# Patient Record
Sex: Male | Born: 1960 | Race: White | Hispanic: No | Marital: Single | Smoking: Never smoker
Health system: Southern US, Community
[De-identification: ages and names within clinical notes are randomized; demographics above are authoritative.]

## PROBLEM LIST (undated history)

## (undated) DIAGNOSIS — Z87442 Personal history of urinary calculi: Secondary | ICD-10-CM

## (undated) DIAGNOSIS — E785 Hyperlipidemia, unspecified: Secondary | ICD-10-CM

## (undated) DIAGNOSIS — R519 Headache, unspecified: Secondary | ICD-10-CM

## (undated) DIAGNOSIS — I219 Acute myocardial infarction, unspecified: Secondary | ICD-10-CM

## (undated) DIAGNOSIS — Z8249 Family history of ischemic heart disease and other diseases of the circulatory system: Secondary | ICD-10-CM

## (undated) DIAGNOSIS — E786 Lipoprotein deficiency: Secondary | ICD-10-CM

## (undated) DIAGNOSIS — Z9889 Other specified postprocedural states: Secondary | ICD-10-CM

## (undated) DIAGNOSIS — E119 Type 2 diabetes mellitus without complications: Secondary | ICD-10-CM

## (undated) DIAGNOSIS — I2510 Atherosclerotic heart disease of native coronary artery without angina pectoris: Secondary | ICD-10-CM

## (undated) DIAGNOSIS — R51 Headache: Secondary | ICD-10-CM

## (undated) DIAGNOSIS — R112 Nausea with vomiting, unspecified: Secondary | ICD-10-CM

## (undated) HISTORY — DX: Type 2 diabetes mellitus without complications: E11.9

## (undated) HISTORY — DX: Atherosclerotic heart disease of native coronary artery without angina pectoris: I25.10

## (undated) HISTORY — DX: Lipoprotein deficiency: E78.6

## (undated) HISTORY — PX: EYE SURGERY: SHX253

## (undated) HISTORY — PX: STOMACH SURGERY: SHX791

## (undated) HISTORY — DX: Acute myocardial infarction, unspecified: I21.9

---

## 2009-04-13 ENCOUNTER — Ambulatory Visit: Payer: Self-pay | Admitting: *Deleted

## 2012-11-05 ENCOUNTER — Ambulatory Visit: Payer: Self-pay | Admitting: Sports Medicine

## 2012-12-10 ENCOUNTER — Ambulatory Visit (INDEPENDENT_AMBULATORY_CARE_PROVIDER_SITE_OTHER): Payer: BC Managed Care – PPO | Admitting: Sports Medicine

## 2012-12-10 ENCOUNTER — Encounter: Payer: Self-pay | Admitting: Sports Medicine

## 2012-12-10 VITALS — BP 137/91 | HR 90 | Ht 71.0 in | Wt 250.0 lb

## 2012-12-10 DIAGNOSIS — M722 Plantar fascial fibromatosis: Secondary | ICD-10-CM | POA: Insufficient documentation

## 2012-12-10 DIAGNOSIS — M25571 Pain in right ankle and joints of right foot: Secondary | ICD-10-CM

## 2012-12-10 DIAGNOSIS — M25579 Pain in unspecified ankle and joints of unspecified foot: Secondary | ICD-10-CM

## 2012-12-10 NOTE — Assessment & Plan Note (Signed)
Right plantar fasciitis.  Plan: Massage, stretches Eccentric heel lifts Arch straps Heel pads Followup 4-6 weeks

## 2012-12-10 NOTE — Progress Notes (Signed)
Seth Riddle is a 52 y.o. male who presents to [facility] today for right foot heel pain for the last month or so. Patient notes that he stepped out of his truck landing awkwardly on his foot over a month ago. He denies any significant immediate pain or swelling however a week or so later he had gradual onset of pain at the medial aspect of his plantar calcaneus of his right foot. Additionally  around the same time he started a couch to 5K program.  He notes pain in the plantar calcaneus especially when he gets out of bed the morning and when he starts running. His pain improves with activity and worsens with rest. He denies any radiating pain weakness or numbness. He has tried some cushioned sandals which have helped some.    PMH reviewed.  History  Substance Use Topics  . Smoking status: Never Smoker   . Smokeless tobacco: Never Used  . Alcohol Use: Not on file   ROS as above otherwise neg   Exam:  BP 137/91  Pulse 90  Ht 5\' 11"  (1.803 m)  Wt 250 lb (113.399 kg)  BMI 34.88 kg/m2 Gen: Well NAD MSK: Right foot: Very wide with pes planus and ankle pronation with standing.  Right foot the third toe dominant Normal heel varus with toe standing Tender to palpation over the medial aspect of the plantar calcaneus Sensation and capillary refill are intact distally

## 2012-12-10 NOTE — Patient Instructions (Addendum)
Thank you for coming in today. I think you have plantar fasciitis Wear the arch straps with exercise and heel cups with exercise. Do the pull the toe back massage stretch for 10-15 minutes daily Ice the heel for 10-20 minutes daily Heel lifts 30 reps 2-3 sets daily.   Return in 4-6 weeks.

## 2013-09-10 ENCOUNTER — Ambulatory Visit: Payer: Self-pay | Admitting: Gastroenterology

## 2013-09-14 LAB — PATHOLOGY REPORT

## 2014-05-17 ENCOUNTER — Ambulatory Visit: Payer: BC Managed Care – PPO | Admitting: Sports Medicine

## 2014-08-19 DIAGNOSIS — I219 Acute myocardial infarction, unspecified: Secondary | ICD-10-CM

## 2014-08-19 HISTORY — DX: Acute myocardial infarction, unspecified: I21.9

## 2014-11-13 ENCOUNTER — Encounter (HOSPITAL_COMMUNITY): Payer: Self-pay | Admitting: Physical Medicine and Rehabilitation

## 2014-11-13 ENCOUNTER — Inpatient Hospital Stay (HOSPITAL_COMMUNITY)
Admission: EM | Admit: 2014-11-13 | Discharge: 2014-11-21 | DRG: 232 | Disposition: A | Discharge status: Home / Self Care | Payer: BC Managed Care – PPO | Attending: Thoracic Surgery (Cardiothoracic Vascular Surgery) | Admitting: Thoracic Surgery (Cardiothoracic Vascular Surgery)

## 2014-11-13 ENCOUNTER — Emergency Department (HOSPITAL_COMMUNITY): Payer: BC Managed Care – PPO

## 2014-11-13 DIAGNOSIS — Z91018 Allergy to other foods: Secondary | ICD-10-CM | POA: Diagnosis not present

## 2014-11-13 DIAGNOSIS — I2510 Atherosclerotic heart disease of native coronary artery without angina pectoris: Secondary | ICD-10-CM | POA: Diagnosis not present

## 2014-11-13 DIAGNOSIS — I2109 ST elevation (STEMI) myocardial infarction involving other coronary artery of anterior wall: Principal | ICD-10-CM | POA: Diagnosis present

## 2014-11-13 DIAGNOSIS — J811 Chronic pulmonary edema: Secondary | ICD-10-CM | POA: Diagnosis not present

## 2014-11-13 DIAGNOSIS — E784 Other hyperlipidemia: Secondary | ICD-10-CM | POA: Diagnosis not present

## 2014-11-13 DIAGNOSIS — E785 Hyperlipidemia, unspecified: Secondary | ICD-10-CM | POA: Diagnosis present

## 2014-11-13 DIAGNOSIS — E8770 Fluid overload, unspecified: Secondary | ICD-10-CM | POA: Diagnosis present

## 2014-11-13 DIAGNOSIS — J9811 Atelectasis: Secondary | ICD-10-CM | POA: Diagnosis not present

## 2014-11-13 DIAGNOSIS — Z951 Presence of aortocoronary bypass graft: Secondary | ICD-10-CM

## 2014-11-13 DIAGNOSIS — I25110 Atherosclerotic heart disease of native coronary artery with unstable angina pectoris: Secondary | ICD-10-CM | POA: Diagnosis not present

## 2014-11-13 DIAGNOSIS — I2102 ST elevation (STEMI) myocardial infarction involving left anterior descending coronary artery: Secondary | ICD-10-CM | POA: Insufficient documentation

## 2014-11-13 DIAGNOSIS — D696 Thrombocytopenia, unspecified: Secondary | ICD-10-CM | POA: Diagnosis not present

## 2014-11-13 DIAGNOSIS — R079 Chest pain, unspecified: Secondary | ICD-10-CM | POA: Diagnosis present

## 2014-11-13 DIAGNOSIS — Z978 Presence of other specified devices: Secondary | ICD-10-CM

## 2014-11-13 DIAGNOSIS — I214 Non-ST elevation (NSTEMI) myocardial infarction: Secondary | ICD-10-CM | POA: Diagnosis present

## 2014-11-13 DIAGNOSIS — I255 Ischemic cardiomyopathy: Secondary | ICD-10-CM | POA: Diagnosis present

## 2014-11-13 DIAGNOSIS — E1169 Type 2 diabetes mellitus with other specified complication: Secondary | ICD-10-CM | POA: Diagnosis present

## 2014-11-13 DIAGNOSIS — E119 Type 2 diabetes mellitus without complications: Secondary | ICD-10-CM | POA: Diagnosis present

## 2014-11-13 DIAGNOSIS — Z8249 Family history of ischemic heart disease and other diseases of the circulatory system: Secondary | ICD-10-CM

## 2014-11-13 DIAGNOSIS — Z4682 Encounter for fitting and adjustment of non-vascular catheter: Secondary | ICD-10-CM

## 2014-11-13 DIAGNOSIS — Z566 Other physical and mental strain related to work: Secondary | ICD-10-CM

## 2014-11-13 HISTORY — DX: Hyperlipidemia, unspecified: E78.5

## 2014-11-13 HISTORY — DX: Family history of ischemic heart disease and other diseases of the circulatory system: Z82.49

## 2014-11-13 LAB — D-DIMER, QUANTITATIVE: D-Dimer, Quant: 0.27 ug/mL-FEU (ref 0.00–0.48)

## 2014-11-13 LAB — TROPONIN I
TROPONIN I: 1.92 ng/mL — AB (ref <0.031)
Troponin I: 0.19 ng/mL — ABNORMAL HIGH (ref <0.031)
Troponin I: 2.35 ng/mL (ref <0.031)

## 2014-11-13 LAB — HEPARIN LEVEL (UNFRACTIONATED): Heparin Unfractionated: 0.21 IU/mL — ABNORMAL LOW (ref 0.30–0.70)

## 2014-11-13 LAB — I-STAT TROPONIN, ED: TROPONIN I, POC: 0.16 ng/mL — AB (ref 0.00–0.08)

## 2014-11-13 LAB — CBC
HEMATOCRIT: 46 % (ref 39.0–52.0)
Hemoglobin: 16 g/dL (ref 13.0–17.0)
MCH: 30.9 pg (ref 26.0–34.0)
MCHC: 34.8 g/dL (ref 30.0–36.0)
MCV: 89 fL (ref 78.0–100.0)
Platelets: 172 10*3/uL (ref 150–400)
RBC: 5.17 MIL/uL (ref 4.22–5.81)
RDW: 12.7 % (ref 11.5–15.5)
WBC: 7.2 10*3/uL (ref 4.0–10.5)

## 2014-11-13 LAB — BASIC METABOLIC PANEL
Anion gap: 9 (ref 5–15)
BUN: 12 mg/dL (ref 6–23)
CHLORIDE: 106 mmol/L (ref 96–112)
CO2: 23 mmol/L (ref 19–32)
CREATININE: 1.13 mg/dL (ref 0.50–1.35)
Calcium: 9 mg/dL (ref 8.4–10.5)
GFR calc Af Amer: 83 mL/min — ABNORMAL LOW (ref >90)
GFR calc non Af Amer: 72 mL/min — ABNORMAL LOW (ref >90)
GLUCOSE: 147 mg/dL — AB (ref 70–99)
POTASSIUM: 4.1 mmol/L (ref 3.5–5.1)
SODIUM: 138 mmol/L (ref 135–145)

## 2014-11-13 LAB — MRSA PCR SCREENING: MRSA BY PCR: NEGATIVE

## 2014-11-13 LAB — SALICYLATE LEVEL: Salicylate Lvl: <4 mg/dL (ref 2.8–20.0)

## 2014-11-13 LAB — PROTIME-INR
INR: 1.07 (ref 0.00–1.49)
Prothrombin Time: 14 seconds (ref 11.6–15.2)

## 2014-11-13 LAB — GLUCOSE, CAPILLARY: Glucose-Capillary: 138 mg/dL — ABNORMAL HIGH (ref 70–99)

## 2014-11-13 MED ORDER — ONDANSETRON HCL 4 MG/2ML IJ SOLN
4.0000 mg | Freq: Four times a day (QID) | INTRAMUSCULAR | Status: DC | PRN
Start: 2014-11-13 — End: 2014-11-14

## 2014-11-13 MED ORDER — SODIUM CHLORIDE 0.9 % IJ SOLN: 3.0000 mL | INTRAMUSCULAR | Status: DC | PRN

## 2014-11-13 MED ORDER — NITROGLYCERIN 0.4 MG SL SUBL: 0.4000 mg | SUBLINGUAL_TABLET | SUBLINGUAL | Status: DC | PRN

## 2014-11-13 MED ORDER — SODIUM CHLORIDE 0.9 % IV SOLN: 250.0000 mL | INTRAVENOUS | Status: DC | PRN

## 2014-11-13 MED ORDER — SODIUM CHLORIDE 0.9 % IV SOLN
1.0000 mL/kg/h | INTRAVENOUS | Status: DC
  Administered 2014-11-14 (×2): 1 mL/kg/h via INTRAVENOUS

## 2014-11-13 MED ORDER — ATORVASTATIN CALCIUM 80 MG PO TABS
80.0000 mg | ORAL_TABLET | Freq: Every day | ORAL | Status: DC
  Administered 2014-11-13: 80 mg via ORAL
  Filled 2014-11-13: qty 1

## 2014-11-13 MED ORDER — ACETAMINOPHEN 325 MG PO TABS
650.0000 mg | ORAL_TABLET | ORAL | Status: DC | PRN
  Administered 2014-11-14 (×2): 650 mg via ORAL
  Filled 2014-11-13 (×2): qty 2

## 2014-11-13 MED ORDER — SODIUM CHLORIDE 0.9 % IV SOLN
250.0000 mL | INTRAVENOUS | Status: DC | PRN
Start: 2014-11-13 — End: 2014-11-14

## 2014-11-13 MED ORDER — HEPARIN BOLUS VIA INFUSION
4000.0000 [IU] | Freq: Once | INTRAVENOUS | Status: AC
  Administered 2014-11-13: 4000 [IU] via INTRAVENOUS
  Filled 2014-11-13: qty 4000

## 2014-11-13 MED ORDER — ASPIRIN 300 MG RE SUPP: 300.0000 mg | RECTAL | Status: AC

## 2014-11-13 MED ORDER — TEMAZEPAM 15 MG PO CAPS
15.0000 mg | ORAL_CAPSULE | Freq: Every evening | ORAL | Status: DC | PRN
  Administered 2014-11-13 – 2014-11-20 (×6): 15 mg via ORAL
  Filled 2014-11-13 (×6): qty 1

## 2014-11-13 MED ORDER — HEPARIN BOLUS VIA INFUSION
1500.0000 [IU] | Freq: Once | INTRAVENOUS | Status: AC
  Administered 2014-11-14: 1500 [IU] via INTRAVENOUS
  Filled 2014-11-13: qty 1500

## 2014-11-13 MED ORDER — HEPARIN (PORCINE) IN NACL 100-0.45 UNIT/ML-% IJ SOLN
1400.0000 [IU]/h | INTRAMUSCULAR | Status: DC
  Administered 2014-11-13: 1400 [IU]/h via INTRAVENOUS
  Filled 2014-11-13: qty 250

## 2014-11-13 MED ORDER — ASPIRIN EC 81 MG PO TBEC: 81.0000 mg | DELAYED_RELEASE_TABLET | Freq: Every day | ORAL | Status: DC

## 2014-11-13 MED ORDER — NITROGLYCERIN 0.4 MG SL SUBL
0.4000 mg | SUBLINGUAL_TABLET | SUBLINGUAL | Status: DC | PRN
  Administered 2014-11-14 (×2): 0.4 mg via SUBLINGUAL
  Filled 2014-11-13: qty 1

## 2014-11-13 MED ORDER — METOPROLOL TARTRATE 12.5 MG HALF TABLET
12.5000 mg | ORAL_TABLET | Freq: Two times a day (BID) | ORAL | Status: DC
  Administered 2014-11-13 – 2014-11-14 (×3): 12.5 mg via ORAL
  Filled 2014-11-13 (×3): qty 1

## 2014-11-13 MED ORDER — SODIUM CHLORIDE 0.9 % IJ SOLN: 3.0000 mL | Freq: Two times a day (BID) | INTRAMUSCULAR | Status: DC

## 2014-11-13 MED ORDER — SODIUM CHLORIDE 0.9 % IJ SOLN
3.0000 mL | Freq: Two times a day (BID) | INTRAMUSCULAR | Status: DC
  Administered 2014-11-14: 3 mL via INTRAVENOUS

## 2014-11-13 MED ORDER — HEPARIN (PORCINE) IN NACL 100-0.45 UNIT/ML-% IJ SOLN
1600.0000 [IU]/h | INTRAMUSCULAR | Status: DC
  Administered 2014-11-14 (×2): 1600 [IU]/h via INTRAVENOUS
  Filled 2014-11-13: qty 250

## 2014-11-13 MED ORDER — ASPIRIN 81 MG PO CHEW: 324.0000 mg | CHEWABLE_TABLET | ORAL | Status: AC

## 2014-11-13 MED ORDER — ASPIRIN 81 MG PO CHEW
81.0000 mg | CHEWABLE_TABLET | ORAL | Status: AC
  Administered 2014-11-14: 81 mg via ORAL
  Filled 2014-11-13: qty 1

## 2014-11-13 NOTE — ED Provider Notes (Signed)
CSN: [id number]     Arrival date & time 11/13/14  0957 History   First MD Initiated Contact with Patient 11/13/14 1010     Chief Complaint  Patient presents with  . Chest Pain     (Consider location/radiation/quality/duration/timing/severity/associated sxs/prior Treatment) HPI Comments: RAMESES OU is a 54 y.o. Previously healthy male who presents to the ED with complaints of sudden onset midsternal chest pain that began at 5 AM, awakening him from sleep. He states the pain was initially 7/10 midsternal pressure-like pain that radiated to his left jaw and left arm, constant until he took a total of 655 mg of aspirin and an antacid medication which has improved his symptoms significantly, now down to a 1/10. No known aggravating factors, denies any change with inspiration, movement, or exertion. Associated symptoms include a diaphoretic feeling (patient states he felt "warm and flushed" but denies sweating), as well as left arm tingling which is also since resolved. He reports he has been belching, but denies any indigestion or heartburn, or dysphagia. He denies any recent fevers, chills, shortness of breath, cough, wheezing, hemoptysis, leg swelling, surgery, mobilization, personal history of DVT/PE, abdominal pain, nausea, vomiting, diarrhea, constipation, dysuria, hematuria, melena, hematochezia, back pain, weakness, numbness, PND, claudication, orthopnea, dizziness, or lightheadedness. patient reports a strong family history of cardiac illness, including multiple MIs in varying close family members including parents, grandparents, and siblings. He states he was in [facility] one week ago and had similar chest pain, but did not seek medical attention. He went there via airplane, stating that he was on a plane for 5.5 hours. Denies any IV drug use or ED medications.   Patient is a 54 y.o. male presenting with chest pain. The history is provided by the patient. No language interpreter was used.   Chest Pain Pain location:  Substernal area Pain quality: pressure   Pain radiates to:  L jaw and L arm Pain radiates to the back: no   Pain severity:  Moderate (7/10 at onset, 1/10 now) Onset quality:  Sudden Duration:  5 hours Timing:  Constant Progression:  Partially resolved Chronicity:  Recurrent (one similar episode last week) Context: at rest   Relieved by:  Aspirin and antacids (  ASA total) Worsened by:  Nothing tried Ineffective treatments:  None tried Associated symptoms: diaphoresis (felt warm/flushed)   Associated symptoms: no abdominal pain, no altered mental status, no anxiety, no back pain, no claudication, no cough, no dizziness, no dysphagia, no fever, no heartburn, no lower extremity edema, no nausea, no near-syncope, no numbness, no orthopnea, no palpitations, no PND, no shortness of breath, no syncope, not vomiting and no weakness   Risk factors: male sex   Risk factors: no immobilization, no prior DVT/PE, no smoking and no surgery     History reviewed. No pertinent past medical history. History reviewed. No pertinent past surgical history. No family history on file. History  Substance Use Topics  . Smoking status: Never Smoker   . Smokeless tobacco: Never Used  . Alcohol Use: Yes    Review of Systems  Constitutional: Positive for diaphoresis (felt warm/flushed). Negative for fever and chills.  HENT: Negative for trouble swallowing.   Respiratory: Negative for cough, shortness of breath and wheezing.   Cardiovascular: Positive for chest pain. Negative for palpitations, orthopnea, claudication, leg swelling, syncope, PND and near-syncope.  Gastrointestinal: Negative for heartburn, nausea, vomiting, abdominal pain, diarrhea, constipation and blood in stool.       +belching  Genitourinary: Negative for  dysuria and hematuria.  Musculoskeletal: Positive for neck pain (radiating from chest). Negative for myalgias, back pain and arthralgias.  Skin: Negative  for color change.  Neurological: Negative for dizziness, weakness, light-headedness and numbness.       +L arm tingling  Psychiatric/Behavioral: Negative for confusion.   10 Systems reviewed and are negative for acute change except as noted in the HPI.    Allergies  Review of patient's allergies indicates no known allergies.  Home Medications   Prior to Admission medications   Not on File   BP 135/82 mmHg  Pulse 80  Temp(Src) 97.6 F (36.4 C) (Oral)  Resp 18  Ht 5\' 11"  (1.803 m)  Wt 260 lb (117.935 kg)  BMI 36.28 kg/m2  SpO2 96% Physical Exam  Constitutional: He is oriented to person, place, and time. Vital signs are normal. He appears well-developed and well-nourished.  Non-toxic appearance. No distress.  Afebrile, nontoxic, NAD  HENT:  Head: Normocephalic and atraumatic.  Mouth/Throat: Oropharynx is clear and moist and mucous membranes are normal.  Eyes: Conjunctivae and EOM are normal. Right eye exhibits no discharge. Left eye exhibits no discharge.  Neck: Normal range of motion. Neck supple. No JVD present.  No JVD  Cardiovascular: Normal rate, regular rhythm, normal heart sounds and intact distal pulses.  Exam reveals no gallop and no friction rub.   No murmur heard. RRR, nl s1/s2, no m/r/g, distal pulses intact and symmetric, no pedal edema   Pulmonary/Chest: Effort normal and breath sounds normal. No respiratory distress. He has no decreased breath sounds. He has no wheezes. He has no rhonchi. He has no rales. He exhibits no tenderness, no crepitus and no retraction.  CTAB in all lung fields, no w/r/r, no hypoxia or increased WOB, speaking in full sentences, SpO2 92-96% on RA during exam No chest wall TTP or crepitus/retraction  Abdominal: Soft. Normal appearance and bowel sounds are normal. He exhibits no distension. There is no tenderness. There is no rigidity, no rebound, no guarding, no CVA tenderness, no tenderness at McBurney's point and negative Murphy's sign.   Musculoskeletal: Normal range of motion.  MAE x4 Strength and sensation grossly intact Distal pulses symmetric and intact No pedal edema, neg homan's sign bilaterally  Neurological: He is alert and oriented to person, place, and time. He has normal strength. No sensory deficit.  Skin: Skin is warm, dry and intact. No rash noted.  Psychiatric: He has a normal mood and affect.  Nursing note and vitals reviewed.   ED Course  Procedures (including critical care time)  CRITICAL CARE- NSTEMI Performed by: Ramond Marrow   Total critical care time: 30 minutes  Critical care time was exclusive of separately billable procedures and treating other patients.  Critical care was necessary to treat or prevent imminent or life-threatening deterioration.  Critical care was time spent personally by me on the following activities: development of treatment plan with patient and/or surrogate as well as nursing, discussions with consultants, evaluation of patient's response to treatment, examination of patient, obtaining history from patient or surrogate, ordering and performing treatments and interventions, ordering and review of laboratory studies, ordering and review of radiographic studies, pulse oximetry and re-evaluation of patient's condition.   Labs Review Labs Reviewed  BASIC METABOLIC PANEL - Abnormal; Notable for the following:    Glucose, Bld 147 (*)    GFR calc non Af Amer 72 (*)    GFR calc Af Amer 83 (*)    All other components within normal  limits  TROPONIN I - Abnormal; Notable for the following:    Troponin I 0.19 (*)    All other components within normal limits  I-STAT TROPOININ, ED - Abnormal; Notable for the following:    Troponin i, poc 0.16 (*)    All other components within normal limits  CBC  D-DIMER, QUANTITATIVE  SALICYLATE LEVEL  TROPONIN I    Imaging Review Dg Chest 2 View  11/13/2014   CLINICAL DATA:  Midsternal chest pain radiating to the  left arm and jaw.  EXAM: CHEST  2 VIEW  COMPARISON:  None.  FINDINGS: Normal cardiac and mediastinal contours. No consolidative pulmonary opacities. No pleural effusion or pneumothorax. Mid thoracic spine degenerative changes.  IMPRESSION: No acute cardiopulmonary process.   Electronically Signed   By: Annia Belt M.D.   On: 11/13/2014 11:26     EKG Interpretation   Date/Time:  Sunday November 13 2014 10:04:19 EDT Ventricular Rate:  79 PR Interval:  149 QRS Duration: 90 QT Interval:  363 QTC Calculation: 416 R Axis:   40 Text Interpretation:  Sinus rhythm Low voltage, precordial leads Confirmed  by ZAVITZ  MD, JOSHUA ([id number]) on 11/13/2014 11:42:44 AM      MDM   Final diagnoses:  NSTEMI (non-ST elevated myocardial infarction)  Acute chest pain    54 y.o. male here with sudden onset CP that awoke him from sleep at 5am, he took  ASA total prior to arrival, which helped relieve his symptoms down to 1/10. Significant cardiac history in family. No reproducible pain on exam. Symmetric pulses. No tachycardia but during exam, SpO2 reveals fluctuations from 92%-96%, and pt recently travelled on an airplane for 5.5hrs, no calf tenderness or swelling, but low suspicion for PE. Will obtain dimer given low well's score, if positive will proceed with CTA. EKG showing NSR with low voltage leads, no T wave changes noted. Will obtain labs, CXR, and salicylate level. Will give NTG. Will hold off on morphine since pt states his pain is nearly resolved. Discussed w/pt that he will likely need admission for cardiac rule out given his suspicious story and significant family hx of cardiac disease, but pt is hesitant due to family obligations (travelling to [place] today to visit family). He agrees to stay for delta trop at least, and then reassess at that time, although I still strongly believe pt would benefit from admission. Will reassess shortly.   11:14 AM Istat troponin elevated, will get formal troponin  but will consult cardiology now.  11:38 AM Dr. Rennis Golden of cardiology returning page, will come evaluate patient. CBC returning WNL. CXR WNL. Awaiting BMP, Dimer, salicylate, and formal troponin I.  12:25 PM BMP with mildly elevated glucose at 147. Dimer neg. Salicylate level WNL. Troponin I 0.19. Went to update pt and cardiology at bedside. Will await their recommendations. Pt stable at this time.   1:39 PM Cardiology has written a note but no admission orders placed, unclear of status of patient. Will have them repaged.  2:11 PM Cardiology to be repaged again now, still have not received call.  2:49 PM Dr. Rennis Golden returning page, states that all orders were under "sign and held" and had not yet been released, but that the pt is going to be admitted under the cardiology service. Admission order placed, please see cardiology services note for further documentation of care.  France Ravens Camprubi-Soms, PA-C 11/13/14 1450  Blane Ohara, MD 11/13/14 2207

## 2014-11-13 NOTE — H&P (Signed)
Physician History and Physical    Seth Riddle MRN: [id number] DOB/AGE: 1960/12/02 54 y.o. Admit date: 11/13/2014    HPI: The patient is a 54 yr old male with a past medical history of dyslipidemia and a family history of premature coronary artery disease. He was told his LDL is mildly elevated and his HDL is low. His mother had an MI and CABG at age 38 and his father had an MI as well. His father also has atrial fibrillation and had an ablation, and also had low HDL. His brother has atrial fibrillation requiring cardioversion.  He went to a movie with his wife last night and had popcorn. After he came home, he was belching. He went to bed and had a nightmare and slept 1.5 hours. He was then awoken by chest pain which was retrosternal and radiated into his jaw and left arm. He took 3 baby ASA and 3 antacid tablets which did not provide much relief. His wife says "He looked bad, looked like he was short of breath, and was bent over". The patient said he was just trying to take deep breaths to improve oxygenation. He denies associated nausea, palpitations, and lightheadedness.  Afterwards, he took a suitcase out to the car with continued symptoms, as he and his wife were planning on going to [facility]) to take her mother to a doctor's appointment.  He was in [facility] two weeks ago and had similar symptoms and he had also slept only 1.5 hours that night. He thought it was due to sleep deprivation and took a shower, felt better, and then walked 4-5 miles [place] convention [place] without difficulty.  He works in Occupational hygienist and stays stressed.  ECG demonstrates normal sinus rhythm and was without ischemic ST-T abnormalities.  I-stat troponin was 0.16 and initial serum troponin was 0.19. Chest xray, CBC, and d-dimer were normal.  Fam: See above (as per HPI). Parents both had MI's, mother had CABG at 33.  Soc: 3 children. Nonsmoker. Married. Works in Occupational hygienist. Grew up  with Delane Ginger.  Review of systems complete and found to be negative unless listed above   No family history of premature CAD in 1st degree relatives. History   Social History  . Marital Status: Married    Spouse Name: N/A  . Number of Children: N/A  . Years of Education: N/A   Occupational History  . Not on file.   Social History Main Topics  . Smoking status: Never Smoker   . Smokeless tobacco: Never Used  . Alcohol Use: Yes  . Drug Use: No  . Sexual Activity: Not on file   Other Topics Concern  . Not on file   Social History Narrative      (Not in a hospital admission)  Physical Exam: Blood pressure 135/82, pulse 80, temperature 97.6 F (36.4 C), temperature source Oral, resp. rate 18, height  (1.803 m), weight 260 lb (117.935 kg), SpO2 96 %.  General: NAD Neck: No JVD, no thyromegaly or thyroid nodule.  Lungs: Clear to auscultation bilaterally with normal respiratory effort. CV: Nondisplaced PMI.  Heart regular S1/S2, no S3/S4, no murmur.  No peripheral edema.  No carotid bruit.  Normal pedal pulses.  Abdomen: Soft, nontender, no hepatosplenomegaly, no distention.  Skin: Intact without lesions or rashes.  Neurologic: Alert and oriented x 3.  Psych: Normal affect. Extremities: No clubbing or cyanosis.  HEENT: Normal.   Labs:   Lab Results  Component  Value Date   WBC 7.2 11/13/2014   HGB 16.0 11/13/2014   HCT 46.0 11/13/2014   MCV 89.0 11/13/2014   PLT 172 11/13/2014    Recent Labs Lab 11/13/14 1045  NA 138  K 4.1  CL 106  CO2 23  BUN 12  CREATININE 1.13  CALCIUM 9.0  GLUCOSE 147*   Lab Results  Component Value Date   TROPONINI 0.19* 11/13/2014   No results found for: CHOL No results found for: HDL No results found for: LDLCALC No results found for: TRIG No results found for: CHOLHDL No results found for: LDLDIRECT        ASSESSMENT AND PLAN:  1. Chest pain with troponin elevation: Symptoms are very suspicious for ischemic  coronary artery disease and an evolving NSTEMI, particularly in the context of familial low HDL and premature CAD. I will start ASA, statin, heparin, and beta blocker. Will obtain an echocardiogram to evaluate left ventricular function and regional wall motion. Will obtain serial troponins. If enzymes were to normalize, one could consider stress testing. However, if they continue to rise and/or he were to have recurrent symptoms, I would have a low threshold for proceeding with coronary angiography. 2. Dyslipidemia: Will obtain a lipid panel.  Dispo: While the patient is eager to go home as a trip had been planned, I informed him about the risks of leaving prematurely as he may be having an evolving acute coronary syndrome. His wife has persuaded him to stay and undergo further evaluation and he is agreeable.  Signed: Prentice Docker, M.D., F.A.C.C. 11/13/2014, 12:49 PM

## 2014-11-13 NOTE — Progress Notes (Signed)
    Troponin trending upwards 0.19--> 1.92. Will place on cath board for tomorrow.    Cline Crock PA-C  [facility]

## 2014-11-13 NOTE — Progress Notes (Signed)
CRITICAL VALUE ALERT  Critical value received: Troponin: 1.92  Date of notification:  11/13/2014  Time of notification:  1620  Critical value read back:Yes.    Nurse who received alert:  Albina Billet  MD notified (1st page):  Carlean Jews, [place]   Time of first page:  1620  MD notified (2nd page):  Time of second page:  Responding MD:  Carlean Jews, PA  Time MD responded:  [phone]

## 2014-11-13 NOTE — Progress Notes (Signed)
ANTICOAGULATION CONSULT NOTE -Follow up  Pharmacy Consult for Heparin Indication: chest pain/ACS  Allergies  Allergen Reactions  . Coconut Oil Hives    Patient Measurements: Height: 5\' 11"  (180.3 cm) Weight: 274 lb 8 oz (124.512 kg) IBW/kg (Calculated) : 75.3 Heparin Dosing Weight: 101 kg  Vital Signs: Temp: 98.2 F (36.8 C) (03/27 2121) Temp Source: Oral (03/27 2121) BP: 137/94 mmHg (03/27 2119) Pulse Rate: 89 (03/27 2121)  Labs:  Recent Labs  11/13/14 1045 11/13/14 1500 11/13/14 1718 11/13/14 2028 11/13/14 2256  HGB 16.0  --   --   --   --   HCT 46.0  --   --   --   --   PLT 172  --   --   --   --   LABPROT  --   --  14.0  --   --   INR  --   --  1.07  --   --   HEPARINUNFRC  --   --   --   --  0.21*  CREATININE 1.13  --   --   --   --   TROPONINI 0.19* 1.92*  --  2.35*  --     Estimated Creatinine Clearance: 100.4 mL/min (by C-G formula based on Cr of 1.13).   Medical History: History reviewed. No pertinent past medical history.  Medications:  Infusions:  . [START ON 11/14/2014] sodium chloride    . heparin 1,400 Units/hr (11/13/14 1646)    Assessment: 54 year old male presenting with ACS to begin anticoagulation with Heparin. Note he is scheduled for cath on Monday 3/28.  First 6 hour heparin level is 0.21 on 1400 units/hr  Goal of Therapy:  Heparin level 0.3-0.7 units/ml Monitor platelets by anticoagulation protocol: Yes   Plan:  Heparin 1500 units IV bolus Increase Heparin infusion to 1600 units/hr Check heparin level in 6 hours Daily heparin level and CBC   Noah Delaine, RPh Clinical Pharmacist Pager: [phone] 11/13/2014, 11:34 PM

## 2014-11-13 NOTE — ED Notes (Signed)
Pt presents to department for evaluation of midsternal chest pressure radiating to L arm and jaw. Onset 5am this morning. 1/10 chest pressure upon arrival to ED. Respirations unlabored. Pt is alert and oriented x4.

## 2014-11-13 NOTE — Progress Notes (Signed)
ANTICOAGULATION CONSULT NOTE - Initial Consult  Pharmacy Consult for Heparin Indication: chest pain/ACS  Allergies  Allergen Reactions  . Coconut Oil Hives    Patient Measurements: Height: 5\' 11"  (180.3 cm) Weight: 260 lb (117.935 kg) IBW/kg (Calculated) : 75.3 Heparin Dosing Weight: 101 kg  Vital Signs: Temp: 98.1 F (36.7 C) (03/27 1631) Temp Source: Oral (03/27 1631) BP: 123/83 mmHg (03/27 1631) Pulse Rate: 67 (03/27 1600)  Labs:  Recent Labs  11/13/14 1045 11/13/14 1500  HGB 16.0  --   HCT 46.0  --   PLT 172  --   CREATININE 1.13  --   TROPONINI 0.19* 1.92*    Estimated Creatinine Clearance: 97.6 mL/min (by C-G formula based on Cr of 1.13).   Medical History: History reviewed. No pertinent past medical history.  Medications:  Infusions:  . [START ON 11/14/2014] sodium chloride      Assessment: 54 year old male presenting with ACS to begin anticoagulation with Heparin. Note he is scheduled for cath on Monday 3/28.  Goal of Therapy:  Heparin level 0.3-0.7 units/ml Monitor platelets by anticoagulation protocol: Yes   Plan:  Heparin 4000 units IV bolus Start Heparin infusion at 1400 units/hr Check heparin level in 6 hours Daily heparin level and CBC  Estella Husk, Pharm.D., BCPS, AAHIVP Clinical Pharmacist Phone: [phone] or [phone] 11/13/2014, 4:35 PM

## 2014-11-13 NOTE — ED Notes (Signed)
Lab results reported to Dr.Zavitz. 

## 2014-11-13 NOTE — ED Notes (Signed)
Pt transported to xray 

## 2014-11-14 ENCOUNTER — Other Ambulatory Visit: Payer: Self-pay

## 2014-11-14 ENCOUNTER — Encounter (HOSPITAL_COMMUNITY): Payer: Self-pay | Admitting: Physician Assistant

## 2014-11-14 ENCOUNTER — Encounter (HOSPITAL_COMMUNITY)
Admission: EM | Disposition: A | Discharge status: Home / Self Care | Payer: Self-pay | Source: Home / Self Care | Attending: Thoracic Surgery (Cardiothoracic Vascular Surgery)

## 2014-11-14 DIAGNOSIS — Z8249 Family history of ischemic heart disease and other diseases of the circulatory system: Secondary | ICD-10-CM

## 2014-11-14 DIAGNOSIS — I2510 Atherosclerotic heart disease of native coronary artery without angina pectoris: Secondary | ICD-10-CM

## 2014-11-14 DIAGNOSIS — E785 Hyperlipidemia, unspecified: Secondary | ICD-10-CM | POA: Diagnosis present

## 2014-11-14 DIAGNOSIS — I25110 Atherosclerotic heart disease of native coronary artery with unstable angina pectoris: Secondary | ICD-10-CM

## 2014-11-14 DIAGNOSIS — E784 Other hyperlipidemia: Secondary | ICD-10-CM

## 2014-11-14 DIAGNOSIS — E1169 Type 2 diabetes mellitus with other specified complication: Secondary | ICD-10-CM | POA: Diagnosis present

## 2014-11-14 DIAGNOSIS — R079 Chest pain, unspecified: Secondary | ICD-10-CM

## 2014-11-14 HISTORY — PX: LEFT HEART CATHETERIZATION WITH CORONARY ANGIOGRAM: SHX5451

## 2014-11-14 LAB — COMPREHENSIVE METABOLIC PANEL
ALT: 74 U/L — ABNORMAL HIGH (ref 0–53)
AST: 55 U/L — AB (ref 0–37)
Albumin: 3.4 g/dL — ABNORMAL LOW (ref 3.5–5.2)
Alkaline Phosphatase: 78 U/L (ref 39–117)
Anion gap: 9 (ref 5–15)
BILIRUBIN TOTAL: 1 mg/dL (ref 0.3–1.2)
BUN: 13 mg/dL (ref 6–23)
CALCIUM: 9.3 mg/dL (ref 8.4–10.5)
CHLORIDE: 103 mmol/L (ref 96–112)
CO2: 26 mmol/L (ref 19–32)
Creatinine, Ser: 1.06 mg/dL (ref 0.50–1.35)
GFR calc Af Amer: >90 mL/min (ref >90)
GFR calc non Af Amer: 78 mL/min — ABNORMAL LOW (ref >90)
Glucose, Bld: 138 mg/dL — ABNORMAL HIGH (ref 70–99)
POTASSIUM: 3.8 mmol/L (ref 3.5–5.1)
Sodium: 138 mmol/L (ref 135–145)
Total Protein: 6.7 g/dL (ref 6.0–8.3)

## 2014-11-14 LAB — CBC
HEMATOCRIT: 45.4 % (ref 39.0–52.0)
HEMOGLOBIN: 15.5 g/dL (ref 13.0–17.0)
MCH: 30.2 pg (ref 26.0–34.0)
MCHC: 34.1 g/dL (ref 30.0–36.0)
MCV: 88.5 fL (ref 78.0–100.0)
Platelets: 166 10*3/uL (ref 150–400)
RBC: 5.13 MIL/uL (ref 4.22–5.81)
RDW: 12.7 % (ref 11.5–15.5)
WBC: 7.1 10*3/uL (ref 4.0–10.5)

## 2014-11-14 LAB — TYPE AND SCREEN
ABO/RH(D): O POS
Antibody Screen: NEGATIVE

## 2014-11-14 LAB — HEPARIN LEVEL (UNFRACTIONATED): Heparin Unfractionated: 0.47 IU/mL (ref 0.30–0.70)

## 2014-11-14 LAB — LIPID PANEL
CHOL/HDL RATIO: 7.3 ratio
CHOLESTEROL: 174 mg/dL (ref 0–200)
HDL: 24 mg/dL — ABNORMAL LOW (ref >39)
LDL Cholesterol: 116 mg/dL — ABNORMAL HIGH (ref 0–99)
Triglycerides: 168 mg/dL — ABNORMAL HIGH (ref <150)
VLDL: 34 mg/dL (ref 0–40)

## 2014-11-14 LAB — PLATELET INHIBITION P2Y12: Platelet Function  P2Y12: 225 [PRU] (ref 194–418)

## 2014-11-14 LAB — PROTIME-INR
INR: 1.09 (ref 0.00–1.49)
PROTHROMBIN TIME: 14.2 s (ref 11.6–15.2)

## 2014-11-14 LAB — ABO/RH: ABO/RH(D): O POS

## 2014-11-14 LAB — T4, FREE: Free T4: 0.96 ng/dL (ref 0.80–1.80)

## 2014-11-14 SURGERY — LEFT HEART CATHETERIZATION WITH CORONARY ANGIOGRAM
Anesthesia: LOCAL

## 2014-11-14 MED ORDER — CHLORHEXIDINE GLUCONATE CLOTH 2 % EX PADS: 6.0000 | MEDICATED_PAD | Freq: Once | CUTANEOUS | Status: DC

## 2014-11-14 MED ORDER — ACETAMINOPHEN 325 MG PO TABS: 650.0000 mg | ORAL_TABLET | ORAL | Status: DC | PRN

## 2014-11-14 MED ORDER — NITROGLYCERIN IN D5W 200-5 MCG/ML-% IV SOLN
2.0000 ug/min | INTRAVENOUS | Status: DC
  Administered 2014-11-14: 10 ug/min via INTRAVENOUS

## 2014-11-14 MED ORDER — METOPROLOL TARTRATE 12.5 MG HALF TABLET: 12.5000 mg | ORAL_TABLET | Freq: Once | ORAL | Status: DC

## 2014-11-14 MED ORDER — DEXTROSE 5 % IV SOLN
1.5000 g | INTRAVENOUS | Status: DC
  Administered 2014-11-15: 1.5 g via INTRAVENOUS
  Administered 2014-11-15: .75 g via INTRAVENOUS
  Filled 2014-11-14: qty 1.5

## 2014-11-14 MED ORDER — MAGNESIUM SULFATE 50 % IJ SOLN
40.0000 meq | INTRAMUSCULAR | Status: DC
  Filled 2014-11-14: qty 10

## 2014-11-14 MED ORDER — HEPARIN (PORCINE) IN NACL 100-0.45 UNIT/ML-% IJ SOLN
1600.0000 [IU]/h | INTRAMUSCULAR | Status: DC
  Administered 2014-11-15: 1600 [IU]/h via INTRAVENOUS
  Filled 2014-11-14: qty 250

## 2014-11-14 MED ORDER — POTASSIUM CHLORIDE 2 MEQ/ML IV SOLN
80.0000 meq | INTRAVENOUS | Status: DC
  Filled 2014-11-14: qty 40

## 2014-11-14 MED ORDER — MIDAZOLAM HCL 2 MG/2ML IJ SOLN
INTRAMUSCULAR | Status: AC
  Filled 2014-11-14: qty 2

## 2014-11-14 MED ORDER — INSULIN REGULAR HUMAN 100 UNIT/ML IJ SOLN
INTRAMUSCULAR | Status: DC
  Administered 2014-11-15: 1 [IU]/h via INTRAVENOUS
  Filled 2014-11-14: qty 2.5

## 2014-11-14 MED ORDER — ATORVASTATIN CALCIUM 80 MG PO TABS
80.0000 mg | ORAL_TABLET | Freq: Every day | ORAL | Status: DC
  Administered 2014-11-14 – 2014-11-20 (×6): 80 mg via ORAL
  Filled 2014-11-14 (×9): qty 1

## 2014-11-14 MED ORDER — ONDANSETRON HCL 4 MG/2ML IJ SOLN: 4.0000 mg | Freq: Four times a day (QID) | INTRAMUSCULAR | Status: DC | PRN

## 2014-11-14 MED ORDER — ASPIRIN EC 81 MG PO TBEC: 81.0000 mg | DELAYED_RELEASE_TABLET | Freq: Every day | ORAL | Status: DC

## 2014-11-14 MED ORDER — LORAZEPAM 2 MG/ML IJ SOLN
INTRAMUSCULAR | Status: AC
  Filled 2014-11-14: qty 1

## 2014-11-14 MED ORDER — NITROGLYCERIN IN D5W 200-5 MCG/ML-% IV SOLN
2.0000 ug/min | INTRAVENOUS | Status: DC
  Administered 2014-11-15: 30 ug/min via INTRAVENOUS
  Filled 2014-11-14 (×2): qty 250

## 2014-11-14 MED ORDER — MORPHINE SULFATE 4 MG/ML IJ SOLN
4.0000 mg | INTRAMUSCULAR | Status: DC | PRN
  Administered 2014-11-14 – 2014-11-15 (×2): 2 mg via INTRAVENOUS
  Filled 2014-11-14 (×2): qty 1

## 2014-11-14 MED ORDER — FENTANYL CITRATE 0.05 MG/ML IJ SOLN
INTRAMUSCULAR | Status: AC
  Filled 2014-11-14: qty 2

## 2014-11-14 MED ORDER — HEPARIN SODIUM (PORCINE) 1000 UNIT/ML IJ SOLN
INTRAMUSCULAR | Status: AC
Start: 2014-11-14 — End: 2014-11-14
  Filled 2014-11-14: qty 1

## 2014-11-14 MED ORDER — EPINEPHRINE HCL 1 MG/ML IJ SOLN
0.0000 ug/min | INTRAVENOUS | Status: DC
  Filled 2014-11-14: qty 4

## 2014-11-14 MED ORDER — DEXTROSE 5 % IV SOLN
750.0000 mg | INTRAVENOUS | Status: DC
  Filled 2014-11-14: qty 750

## 2014-11-14 MED ORDER — TEMAZEPAM 15 MG PO CAPS: 15.0000 mg | ORAL_CAPSULE | Freq: Once | ORAL | Status: DC | PRN

## 2014-11-14 MED ORDER — DEXMEDETOMIDINE HCL IN NACL 400 MCG/100ML IV SOLN
0.1000 ug/kg/h | INTRAVENOUS | Status: DC
  Administered 2014-11-15: 0.2 ug/kg/h via INTRAVENOUS
  Filled 2014-11-14: qty 100

## 2014-11-14 MED ORDER — LIDOCAINE HCL (PF) 1 % IJ SOLN
INTRAMUSCULAR | Status: AC
  Filled 2014-11-14: qty 30

## 2014-11-14 MED ORDER — SODIUM CHLORIDE 0.9 % IV SOLN
INTRAVENOUS | Status: DC
  Filled 2014-11-14: qty 30

## 2014-11-14 MED ORDER — PLASMA-LYTE 148 IV SOLN
INTRAVENOUS | Status: DC
  Filled 2014-11-14: qty 2.5

## 2014-11-14 MED ORDER — BISACODYL 5 MG PO TBEC
5.0000 mg | DELAYED_RELEASE_TABLET | Freq: Once | ORAL | Status: AC
  Administered 2014-11-14: 5 mg via ORAL
  Filled 2014-11-14: qty 1

## 2014-11-14 MED ORDER — VANCOMYCIN HCL 10 G IV SOLR
1500.0000 mg | INTRAVENOUS | Status: DC
  Administered 2014-11-15: 1500 mg via INTRAVENOUS
  Filled 2014-11-14: qty 1500

## 2014-11-14 MED ORDER — PHENYLEPHRINE HCL 10 MG/ML IJ SOLN
30.0000 ug/min | INTRAVENOUS | Status: DC
  Administered 2014-11-15: 5 ug/min via INTRAVENOUS
  Filled 2014-11-14: qty 2

## 2014-11-14 MED ORDER — SODIUM CHLORIDE 0.9 % IV SOLN
INTRAVENOUS | Status: DC
  Administered 2014-11-15: 69.8 mL/h via INTRAVENOUS
  Filled 2014-11-14: qty 40

## 2014-11-14 MED ORDER — LORAZEPAM 2 MG/ML IJ SOLN
1.0000 mg | INTRAMUSCULAR | Status: DC | PRN
  Administered 2014-11-14: 1 mg via INTRAVENOUS

## 2014-11-14 MED ORDER — NITROGLYCERIN 1 MG/10 ML FOR IR/CATH LAB
INTRA_ARTERIAL | Status: AC
  Filled 2014-11-14: qty 10

## 2014-11-14 MED ORDER — VERAPAMIL HCL 2.5 MG/ML IV SOLN
INTRAVENOUS | Status: AC
  Filled 2014-11-14: qty 2

## 2014-11-14 MED ORDER — HEPARIN (PORCINE) IN NACL 2-0.9 UNIT/ML-% IJ SOLN
INTRAMUSCULAR | Status: AC
  Filled 2014-11-14: qty 1000

## 2014-11-14 MED ORDER — DOPAMINE-DEXTROSE 3.2-5 MG/ML-% IV SOLN
0.0000 ug/kg/min | INTRAVENOUS | Status: DC
  Filled 2014-11-14: qty 250

## 2014-11-14 NOTE — Progress Notes (Signed)
*  PRELIMINARY RESULTS* Echocardiogram 2D Echocardiogram has been performed.  Seth Riddle 11/14/2014, 11:08 AM

## 2014-11-14 NOTE — Progress Notes (Signed)
ANTICOAGULATION CONSULT NOTE - Follow Up Consult  Pharmacy Consult for heparin Indication: chest pain/ACS   Labs:  Recent Labs  11/13/14 1045 11/13/14 1500 11/13/14 1718 11/13/14 2028 11/13/14 2256 11/14/14 0350 11/14/14 0638  HGB 16.0  --   --   --   --  15.5  --   HCT 46.0  --   --   --   --  45.4  --   PLT 172  --   --   --   --  166  --   LABPROT  --   --  14.0  --   --  14.2  --   INR  --   --  1.07  --   --  1.09  --   HEPARINUNFRC  --   --   --   --  0.21*  --  0.47  CREATININE 1.13  --   --   --   --  1.06  --   TROPONINI 0.19* 1.92*  --  2.35*  --   --   --      Assessment/Plan:  54yo male therapeutic on heparin after rate change. Will continue gtt at current rate and confirm stable with additional level.   Vernard Gambles, PharmD, BCPS  11/14/2014,7:21 AM

## 2014-11-14 NOTE — Interval H&P Note (Signed)
Cath Lab Visit (complete for each Cath Lab visit)  Clinical Evaluation Leading to the Procedure:   ACS: Yes.    Non-ACS:    Anginal Classification: CCS IV  Anti-ischemic medical therapy: Maximal Therapy (2 or more classes of medications)  Non-Invasive Test Results: No non-invasive testing performed  Prior CABG: No previous CABG      History and Physical Interval Note:  11/14/2014 4:07 PM  Seth Riddle  has presented today for surgery, with the diagnosis of unstable angina  The various methods of treatment have been discussed with the patient and family. After consideration of risks, benefits and other options for treatment, the patient has consented to  Procedure(s): LEFT HEART CATHETERIZATION WITH CORONARY ANGIOGRAM (N/A) as a surgical intervention .  The patient's history has been reviewed, patient examined, no change in status, stable for surgery.  I have reviewed the patient's chart and labs.  Questions were answered to the patient's satisfaction.     Marceil Welp A

## 2014-11-14 NOTE — Progress Notes (Signed)
ANTICOAGULATION CONSULT NOTE - Follow Up Consult  Pharmacy Consult for heparin Indication: chest pain/ACS   Labs:  Recent Labs  11/13/14 1045 11/13/14 1500 11/13/14 1718 11/13/14 2028 11/13/14 2256 11/14/14 0350 11/14/14 0638  HGB 16.0  --   --   --   --  15.5  --   HCT 46.0  --   --   --   --  45.4  --   PLT 172  --   --   --   --  166  --   LABPROT  --   --  14.0  --   --  14.2  --   INR  --   --  1.07  --   --  1.09  --   HEPARINUNFRC  --   --   --   --  0.21*  --  0.47  CREATININE 1.13  --   --   --   --  1.06  --   TROPONINI 0.19* 1.92*  --  2.35*  --   --   --      Assessment/Plan:  54yo male to resume heparin 8 hours after sheath removal.  Will resume at previous rate 1600 units/hr and check HL in 8 hours  Thanks for allowing pharmacy to be a part of this patient's care.  Talbert Cage, PharmD Clinical Pharmacist, [phone] 11/14/2014,5:47 PM

## 2014-11-14 NOTE — H&P (View-Only) (Signed)
   Patient Name: Seth Riddle Date of Encounter: 11/14/2014  Principal Problem:   NSTEMI (non-ST elevated myocardial infarction) Active Problems:   Dyslipidemia (high LDL; low HDL)   Family history of premature CAD   Primary Cardiologist: New  Patient Profile: 54 yo male w/ no hx CAD, hx HL, FH CAD, admitted 03/27 w/ chest pain, NSTEMI. Cath planned.  SUBJECTIVE: No chest pain overnight, no shortness of breath.  OBJECTIVE Filed Vitals:   11/13/14 2119 11/13/14 2121 11/14/14 0005 11/14/14 0400  BP: 137/94  117/78 114/84  Pulse:  89  73  Temp:  98.2 F (36.8 C) 98 F (36.7 C) 98.1 F (36.7 C)  TempSrc:  Oral Oral Oral  Resp: 20  18 20  Height:      Weight:    274 lb 11.2 oz (124.603 kg)  SpO2:   93% 94%    Intake/Output Summary (Last 24 hours) at 11/14/14 0731 Last data filed at 11/14/14 0600  Gross per 24 hour  Intake 853.98 ml  Output      0 ml  Net 853.98 ml   Filed Weights   11/13/14 1010 11/13/14 1700 11/14/14 0400  Weight: 260 lb (117.935 kg) 274 lb 8 oz (124.512 kg) 274 lb 11.2 oz (124.603 kg)    PHYSICAL EXAM General: Well developed, well nourished, male in no acute distress. Head: Normocephalic, atraumatic.  Neck: Supple without bruits, JVD not elevated. Lungs:  Resp regular and unlabored, CTA. Heart: RRR, S1, S2, no S3, S4, or murmur; no rub. Abdomen: Soft, non-tender, non-distended, BS + x 4.  Extremities: No clubbing, cyanosis, no edema.  Neuro: Alert and oriented X 3. Moves all extremities spontaneously. Psych: Normal affect.  LABS: CBC:  Recent Labs  11/13/14 1045 11/14/14 0350  WBC 7.2 7.1  HGB 16.0 15.5  HCT 46.0 45.4  MCV 89.0 88.5  PLT 172 166   INR:  Recent Labs  11/14/14 0350  INR 1.09   Basic Metabolic Panel:  Recent Labs  11/13/14 1045 11/14/14 0350  NA 138 138  K 4.1 3.8  CL 106 103  CO2 23 26  GLUCOSE 147* 138*  BUN 12 13  CREATININE 1.13 1.06  CALCIUM 9.0 9.3   Liver Function Tests:  Recent  Labs  11/14/14 0350  AST 55*  ALT 74*  ALKPHOS 78  BILITOT 1.0  PROT 6.7  ALBUMIN 3.4*   Cardiac Enzymes:  Recent Labs  11/13/14 1045 11/13/14 1500 11/13/14 2028  TROPONINI 0.19* 1.92* 2.35*    Recent Labs  11/13/14 1051  TROPIPOC 0.16*   D-dimer:  Recent Labs  11/13/14 1045  DDIMER 0.27   Hemoglobin A1C:No results for input(s): HGBA1C in the last 72 hours. Fasting Lipid Panel:  Recent Labs  11/14/14 0350  CHOL 174  HDL 24*  LDLCALC 116*  TRIG 168*  CHOLHDL 7.3   TELE:  Sinus rhythm, no significant ectopy      Radiology/Studies: Dg Chest 2 View 11/13/2014   CLINICAL DATA:  Midsternal chest pain radiating to the left arm and jaw.  EXAM: CHEST  2 VIEW  COMPARISON:  None.  FINDINGS: Normal cardiac and mediastinal contours. No consolidative pulmonary opacities. No pleural effusion or pneumothorax. Mid thoracic spine degenerative changes.  IMPRESSION: No acute cardiopulmonary process.   Electronically Signed   By: Drew  Davis M.D.   On: 11/13/2014 11:26     Current Medications:  . aspirin  324 mg Oral NOW   Or  . aspirin  300   mg Rectal NOW  . aspirin EC  81 mg Oral Daily  . atorvastatin  80 mg Oral q1800  . metoprolol tartrate  12.5 mg Oral BID  . sodium chloride  3 mL Intravenous Q12H  . sodium chloride  3 mL Intravenous Q12H   . sodium chloride 1 mL/kg/hr (11/14/14 0405)  . heparin 1,600 Units/hr (11/14/14 0406)    ASSESSMENT AND PLAN: Principal Problem:   NSTEMI (non-ST elevated myocardial infarction) - Pain-free on aspirin, statin, beta blocker and heparin - Cardiac catheterization scheduled for this p.m., clear liquids this a.m. - The risks and benefits of a cardiac catheterization including, but not limited to, death, stroke, MI, kidney damage and bleeding were discussed with the patient who indicates understanding and agrees to proceed.   Active Problems:   Dyslipidemia (high LDL; low HDL) - Statin is new    Family history of premature  CAD  Signed, Rhonda Barrett , PA-C 7:31 AM 11/14/2014 Patient seen and examined and history reviewed. Agree with above findings and plan. Patient reports he is pain free. Exam is benign. Ecg is normal but troponin elevation is significant to 2.35. Risk factors include dyslipidemia, obesity, and family history. On high dose statin, ASA, and heparin. Will proceed with cardiac cath today with possible PCI. The procedure and risks were reviewed including but not limited to death, myocardial infarction, stroke, arrythmias, bleeding, transfusion, emergency surgery, dye allergy, or renal dysfunction. The patient voices understanding and is agreeable to proceed.    Peter Jordan, MDFACC 11/14/2014 10:16 AM    

## 2014-11-14 NOTE — Progress Notes (Signed)
Patient Name: Seth Riddle Date of Encounter: 11/14/2014  Principal Problem:   NSTEMI (non-ST elevated myocardial infarction) Active Problems:   Dyslipidemia (high LDL; low HDL)   Family history of premature CAD   Primary Cardiologist: New  Patient Profile: 54 yo male w/ no hx CAD, hx HL, FH CAD, admitted 03/27 w/ chest pain, NSTEMI. Cath planned.  SUBJECTIVE: No chest pain overnight, no shortness of breath.  OBJECTIVE Filed Vitals:   11/13/14 2119 11/13/14 2121 11/14/14 0005 11/14/14 0400  BP: 137/94  117/78 114/84  Pulse:  89  73  Temp:  98.2 F (36.8 C) 98 F (36.7 C) 98.1 F (36.7 C)  TempSrc:  Oral Oral Oral  Resp: 20  18 20   Height:      Weight:    274 lb 11.2 oz (124.603 kg)  SpO2:   93% 94%    Intake/Output Summary (Last 24 hours) at 11/14/14 0731 Last data filed at 11/14/14 0600  Gross per 24 hour  Intake 853.98 ml  Output      0 ml  Net 853.98 ml   Filed Weights   11/13/14 1010 11/13/14 1700 11/14/14 0400  Weight: 260 lb (117.935 kg) 274 lb 8 oz (124.512 kg) 274 lb 11.2 oz (124.603 kg)    PHYSICAL EXAM General: Well developed, well nourished, male in no acute distress. Head: Normocephalic, atraumatic.  Neck: Supple without bruits, JVD not elevated. Lungs:  Resp regular and unlabored, CTA. Heart: RRR, S1, S2, no S3, S4, or murmur; no rub. Abdomen: Soft, non-tender, non-distended, BS + x 4.  Extremities: No clubbing, cyanosis, no edema.  Neuro: Alert and oriented X 3. Moves all extremities spontaneously. Psych: Normal affect.  LABS: CBC:  Recent Labs  11/13/14 1045 11/14/14 0350  WBC 7.2 7.1  HGB 16.0 15.5  HCT 46.0 45.4  MCV 89.0 88.5  PLT 172 166   INR:  Recent Labs  11/14/14 0350  INR 1.09   Basic Metabolic Panel:  Recent Labs  22/97/98 1045 11/14/14 0350  NA 138 138  K 4.1 3.8  CL 106 103  CO2 23 26  GLUCOSE 147* 138*  BUN 12 13  CREATININE 1.13 1.06  CALCIUM 9.0 9.3   Liver Function Tests:  Recent  Labs  11/14/14 0350  AST 55*  ALT 74*  ALKPHOS 78  BILITOT 1.0  PROT 6.7  ALBUMIN 3.4*   Cardiac Enzymes:  Recent Labs  11/13/14 1045 11/13/14 1500 11/13/14 2028  TROPONINI 0.19* 1.92* 2.35*    Recent Labs  11/13/14 1051  TROPIPOC 0.16*   D-dimer:  Recent Labs  11/13/14 1045  DDIMER 0.27   Hemoglobin A1C:No results for input(s): HGBA1C in the last 72 hours. Fasting Lipid Panel:  Recent Labs  11/14/14 0350  CHOL 174  HDL 24*  LDLCALC 116*  TRIG 168*  CHOLHDL 7.3   TELE:  Sinus rhythm, no significant ectopy      Radiology/Studies: Dg Chest 2 View 11/13/2014   CLINICAL DATA:  Midsternal chest pain radiating to the left arm and jaw.  EXAM: CHEST  2 VIEW  COMPARISON:  None.  FINDINGS: Normal cardiac and mediastinal contours. No consolidative pulmonary opacities. No pleural effusion or pneumothorax. Mid thoracic spine degenerative changes.  IMPRESSION: No acute cardiopulmonary process.   Electronically Signed   By: Annia Belt M.D.   On: 11/13/2014 11:26     Current Medications:  . aspirin  324 mg Oral NOW   Or  . aspirin  300  mg Rectal NOW  . aspirin EC  81 mg Oral Daily  . atorvastatin  80 mg Oral q1800  . metoprolol tartrate  12.5 mg Oral BID  . sodium chloride  3 mL Intravenous Q12H  . sodium chloride  3 mL Intravenous Q12H   . sodium chloride 1 mL/kg/hr (11/14/14 0405)  . heparin 1,600 Units/hr (11/14/14 0406)    ASSESSMENT AND PLAN: Principal Problem:   NSTEMI (non-ST elevated myocardial infarction) - Pain-free on aspirin, statin, beta blocker and heparin - Cardiac catheterization scheduled for this p.m., clear liquids this a.m. - The risks and benefits of a cardiac catheterization including, but not limited to, death, stroke, MI, kidney damage and bleeding were discussed with the patient who indicates understanding and agrees to proceed.   Active Problems:   Dyslipidemia (high LDL; low HDL) - Statin is new    Family history of premature  CAD  Signed, Theodore Demark , PA-C 7:31 AM 11/14/2014 Patient seen and examined and history reviewed. Agree with above findings and plan. Patient reports he is pain free. Exam is benign. Ecg is normal but troponin elevation is significant to 2.35. Risk factors include dyslipidemia, obesity, and family history. On high dose statin, ASA, and heparin. Will proceed with cardiac cath today with possible PCI. The procedure and risks were reviewed including but not limited to death, myocardial infarction, stroke, arrythmias, bleeding, transfusion, emergency surgery, dye allergy, or renal dysfunction. The patient voices understanding and is agreeable to proceed.    Lataya Varnell [place], MDFACC 11/14/2014 10:16 AM

## 2014-11-14 NOTE — CV Procedure (Signed)
Seth Riddle is a 54 y.o. male   [id number]  [id number] LOCATION:  FACILITY: [facility]  PHYSICIAN: Lennette Bihari, MD, [facility] [birth year]   DATE OF PROCEDURE:  11/14/2014     CARDIAC CATHETERIZATION    HISTORY:   Mr. Deren is a 54 year old gentleman who has a history of hyperlipidemia, family history for premature coronary artery disease, who has experienced several episodes of chest discomfort in which he was awakened from sleep.  His most recent episode was early Sunday morning and presented to the hospital yesterday.  Cardiac enzymes were mildly positive consistent with a non-ST segment elevation MI.  He is referred for definitive diagnostic cardiac catheterization.     PROCEDURE:  Left heart catheterization via the radial approach: Coronary angiography, left ventriculography.  The patient was brought to the second floor [facility] [facility] cath lab in the postabsorptive state. The patient was premedicated with Versed 2 mg and fentanyl 50 mcg. A right radial approach was utilized after an Allen's test verified adequate circulation. The right radial artery was punctured via the Seldinger technique, and a 6 [place] Glidesheath Slender was inserted without difficulty.  A radial cocktail consisting of Verapamil, IV nitroglycerin, and lidocaine was administered. Weight adjusted heparin was administered. A safety J wire was advanced into the ascending aorta. Diagnostic catheterization was done with a 5 [place] TIG 4.0 catheter. A 5 French pigtail catheter was used for left ventriculography. A TR radial band was applied for hemostasis. The patient left the catheterization laboratory in stable condition.   HEMODYNAMICS:   Central Aorta: 122/83  Left Ventricle: 122/15/28  ANGIOGRAPHY:   The left main coronary artery was angiographically normal and bifurcated into the LAD and left circumflex coronary artery.   The LAD  was a large caliber vessel and immmediately gave rise to a very high  diagonal (ramus intermediate like vessel).  There was diffuse 95% stenosis in the LAD in the region of this diagonal takeoff with 90-95% stenosis diffusely just beyond the origin of this ramus like vessel and 90% diffuse stenosis at the origin of the ramus like high diagonal vessel almost immediately after the left main.  The left circumflex coronary artery was a moderate size vessel that gave rise to one major marginal branch.  There was 95%  focal stenosis just beyond an ectatic segment in the proximal obtuse marginal branch and there was diffuse distal 90% marginal stenosis.  The RCA was angiographically normal , dominant vessel that gave rise to a large PDA and PLA vessel.   Left ventriculography revealed normal global LV contractility without focal segmental wall motion abnormalities. There was no evidence for mitral regurgitation.  Ejection fraction is 55-60%.    IMPRESSION:  Severe two-vessel coronary artery disease with 95% very proximal (near ostial) diffuse LAD stenosis and 90% diffuse high diagonal (ramus like vessel) with 30% mid LAD stenoses; and 95% obtuse marginal proximal stenosis with diffuse 90% distal marginal stenosis.  Preserved LV function with an ejection fraction of 55-60%.   RECOMMENDATION:  Angiography will be reviewed with colleagues.  Due to the high grade bifurcation near ostial stenoses involving a large LAD system and large high diagonal (ramus  intermediate like vessel) in addition to the high-grade circumflex marginal stenoses CABG surgery may provide best long-term treatment.  Surgical consultation will be obtained.   Lennette Bihari, MD, [facility] 11/14/2014 5:07 PM

## 2014-11-14 NOTE — Progress Notes (Signed)
Patient ID: Seth Riddle, male   DOB: 1961/06/06, 54 y.o.   MRN: [id number]      [address]       [place] [address]             [phone]        Seth Riddle [facility] Medical Record #[id number] Date of Birth: May 25, 1961  Referring: No ref. provider found Primary Care: Seth Riddle, Seth Mesi, MD  Chief Complaint:    Chief Complaint  Patient presents with  . Chest Pain    History of Present Illness:     The patient is a 54 yr old male with a past medical history of dyslipidemia and a family history of premature coronary artery disease.  He went to a movie with his wife Saturday night 3/26  and had popcorn.He was then awoken by chest pain which was retrosternal and radiated into his jaw and left arm. He took 3 baby ASA and 3 antacid tablets which did not provide much relief. His wife says "He looked bad, looked like he was short of breath, and was bent over". He denies associated nausea, palpitations, and lightheadedness. Afterwards, he took a suitcase out to the car with continued symptoms, as he and his wife were planning on going to [facility]) to take her mother to a doctor's appointment. He then drove to [facility] hospital emergency room.   He was in [facility] two weeks ago and had similar symptoms. He thought it was due to sleep deprivation and took a shower, felt better, and then walked 4-5 miles from his hotel to the convention center without difficulty.    ECG demonstrates normal sinus rhythm and was without ischemic ST-T abnormalities.  I-stat troponin was 0.16 and initial serum troponin was 0.19. Chest xray, CBC, and d-dimer were normal.   Parents both had MI's, mother had CABG at 63.He was told his LDL is mildly elevated and his HDL is low. His mother had an MI and CABG at age 59 and his father had an MI as well. His father also has atrial fibrillation and had an ablation, and also had low HDL. His brother has atrial fibrillation requiring  cardioversion.   Soc: 3 children. Nonsmoker. Married. Works in Occupational hygienist. Grew up with Seth Riddle.  Zubrod Score: At the time of surgery this patient's most appropriate activity status/level should be described as: [x]     0    Normal activity, no symptoms []     1    Restricted in physical strenuous activity but ambulatory, able to do out light work []     2    Ambulatory and capable of self care, unable to do work activities, up and about                 more than 50%  Of the time                            []     3    Only limited self care, in bed greater than 50% of waking hours []     4    Completely disabled, no self care, confined to bed or chair []     5    Moribund  Past Medical History  Diagnosis Date  . Dyslipidemia (high LDL; low HDL)   . Family history of premature CAD    Previous surgical history: lumbar back surgery  History reviewed. No pertinent  past surgical history.  History  Smoking status  . Never Smoker   Smokeless tobacco  . Never Used    History  Alcohol Use  . Yes    History   Social History  . Marital Status: Married    Spouse Name: N/A  . Number of Children: N/A  . Years of Education: N/A   Occupational History  .  works in Animator business , does involve traveling    Social History Main Topics  . Smoking status: Never Smoker   . Smokeless tobacco: Never Used  . Alcohol Use: Yes  . Drug Use: No  . Sexual Activity: Not on file     Allergies  Allergen Reactions  . Coconut Oil Hives    Current Facility-Administered Medications  Medication Dose Route Frequency Provider Last Rate Last Dose  . 0.9 %  sodium chloride infusion  250 mL Intravenous PRN Seth Hora, PA-C      . acetaminophen (TYLENOL) tablet 650 mg  650 mg Oral Q4H PRN Seth Hora, PA-C   650 mg at 11/14/14 0405  . [START ON 11/15/2014] aspirin EC tablet 81 mg  81 mg Oral Daily Seth Bihari, MD      . atorvastatin (LIPITOR) tablet 80 mg  80 mg Oral q1800  Seth Bihari, MD      . Melene Muller ON 11/15/2014] heparin ADULT infusion 100 units/mL (25000 units/250 mL)  1,600 Units/hr Intravenous Continuous Seth Linden, MD      . metoprolol tartrate (LOPRESSOR) tablet 12.5 mg  12.5 mg Oral BID Seth Hora, PA-C   12.5 mg at 11/14/14 5364  . nitroGLYCERIN (NITROSTAT) SL tablet 0.4 mg  0.4 mg Sublingual Q5 Min x 3 PRN Seth Hora, PA-C      . ondansetron [facility]) injection 4 mg  4 mg Intravenous Q6H PRN Seth Bihari, MD      . sodium chloride 0.9 % injection 3 mL  3 mL Intravenous Q12H Seth Hora, PA-C   3 mL at 11/14/14 1000  . sodium chloride 0.9 % injection 3 mL  3 mL Intravenous PRN Seth Hora, PA-C      . temazepam (RESTORIL) capsule 15 mg  15 mg Oral QHS PRN Seth Done, MD   15 mg at 11/13/14 2316    Prescriptions prior to admission  Medication Sig Dispense Refill Last Dose  . aspirin 325 MG tablet Take 325 mg by mouth daily as needed for mild pain or moderate pain.   11/13/2014 at 800  . aspirin 81 MG tablet Take 81 mg by mouth daily as needed for pain.   11/13/2014 at Unknown time  . Aspirin-Salicylamide-Caffeine (BC HEADACHE POWDER PO) Take 1 Package by mouth daily as needed (headache).   Past Week at Unknown time  . MELATONIN PO Take 1 tablet by mouth daily as needed (sleep).   Past Week at Unknown time    Family history: Patient's mother had bypass surgery at age 11 for left main disease, 1 brother has A. Fib, his father had myocardial infarction treated with stent.   Review of Systems:     Cardiac Review of Systems: Y or N  Chest Pain [ y ]  Resting SOB [ n  ] Exertional SOB  Seth Riddle  ]  Orthopnea Seth Riddle  ]   Pedal Edemay [ n  ]    Palpitations [ n ] Syncope  Seth.Riddle  ]   Presyncope [ n  ]  General Review of Systems: [Y] = yes [  ]=no Constitional: recent weight change [  n]; anorexia [n  ]; fatigue [ y ]; nausea [n  ]; night sweats [n  ]; fever [ n ]; or chills [n  ]                                                                Dental: poor dentition[  ]; Last Dentist visit:   Eye : blurred vision [  ]; diplopia [   ]; vision changes [  ];  Amaurosis fugax[  ]; Resp: cough [  ];  wheezing[n  ];  hemoptysis[n ]; shortness of breath[ n ]; paroxysmal nocturnal dyspnea[ n ]; dyspnea on exertion[ y ]; or orthopnea[  ];  GI:  gallstones[  ], vomiting[  ];  dysphagia[  ]; melena[  ];  hematochezia [  ]; heartburn[  ];   Hx of  Colonoscopy[  ]; GU: kidney stones [  ]; hematuria[  ];   dysuria [  ];  nocturia[  ];  history of     obstruction [  ]; urinary frequency [  ]             Skin: rash, swelling[  ];, hair loss[  ];  peripheral edema[  ];  or itching[  ]; Musculosketetal: myalgias[  ];  joint swelling[  ];  joint erythema[  ];  joint pain[  ];  back pain[  ];  Heme/Lymph: bruising[  ];  bleeding[  ];  anemia[  ];  Neuro: TIA[  ];  headaches[  ];  stroke[  ];  vertigo[  ];  seizures[  ];   paresthesias[  ];  difficulty walking[ n ]; back pain with chronic paresthesias  anterior thigh bilaterial  Psych:depression[  ]; anxiety[  ];  Endocrine: diabetes[ n ];  thyroid dysfunction[n  ];  Immunizations: Flu [ n ]; Pneumococcal[n  ];  Other:  Physical Exam: BP 116/72 mmHg  Pulse 68  Temp(Src) 98 F (36.7 C) (Oral)  Resp 18  Ht  (1.803 m)  Wt 274 lb 11.2 oz (124.603 kg)  BMI 38.33 kg/m2  SpO2 95%   General appearance: alert, cooperative, appears older than stated age and no distress Head: Normocephalic, without obvious abnormality, atraumatic Neck: no adenopathy, no carotid bruit, no JVD, supple, symmetrical, trachea midline and thyroid not enlarged, symmetric, no tenderness/mass/nodules Lymph nodes: Cervical, supraclavicular, and axillary nodes normal. Resp: clear to auscultation bilaterally Back: symmetric, no curvature. ROM normal. No CVA tenderness. Cardio: regular rate and rhythm, S1, S2 normal, no murmur, click, rub or gallop GI: soft, non-tender; bowel sounds normal; no masses,  no  organomegaly Extremities: extremities normal, atraumatic, no cyanosis or edema and Homans sign is negative, no sign of DVT Neurologic: Grossly normal No carotid bruits, palpable dt and pt pulses  Diagnostic Studies & Laboratory data:     Recent Radiology Findings:   Dg Chest 2 View  11/13/2014   CLINICAL DATA:  Midsternal chest pain radiating to the left arm and jaw.  EXAM: CHEST  2 VIEW  COMPARISON:  None.  FINDINGS: Normal cardiac and mediastinal contours. No consolidative pulmonary opacities. No pleural effusion or pneumothorax. Mid thoracic spine degenerative changes.  IMPRESSION: No acute cardiopulmonary process.  Electronically Signed   By: Seth Riddle M.D.   On: 11/13/2014 11:26     I have independently reviewed the above radiologic studies.  Recent Lab Findings: Lab Results  Component Value Date   WBC 7.1 11/14/2014   HGB 15.5 11/14/2014   HCT 45.4 11/14/2014   PLT 166 11/14/2014   GLUCOSE 138* 11/14/2014   CHOL 174 11/14/2014   TRIG 168* 11/14/2014   HDL 24* 11/14/2014   LDLCALC 116* 11/14/2014   ALT 74* 11/14/2014   AST 55* 11/14/2014   NA 138 11/14/2014   K 3.8 11/14/2014   CL 103 11/14/2014   CREATININE 1.06 11/14/2014   BUN 13 11/14/2014   CO2 26 11/14/2014   INR 1.09 11/14/2014   Lab Results  Component Value Date   TROPONINI 2.35* 11/13/2014   CATH: ANGIOGRAPHY:   The left main coronary artery was angiographically normal and bifurcated into the LAD and left circumflex coronary artery.   The LAD was a large caliber vessel and immmediately gave rise to a very high diagonal (ramus intermediate like vessel). There was diffuse 95% stenosis in the LAD in the region of this diagonal takeoff with 90-95% stenosis diffusely just beyond the origin of this ramus like vessel and 90% diffuse stenosis at the origin of the ramus like high diagonal vessel almost immediately after the left main.  The left circumflex coronary artery was a moderate size vessel that gave  rise to one major marginal branch. There was 95% focal stenosis just beyond an ectatic segment in the proximal obtuse marginal branch and there was diffuse distal 90% marginal stenosis.  The RCA was angiographically normal , dominant vessel that gave rise to a large PDA and PLA vessel.   Left ventriculography revealed normal global LV contractility without focal segmental wall motion abnormalities. There was no evidence for mitral regurgitation. Ejection fraction is 55-60%.    IMPRESSION:  Severe two-vessel coronary artery disease with 95% very proximal (near ostial) diffuse LAD stenosis and 90% diffuse high diagonal (ramus like vessel) with 30% mid LAD stenoses; and 95% obtuse marginal proximal stenosis with diffuse 90% distal marginal stenosis.  Preserved LV function with an ejection fraction of 55-60%.  I have independently reviewed the above  cath films and reviewed the findings with the  patient .  ECHO: Study Conclusions  - Left ventricle: The cavity size was normal. Wall thickness was increased in a pattern of mild LVH. Systolic function was mildly reduced. The estimated ejection fraction was in the range of 45% to 50%. Diffuse hypokinesis. Doppler parameters are consistent with abnormal left ventricular relaxation (grade 1 diastolic dysfunction). - Right ventricle: The cavity size was mildly dilated. - Right atrium: The atrium was mildly dilated.  Impressions:  - Mild global reduction in LV function (EF 50); grade 1 diastolic dysfunction; mild RAE/RVE.  RECOMMENDATION:  Angiography will be reviewed with colleagues. Due to the high grade bifurcation near ostial stenoses involving a large LAD system and large high diagonal (ramus intermediate like vessel) in addition to the high-grade circumflex marginal stenoses CABG surgery may provide best long-term treatment. Surgical consultation will be obtained.  Assessment / Plan:    1/ Non-STEMI myocardial  infarction, with severe two-vessel coronary artery disease including high-grade proximal LAD disease and diffuse obtuse marginal stenosis- anatomically not suitable for angioplasty preserved LV function  2/dyslipidemia 3/strong family history for coronary occlusive disease and atrial fibrillation  With the patient's high-grade very proximal LAD lesion and circumflex disease but not suitable for  angioplasty I agree with Dr. Michel Riddle recommendation to proceed with coronary artery bypass grafting. Risks and options have been discussed in detail with the patient his wife mother and father. The goals risks and alternatives of the planned surgical procedure CABG  have been discussed with the patient in detail. The risks of the procedure including death, infection, stroke, myocardial infarction, bleeding, blood transfusion have all been discussed specifically.  I have quoted Seth Riddle a 3 % of perioperative mortality and a complication rate as high as 25%. The patient's questions have been answered.Seth Riddle is willing  to proceed with the planned procedure.   We'll plan to add the patient on to the OR schedule for tomorrow.  I  spent 40 minutes counseling the patient face to face and 50% or more the  time was spent in counseling and coordination of care. The total time spent in the appointment was 60 minutes.    Seth Ovens MD      [address] [place] [place] [address] Office [phone]   Beeper [phone]  11/14/2014 6:23 PM

## 2014-11-14 NOTE — Progress Notes (Signed)
Pt continues to complain of mild chest pressure and severe HA with IV NTG.  BP 107/68 HR 78 O2 sat 99 on 4L/[place]. MD on call paged to inform. Will continue to monitor. Dierdre Highman, RN

## 2014-11-14 NOTE — Progress Notes (Signed)
Utilization review completed. Meredyth Hornung, RN, BSN. 

## 2014-11-14 NOTE — Progress Notes (Signed)
2020 Pt complained of chest "pressure" and jaw pain.  BP 152/107 HR 88. O2 applied at 4L/[place] and 1 SL NTG given.   2025 BP 142/95 Still complains of chest pressure and jaw pain-pt very anxious and crying out for wife. 2nd SL NTG given--EKG obtained and MD paged. MD orders for NTG gtt and IV ativan.  Pt states pressure is better but still present, wife and father at bedside. 2040 IV NTG started at 10 mcg/min BP 133/86 HR 71, and 1mg  IV ativan given per orders. 2048 BP 117/86 HR 72 pt states chest pressure and jaw pain better and less anxious. Will continue to monitor and titrate NTG as needed. Dierdre Highman, RN

## 2014-11-15 ENCOUNTER — Inpatient Hospital Stay (HOSPITAL_COMMUNITY): Payer: BC Managed Care – PPO

## 2014-11-15 ENCOUNTER — Encounter (HOSPITAL_COMMUNITY): Payer: Self-pay | Admitting: Certified Registered Nurse Anesthetist

## 2014-11-15 ENCOUNTER — Inpatient Hospital Stay (HOSPITAL_COMMUNITY): Payer: BC Managed Care – PPO | Admitting: Certified Registered Nurse Anesthetist

## 2014-11-15 ENCOUNTER — Other Ambulatory Visit: Payer: Self-pay

## 2014-11-15 ENCOUNTER — Encounter (HOSPITAL_COMMUNITY)
Admission: EM | Disposition: A | Discharge status: Home / Self Care | Payer: BC Managed Care – PPO | Source: Home / Self Care | Attending: Thoracic Surgery (Cardiothoracic Vascular Surgery)

## 2014-11-15 DIAGNOSIS — I2102 ST elevation (STEMI) myocardial infarction involving left anterior descending coronary artery: Secondary | ICD-10-CM

## 2014-11-15 DIAGNOSIS — Z951 Presence of aortocoronary bypass graft: Secondary | ICD-10-CM

## 2014-11-15 DIAGNOSIS — I214 Non-ST elevation (NSTEMI) myocardial infarction: Secondary | ICD-10-CM | POA: Insufficient documentation

## 2014-11-15 HISTORY — PX: INTRA-AORTIC BALLOON PUMP INSERTION: SHX5475

## 2014-11-15 HISTORY — PX: PERCUTANEOUS CORONARY STENT INTERVENTION (PCI-S): SHX5485

## 2014-11-15 HISTORY — PX: CORONARY ARTERY BYPASS GRAFT: SHX141

## 2014-11-15 HISTORY — PX: TEE WITHOUT CARDIOVERSION: SHX5443

## 2014-11-15 LAB — POCT I-STAT 3, ART BLOOD GAS (G3+)
ACID-BASE DEFICIT: 5 mmol/L — AB (ref 0.0–2.0)
ACID-BASE DEFICIT: 4 mmol/L — AB (ref 0.0–2.0)
Acid-base deficit: 3 mmol/L — ABNORMAL HIGH (ref 0.0–2.0)
Acid-base deficit: 6 mmol/L — ABNORMAL HIGH (ref 0.0–2.0)
BICARBONATE: 20.1 meq/L (ref 20.0–24.0)
Bicarbonate: 23.8 mEq/L (ref 20.0–24.0)
Bicarbonate: 22.8 mEq/L (ref 20.0–24.0)
Bicarbonate: 21.4 mEq/L (ref 20.0–24.0)
O2 SAT: 100 %
O2 SAT: 96 %
O2 Saturation: 92 %
O2 Saturation: 83 %
PCO2 ART: 52.4 mmHg — AB (ref 35.0–45.0)
PCO2 ART: 41.7 mmHg (ref 35.0–45.0)
PH ART: 7.327 — AB (ref 7.350–7.450)
PO2 ART: 73 mmHg — AB (ref 80.0–100.0)
PO2 ART: 96 mmHg (ref 80.0–100.0)
Patient temperature: 38.6
TCO2: 25 mmol/L (ref 0–100)
TCO2: 24 mmol/L (ref 0–100)
TCO2: 21 mmol/L (ref 0–100)
TCO2: 23 mmol/L (ref 0–100)
pCO2 arterial: 47.3 mmHg — ABNORMAL HIGH (ref 35.0–45.0)
pCO2 arterial: 41.8 mmHg (ref 35.0–45.0)
pH, Arterial: 7.31 — ABNORMAL LOW (ref 7.350–7.450)
pH, Arterial: 7.246 — ABNORMAL LOW (ref 7.350–7.450)
pH, Arterial: 7.298 — ABNORMAL LOW (ref 7.350–7.450)
pO2, Arterial: 243 mmHg — ABNORMAL HIGH (ref 80.0–100.0)
pO2, Arterial: 58 mmHg — ABNORMAL LOW (ref 80.0–100.0)

## 2014-11-15 LAB — POCT I-STAT, CHEM 8
BUN: 13 mg/dL (ref 6–23)
BUN: 11 mg/dL (ref 6–23)
BUN: 12 mg/dL (ref 6–23)
BUN: 12 mg/dL (ref 6–23)
BUN: 8 mg/dL (ref 6–23)
BUN: 14 mg/dL (ref 6–23)
CALCIUM ION: 1.23 mmol/L (ref 1.12–1.23)
CHLORIDE: 102 mmol/L (ref 96–112)
CHLORIDE: 102 mmol/L (ref 96–112)
CREATININE: 1 mg/dL (ref 0.50–1.35)
CREATININE: 0.6 mg/dL (ref 0.50–1.35)
CREATININE: 1.2 mg/dL (ref 0.50–1.35)
Calcium, Ion: 1.23 mmol/L (ref 1.12–1.23)
Calcium, Ion: 1.07 mmol/L — ABNORMAL LOW (ref 1.12–1.23)
Calcium, Ion: 1.23 mmol/L (ref 1.12–1.23)
Calcium, Ion: 1.11 mmol/L — ABNORMAL LOW (ref 1.12–1.23)
Calcium, Ion: 1.08 mmol/L — ABNORMAL LOW (ref 1.12–1.23)
Chloride: 98 mmol/L (ref 96–112)
Chloride: 101 mmol/L (ref 96–112)
Chloride: 98 mmol/L (ref 96–112)
Chloride: 105 mmol/L (ref 96–112)
Creatinine, Ser: 0.9 mg/dL (ref 0.50–1.35)
Creatinine, Ser: 0.9 mg/dL (ref 0.50–1.35)
Creatinine, Ser: 0.9 mg/dL (ref 0.50–1.35)
GLUCOSE: 144 mg/dL — AB (ref 70–99)
GLUCOSE: 133 mg/dL — AB (ref 70–99)
GLUCOSE: 145 mg/dL — AB (ref 70–99)
GLUCOSE: 189 mg/dL — AB (ref 70–99)
Glucose, Bld: 119 mg/dL — ABNORMAL HIGH (ref 70–99)
Glucose, Bld: 139 mg/dL — ABNORMAL HIGH (ref 70–99)
HCT: 41 % (ref 39.0–52.0)
HCT: 39 % (ref 39.0–52.0)
HCT: 34 % — ABNORMAL LOW (ref 39.0–52.0)
HCT: 26 % — ABNORMAL LOW (ref 39.0–52.0)
HEMATOCRIT: 33 % — AB (ref 39.0–52.0)
HEMATOCRIT: 39 % (ref 39.0–52.0)
HEMOGLOBIN: 13.3 g/dL (ref 13.0–17.0)
Hemoglobin: 13.9 g/dL (ref 13.0–17.0)
Hemoglobin: 11.2 g/dL — ABNORMAL LOW (ref 13.0–17.0)
Hemoglobin: 11.6 g/dL — ABNORMAL LOW (ref 13.0–17.0)
Hemoglobin: 8.8 g/dL — ABNORMAL LOW (ref 13.0–17.0)
Hemoglobin: 13.3 g/dL (ref 13.0–17.0)
POTASSIUM: 3.9 mmol/L (ref 3.5–5.1)
POTASSIUM: 4.2 mmol/L (ref 3.5–5.1)
POTASSIUM: 4.2 mmol/L (ref 3.5–5.1)
Potassium: 4.3 mmol/L (ref 3.5–5.1)
Potassium: 4.7 mmol/L (ref 3.5–5.1)
Potassium: 4.4 mmol/L (ref 3.5–5.1)
SODIUM: 138 mmol/L (ref 135–145)
SODIUM: 135 mmol/L (ref 135–145)
SODIUM: 133 mmol/L — AB (ref 135–145)
SODIUM: 139 mmol/L (ref 135–145)
Sodium: 138 mmol/L (ref 135–145)
Sodium: 136 mmol/L (ref 135–145)
TCO2: 22 mmol/L (ref 0–100)
TCO2: 22 mmol/L (ref 0–100)
TCO2: 22 mmol/L (ref 0–100)
TCO2: 22 mmol/L (ref 0–100)
TCO2: 15 mmol/L (ref 0–100)
TCO2: 20 mmol/L (ref 0–100)

## 2014-11-15 LAB — CBC
HCT: 43.2 % (ref 39.0–52.0)
HEMATOCRIT: 42.1 % (ref 39.0–52.0)
HEMATOCRIT: 39.3 % (ref 39.0–52.0)
HEMOGLOBIN: 14.8 g/dL (ref 13.0–17.0)
Hemoglobin: 14.6 g/dL (ref 13.0–17.0)
Hemoglobin: 13.4 g/dL (ref 13.0–17.0)
MCH: 30 pg (ref 26.0–34.0)
MCH: 31.1 pg (ref 26.0–34.0)
MCH: 30.5 pg (ref 26.0–34.0)
MCHC: 33.8 g/dL (ref 30.0–36.0)
MCHC: 35.2 g/dL (ref 30.0–36.0)
MCHC: 34.1 g/dL (ref 30.0–36.0)
MCV: 88.7 fL (ref 78.0–100.0)
MCV: 88.4 fL (ref 78.0–100.0)
MCV: 89.5 fL (ref 78.0–100.0)
Platelets: 168 10*3/uL (ref 150–400)
Platelets: 143 10*3/uL — ABNORMAL LOW (ref 150–400)
Platelets: 123 10*3/uL — ABNORMAL LOW (ref 150–400)
RBC: 4.87 MIL/uL (ref 4.22–5.81)
RBC: 4.76 MIL/uL (ref 4.22–5.81)
RBC: 4.39 MIL/uL (ref 4.22–5.81)
RDW: 12.7 % (ref 11.5–15.5)
RDW: 12.7 % (ref 11.5–15.5)
RDW: 12.8 % (ref 11.5–15.5)
WBC: 8.4 10*3/uL (ref 4.0–10.5)
WBC: 17.5 10*3/uL — AB (ref 4.0–10.5)
WBC: 12.2 10*3/uL — ABNORMAL HIGH (ref 4.0–10.5)

## 2014-11-15 LAB — BASIC METABOLIC PANEL
Anion gap: 4 — ABNORMAL LOW (ref 5–15)
BUN: 11 mg/dL (ref 6–23)
CO2: 27 mmol/L (ref 19–32)
Calcium: 9 mg/dL (ref 8.4–10.5)
Chloride: 105 mmol/L (ref 96–112)
Creatinine, Ser: 1.04 mg/dL (ref 0.50–1.35)
GFR calc Af Amer: >90 mL/min (ref >90)
GFR calc non Af Amer: 80 mL/min — ABNORMAL LOW (ref >90)
Glucose, Bld: 148 mg/dL — ABNORMAL HIGH (ref 70–99)
Potassium: 3.9 mmol/L (ref 3.5–5.1)
Sodium: 136 mmol/L (ref 135–145)

## 2014-11-15 LAB — URINALYSIS, ROUTINE W REFLEX MICROSCOPIC
Bilirubin Urine: NEGATIVE
Glucose, UA: NEGATIVE mg/dL
Hgb urine dipstick: NEGATIVE
Ketones, ur: NEGATIVE mg/dL
Leukocytes, UA: NEGATIVE
Nitrite: NEGATIVE
Protein, ur: NEGATIVE mg/dL
Specific Gravity, Urine: 1.041 — ABNORMAL HIGH (ref 1.005–1.030)
Urobilinogen, UA: 1 mg/dL (ref 0.0–1.0)
pH: 5.5 (ref 5.0–8.0)

## 2014-11-15 LAB — APTT
APTT: 37 s (ref 24–37)
aPTT: 65 seconds — ABNORMAL HIGH (ref 24–37)

## 2014-11-15 LAB — PULMONARY FUNCTION TEST
FEF 25-75 Pre: 3 L/sec
FEF2575-%Pred-Pre: 89 %
FEV1-%Pred-Pre: 72 %
FEV1-Pre: 2.85 L
FEV1FVC-%Pred-Pre: 105 %
FEV6-%Pred-Pre: 68 %
FEV6-Pre: 3.37 L
FEV6FVC-%Pred-Pre: 103 %
FVC-%Pred-Pre: 68 %
FVC-Pre: 3.5 L
Pre FEV1/FVC ratio: 82 %
Pre FEV6/FVC Ratio: 99 %

## 2014-11-15 LAB — CREATININE, SERUM
Creatinine, Ser: 1.27 mg/dL (ref 0.50–1.35)
GFR calc non Af Amer: 62 mL/min — ABNORMAL LOW (ref >90)
GFR, EST AFRICAN AMERICAN: 72 mL/min — AB (ref >90)

## 2014-11-15 LAB — PROTIME-INR
INR: 1.26 (ref 0.00–1.49)
Prothrombin Time: 15.9 seconds — ABNORMAL HIGH (ref 11.6–15.2)

## 2014-11-15 LAB — PLATELET COUNT: PLATELETS: 137 10*3/uL — AB (ref 150–400)

## 2014-11-15 LAB — HEMOGLOBIN AND HEMATOCRIT, BLOOD
HCT: 33.5 % — ABNORMAL LOW (ref 39.0–52.0)
Hemoglobin: 11.6 g/dL — ABNORMAL LOW (ref 13.0–17.0)

## 2014-11-15 LAB — POCT I-STAT 4, (NA,K, GLUC, HGB,HCT)
Glucose, Bld: 139 mg/dL — ABNORMAL HIGH (ref 70–99)
HEMATOCRIT: 42 % (ref 39.0–52.0)
HEMOGLOBIN: 14.3 g/dL (ref 13.0–17.0)
Potassium: 4.1 mmol/L (ref 3.5–5.1)
SODIUM: 138 mmol/L (ref 135–145)

## 2014-11-15 LAB — POCT ACTIVATED CLOTTING TIME
ACTIVATED CLOTTING TIME: 122 s
ACTIVATED CLOTTING TIME: 325 s

## 2014-11-15 LAB — SURGICAL PCR SCREEN
MRSA, PCR: NEGATIVE
Staphylococcus aureus: POSITIVE — AB

## 2014-11-15 LAB — HEMOGLOBIN A1C
Hgb A1c MFr Bld: 6.6 % — ABNORMAL HIGH (ref 4.8–5.6)
Mean Plasma Glucose: 143 mg/dL

## 2014-11-15 LAB — MAGNESIUM: Magnesium: 2.7 mg/dL — ABNORMAL HIGH (ref 1.5–2.5)

## 2014-11-15 SURGERY — CORONARY ARTERY BYPASS GRAFTING (CABG)
Anesthesia: General | Site: Chest

## 2014-11-15 SURGERY — INTRA-AORTIC BALLOON PUMP INSERTION

## 2014-11-15 MED ORDER — ACETAMINOPHEN 650 MG RE SUPP
650.0000 mg | Freq: Once | RECTAL | Status: AC
  Administered 2014-11-15: 650 mg via RECTAL

## 2014-11-15 MED ORDER — TRAMADOL HCL 50 MG PO TABS
50.0000 mg | ORAL_TABLET | ORAL | Status: DC | PRN
  Administered 2014-11-17 – 2014-11-18 (×3): 100 mg via ORAL
  Filled 2014-11-15 (×3): qty 2

## 2014-11-15 MED ORDER — SODIUM CHLORIDE 0.9 % IV SOLN
INTRAVENOUS | Status: DC
  Administered 2014-11-15: 20 mL via INTRAVENOUS

## 2014-11-15 MED ORDER — CHLORHEXIDINE GLUCONATE 4 % EX LIQD
CUTANEOUS | Status: AC
  Filled 2014-11-15: qty 15

## 2014-11-15 MED ORDER — LACTATED RINGERS IV SOLN
INTRAVENOUS | Status: DC | PRN
  Administered 2014-11-15: 10:00:00 via INTRAVENOUS

## 2014-11-15 MED ORDER — ALBUMIN HUMAN 5 % IV SOLN
12.5000 g | Freq: Once | INTRAVENOUS | Status: AC
  Administered 2014-11-15: 12.5 g via INTRAVENOUS

## 2014-11-15 MED ORDER — MUPIROCIN 2 % EX OINT
1.0000 "application " | TOPICAL_OINTMENT | Freq: Two times a day (BID) | CUTANEOUS | Status: AC
  Administered 2014-11-15 – 2014-11-20 (×10): 1 via NASAL
  Filled 2014-11-15: qty 22

## 2014-11-15 MED ORDER — FENTANYL CITRATE 0.05 MG/ML IJ SOLN
INTRAMUSCULAR | Status: AC
  Filled 2014-11-15: qty 5

## 2014-11-15 MED ORDER — ARTIFICIAL TEARS OP OINT: TOPICAL_OINTMENT | OPHTHALMIC | Status: DC | PRN

## 2014-11-15 MED ORDER — MUPIROCIN 2 % EX OINT: 1.0000 "application " | TOPICAL_OINTMENT | Freq: Two times a day (BID) | CUTANEOUS | Status: DC

## 2014-11-15 MED ORDER — ASPIRIN EC 325 MG PO TBEC
325.0000 mg | DELAYED_RELEASE_TABLET | Freq: Every day | ORAL | Status: DC
  Administered 2014-11-16 – 2014-11-21 (×6): 325 mg via ORAL
  Filled 2014-11-15 (×6): qty 1

## 2014-11-15 MED ORDER — LIDOCAINE HCL (CARDIAC) 20 MG/ML IV SOLN
INTRAVENOUS | Status: AC
  Filled 2014-11-15: qty 5

## 2014-11-15 MED ORDER — MIDAZOLAM HCL 10 MG/2ML IJ SOLN
INTRAMUSCULAR | Status: AC
  Filled 2014-11-15: qty 2

## 2014-11-15 MED ORDER — 0.9 % SODIUM CHLORIDE (POUR BTL) OPTIME
TOPICAL | Status: DC | PRN
  Administered 2014-11-15: 6000 mL

## 2014-11-15 MED ORDER — MIDAZOLAM HCL 2 MG/2ML IJ SOLN
INTRAMUSCULAR | Status: AC
  Filled 2014-11-15: qty 2

## 2014-11-15 MED ORDER — CALCIUM CHLORIDE 10 % IV SOLN
1.0000 g | Freq: Once | INTRAVENOUS | Status: AC
  Administered 2014-11-15: 1 g via INTRAVENOUS
  Filled 2014-11-15: qty 10

## 2014-11-15 MED ORDER — ROCURONIUM BROMIDE 100 MG/10ML IV SOLN
INTRAVENOUS | Status: DC | PRN
  Administered 2014-11-15: 20 mg via INTRAVENOUS
  Administered 2014-11-15: 30 mg via INTRAVENOUS
  Administered 2014-11-15 (×2): 50 mg via INTRAVENOUS

## 2014-11-15 MED ORDER — MIDAZOLAM HCL 5 MG/5ML IJ SOLN
INTRAMUSCULAR | Status: DC | PRN
  Administered 2014-11-15: 2 mg via INTRAVENOUS
  Administered 2014-11-15 (×2): 3 mg via INTRAVENOUS
  Administered 2014-11-15: 2 mg via INTRAVENOUS

## 2014-11-15 MED ORDER — HYDROMORPHONE HCL 1 MG/ML IJ SOLN
0.5000 mg | Freq: Once | INTRAMUSCULAR | Status: DC
  Filled 2014-11-15: qty 1

## 2014-11-15 MED ORDER — HEPARIN SODIUM (PORCINE) 1000 UNIT/ML IJ SOLN
INTRAMUSCULAR | Status: AC
  Filled 2014-11-15: qty 1

## 2014-11-15 MED ORDER — ETOMIDATE 2 MG/ML IV SOLN
INTRAVENOUS | Status: AC
  Filled 2014-11-15: qty 10

## 2014-11-15 MED ORDER — PROTAMINE SULFATE 10 MG/ML IV SOLN
INTRAVENOUS | Status: AC
  Filled 2014-11-15: qty 25

## 2014-11-15 MED ORDER — CHLORHEXIDINE GLUCONATE 0.12 % MT SOLN: 15.0000 mL | Freq: Two times a day (BID) | OROMUCOSAL | Status: DC

## 2014-11-15 MED ORDER — LACTATED RINGERS IV SOLN: 500.0000 mL | Freq: Once | INTRAVENOUS | Status: AC | PRN

## 2014-11-15 MED ORDER — CETYLPYRIDINIUM CHLORIDE 0.05 % MT LIQD
7.0000 mL | Freq: Four times a day (QID) | OROMUCOSAL | Status: DC
  Administered 2014-11-16 (×2): 7 mL via OROMUCOSAL

## 2014-11-15 MED ORDER — MIDAZOLAM HCL 2 MG/2ML IJ SOLN
2.0000 mg | INTRAMUSCULAR | Status: DC | PRN
  Administered 2014-11-16: 2 mg via INTRAVENOUS
  Filled 2014-11-15: qty 2

## 2014-11-15 MED ORDER — LIDOCAINE HCL (CARDIAC) 20 MG/ML IV SOLN
INTRAVENOUS | Status: DC | PRN
  Administered 2014-11-15: 60 mg via INTRAVENOUS

## 2014-11-15 MED ORDER — FAMOTIDINE IN NACL 20-0.9 MG/50ML-% IV SOLN
20.0000 mg | Freq: Two times a day (BID) | INTRAVENOUS | Status: AC
  Administered 2014-11-15 – 2014-11-16 (×2): 20 mg via INTRAVENOUS
  Filled 2014-11-15: qty 50

## 2014-11-15 MED ORDER — LACTATED RINGERS IV SOLN
INTRAVENOUS | Status: DC
Start: 2014-11-15 — End: 2014-11-18
  Administered 2014-11-15: 20 mL via INTRAVENOUS

## 2014-11-15 MED ORDER — HEPARIN SODIUM (PORCINE) 1000 UNIT/ML IJ SOLN
INTRAMUSCULAR | Status: DC | PRN
  Administered 2014-11-15: 23000 [IU] via INTRAVENOUS

## 2014-11-15 MED ORDER — ALBUMIN HUMAN 5 % IV SOLN
INTRAVENOUS | Status: AC
  Administered 2014-11-15: 12.5 g via INTRAVENOUS
  Filled 2014-11-15: qty 250

## 2014-11-15 MED ORDER — MAGNESIUM SULFATE 4 GM/100ML IV SOLN
4.0000 g | Freq: Once | INTRAVENOUS | Status: AC
Start: 2014-11-15 — End: 2014-11-15
  Administered 2014-11-15: 4 g via INTRAVENOUS
  Filled 2014-11-15: qty 100

## 2014-11-15 MED ORDER — BISACODYL 5 MG PO TBEC
10.0000 mg | DELAYED_RELEASE_TABLET | Freq: Every day | ORAL | Status: DC
Start: 2014-11-16 — End: 2014-11-21
  Administered 2014-11-16 – 2014-11-18 (×3): 10 mg via ORAL
  Filled 2014-11-15 (×3): qty 2

## 2014-11-15 MED ORDER — POTASSIUM CHLORIDE 10 MEQ/50ML IV SOLN: 10.0000 meq | INTRAVENOUS | Status: AC

## 2014-11-15 MED ORDER — ACETAMINOPHEN 160 MG/5ML PO SOLN
1000.0000 mg | Freq: Four times a day (QID) | ORAL | Status: AC
  Administered 2014-11-16: 1000 mg
  Filled 2014-11-15: qty 40.6

## 2014-11-15 MED ORDER — ONDANSETRON 8 MG/NS 50 ML IVPB
8.0000 mg | Freq: Once | INTRAVENOUS | Status: AC
  Administered 2014-11-15: 8 mg via INTRAVENOUS
  Filled 2014-11-15: qty 8

## 2014-11-15 MED ORDER — LACTATED RINGERS IV SOLN: INTRAVENOUS | Status: DC

## 2014-11-15 MED ORDER — METOPROLOL TARTRATE 1 MG/ML IV SOLN: 2.5000 mg | INTRAVENOUS | Status: DC | PRN

## 2014-11-15 MED ORDER — NITROGLYCERIN IN D5W 200-5 MCG/ML-% IV SOLN: 0.0000 ug/min | INTRAVENOUS | Status: DC

## 2014-11-15 MED ORDER — PROPOFOL 10 MG/ML IV BOLUS
INTRAVENOUS | Status: AC
  Filled 2014-11-15: qty 20

## 2014-11-15 MED ORDER — PLASMA-LYTE 148 IV SOLN
INTRAVENOUS | Status: DC | PRN
  Administered 2014-11-15: 500 mL via INTRAVASCULAR

## 2014-11-15 MED ORDER — HEPARIN (PORCINE) IN NACL 2-0.9 UNIT/ML-% IJ SOLN
INTRAMUSCULAR | Status: AC
  Filled 2014-11-15: qty 1000

## 2014-11-15 MED ORDER — SODIUM CHLORIDE 0.45 % IV SOLN
INTRAVENOUS | Status: DC | PRN
  Administered 2014-11-15: 16:00:00 via INTRAVENOUS

## 2014-11-15 MED ORDER — INSULIN REGULAR BOLUS VIA INFUSION
0.0000 [IU] | Freq: Three times a day (TID) | INTRAVENOUS | Status: DC
  Filled 2014-11-15: qty 10

## 2014-11-15 MED ORDER — ROCURONIUM BROMIDE 50 MG/5ML IV SOLN
INTRAVENOUS | Status: AC
  Filled 2014-11-15: qty 1

## 2014-11-15 MED ORDER — SODIUM CHLORIDE 0.9 % IV SOLN: 250.0000 mL | INTRAVENOUS | Status: DC

## 2014-11-15 MED ORDER — SODIUM CHLORIDE 0.9 % IJ SOLN
OROMUCOSAL | Status: DC | PRN
  Administered 2014-11-15 (×3): 4 mL via TOPICAL

## 2014-11-15 MED ORDER — DEXMEDETOMIDINE HCL IN NACL 200 MCG/50ML IV SOLN
0.0000 ug/kg/h | INTRAVENOUS | Status: DC
  Administered 2014-11-15 – 2014-11-16 (×2): 0.7 ug/kg/h via INTRAVENOUS
  Administered 2014-11-16: 0.07 ug/kg/h via INTRAVENOUS
  Filled 2014-11-15 (×2): qty 50

## 2014-11-15 MED ORDER — PHENYLEPHRINE HCL 10 MG/ML IJ SOLN
0.0000 ug/min | INTRAVENOUS | Status: DC
  Administered 2014-11-15: 50 ug/min via INTRAVENOUS
  Filled 2014-11-15 (×3): qty 2

## 2014-11-15 MED ORDER — SODIUM CHLORIDE 0.9 % IV BOLUS (SEPSIS)
200.0000 mL | Freq: Once | INTRAVENOUS | Status: AC
  Administered 2014-11-15: 200 mL via INTRAVENOUS

## 2014-11-15 MED ORDER — LEVALBUTEROL HCL 0.63 MG/3ML IN NEBU
0.6300 mg | INHALATION_SOLUTION | Freq: Four times a day (QID) | RESPIRATORY_TRACT | Status: DC
  Administered 2014-11-16 – 2014-11-18 (×10): 0.63 mg via RESPIRATORY_TRACT
  Filled 2014-11-15 (×18): qty 3

## 2014-11-15 MED ORDER — MILRINONE IN DEXTROSE 20 MG/100ML IV SOLN
0.2500 ug/kg/min | INTRAVENOUS | Status: DC
  Filled 2014-11-15: qty 100

## 2014-11-15 MED ORDER — MORPHINE SULFATE 2 MG/ML IJ SOLN
INTRAMUSCULAR | Status: AC
  Administered 2014-11-15: 2 mg via INTRAVENOUS
  Filled 2014-11-15: qty 1

## 2014-11-15 MED ORDER — ALBUTEROL SULFATE (2.5 MG/3ML) 0.083% IN NEBU: 2.5000 mg | INHALATION_SOLUTION | Freq: Four times a day (QID) | RESPIRATORY_TRACT | Status: DC

## 2014-11-15 MED ORDER — ACETAMINOPHEN 500 MG PO TABS
1000.0000 mg | ORAL_TABLET | Freq: Four times a day (QID) | ORAL | Status: AC
  Administered 2014-11-16 – 2014-11-20 (×15): 1000 mg via ORAL
  Filled 2014-11-15 (×21): qty 2

## 2014-11-15 MED ORDER — CHLORHEXIDINE GLUCONATE 0.12 % MT SOLN
OROMUCOSAL | Status: AC
  Administered 2014-11-15: 15 mL
  Filled 2014-11-15: qty 15

## 2014-11-15 MED ORDER — CHLORHEXIDINE GLUCONATE CLOTH 2 % EX PADS
6.0000 | MEDICATED_PAD | Freq: Every day | CUTANEOUS | Status: DC
  Administered 2014-11-16 – 2014-11-20 (×4): 6 via TOPICAL

## 2014-11-15 MED ORDER — ALBUTEROL SULFATE (2.5 MG/3ML) 0.083% IN NEBU
2.5000 mg | INHALATION_SOLUTION | Freq: Once | RESPIRATORY_TRACT | Status: AC
  Administered 2014-11-15: 2.5 mg via RESPIRATORY_TRACT

## 2014-11-15 MED ORDER — ACETAMINOPHEN 10 MG/ML IV SOLN
1000.0000 mg | Freq: Once | INTRAVENOUS | Status: AC
  Administered 2014-11-15: 1000 mg via INTRAVENOUS
  Filled 2014-11-15: qty 100

## 2014-11-15 MED ORDER — FENTANYL CITRATE 0.05 MG/ML IJ SOLN
INTRAMUSCULAR | Status: AC
  Filled 2014-11-15: qty 2

## 2014-11-15 MED ORDER — HEMOSTATIC AGENTS (NO CHARGE) OPTIME
TOPICAL | Status: DC | PRN
  Administered 2014-11-15: 1 via TOPICAL

## 2014-11-15 MED ORDER — PANTOPRAZOLE SODIUM 40 MG PO TBEC
40.0000 mg | DELAYED_RELEASE_TABLET | Freq: Every day | ORAL | Status: DC
  Administered 2014-11-17 – 2014-11-21 (×5): 40 mg via ORAL
  Filled 2014-11-15 (×4): qty 1

## 2014-11-15 MED ORDER — NITROGLYCERIN 1 MG/10 ML FOR IR/CATH LAB
INTRA_ARTERIAL | Status: AC
  Filled 2014-11-15: qty 10

## 2014-11-15 MED ORDER — ETOMIDATE 2 MG/ML IV SOLN
INTRAVENOUS | Status: DC | PRN
  Administered 2014-11-15: 20 mg via INTRAVENOUS

## 2014-11-15 MED ORDER — SODIUM CHLORIDE 0.9 % IV SOLN
INTRAVENOUS | Status: DC
  Administered 2014-11-16: 05:00:00 via INTRAVENOUS
  Administered 2014-11-16: 3.2 [IU]/h via INTRAVENOUS
  Filled 2014-11-15 (×2): qty 2.5

## 2014-11-15 MED ORDER — ASPIRIN 81 MG PO CHEW
324.0000 mg | CHEWABLE_TABLET | Freq: Every day | ORAL | Status: DC
  Filled 2014-11-15: qty 4

## 2014-11-15 MED ORDER — OXYCODONE HCL 5 MG PO TABS
5.0000 mg | ORAL_TABLET | ORAL | Status: DC | PRN
  Administered 2014-11-16 – 2014-11-18 (×5): 10 mg via ORAL
  Administered 2014-11-18: 5 mg via ORAL
  Administered 2014-11-19 (×5): 10 mg via ORAL
  Administered 2014-11-20: 5 mg via ORAL
  Administered 2014-11-20 – 2014-11-21 (×10): 10 mg via ORAL
  Filled 2014-11-15 (×22): qty 2

## 2014-11-15 MED ORDER — PROPOFOL 10 MG/ML IV BOLUS
INTRAVENOUS | Status: DC | PRN
  Administered 2014-11-15: 20 mg via INTRAVENOUS

## 2014-11-15 MED ORDER — METOPROLOL TARTRATE 25 MG/10 ML ORAL SUSPENSION
12.5000 mg | Freq: Two times a day (BID) | ORAL | Status: DC
  Filled 2014-11-15 (×11): qty 5

## 2014-11-15 MED ORDER — ONDANSETRON HCL 4 MG/2ML IJ SOLN
4.0000 mg | Freq: Four times a day (QID) | INTRAMUSCULAR | Status: DC | PRN
  Administered 2014-11-17: 4 mg via INTRAVENOUS
  Filled 2014-11-15: qty 2

## 2014-11-15 MED ORDER — LIDOCAINE HCL (PF) 1 % IJ SOLN
INTRAMUSCULAR | Status: AC
  Filled 2014-11-15: qty 30

## 2014-11-15 MED ORDER — CHLORHEXIDINE GLUCONATE CLOTH 2 % EX PADS: 6.0000 | MEDICATED_PAD | Freq: Every day | CUTANEOUS | Status: DC

## 2014-11-15 MED ORDER — SODIUM CHLORIDE 0.9 % IJ SOLN
3.0000 mL | Freq: Two times a day (BID) | INTRAMUSCULAR | Status: DC
  Administered 2014-11-17 – 2014-11-18 (×3): 3 mL via INTRAVENOUS

## 2014-11-15 MED ORDER — FENTANYL CITRATE 0.05 MG/ML IJ SOLN
INTRAMUSCULAR | Status: DC | PRN
  Administered 2014-11-15: 100 ug via INTRAVENOUS
  Administered 2014-11-15: 200 ug via INTRAVENOUS
  Administered 2014-11-15: 500 ug via INTRAVENOUS
  Administered 2014-11-15: 50 ug via INTRAVENOUS
  Administered 2014-11-15: 150 ug via INTRAVENOUS
  Administered 2014-11-15: 250 ug via INTRAVENOUS

## 2014-11-15 MED ORDER — METOPROLOL TARTRATE 12.5 MG HALF TABLET
12.5000 mg | ORAL_TABLET | Freq: Two times a day (BID) | ORAL | Status: DC
  Administered 2014-11-17 – 2014-11-19 (×6): 12.5 mg via ORAL
  Filled 2014-11-15 (×11): qty 1

## 2014-11-15 MED ORDER — SODIUM CHLORIDE 0.9 % IJ SOLN: 3.0000 mL | INTRAMUSCULAR | Status: DC | PRN

## 2014-11-15 MED ORDER — DOCUSATE SODIUM 100 MG PO CAPS
200.0000 mg | ORAL_CAPSULE | Freq: Every day | ORAL | Status: DC
  Administered 2014-11-16 – 2014-11-21 (×6): 200 mg via ORAL
  Filled 2014-11-15 (×6): qty 2

## 2014-11-15 MED ORDER — MORPHINE SULFATE 2 MG/ML IJ SOLN
2.0000 mg | INTRAMUSCULAR | Status: DC | PRN
  Administered 2014-11-16 (×3): 4 mg via INTRAVENOUS
  Administered 2014-11-16: 2 mg via INTRAVENOUS
  Administered 2014-11-16 (×3): 4 mg via INTRAVENOUS
  Administered 2014-11-17 (×2): 2 mg via INTRAVENOUS
  Administered 2014-11-17 – 2014-11-18 (×3): 4 mg via INTRAVENOUS
  Filled 2014-11-15 (×5): qty 2
  Filled 2014-11-15: qty 1
  Filled 2014-11-15: qty 2
  Filled 2014-11-15: qty 1
  Filled 2014-11-15: qty 2
  Filled 2014-11-15: qty 1
  Filled 2014-11-15 (×3): qty 2
  Filled 2014-11-15: qty 1

## 2014-11-15 MED ORDER — DEXTROSE 5 % IV SOLN
1.5000 g | Freq: Two times a day (BID) | INTRAVENOUS | Status: AC
  Administered 2014-11-16 – 2014-11-17 (×4): 1.5 g via INTRAVENOUS
  Filled 2014-11-15 (×5): qty 1.5

## 2014-11-15 MED ORDER — PROTAMINE SULFATE 10 MG/ML IV SOLN
INTRAVENOUS | Status: DC | PRN
  Administered 2014-11-15: 400 mg via INTRAVENOUS

## 2014-11-15 MED ORDER — ROCURONIUM BROMIDE 50 MG/5ML IV SOLN
INTRAVENOUS | Status: AC
  Filled 2014-11-15: qty 2

## 2014-11-15 MED ORDER — DEXMEDETOMIDINE HCL IN NACL 400 MCG/100ML IV SOLN
0.4000 ug/kg/h | INTRAVENOUS | Status: DC
Start: 2014-11-15 — End: 2014-11-15
  Filled 2014-11-15: qty 100

## 2014-11-15 MED ORDER — SUCCINYLCHOLINE CHLORIDE 20 MG/ML IJ SOLN
INTRAMUSCULAR | Status: AC
  Filled 2014-11-15: qty 1

## 2014-11-15 MED ORDER — BISACODYL 10 MG RE SUPP: 10.0000 mg | Freq: Every day | RECTAL | Status: DC

## 2014-11-15 MED ORDER — METOCLOPRAMIDE HCL 5 MG/ML IJ SOLN
10.0000 mg | Freq: Four times a day (QID) | INTRAMUSCULAR | Status: AC
  Administered 2014-11-15 – 2014-11-16 (×3): 10 mg via INTRAVENOUS
  Filled 2014-11-15 (×2): qty 2

## 2014-11-15 MED ORDER — VANCOMYCIN HCL IN DEXTROSE 1-5 GM/200ML-% IV SOLN
1000.0000 mg | Freq: Once | INTRAVENOUS | Status: AC
  Administered 2014-11-15: 1000 mg via INTRAVENOUS
  Filled 2014-11-15: qty 200

## 2014-11-15 MED ORDER — MORPHINE SULFATE 2 MG/ML IJ SOLN
1.0000 mg | INTRAMUSCULAR | Status: AC | PRN
  Administered 2014-11-15: 4 mg via INTRAVENOUS
  Administered 2014-11-15 (×3): 2 mg via INTRAVENOUS
  Administered 2014-11-16: 4 mg via INTRAVENOUS
  Filled 2014-11-15: qty 1
  Filled 2014-11-15 (×2): qty 2
  Filled 2014-11-15: qty 1

## 2014-11-15 MED ORDER — ACETAMINOPHEN 160 MG/5ML PO SOLN: 650.0000 mg | Freq: Once | ORAL | Status: AC

## 2014-11-15 MED ORDER — DOPAMINE-DEXTROSE 3.2-5 MG/ML-% IV SOLN
3.0000 ug/kg/min | INTRAVENOUS | Status: DC
  Administered 2014-11-15: 3 ug/kg/min via INTRAVENOUS

## 2014-11-15 MED ORDER — ALBUMIN HUMAN 5 % IV SOLN
250.0000 mL | INTRAVENOUS | Status: AC | PRN
Start: 2014-11-15 — End: 2014-11-15
  Administered 2014-11-15 (×4): 250 mL via INTRAVENOUS
  Filled 2014-11-15 (×2): qty 250

## 2014-11-15 SURGICAL SUPPLY — 89 items
ADAPTER CARDIO PERF ANTE/RETRO (ADAPTER) ×4 IMPLANT
BAG DECANTER FOR FLEXI CONT (MISCELLANEOUS) ×4 IMPLANT
BANDAGE ELASTIC 4 VELCRO ST LF (GAUZE/BANDAGES/DRESSINGS) ×4 IMPLANT
BANDAGE ELASTIC 6 VELCRO ST LF (GAUZE/BANDAGES/DRESSINGS) ×4 IMPLANT
BLADE STERNUM SYSTEM 6 (BLADE) ×4 IMPLANT
BLADE SURG 11 STRL SS (BLADE) ×4 IMPLANT
BLADE SURG 15 STRL LF DISP TIS (BLADE) ×2 IMPLANT
BLADE SURG 15 STRL SS (BLADE) ×2
BNDG GAUZE ELAST 4 BULKY (GAUZE/BANDAGES/DRESSINGS) ×4 IMPLANT
CANISTER SUCTION 2500CC (MISCELLANEOUS) ×4 IMPLANT
CANNULA EZ GLIDE 8.0 24FR (CANNULA) ×4 IMPLANT
CATH CPB KIT GERHARDT (MISCELLANEOUS) ×4 IMPLANT
CATH THORACIC 28FR (CATHETERS) ×4 IMPLANT
CLIP TI WIDE RED SMALL 24 (CLIP) ×8 IMPLANT
CRADLE DONUT ADULT HEAD (MISCELLANEOUS) ×4 IMPLANT
DERMABOND ADVANCED (GAUZE/BANDAGES/DRESSINGS) ×4
DERMABOND ADVANCED .7 DNX12 (GAUZE/BANDAGES/DRESSINGS) ×4 IMPLANT
DRAIN CHANNEL 28F RND 3/8 FF (WOUND CARE) ×4 IMPLANT
DRAPE CARDIOVASCULAR INCISE (DRAPES) ×2
DRAPE SLUSH/WARMER DISC (DRAPES) ×4 IMPLANT
DRAPE SRG 135X102X78XABS (DRAPES) ×2 IMPLANT
DRSG AQUACEL AG ADV 3.5X14 (GAUZE/BANDAGES/DRESSINGS) ×4 IMPLANT
DRSG COVADERM 4X14 (GAUZE/BANDAGES/DRESSINGS) ×4 IMPLANT
ELECT BLADE 4.0 EZ CLEAN MEGAD (MISCELLANEOUS) ×4
ELECT REM PT RETURN 9FT ADLT (ELECTROSURGICAL) ×8
ELECTRODE BLDE 4.0 EZ CLN MEGD (MISCELLANEOUS) ×2 IMPLANT
ELECTRODE REM PT RTRN 9FT ADLT (ELECTROSURGICAL) ×4 IMPLANT
GAUZE SPONGE 4X4 12PLY STRL (GAUZE/BANDAGES/DRESSINGS) ×8 IMPLANT
GLOVE BIO SURGEON STRL SZ 6 (GLOVE) ×16 IMPLANT
GLOVE BIO SURGEON STRL SZ 6.5 (GLOVE) IMPLANT
GLOVE BIO SURGEON STRL SZ7 (GLOVE) ×4 IMPLANT
GLOVE BIO SURGEONS STRL SZ 6.5 (GLOVE)
GLOVE BIOGEL PI IND STRL 6 (GLOVE) ×2 IMPLANT
GLOVE BIOGEL PI IND STRL 6.5 (GLOVE) ×2 IMPLANT
GLOVE BIOGEL PI IND STRL 7.0 (GLOVE) ×8 IMPLANT
GLOVE BIOGEL PI INDICATOR 6 (GLOVE) ×2
GLOVE BIOGEL PI INDICATOR 6.5 (GLOVE) ×2
GLOVE BIOGEL PI INDICATOR 7.0 (GLOVE) ×8
GLOVE SURG SIGNA 7.5 PF LTX (GLOVE) ×12 IMPLANT
GOWN STRL REUS W/ TWL LRG LVL3 (GOWN DISPOSABLE) ×12 IMPLANT
GOWN STRL REUS W/ TWL XL LVL3 (GOWN DISPOSABLE) ×4 IMPLANT
GOWN STRL REUS W/TWL LRG LVL3 (GOWN DISPOSABLE) ×12
GOWN STRL REUS W/TWL XL LVL3 (GOWN DISPOSABLE) ×4
HEMOSTAT POWDER SURGIFOAM 1G (HEMOSTASIS) ×12 IMPLANT
HEMOSTAT SURGICEL 2X14 (HEMOSTASIS) ×4 IMPLANT
KIT BASIN OR (CUSTOM PROCEDURE TRAY) ×4 IMPLANT
KIT CATH SUCT 8FR (CATHETERS) IMPLANT
KIT ROOM TURNOVER OR (KITS) ×4 IMPLANT
KIT SUCTION CATH 14FR (SUCTIONS) ×12 IMPLANT
KIT VASOVIEW W/TROCAR VH 2000 (KITS) ×4 IMPLANT
LEAD PACING MYOCARDI (MISCELLANEOUS) ×4 IMPLANT
LIQUID BAND (GAUZE/BANDAGES/DRESSINGS) ×4 IMPLANT
MARKER GRAFT CORONARY BYPASS (MISCELLANEOUS) ×12 IMPLANT
NS IRRIG 1000ML POUR BTL (IV SOLUTION) ×24 IMPLANT
PACK OPEN HEART (CUSTOM PROCEDURE TRAY) ×4 IMPLANT
PAD ARMBOARD 7.5X6 YLW CONV (MISCELLANEOUS) ×8 IMPLANT
PAD ELECT DEFIB RADIOL ZOLL (MISCELLANEOUS) ×4 IMPLANT
PENCIL BUTTON HOLSTER BLD 10FT (ELECTRODE) ×4 IMPLANT
PUNCH AORTIC ROTATE 4.5MM 8IN (MISCELLANEOUS) ×4 IMPLANT
SET CARDIOPLEGIA MPS 5001102 (MISCELLANEOUS) ×4 IMPLANT
SOLUTION ANTI FOG 6CC (MISCELLANEOUS) ×4 IMPLANT
SUT BONE WAX W31G (SUTURE) ×4 IMPLANT
SUT MNCRL AB 4-0 PS2 18 (SUTURE) ×8 IMPLANT
SUT PROLENE 3 0 SH1 36 (SUTURE) ×4 IMPLANT
SUT PROLENE 4 0 RB 1 (SUTURE) ×6
SUT PROLENE 4 0 TF (SUTURE) ×8 IMPLANT
SUT PROLENE 4-0 RB1 .5 CRCL 36 (SUTURE) ×6 IMPLANT
SUT PROLENE 6 0 C 1 30 (SUTURE) ×4 IMPLANT
SUT PROLENE 6 0 CC (SUTURE) ×8 IMPLANT
SUT PROLENE 7 0 BV1 MDA (SUTURE) ×8 IMPLANT
SUT SILK 2 0 TIES 10X30 (SUTURE) ×4 IMPLANT
SUT SILK 4 0 TIE 10X30 (SUTURE) ×4 IMPLANT
SUT STEEL 6MS V (SUTURE) ×4 IMPLANT
SUT STEEL SZ 6 DBL 3X14 BALL (SUTURE) ×4 IMPLANT
SUT VIC AB 1 CTX 18 (SUTURE) ×8 IMPLANT
SUT VIC AB 1 CTX 36 (SUTURE) ×4
SUT VIC AB 1 CTX36XBRD ANBCTR (SUTURE) ×4 IMPLANT
SUT VIC AB 2-0 CT1 27 (SUTURE) ×4
SUT VIC AB 2-0 CT1 TAPERPNT 27 (SUTURE) ×4 IMPLANT
SUTURE E-PAK OPEN HEART (SUTURE) ×4 IMPLANT
SYSTEM SAHARA CHEST DRAIN ATS (WOUND CARE) ×4 IMPLANT
TAPE CLOTH SURG 4X10 WHT LF (GAUZE/BANDAGES/DRESSINGS) ×4 IMPLANT
TOWEL OR 17X24 6PK STRL BLUE (TOWEL DISPOSABLE) ×8 IMPLANT
TOWEL OR 17X26 10 PK STRL BLUE (TOWEL DISPOSABLE) ×8 IMPLANT
TRAY FOLEY IC TEMP SENS 16FR (CATHETERS) ×4 IMPLANT
TUBING INSUFFLATION (TUBING) ×4 IMPLANT
UNDERPAD 30X30 INCONTINENT (UNDERPADS AND DIAPERS) ×4 IMPLANT
WATER STERILE IRR 1000ML POUR (IV SOLUTION) ×8 IMPLANT
YANKAUER SUCT BULB TIP NO VENT (SUCTIONS) ×4 IMPLANT

## 2014-11-15 NOTE — Progress Notes (Signed)
      [address]       [place] [address]             [phone]      54 yo man who was scheduled for elective CABG later this afternoon by EBG.  He presented over the weekend with a NSTEMI. At cath yesterday he had severe 2 vessel CAD  This morning he had recurrent CP. ECG showed ST elevation. He is currently in cath lab for a relook cath and IABP. I informed him we would need to go directly to the OR once IABP in place.  He is aware of the indications, risks, benefits and alternatives.  OR has been notified and preparations are underway.  Salvatore Decent Dorris Fetch, MD [facility] [phone]

## 2014-11-15 NOTE — Progress Notes (Signed)
[facility] BRIEF SICU PROGRESS NOTE  Day of Surgery  S/P Procedure(s) (LRB): CORONARY ARTERY BYPASS GRAFTING (CABG), ON PUMP, TIMES THREE, USING LEFT INTERNAL MAMMARY ARTERY, RIGHT GREATER SAPHENOUS VEIN HARVESTED ENDOSCOPICALLY (N/A) TRANSESOPHAGEAL ECHOCARDIOGRAM (TEE) (N/A)   Sedated on vent AAI paced w/ stable hemodynamics, IABP@ 1:1 Chest tube output low UOP excellent Labs okay  Plan: Continue routine early postop  Purcell Nails 11/15/2014 10:45 PM

## 2014-11-15 NOTE — CV Procedure (Signed)
Seth Riddle is a 54 y.o. male    [id number]  [id number] LOCATION:  FACILITY: [facility]  PHYSICIAN: Lennette Bihari, MD, [facility] 14-Apr-1961   DATE OF PROCEDURE:  11/15/2014    EMERGENT PREOPERATIVE IABP/PTCA OF LAD    HISTORY:    Seth Riddle is a 54 y.o. male who developed recent episodes of chest pain.  He was admitted 2 days ago and ruled in for non-ST segment elevation MI.  He underwent diagnostic cardiac catheterization late yesterday and was found to have high-grade multivessel CAD.  CABG surgery was recommended.  He was seen by Dr. Tyrone Sage and was scheduled to undergo elective CBG surgery later today.  Apparently during the evening he developed recurrent chest pain despite nitroglycerin and IV heparin.  ECG this a.m. showed early acute anterior ST elevation.  He is now taken emergently to the catheterization laboratory for preoperative intra-aortic balloon pump insertion and possible PTCA to restore blood flow to his LAD system if occluded with plans for emergent CABG surgery by Dr. Dorris Fetch to follow.   PROCEDURE:  The patient was brought to the catheterization laboratory complaining of continued chest pain.  He had been on nitroglycerin and heparin.  His right femoral artery was punctured anteriorly and a 6 French arterial sheath was inserted.  A 6 [place] XB LAD guide was inserted and angiography revealed that the LAD vessel was now occluded just beyond the.the high diagonal (ramus immediately like) vessel takeoff.  There was still high-grade stenosis in this diagonal vessel.  The left groin was then prepped and draped.  A 7.5 French 40 cc Datascope intra balloon pump was inserted without difficulty and advanced to just beyond the aortic knob in the thoracic aorta and 1:1 counterpulsation was employed.  Systolic blood pressure which had been 90-95 was now augmenting to 104-110.  Attention was then directed at reestablishing blood flow to the LAD to reduce infarct size prior to  emergent CABG surgery.  5000 units of heparin was administered. ACT was therapeutic.  An Asahi medium wire was advanced into the left main and positioned in the diagonal vessel.  A second Asahi medium wire was then advanced and was able to navigate into the totally occluded LAD.  With balloon support the LAD occlusion was crossed and dilatation was done with a 2.015 mm balloon.  Several inflations were made.  The balloon was then upgraded to 2.515 mm Euphora balloon.  Several dilatations were made.  With establishment of TIMI-3 flow and a patent LAD despite diffuse distal disease the PTCA balloon was removed.  The guiding catheter and wires down the diagonal and LAD were left in place.  The arterial sheath and IABP sheaths were sutured in place.  The patient was transported to the operating room for CABG surgery with Dr. Dorris Fetch.   HEMODYNAMICS:   Central Aorta: Initial 92/33  Following IABP the augmented BP was 104-110.    ANGIOGRAPHY:  Left main was a large normal vessel which gave rise to the LAD and left circumflex vessel.  The LAD immediately gave rise to a large first diagonal branch which had a ramus intermediate, like distribution is noted in yesterday's cath had stenosis of 90%.  The LAD, which had 95% stenosis at the diagonal takeoff and beyond was now completely occluded after this high diagonal vessel.  There was TIMI 0 flow.  The circumflex vessel was unchanged and had previously noted 90% stenosis in the proximal portion of the obtuse marginal vessel and diffuse distal  marginal disease.  Since the LAD was occluded with TIMI 0 flow, emergent PTCA was performed to reestablish blood flow and reduce infarct size prior to emergent CABG surgery.  Following PTCA with a 2.0 and 2.5 mm balloon.  The 100% occlusion was reduced to oxalate 50%.  This was purposely not dilated further, since the patient will undergo a LIMA graft to the LAD.  There was reestablishment of TIMI-3 flow.  The  distal LAD had diffuse 80% stenosis prior to the apex.   IMPRESSION:  Acute ST segment elevation anterior wall myocardial infarction secondary to total proximal LAD occlusion immediately after the high diagonal (ramus intermediate like) vessel in this patient awaiting CABG surgery which had been scheduled for later today.  Successful insertion of intrinsic balloon pump with 1-1 counterpulsation.  Successful PTCA of the LAD with restoration of TIMI 3 flow with 100% occlusion being reduced to approximately 50% with plans for immediate CABG revascularization surgery.    Lennette Bihari, MD, [facility] 11/15/2014 9:53 AM

## 2014-11-15 NOTE — Progress Notes (Signed)
Called by RN re: chest pain  Pt assessed. Physical exam unchanged, but pt with ongoing chest pain, uncontrolled by NTG at 30 mcg/min and morphine.   ECG obtained and shows ST elevation in anterior leads  Contacted Dr [place], who recommended a balloon pump and possible wiring to keep vessel open. Cath lab contacted for this.  Updated Dr Tyrone Sage, who recommended getting Dr Dorris Fetch to cancel the office and do Mr Krol emergently.  Dr Dorris Fetch contacted, in agreement.  Dr Tresa Endo contacted, he will do the balloon pump.  Updated patient and family.  Pt taken emergently to the cath lab and will go from there to the OR.  Theodore Demark, PA-C 11/15/2014 8:28 AM Beeper [phone]  Patient seen and examined and history reviewed. Agree with above findings and plan. Patient with chest pain off and on throughout the night. Worsening chest pain this am. Ecg shows anterior ST elevation. Will take emergently to cath lab for IABP +/- PCI if no LAD flow. Dr. Dorris Fetch to see for emergent CABG.   Peter [place]aziland, MDFACC 11/15/2014 8:42 AM

## 2014-11-15 NOTE — Significant Event (Signed)
Rapid Response Event Note  Overview: Time Called: 0756 Arrival Time: 0800 Event Type: Cardiac  Initial Focused Assessment:  Called by RN to assist with patient, with CP, st elevation and on nitro drip.  Upon my arrival to room, RN, family and MD at bedside.  Patient gray and diaphoretic.     Interventions:  Cath lab team at bedside, patient transported to lab by team   Event Summary: transported to cath lab by team    at      at          [facility], Maryagnes Amos

## 2014-11-15 NOTE — Anesthesia Preprocedure Evaluation (Addendum)
Anesthesia Evaluation  Patient identified by MRN, date of birth, ID band Patient awake    Reviewed: Allergy & Precautions, NPO status , Patient's Chart, lab work & pertinent test results  Airway Mallampati: II  TM Distance: >3 FB Neck ROM: Full    Dental no notable dental hx.    Pulmonary neg pulmonary ROS,  breath sounds clear to auscultation  Pulmonary exam normal       Cardiovascular + CAD and + Past MI Rhythm:Regular Rate:Normal  Echo 11/14/2014 - Left ventricle: The cavity size was normal. Wall thickness wasincreased in a pattern of mild LVH. Systolic function was mildlyreduced. The estimated ejection fraction was in the range of 45%to 50%. Diffuse hypokinesis. Doppler parameters are consistentwith abnormal left ventricular relaxation (grade 1 diastolicdysfunction). - Right ventricle: The cavity size was mildly dilated. - Right atrium: The atrium was mildly dilated.  Impressions: - Mild global reduction in LV function (EF 50); grade 1 diastolicdysfunction; mild RAE/RVE.    Neuro/Psych negative neurological ROS  negative psych ROS   GI/Hepatic negative GI ROS, Neg liver ROS,   Endo/Other  negative endocrine ROS  Renal/GU negative Renal ROS     Musculoskeletal negative musculoskeletal ROS (+)   Abdominal (+) + obese,   Peds  Hematology negative hematology ROS (+)   Anesthesia Other Findings   Reproductive/Obstetrics negative OB ROS                            Anesthesia Physical Anesthesia Plan  ASA: IV  Anesthesia Plan: General   Post-op Pain Management:    Induction: Intravenous  Airway Management Planned: Oral ETT  Additional Equipment: Arterial line, TEE, CVP, PA Cath and Ultrasound Guidance Line Placement  Intra-op Plan:   Post-operative Plan: Post-operative intubation/ventilation  Informed Consent: I have reviewed the patients History and Physical, chart, labs  and discussed the procedure including the risks, benefits and alternatives for the proposed anesthesia with the patient or authorized representative who has indicated his/her understanding and acceptance.   Dental advisory given  Plan Discussed with: CRNA  Anesthesia Plan Comments:         Anesthesia Quick Evaluation

## 2014-11-15 NOTE — Progress Notes (Signed)
Pt c/o 5/10 chest pressure, neck pain at 7:30 am upon RN assessment. Pt awaiting CABG today. Theodore Demark, PA, notified. Pt on IV nitro at 22mcg/min , IV heparin at 16, received 2 mg morphine at 0329 am. BP 104/50. Orders received to give 200 cc bolus and 0.5 mg of dilaudid. Theodore Demark, PA arrived to bedside to assess pt. EKG obtained, with changes. 0.5 mg Dilaudid held pending pt transfer to cath lab.  Dr. Tresa Endo, Dr. [place], Rhonda Barrett, PA at bedside. Pt transferred to cath lab, consent signed prior to transfer. Joylene Grapes

## 2014-11-15 NOTE — Progress Notes (Signed)
Pt resting comfortably. IV Heparin and NTG infusing, will cont to monitor closely. Dierdre Highman, RN

## 2014-11-15 NOTE — Progress Notes (Signed)
Inpatient Diabetes Program Recommendations  AACE/ADA: New Consensus Statement on Inpatient Glycemic Control (2013)  Target Ranges:  Prepandial:   less than 140 mg/dL      Peak postprandial:   less than 180 mg/dL (1-2 hours)      Critically ill patients:  140 - 180 mg/dL     Results for [facility], [facility] P (MRN [id number]) as of 11/15/2014 07:21  Ref. Range 11/13/2014 17:18  Hemoglobin A1C Latest Range: 4.8-5.6 % 6.6 (H)     Admit with: CP/ NSTEMI  History: CAD    **No History of DM noted in H&P.   **Patient weight= 122 kg.  Patient is at high risk for Type 2 DM given his weight and cardiac risk factors.    MD- Per ADA Standards of Care/ Guidelines- A1c of 6.5% or greater is indicative of positive diagnosis of DM.  Please address with patient.  He will need major lifestyle modifications after surgery.    Will follow Ambrose Finland RN, MSN, CDE Diabetes Coordinator Inpatient Diabetes Program Team Pager: [phone] (8a-5p)

## 2014-11-15 NOTE — OR Nursing (Signed)
First call to SICU at 1343.

## 2014-11-15 NOTE — Transfer of Care (Signed)
Immediate Anesthesia Transfer of Care Note  Patient: Seth Riddle  Procedure(s) Performed: Procedure(s): CORONARY ARTERY BYPASS GRAFTING (CABG), ON PUMP, TIMES THREE, USING LEFT INTERNAL MAMMARY ARTERY, RIGHT GREATER SAPHENOUS VEIN HARVESTED ENDOSCOPICALLY (N/A) TRANSESOPHAGEAL ECHOCARDIOGRAM (TEE) (N/A)  Patient Location: ICU  Anesthesia Type:General  Level of Consciousness: unresponsive and Patient remains intubated per anesthesia plan  Airway & Oxygen Therapy: Patient remains intubated per anesthesia plan and Patient placed on Ventilator (see vital sign flow sheet for setting)  Post-op Assessment: Report given to RN and Post -op Vital signs reviewed and stable  Post vital signs: Reviewed and stable  Last Vitals:  Filed Vitals:   11/15/14 0831  BP:   Pulse: 82  Temp:   Resp:     Complications: No apparent anesthesia complications

## 2014-11-15 NOTE — Brief Op Note (Addendum)
11/13/2014 - 11/15/2014  1:04 PM  PATIENT:  Seth Riddle  54 y.o. male  PRE-OPERATIVE DIAGNOSIS:  Severe 2 vessel CAD anterior STEMI  POST-OPERATIVE DIAGNOSIS:  Severe 2 vessel CAD anterior STEMI  PROCEDURE:  Procedure(s):  CORONARY ARTERY BYPASS GRAFTING x 3 -LIMA to LAD -SVG to DIAGONAL 1 -SVG to OM1  ENDOSCOPIC SAPHENOUS VEIN HARVEST RIGHT THIGH -Open Skip incision mid thigh  TRANSESOPHAGEAL ECHOCARDIOGRAM (TEE) (N/A)  SURGEON:  Surgeon(s) and Role:    * Loreli Slot, MD - Primary  PHYSICIAN ASSISTANT: Erin Barrett PA-C  ANESTHESIA:   general  EBL:  Total I/O In: 700 [I.V.:700] Out: 550 [Urine:550]  BLOOD ADMINISTERED: CELLSAVER  DRAINS: Left Pleural Chest Tubes, Mediastinal Chest Drains   LOCAL MEDICATIONS USED:  NONE  SPECIMEN:  No Specimen  DISPOSITION OF SPECIMEN:  N/A  COUNTS:  YES  PLAN OF CARE: Admit to inpatient   PATIENT DISPOSITION:  ICU - intubated and hemodynamically stable.   Delay start of Pharmacological VTE agent (>24hrs) due to surgical blood loss or risk of bleeding: yes  XC= 58 min CPB= 102 min TEE- EF 40% with anterior hypokinesis, no valvular pathology Good targets and conduits  Seth Riddle C. Dorris Fetch, MD [facility] [phone]

## 2014-11-15 NOTE — Progress Notes (Signed)
0242 pt states HA is coming back and Chest pressure is starting to return.  BP 106/62 HR 78 O2 sat 99%.  Additional 2 MG morphine given per previous order for 2-4 mg PRN.  Pt continued to complain that Chest Pressure was" not getting any better but not any worse".  Dr Alphonsus Sias paged and asked to come assess patient. MD came to floor and assessed  Pt and spoke with family.  NTG increased from 9cc/h to 11.5cc/h (44mcg/min) per MD and also stated to give pt additional 2mg  IV morphine.  Shortly after giving additional Morphine pt became nauseated and vomited approx 100 cc of bile and partially digested green beans.  BP at that time was 95/63. NTG decreased back to 9cc/hr(30 mcg/min) and EKG obtained per MD.BP100/64  Pt states Chest pressure improved after vomiting. EKG shown to MD no further orders at this time. Will continue to monitor closely. Dierdre Highman, RN

## 2014-11-15 NOTE — OR Nursing (Signed)
Second call to SICU at 1413.

## 2014-11-15 NOTE — Progress Notes (Signed)
IV Heparin restarted per post cath orders. Pt states chest and jaw pressure feels much better and HA has resolved.  Pt currently resting comfortably. Family remains at bedside. Will continue to monitor. Dierdre Highman, RN

## 2014-11-15 NOTE — Anesthesia Postprocedure Evaluation (Signed)
  Anesthesia Post-op Note  Patient: Seth Riddle  Procedure(s) Performed: Procedure(s): CORONARY ARTERY BYPASS GRAFTING (CABG), ON PUMP, TIMES THREE, USING LEFT INTERNAL MAMMARY ARTERY, RIGHT GREATER SAPHENOUS VEIN HARVESTED ENDOSCOPICALLY (N/A) TRANSESOPHAGEAL ECHOCARDIOGRAM (TEE) (N/A)  Patient Location: SICU  Anesthesia Type:General  Level of Consciousness: sedated and Patient remains intubated per anesthesia plan  Airway and Oxygen Therapy: Patient remains intubated per anesthesia plan and Patient placed on Ventilator (see vital sign flow sheet for setting)  Post-op Pain: none  Post-op Assessment: Post-op Vital signs reviewed  Post-op Vital Signs: Reviewed  Last Vitals:  Filed Vitals:   11/15/14 1730  BP:   Pulse: 89  Temp: 37.9 C  Resp: 14    Complications: No apparent anesthesia complications

## 2014-11-15 NOTE — Progress Notes (Signed)
Asked to see Patient per floor RN regarding ongoing chest pain, jaw pain and head ache. EKG completed prior to my arrival with no changes from 2045 EKG with ST elevation. Pt awake alert oriented, denies SOB po2 99% on 4 LNC. Cardiology paged and updated on pt status. Morphine IV ordered and given. Pt and family updated on plan of care tonight. Floor RN advised to monitor Patient closely and notify myself and provider for worsening changes.

## 2014-11-16 ENCOUNTER — Encounter (HOSPITAL_COMMUNITY): Payer: Self-pay | Admitting: Cardiovascular Disease

## 2014-11-16 ENCOUNTER — Inpatient Hospital Stay (HOSPITAL_COMMUNITY): Payer: BC Managed Care – PPO

## 2014-11-16 ENCOUNTER — Inpatient Hospital Stay (HOSPITAL_COMMUNITY): Payer: BC Managed Care – PPO | Admitting: Certified Registered"

## 2014-11-16 DIAGNOSIS — Z951 Presence of aortocoronary bypass graft: Secondary | ICD-10-CM

## 2014-11-16 LAB — CBC
HCT: 40.5 % (ref 39.0–52.0)
HCT: 37.8 % — ABNORMAL LOW (ref 39.0–52.0)
HEMOGLOBIN: 13.8 g/dL (ref 13.0–17.0)
HEMOGLOBIN: 12.8 g/dL — AB (ref 13.0–17.0)
MCH: 30.4 pg (ref 26.0–34.0)
MCH: 30.3 pg (ref 26.0–34.0)
MCHC: 34.1 g/dL (ref 30.0–36.0)
MCHC: 33.9 g/dL (ref 30.0–36.0)
MCV: 89.2 fL (ref 78.0–100.0)
MCV: 89.4 fL (ref 78.0–100.0)
PLATELETS: 104 10*3/uL — AB (ref 150–400)
Platelets: 83 10*3/uL — ABNORMAL LOW (ref 150–400)
RBC: 4.54 MIL/uL (ref 4.22–5.81)
RBC: 4.23 MIL/uL (ref 4.22–5.81)
RDW: 12.9 % (ref 11.5–15.5)
RDW: 13 % (ref 11.5–15.5)
WBC: 13.8 10*3/uL — ABNORMAL HIGH (ref 4.0–10.5)
WBC: 11.8 10*3/uL — ABNORMAL HIGH (ref 4.0–10.5)

## 2014-11-16 LAB — POCT I-STAT 3, ART BLOOD GAS (G3+)
ACID-BASE DEFICIT: 2 mmol/L (ref 0.0–2.0)
Acid-base deficit: 2 mmol/L (ref 0.0–2.0)
Acid-base deficit: 2 mmol/L (ref 0.0–2.0)
Acid-base deficit: 4 mmol/L — ABNORMAL HIGH (ref 0.0–2.0)
Acid-base deficit: 4 mmol/L — ABNORMAL HIGH (ref 0.0–2.0)
Acid-base deficit: 5 mmol/L — ABNORMAL HIGH (ref 0.0–2.0)
BICARBONATE: 20.8 meq/L (ref 20.0–24.0)
BICARBONATE: 22.1 meq/L (ref 20.0–24.0)
BICARBONATE: 22.3 meq/L (ref 20.0–24.0)
Bicarbonate: 22.1 mEq/L (ref 20.0–24.0)
Bicarbonate: 20.8 mEq/L (ref 20.0–24.0)
Bicarbonate: 22.8 mEq/L (ref 20.0–24.0)
O2 SAT: 93 %
O2 SAT: 91 %
O2 SAT: 90 %
O2 Saturation: 91 %
O2 Saturation: 97 %
O2 Saturation: 98 %
PCO2 ART: 38.3 mmHg (ref 35.0–45.0)
PCO2 ART: 41.4 mmHg (ref 35.0–45.0)
PH ART: 7.312 — AB (ref 7.350–7.450)
PO2 ART: 117 mmHg — AB (ref 80.0–100.0)
PO2 ART: 105 mmHg — AB (ref 80.0–100.0)
Patient temperature: 38.2
Patient temperature: 37.3
TCO2: 22 mmol/L (ref 0–100)
TCO2: 22 mmol/L (ref 0–100)
TCO2: 23 mmol/L (ref 0–100)
TCO2: 23 mmol/L (ref 0–100)
TCO2: 23 mmol/L (ref 0–100)
TCO2: 24 mmol/L (ref 0–100)
pCO2 arterial: 38.6 mmHg (ref 35.0–45.0)
pCO2 arterial: 38.7 mmHg (ref 35.0–45.0)
pCO2 arterial: 40 mmHg (ref 35.0–45.0)
pCO2 arterial: 43.8 mmHg (ref 35.0–45.0)
pH, Arterial: 7.316 — ABNORMAL LOW (ref 7.350–7.450)
pH, Arterial: 7.345 — ABNORMAL LOW (ref 7.350–7.450)
pH, Arterial: 7.361 (ref 7.350–7.450)
pH, Arterial: 7.375 (ref 7.350–7.450)
pH, Arterial: 7.379 (ref 7.350–7.450)
pO2, Arterial: 62 mmHg — ABNORMAL LOW (ref 80.0–100.0)
pO2, Arterial: 67 mmHg — ABNORMAL LOW (ref 80.0–100.0)
pO2, Arterial: 70 mmHg — ABNORMAL LOW (ref 80.0–100.0)
pO2, Arterial: 75 mmHg — ABNORMAL LOW (ref 80.0–100.0)

## 2014-11-16 LAB — GLUCOSE, CAPILLARY
GLUCOSE-CAPILLARY: 115 mg/dL — AB (ref 70–99)
GLUCOSE-CAPILLARY: 130 mg/dL — AB (ref 70–99)
GLUCOSE-CAPILLARY: 112 mg/dL — AB (ref 70–99)
GLUCOSE-CAPILLARY: 104 mg/dL — AB (ref 70–99)
GLUCOSE-CAPILLARY: 118 mg/dL — AB (ref 70–99)
GLUCOSE-CAPILLARY: 103 mg/dL — AB (ref 70–99)
GLUCOSE-CAPILLARY: 113 mg/dL — AB (ref 70–99)
Glucose-Capillary: 102 mg/dL — ABNORMAL HIGH (ref 70–99)
Glucose-Capillary: 118 mg/dL — ABNORMAL HIGH (ref 70–99)
Glucose-Capillary: 120 mg/dL — ABNORMAL HIGH (ref 70–99)
Glucose-Capillary: 122 mg/dL — ABNORMAL HIGH (ref 70–99)
Glucose-Capillary: 124 mg/dL — ABNORMAL HIGH (ref 70–99)
Glucose-Capillary: 129 mg/dL — ABNORMAL HIGH (ref 70–99)
Glucose-Capillary: 130 mg/dL — ABNORMAL HIGH (ref 70–99)
Glucose-Capillary: 144 mg/dL — ABNORMAL HIGH (ref 70–99)
Glucose-Capillary: 147 mg/dL — ABNORMAL HIGH (ref 70–99)
Glucose-Capillary: 106 mg/dL — ABNORMAL HIGH (ref 70–99)
Glucose-Capillary: 113 mg/dL — ABNORMAL HIGH (ref 70–99)
Glucose-Capillary: 126 mg/dL — ABNORMAL HIGH (ref 70–99)
Glucose-Capillary: 111 mg/dL — ABNORMAL HIGH (ref 70–99)
Glucose-Capillary: 111 mg/dL — ABNORMAL HIGH (ref 70–99)
Glucose-Capillary: 117 mg/dL — ABNORMAL HIGH (ref 70–99)
Glucose-Capillary: 124 mg/dL — ABNORMAL HIGH (ref 70–99)
Glucose-Capillary: 129 mg/dL — ABNORMAL HIGH (ref 70–99)

## 2014-11-16 LAB — POCT I-STAT, CHEM 8
BUN: 19 mg/dL (ref 6–23)
CHLORIDE: 102 mmol/L (ref 96–112)
Calcium, Ion: 1.26 mmol/L — ABNORMAL HIGH (ref 1.12–1.23)
Creatinine, Ser: 1 mg/dL (ref 0.50–1.35)
Glucose, Bld: 135 mg/dL — ABNORMAL HIGH (ref 70–99)
HCT: 38 % — ABNORMAL LOW (ref 39.0–52.0)
HEMOGLOBIN: 12.9 g/dL — AB (ref 13.0–17.0)
POTASSIUM: 3.7 mmol/L (ref 3.5–5.1)
Sodium: 140 mmol/L (ref 135–145)
TCO2: 22 mmol/L (ref 0–100)

## 2014-11-16 LAB — BASIC METABOLIC PANEL
Anion gap: 4 — ABNORMAL LOW (ref 5–15)
BUN: 14 mg/dL (ref 6–23)
CO2: 23 mmol/L (ref 19–32)
CREATININE: 1.15 mg/dL (ref 0.50–1.35)
Calcium: 8.5 mg/dL (ref 8.4–10.5)
Chloride: 110 mmol/L (ref 96–112)
GFR calc Af Amer: 82 mL/min — ABNORMAL LOW (ref >90)
GFR calc non Af Amer: 70 mL/min — ABNORMAL LOW (ref >90)
Glucose, Bld: 121 mg/dL — ABNORMAL HIGH (ref 70–99)
POTASSIUM: 4.3 mmol/L (ref 3.5–5.1)
Sodium: 137 mmol/L (ref 135–145)

## 2014-11-16 LAB — CREATININE, SERUM
CREATININE: 1.15 mg/dL (ref 0.50–1.35)
GFR, EST AFRICAN AMERICAN: 82 mL/min — AB (ref >90)
GFR, EST NON AFRICAN AMERICAN: 70 mL/min — AB (ref >90)

## 2014-11-16 LAB — POCT ACTIVATED CLOTTING TIME: ACTIVATED CLOTTING TIME: 110 s

## 2014-11-16 LAB — MAGNESIUM
MAGNESIUM: 2 mg/dL (ref 1.5–2.5)
Magnesium: 2.3 mg/dL (ref 1.5–2.5)

## 2014-11-16 LAB — TROPONIN I: Troponin I: 76.5 ng/mL (ref <0.031)

## 2014-11-16 MED ORDER — ALBUMIN HUMAN 5 % IV SOLN
12.5000 g | Freq: Once | INTRAVENOUS | Status: AC
  Administered 2014-11-16: 12.5 g via INTRAVENOUS

## 2014-11-16 MED ORDER — POTASSIUM CHLORIDE 10 MEQ/50ML IV SOLN
10.0000 meq | INTRAVENOUS | Status: AC
  Administered 2014-11-16 (×3): 10 meq via INTRAVENOUS

## 2014-11-16 MED ORDER — SUCCINYLCHOLINE CHLORIDE 20 MG/ML IJ SOLN
INTRAMUSCULAR | Status: DC | PRN
  Administered 2014-11-16: 90 mg via INTRAVENOUS

## 2014-11-16 MED ORDER — ENOXAPARIN SODIUM 40 MG/0.4ML ~~LOC~~ SOLN
40.0000 mg | Freq: Every day | SUBCUTANEOUS | Status: DC
  Administered 2014-11-17 – 2014-11-20 (×4): 40 mg via SUBCUTANEOUS
  Filled 2014-11-16 (×5): qty 0.4

## 2014-11-16 MED ORDER — INSULIN DETEMIR 100 UNIT/ML ~~LOC~~ SOLN
25.0000 [IU] | Freq: Once | SUBCUTANEOUS | Status: AC
  Administered 2014-11-16: 25 [IU] via SUBCUTANEOUS
  Filled 2014-11-16: qty 0.25

## 2014-11-16 MED ORDER — INSULIN ASPART 100 UNIT/ML ~~LOC~~ SOLN
0.0000 [IU] | SUBCUTANEOUS | Status: DC
  Administered 2014-11-16 – 2014-11-17 (×3): 2 [IU] via SUBCUTANEOUS

## 2014-11-16 MED ORDER — CHLORHEXIDINE GLUCONATE 0.12 % MT SOLN
15.0000 mL | Freq: Two times a day (BID) | OROMUCOSAL | Status: DC
  Administered 2014-11-16: 15 mL via OROMUCOSAL
  Filled 2014-11-16: qty 15

## 2014-11-16 MED ORDER — ALBUMIN HUMAN 5 % IV SOLN
INTRAVENOUS | Status: AC
  Filled 2014-11-16: qty 250

## 2014-11-16 MED ORDER — CETYLPYRIDINIUM CHLORIDE 0.05 % MT LIQD
7.0000 mL | Freq: Four times a day (QID) | OROMUCOSAL | Status: DC
  Administered 2014-11-16 – 2014-11-18 (×5): 7 mL via OROMUCOSAL

## 2014-11-16 MED ORDER — SODIUM BICARBONATE 8.4 % IV SOLN
50.0000 meq | Freq: Once | INTRAVENOUS | Status: AC
  Administered 2014-11-16: 50 meq via INTRAVENOUS

## 2014-11-16 MED ORDER — POTASSIUM CHLORIDE 10 MEQ/50ML IV SOLN
10.0000 meq | Freq: Once | INTRAVENOUS | Status: AC
  Administered 2014-11-16: 10 meq via INTRAVENOUS

## 2014-11-16 MED ORDER — INSULIN ASPART 100 UNIT/ML ~~LOC~~ SOLN: 3.0000 [IU] | Freq: Three times a day (TID) | SUBCUTANEOUS | Status: DC

## 2014-11-16 MED ORDER — FUROSEMIDE 10 MG/ML IJ SOLN
40.0000 mg | Freq: Once | INTRAMUSCULAR | Status: AC
  Administered 2014-11-16: 40 mg via INTRAVENOUS

## 2014-11-16 MED ORDER — INSULIN DETEMIR 100 UNIT/ML ~~LOC~~ SOLN
25.0000 [IU] | Freq: Every day | SUBCUTANEOUS | Status: DC
  Filled 2014-11-16: qty 0.25

## 2014-11-16 MED ORDER — ETOMIDATE 2 MG/ML IV SOLN
INTRAVENOUS | Status: DC | PRN
  Administered 2014-11-16: 10 mg via INTRAVENOUS

## 2014-11-16 MED ORDER — ALBUMIN HUMAN 5 % IV SOLN
12.5000 g | Freq: Once | INTRAVENOUS | Status: AC
  Administered 2014-11-16: 12.5 g via INTRAVENOUS
  Filled 2014-11-16: qty 250

## 2014-11-16 MED FILL — Sodium Chloride IV Soln 0.9%: INTRAVENOUS | Qty: 3000 | Status: AC

## 2014-11-16 MED FILL — Sodium Bicarbonate IV Soln 8.4%: INTRAVENOUS | Qty: 50 | Status: AC

## 2014-11-16 MED FILL — Mannitol IV Soln 20%: INTRAVENOUS | Qty: 500 | Status: AC

## 2014-11-16 MED FILL — Heparin Sodium (Porcine) Inj 1000 Unit/ML: INTRAMUSCULAR | Qty: 30 | Status: AC

## 2014-11-16 MED FILL — Heparin Sodium (Porcine) Inj 1000 Unit/ML: INTRAMUSCULAR | Qty: 40 | Status: AC

## 2014-11-16 MED FILL — Magnesium Sulfate Inj 50%: INTRAMUSCULAR | Qty: 10 | Status: AC

## 2014-11-16 MED FILL — Potassium Chloride Inj 2 mEq/ML: INTRAVENOUS | Qty: 40 | Status: AC

## 2014-11-16 MED FILL — Electrolyte-R (PH 7.4) Solution: INTRAVENOUS | Qty: 3000 | Status: AC

## 2014-11-16 MED FILL — Lidocaine HCl IV Inj 20 MG/ML: INTRAVENOUS | Qty: 5 | Status: AC

## 2014-11-16 NOTE — Progress Notes (Signed)
CRITICAL VALUE ALERT  Critical value received:  Troponin 76.50  Date of notification:  11/16/14  Time of notification:  0620  Critical value read back: yes  Nurse who received alert:  D. Lofters RN   Expected value post CABG. Will pass on result to day RN. Thresa Ross RN

## 2014-11-16 NOTE — Op Note (Signed)
NAMEMORIO, Riddle            ACCOUNT NO.:  [id number]  MEDICAL RECORD NO.:  [id number]  LOCATION:  2S04C                        FACILITY:  [facility]  PHYSICIAN:  Salvatore Decent. Dorris Fetch, M.D.DATE OF BIRTH:  Apr 12, 1961  DATE OF PROCEDURE:  11/15/2014 DATE OF DISCHARGE:                              OPERATIVE REPORT   PREOPERATIVE DIAGNOSIS:  Severe two-vessel coronary artery disease with acute anterior ST-elevation myocardial infarction.  POSTOPERATIVE DIAGNOSIS:  Severe two-vessel coronary artery disease with acute anterior ST-elevation myocardial infarction.  PROCEDURE:   Emergency median sternotomy Coronary artery bypass grafting x 3  Left internal mammary artery to left anterior descending  Saphenous vein graft to first diagonal  Saphenous vein graft to first obtuse marginal Endoscopic vein harvest right thigh.  SURGEON:  Salvatore Decent. Dorris Fetch, M.D.  ASSISTANT:  Lowella Dandy, PA-C.  ANESTHESIA:  General.  FINDINGS:  Transesophageal echocardiography revealed an ejection fraction of approximately 40% with severe anterior hypokinesis.  There was no significant valvular pathology.  Good quality conduits, although vein was relatively large in size.  Good quality target vessels.  Postbypass transesophageal echocardiography unchanged from prebypass study.  CLINICAL NOTE:  [name] is a 54 year old gentleman with a history of dyslipidemia and a strong family history for coronary artery disease. He presented on November 13, 2014, with a non-ST elevation MI.  He underwent cardiac catheterization on November 14, 2014, which revealed severe 2-vessel disease involving the LAD and circumflex, and he was advised to undergo coronary artery bypass grafting.  On the evening of November 14, 2014, he developed chest pain and ST elevations.  He was managed with increasing doses of morphine and nitroglycerin.  On the morning of November 15, 2014, he was seen by Dr. Peter [place] who diagnosed an ST  elevation MI, and he was sent emergently to the catheterization laboratory for a balloon pump. I saw him in the cath lab and recommended emergency coronary bypass grafting.  Dr. Nicki Guadalajara performed cardiac catheterization and it revealed total occlusion of the LAD.  He was able to reestablish flow and wires were left in the LAD and diagonal.  The patient was transported emergently to the operating room.  DESCRIPTION OF PROCEDURE:  [name] was brought directly to the operating room from the cardiac catheterization laboratory.  The Anesthesia Service placed an arterial blood pressure monitoring line and a Swan-Ganz catheter.  His initial cardiac index was greater than 2 L/minute/m2.  He was anesthetized and intubated.  A Foley catheter was already in place.  The chest, abdomen, and legs were prepped and draped in usual sterile fashion.  Intravenous antibiotics were administered.  A median sternotomy was performed.  Simultaneously, incision was made in the medial aspect of the right leg at the level of the knee.  The greater saphenous vein was harvested from the right thigh endoscopically.  An incision had to be made in mid thigh because of a bifurcation of the vessel.  Simultaneously with the vein harvest, the left internal mammary artery was harvested, it was a good quality vessel.  The patient was already on a heparin infusion on arrival to the operating room and that was continued during the vessel harvest.  After harvesting the conduits,  the remainder of the full heparin dose was given.  Sternal retractor was placed.  The pericardium was opened.  The ascending aorta was inspected.  After confirming adequate anticoagulation with ACT measurement, the aorta was cannulated via concentric 2-0 Ethibond pledgeted pursestring sutures.  A dual-stage venous cannula was placed via pursestring suture in the right atrial appendage.  Cardiopulmonary bypass was begun.  Flows were maintained  per protocol.  He was cooled to 32 degrees Celsius.  The coronary arteries were inspected and anastomotic sites were chosen.  The catheter and wires were removed from the sheath in the right groin.  The conduits were inspected and prepared.  A retrograde cardioplegic cannula was placed via a pursestring suture in the right atrium and directed into the coronary sinus.  An antegrade cardioplegic cannula was placed in the ascending aorta.  The aorta was crossclamped.  The left ventricle was emptied via the aortic root vent.  Cardiac arrest then was achieved with a combination of cold antegrade blood cardioplegia and retrograde blood cardioplegia and topical iced saline.  An initial 500 mL of cardioplegia was given antegrade.  There was a rapid diastolic arrest, but very sluggish septal cooling.  An additional liter of cardioplegia was administered retrograde and there was septal cooling to 9 degrees Celsius.  A reversed saphenous vein graft then was placed end-to-side to the first obtuse marginal branch of the left circumflex.  This was the dominant lateral branch.  It was a 2 mm good quality target.  The vein was of large caliber and was a good quality.  It was anastomosed end-to-side with a running 7-0 Prolene suture.  All anastomoses were probed proximally and distally at their completion to ensure patency. Cardioplegia was administered down each vein graft at its completion to assess flow and hemostasis.  Next, a reversed saphenous vein graft was placed end-to-side to the first diagonal branch.  This was a high anterolateral branch that bifurcated at the level of the anastomosis.  A 1.5 mm probe passed easily into each limb of the bifurcated vessel.  The vein was again relatively large caliber and somewhat size mismatched, but it was a good quality target vessel.  The vein was free of any varicosities or sclerotic areas. An end-to- side anastomosis was performed with a running 7-0  Prolene suture. Again, there was good flow and good hemostasis.  Additional cardioplegia was administered via the retrograde cannula. The left internal mammary artery was brought through a window in the pericardium.  The distal end was beveled.  It was a 2 mm good quality conduit.  It was anastomosed end-to-side to the distal LAD, which was a 2 mm good quality target.  The end-to-side anastomosis was performed with a running 8-0 Prolene suture.  At the completion of the mammary to LAD anastomosis, the bulldog clamp was briefly removed to inspect for hemostasis and septal rewarming was noted.  The bulldog clamp was replaced.  The mammary pedicle was tacked to the epicardial surface of the heart with 6-0 Prolene sutures.  Rewarming was begun.  The vein grafts were cut to length.  The cardioplegic cannula was removed in the ascending aorta.  The proximal vein graft anastomoses were performed to 4.5 mm punch aortotomies with running 6-0 Prolene sutures.  At the completion of final proximal anastomosis, the patient was placed in Trendelenburg position. Lidocaine was administered.  A warm dose of retrograde cardioplegia was administered.  The bulldog clamp was again removed from the left mammary artery.  De-airing  was performed and the aortic crossclamp was removed. Total crossclamp time was 58 minutes.  The patient did not require defibrillation and resumed sinus rhythm spontaneously.  While rewarming was completed, all proximal and distal anastomoses were inspected for hemostasis.  Epicardial pacing wires were placed on the right ventricle and right atrium.  When the patient had rewarmed to a core temperature of 37 degrees Celsius, he was weaned from cardiopulmonary bypass on the first attempt.  The intraaortic balloon pump was placed back on 1:1 assist at the time of separation from bypass.  No inotropic support was necessary.  The initial cardiac index was 3 liters/minute/m2, and the  patient remained hemodynamically stable throughout the post bypass period.  A test dose protamine was administered and was well tolerated.  The atrial and aortic cannulae were removed.  The remainder of the protamine was administered without incident.  The chest was irrigated with warm saline.  Hemostasis was achieved.  Left pleural and mediastinal chest tubes were placed through separate subcostal incisions.  The sternum was closed with a combination of single and double heavy gauge stainless steel wires.  The pectoralis fascia, subcutaneous tissue, and skin were closed in standard fashion.  All sponge, needle, and instrument counts were correct at the end of the procedure.  The patient was taken from the operating room to the surgical intensive care unit in good condition.     Salvatore Decent Dorris Fetch, M.D.     SCH/MEDQ  D:  11/15/2014  T:  11/16/2014  Job:  [id number]

## 2014-11-16 NOTE — Progress Notes (Addendum)
Patient had coughing fit and dislodged ETT. Large air leak heard. RT to beside.  ABG obtained. Sub-optimal for extubation as patient is on 60% FiO2 currently. Dr. Cornelius Moras notified. CRNA called to exchange ETT. Will continue to closely monitor. Thresa Ross RN

## 2014-11-16 NOTE — Progress Notes (Signed)
TELEMETRY: Reviewed telemetry pt in NSR with rate 90: Filed Vitals:   11/16/14 0930 11/16/14 0945 11/16/14 1000 11/16/14 1015  BP:   97/56   Pulse: 92 93 94 94  Temp: 99.7 F (37.6 C) 99.7 F (37.6 C) 99.5 F (37.5 C) 99.7 F (37.6 C)  TempSrc:   Core (Comment)   Resp: 19 18 23 17   Height:      Weight:      SpO2: 94% 93% 94% 94%    Intake/Output Summary (Last 24 hours) at 11/16/14 1044 Last data filed at 11/16/14 1000  Gross per 24 hour  Intake 6611.03 ml  Output   5220 ml  Net 1391.03 ml   Filed Weights   11/13/14 1700 11/14/14 0400 11/15/14 0400  Weight: 274 lb 8 oz (124.512 kg) 274 lb 11.2 oz (124.603 kg) 269 lb 2.9 oz (122.1 kg)    Subjective Patient extubated about one hour ago. Complains of sore throat and some difficulty taking a deep breath. No significant pain.   Marland Kitchen acetaminophen  1,000 mg Oral 4 times per day   Or  . acetaminophen (TYLENOL) oral liquid 160 mg/5 mL  1,000 mg Per Tube 4 times per day  . antiseptic oral rinse  7 mL Mouth Rinse QID  . aspirin EC  325 mg Oral Daily   Or  . aspirin  324 mg Per Tube Daily  . atorvastatin  80 mg Oral q1800  . bisacodyl  10 mg Oral Daily   Or  . bisacodyl  10 mg Rectal Daily  . cefUROXime (ZINACEF)  IV  1.5 g Intravenous Q12H  . chlorhexidine  15 mL Mouth Rinse BID  . Chlorhexidine Gluconate Cloth  6 each Topical Daily  . docusate sodium  200 mg Oral Daily  . insulin regular  0-10 Units Intravenous TID WC  . levalbuterol  0.63 mg Nebulization Q6H  . metoCLOPramide (REGLAN) injection  10 mg Intravenous 4 times per day  . metoprolol tartrate  12.5 mg Oral BID   Or  . metoprolol tartrate  12.5 mg Per Tube BID  . mupirocin ointment  1 application Nasal BID  . [START ON 11/17/2014] pantoprazole  40 mg Oral Daily  . sodium chloride  3 mL Intravenous Q12H   . sodium chloride 20 mL/hr at 11/16/14 1000  . sodium chloride    . sodium chloride 20 mL (11/15/14 2306)  . dexmedetomidine Stopped (11/16/14 0615)  .  DOPamine 3 mcg/kg/min (11/16/14 0700)  . insulin (NOVOLIN-R) infusion 4.5 Units/hr (11/16/14 1000)  . lactated ringers 20 mL (11/15/14 1950)  . lactated ringers 20 mL/hr at 11/16/14 1000  . nitroGLYCERIN    . phenylephrine (NEO-SYNEPHRINE) Adult infusion 30 mcg/min (11/16/14 1000)    LABS: Basic Metabolic Panel:  Recent Labs  99/24/26 0354  11/15/14 2100 11/15/14 2101 11/16/14 0330  NA 136  < >  --  139 137  K 3.9  < >  --  4.2 4.3  CL 105  < >  --  105 110  CO2 27  --   --   --  23  GLUCOSE 148*  < >  --  139* 121*  BUN 11  < >  --  14 14  CREATININE 1.04  < > 1.27 1.20 1.15  CALCIUM 9.0  --   --   --  8.5  MG  --   --  2.7*  --  2.3  < > = values in this interval not displayed.  Liver Function Tests:  Recent Labs  11/14/14 0350  AST 55*  ALT 74*  ALKPHOS 78  BILITOT 1.0  PROT 6.7  ALBUMIN 3.4*   No results for input(s): LIPASE, AMYLASE in the last 72 hours. CBC:  Recent Labs  11/15/14 2100 11/15/14 2101 11/16/14 0330  WBC 12.2*  --  13.8*  HGB 13.4 13.3 13.8  HCT 39.3 39.0 40.5  MCV 89.5  --  89.2  PLT 123*  --  104*   Cardiac Enzymes:  Recent Labs  11/13/14 1500 11/13/14 2028 11/16/14 0330  TROPONINI 1.92* 2.35* 76.50*   BNP: No results for input(s): PROBNP in the last 72 hours. D-Dimer:  Recent Labs  11/13/14 1045  DDIMER 0.27   Hemoglobin A1C:  Recent Labs  11/13/14 1718  HGBA1C 6.6*   Fasting Lipid Panel:  Recent Labs  11/14/14 0350  CHOL 174  HDL 24*  LDLCALC 116*  TRIG 168*  CHOLHDL 7.3   Thyroid Function Tests: No results for input(s): TSH, T4TOTAL, T3FREE, THYROIDAB in the last 72 hours.  Invalid input(s): FREET3   Radiology/Studies:  Dg Chest Port 1 View  11/16/2014   CLINICAL DATA:  Status post coronary bypass grafting  EXAM: PORTABLE CHEST - 1 VIEW  COMPARISON:  11/16/2014 0134 hours  FINDINGS: Cardiac shadow is stable. Postsurgical changes are again seen. Thoracostomy and midline drains are seen. An  endotracheal tube is again noted 3.9 cm above the carina. A nasogastric catheter is seen within the stomach. Swan-Ganz catheter is noted in the pulmonary outflow tract. An aortic balloon pump is again noted and stable. Some minimal atelectasis is noted in the left lung base. The overall degree of aeration has improved.  IMPRESSION: Postoperative change with tubes and lines as described stable in appearance.  Left basilar atelectasis.   Electronically Signed   By: Alcide Clever M.D.   On: 11/16/2014 07:46   Dg Chest Port 1 View  11/16/2014   CLINICAL DATA:  Endotracheally intubated.  EXAM: PORTABLE CHEST - 1 VIEW  COMPARISON:  Yesterday at 1501 hr  FINDINGS: Endotracheal tube at the thoracic inlet. Enteric tube in place, tip below the diaphragm. Tip of the right Swan-Ganz catheter in the main pulmonary outflow tract. The tip of the intra-aortic by alone pump is in the descending thoracic aorta at the level of T5-C6. Left-sided chest tube and mediastinal drain. Lung volumes remain low. There is no pneumothorax. Minimal increased atelectasis at the left lung base. Heart size is unchanged allowing for differences in technique.  IMPRESSION: 1. Unchanged position of endotracheal tube, enteric tube, Swan-Ganz catheter, intra-aortic balloon pump, left chest tube and mediastinal drains. 2. Increasing atelectasis at the left lung base. Lower lung volumes from prior.   Electronically Signed   By: Rubye Oaks M.D.   On: 11/16/2014 01:50   Dg Chest Port 1 View  11/15/2014   CLINICAL DATA:  Coronary artery disease. Status post coronary artery bypass grafting. Hypoxia.  EXAM: PORTABLE CHEST - 1 VIEW  COMPARISON:  November 13, 2014  FINDINGS: Patient is status post coronary artery bypass grafting. Endotracheal tube tip is 5.3 cm above the carina. Swan-Ganz catheter tip is in the proximal right main pulmonary artery. There is an intra-aortic balloon pump with the tip in the descending thoracic aorta at the level of T6. There  is a chest tube on the left as well as a mediastinal drain. Nasogastric tube tip and side port are in the stomach. No pneumothorax. There is patchy atelectasis in  the left base. Lungs otherwise clear. Heart is borderline prominent with pulmonary vascularity within normal limits.  IMPRESSION: Tube and catheter positions as described without pneumothorax. Patchy atelectasis left base. Lungs elsewhere clear. Heart borderline prominent.   Electronically Signed   By: Bretta Bang III M.D.   On: 11/15/2014 15:33   Ecg: NSR with anterolateral STEMI- persistent ST elevation anteriorly with Q waves V1-2.  PHYSICAL EXAM General: Well developed, well nourished,  Head: Normocephalic, atraumatic, sclera non-icteric, oropharynx is clear Neck: Negative for carotid bruits. JVD not elevated. No adenopathy Lungs: decreased BS anterior. Shallow breaths. Breathing is unlabored. Heart: RRR S1 S2 without murmurs, rubs, or gallops.  Chest: MTs in place. Abdomen: Soft, non-tender, non-distended with normoactive bowel sounds. No hepatomegaly. No rebound/guarding. No obvious abdominal masses. Msk:  Strength and tone appears normal for age. Extremities: No clubbing, cyanosis or edema.  IABP in left groin without hematoma. Feet cool. Neuro: Alert and oriented X 3. Moves all extremities spontaneously. Psych:  Responds to questions appropriately with a normal affect.  ASSESSMENT AND PLAN: 1. CAD. Initial admission with NSTEMI. 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 Developed recurrent chest pain with anterior STEMI on 11/14/14. Underwent emergent PCI and IABP followed by emergent CABG yesterday am. Troponin increased to 76. EF 40% by TEE (preop Echo showed EF 45%. Now on phenylephrine and IABP. Dopamine is off. He is extubated and lungs clear on CXR. Ecg shows persistent ST elevation. Will wean pressors and IABP today as tolerated. Once BP has recovered sufficiently will need to start ACEi and beta blocker. Continue ASA  and statin. Would consider Plavix prior to DC. Will need Echo in 3 months. 2. Hyperlipidemia on statin.  3. Family history of CAD  I had a frank and open discussion with the family (wife, brother, and daughter) today about the care he received the night before last. Questions answered and will continue to offer support.   Present on Admission:  . NSTEMI (non-ST elevated myocardial infarction) . Dyslipidemia (high LDL; low HDL)  Signed, Sidda Humm [place], MDFACC 11/16/2014 10:44 AM

## 2014-11-16 NOTE — Progress Notes (Signed)
Site area: RFA Site Prior to Removal:  Level 0 Pressure Applied For:35min Manual:   yes Patient Status During Pull:  stable Post Pull Site:  Level Post Pull Instructions Given:  yes Post Pull Pulses Present: RT PT palpable Dressing Applied:  clear Bedrest begins @ NA Comments:

## 2014-11-16 NOTE — Progress Notes (Signed)
CRNA to bedside. Advanced ETT to see if leak subsided. No change to leak sound or volumes pulled on ventilator. ETT exchanged by CRNA and anesthesiologist at bedside. Patient extremely agitated pre and post ETT exchange. PRN medications administered and Precedex drip increased to achieve appropriate RASS. Will continue to monitor closely. Thresa Ross RN

## 2014-11-16 NOTE — Progress Notes (Signed)
EKG CRITICAL VALUE     12 lead EKG performed.  Critical value noted.  Thresa Ross, RN notified.   Ihor Gully, CCT 11/16/2014 7:05 AM

## 2014-11-16 NOTE — Progress Notes (Signed)
      [address]       [place] [address]             [phone]      Some incisional pain but overall feels well  BP 97/65 mmHg  Pulse 105  Temp(Src) 97.7 F (36.5 C) (Core (Comment))  Resp 16  Ht 5\' 11"  (1.803 m)  Wt 269 lb 2.9 oz (122.1 kg)  BMI 37.56 kg/m2  SpO2 92%   Intake/Output Summary (Last 24 hours) at 11/16/14 1742 Last data filed at 11/16/14 1700  Gross per 24 hour  Intake   3655 ml  Output   3220 ml  Net    435 ml    K= 3.7- will supplement  Wean dopamine  Dc Swan and chest tubes  Viviann Spare C. Dorris Fetch, MD [facility] [phone]

## 2014-11-16 NOTE — Progress Notes (Signed)
Site area:Left IABP catheter and sheath  Site Prior to Removal:  Level 0 Pressure Applied For:68min Manual:   yes Patient Status During Pull:  stable Post Pull Site:  Level 0 Post Pull Instructions Given: yes  Post Pull Pulses Present: Left PT palpable Dressing Applied:  clear Bedrest begins @ NA Comments:

## 2014-11-16 NOTE — Procedures (Signed)
Extubation Procedure Note  Patient Details:   Name: Seth Riddle DOB: July 02, 1961 MRN: [id number]   Airway Documentation:     Evaluation  O2 sats: stable throughout Complications: No apparent complications Patient did tolerate procedure well. Bilateral Breath Sounds: Clear Suctioning: Airway Yes  Patient tolerated rapid wean. NIF -40 and VC .7 L. Positive for cuff leak. Patient extubated to a 55% VM. No signs of dyspnea or stridor. Patient instructed on the Incentive Spirometer, achieving 1000 mL, three times. RN at bedside.   Ancil Boozer 11/16/2014, 10:22 AM

## 2014-11-16 NOTE — Progress Notes (Signed)
1 Day Post-Op Procedure(s) (LRB): CORONARY ARTERY BYPASS GRAFTING (CABG), ON PUMP, TIMES THREE, USING LEFT INTERNAL MAMMARY ARTERY, RIGHT GREATER SAPHENOUS VEIN HARVESTED ENDOSCOPICALLY (N/A) TRANSESOPHAGEAL ECHOCARDIOGRAM (TEE) (N/A) Subjective: Intubated, some pain  Objective: Vital signs in last 24 hours: Temp:  [98.8 F (37.1 C)-102 F (38.9 C)] 100.8 F (38.2 C) (03/30 0732) Pulse Rate:  [76-101] 91 (03/30 0732) Cardiac Rhythm:  [-] Atrial paced (03/30 0700) Resp:  [12-32] 16 (03/30 0732) BP: (85-108)/(43-62) 101/50 mmHg (03/30 0732) SpO2:  [88 %-100 %] 94 % (03/30 0756) Arterial Line BP: (69-320)/(46-310) 102/51 mmHg (03/30 0715) FiO2 (%):  [40 %-100 %] 40 % (03/30 0756)  Hemodynamic parameters for last 24 hours: PAP: (21-38)/(9-23) 33/20 mmHg CO:  [4 L/min-6.6 L/min] 4.8 L/min CI:  [1.7 L/min/m2-2.8 L/min/m2] 2 L/min/m2  Intake/Output from previous day: 03/29 0701 - 03/30 0700 In: 6080.1 [I.V.:2642.1; Blood:1358; NG/GT:30; IV Piggyback:2050] Out: 3880 [Urine:1970; Blood:1570; Chest Tube:340] Intake/Output this shift:    General appearance: cooperative Neurologic: intact Heart: regular rate and rhythm Lungs: clear to auscultation bilaterally Abdomen: normal findings: soft, non-tender Extremities: well perfused  Lab Results:  Recent Labs  11/15/14 2100 11/15/14 2101 11/16/14 0330  WBC 12.2*  --  13.8*  HGB 13.4 13.3 13.8  HCT 39.3 39.0 40.5  PLT 123*  --  104*   BMET:  Recent Labs  11/15/14 0354  11/15/14 2101 11/16/14 0330  NA 136  < > 139 137  K 3.9  < > 4.2 4.3  CL 105  < > 105 110  CO2 27  --   --  23  GLUCOSE 148*  < > 139* 121*  BUN 11  < > 14 14  CREATININE 1.04  < > 1.20 1.15  CALCIUM 9.0  --   --  8.5  < > = values in this interval not displayed.  PT/INR:  Recent Labs  11/15/14 1505  LABPROT 15.9*  INR 1.26   ABG    Component Value Date/Time   PHART 7.375 11/16/2014 0644   HCO3 22.1 11/16/2014 0644   TCO2 23 11/16/2014 0644    ACIDBASEDEF 2.0 11/16/2014 0644   O2SAT 93.0 11/16/2014 0644   CBG (last 3)   Recent Labs  11/16/14 0500 11/16/14 0606 11/16/14 0711  GLUCAP 112* 104* 106*    Assessment/Plan: S/P Procedure(s) (LRB): CORONARY ARTERY BYPASS GRAFTING (CABG), ON PUMP, TIMES THREE, USING LEFT INTERNAL MAMMARY ARTERY, RIGHT GREATER SAPHENOUS VEIN HARVESTED ENDOSCOPICALLY (N/A) TRANSESOPHAGEAL ECHOCARDIOGRAM (TEE) (N/A) -  CV- s/p emergency CABG for evolving anterior STEMI  Cardiac index OK with dopamine and IABP- will attempt to wean and dc IABP   Continue dopamine  Had significant MI with troponin of 76  ECG shows some persistent ST changes- may be a component of pericarditis as well  RESP_ ETT changed last PM  Lungs clear on exam, chest x ray OK, wean to extubate  RENAL- lytes, creatinine OK, diurese  ENDO_ CBG well controlled with insulin drip  Thrombocytopenia- mild, follow, SCD for DVT prophylaxis   LOS: 3 days    Seth Riddle 11/16/2014

## 2014-11-17 ENCOUNTER — Inpatient Hospital Stay (HOSPITAL_COMMUNITY): Payer: BC Managed Care – PPO

## 2014-11-17 LAB — GLUCOSE, CAPILLARY
GLUCOSE-CAPILLARY: 101 mg/dL — AB (ref 70–99)
GLUCOSE-CAPILLARY: 127 mg/dL — AB (ref 70–99)
GLUCOSE-CAPILLARY: 133 mg/dL — AB (ref 70–99)
GLUCOSE-CAPILLARY: 135 mg/dL — AB (ref 70–99)
Glucose-Capillary: 140 mg/dL — ABNORMAL HIGH (ref 70–99)
Glucose-Capillary: 144 mg/dL — ABNORMAL HIGH (ref 70–99)
Glucose-Capillary: 137 mg/dL — ABNORMAL HIGH (ref 70–99)
Glucose-Capillary: 135 mg/dL — ABNORMAL HIGH (ref 70–99)
Glucose-Capillary: 146 mg/dL — ABNORMAL HIGH (ref 70–99)
Glucose-Capillary: 110 mg/dL — ABNORMAL HIGH (ref 70–99)
Glucose-Capillary: 97 mg/dL (ref 70–99)

## 2014-11-17 LAB — CBC
HCT: 37.7 % — ABNORMAL LOW (ref 39.0–52.0)
Hemoglobin: 12.7 g/dL — ABNORMAL LOW (ref 13.0–17.0)
MCH: 30.2 pg (ref 26.0–34.0)
MCHC: 33.7 g/dL (ref 30.0–36.0)
MCV: 89.8 fL (ref 78.0–100.0)
Platelets: 80 10*3/uL — ABNORMAL LOW (ref 150–400)
RBC: 4.2 MIL/uL — AB (ref 4.22–5.81)
RDW: 13.2 % (ref 11.5–15.5)
WBC: 10.2 10*3/uL (ref 4.0–10.5)

## 2014-11-17 LAB — BASIC METABOLIC PANEL
ANION GAP: 8 (ref 5–15)
BUN: 21 mg/dL (ref 6–23)
CALCIUM: 8.6 mg/dL (ref 8.4–10.5)
CO2: 24 mmol/L (ref 19–32)
Chloride: 106 mmol/L (ref 96–112)
Creatinine, Ser: 0.98 mg/dL (ref 0.50–1.35)
GFR calc non Af Amer: >90 mL/min (ref >90)
Glucose, Bld: 142 mg/dL — ABNORMAL HIGH (ref 70–99)
Potassium: 4.1 mmol/L (ref 3.5–5.1)
Sodium: 138 mmol/L (ref 135–145)

## 2014-11-17 MED ORDER — INSULIN DETEMIR 100 UNIT/ML ~~LOC~~ SOLN
15.0000 [IU] | Freq: Every day | SUBCUTANEOUS | Status: DC
  Administered 2014-11-17: 15 [IU] via SUBCUTANEOUS
  Filled 2014-11-17 (×2): qty 0.15

## 2014-11-17 MED ORDER — INSULIN ASPART 100 UNIT/ML ~~LOC~~ SOLN
0.0000 [IU] | Freq: Three times a day (TID) | SUBCUTANEOUS | Status: DC
  Administered 2014-11-17 – 2014-11-21 (×7): 2 [IU] via SUBCUTANEOUS

## 2014-11-17 MED ORDER — POTASSIUM CHLORIDE 10 MEQ/50ML IV SOLN
10.0000 meq | INTRAVENOUS | Status: AC
  Administered 2014-11-17 (×2): 10 meq via INTRAVENOUS
  Filled 2014-11-17: qty 50

## 2014-11-17 MED ORDER — FUROSEMIDE 10 MG/ML IJ SOLN
40.0000 mg | Freq: Once | INTRAMUSCULAR | Status: AC
  Administered 2014-11-17: 40 mg via INTRAVENOUS
  Filled 2014-11-17: qty 4

## 2014-11-17 MED ORDER — PROMETHAZINE HCL 25 MG/ML IJ SOLN: 12.5000 mg | Freq: Four times a day (QID) | INTRAMUSCULAR | Status: DC | PRN

## 2014-11-17 NOTE — Progress Notes (Signed)
TELEMETRY: Reviewed telemetry pt in NSR with rare PVC  Filed Vitals:   11/17/14 0530 11/17/14 0600 11/17/14 0630 11/17/14 0700  BP:  118/81 128/79 114/78  Pulse: 105 111 109 110  Temp:      TempSrc:      Resp: Height:      Weight:      SpO2: 97% 94% 98% 96%    Intake/Output Summary (Last 24 hours) at 11/17/14 0725 Last data filed at 11/17/14 0700  Gross per 24 hour  Intake 1577.65 ml  Output   3140 ml  Net -1562.35 ml   Filed Weights   11/14/14 0400 11/15/14 0400 11/17/14 0500  Weight: 274 lb 11.2 oz (124.603 kg) 269 lb 2.9 oz (122.1 kg) 273 lb 5.9 oz (124 kg)    Subjective Patient sitting up in chair. Feels pretty well. Still hurts to take deep breath. IABP out. Chest tube out. Off pressors. Good  Urine output.  Marland Kitchen acetaminophen  1,000 mg Oral 4 times per day   Or  . acetaminophen (TYLENOL) oral liquid 160 mg/5 mL  1,000 mg Per Tube 4 times per day  . antiseptic oral rinse  7 mL Mouth Rinse QID  . aspirin EC  325 mg Oral Daily   Or  . aspirin  324 mg Per Tube Daily  . atorvastatin  80 mg Oral q1800  . bisacodyl  10 mg Oral Daily   Or  . bisacodyl  10 mg Rectal Daily  . cefUROXime (ZINACEF)  IV  1.5 g Intravenous Q12H  . Chlorhexidine Gluconate Cloth  6 each Topical Daily  . docusate sodium  200 mg Oral Daily  . enoxaparin (LOVENOX) injection  40 mg Subcutaneous QHS  . insulin aspart  0-24 Units Subcutaneous 6 times per day  . insulin aspart  3 Units Subcutaneous TID WC  . insulin detemir  25 Units Subcutaneous Daily  . insulin regular  0-10 Units Intravenous TID WC  . levalbuterol  0.63 mg Nebulization Q6H  . metoprolol tartrate  12.5 mg Oral BID   Or  . metoprolol tartrate  12.5 mg Per Tube BID  . mupirocin ointment  1 application Nasal BID  . pantoprazole  40 mg Oral Daily  . sodium chloride  3 mL Intravenous Q12H   . sodium chloride 10 mL/hr at 11/16/14 1400  . sodium chloride    . sodium chloride 20 mL (11/15/14 2306)  . dexmedetomidine  Stopped (11/16/14 0615)  . DOPamine Stopped (11/17/14 0300)  . insulin (NOVOLIN-R) infusion Stopped (11/16/14 2100)  . lactated ringers 20 mL (11/15/14 1950)  . lactated ringers 20 mL/hr at 11/16/14 1300  . nitroGLYCERIN    . phenylephrine (NEO-SYNEPHRINE) Adult infusion Stopped (11/16/14 1700)    LABS: Basic Metabolic Panel:  Recent Labs  21/30/86 0330 11/16/14 1700 11/16/14 1722 11/17/14 0400  NA 137  --  140 138  K 4.3  --  3.7 4.1  CL 110  --  102 106  CO2 23  --   --  24  GLUCOSE 121*  --  135* 142*  BUN 14  --  19 21  CREATININE 1.15 1.15 1.00 0.98  CALCIUM 8.5  --   --  8.6  MG 2.3 2.0  --   --    Liver Function Tests: No results for input(s): AST, ALT, ALKPHOS, BILITOT, PROT, ALBUMIN in the last 72 hours. No results for input(s): LIPASE, AMYLASE in the last 72 hours. CBC:  Recent Labs  11/16/14 1700 11/16/14 1722 11/17/14 0400  WBC 11.8*  --  10.2  HGB 12.8* 12.9* 12.7*  HCT 37.8* 38.0* 37.7*  MCV 89.4  --  89.8  PLT 83*  --  80*   Cardiac Enzymes:  Recent Labs  11/16/14 0330  TROPONINI 76.50*   BNP: No results for input(s): PROBNP in the last 72 hours. D-Dimer: No results for input(s): DDIMER in the last 72 hours. Hemoglobin A1C: No results for input(s): HGBA1C in the last 72 hours. Fasting Lipid Panel: No results for input(s): CHOL, HDL, LDLCALC, TRIG, CHOLHDL, LDLDIRECT in the last 72 hours. Thyroid Function Tests: No results for input(s): TSH, T4TOTAL, T3FREE, THYROIDAB in the last 72 hours.  Invalid input(s): FREET3   Radiology/Studies:  Dg Chest Port 1 View  11/16/2014   CLINICAL DATA:  Chest tube removal  EXAM: PORTABLE CHEST - 1 VIEW  COMPARISON:  Earlier film of the same date  FINDINGS: Patient has been extubated and the nasogastric tube removed. Left chest tube has been removed with no pneumothorax evident. Swan-Ganz has been retracted leaving the right IJ venous sheath in place. Previous CABG. Lower lung volumes with increasing  perihilar and infrahilar atelectasis or infiltrates, left greater than right . No definite effusion.  IMPRESSION: 1. Left chest tube removal, with no pneumothorax. 2. Extubation, with lower lung volumes, increasing perihilar and bibasilar atelectasis or infiltrates, left worse than right.   Electronically Signed   By: Corlis Leak M.D.   On: 11/16/2014 22:06   Dg Chest Port 1 View  11/16/2014   CLINICAL DATA:  Status post coronary bypass grafting  EXAM: PORTABLE CHEST - 1 VIEW  COMPARISON:  11/16/2014 0134 hours  FINDINGS: Cardiac shadow is stable. Postsurgical changes are again seen. Thoracostomy and midline drains are seen. An endotracheal tube is again noted 3.9 cm above the carina. A nasogastric catheter is seen within the stomach. Swan-Ganz catheter is noted in the pulmonary outflow tract. An aortic balloon pump is again noted and stable. Some minimal atelectasis is noted in the left lung base. The overall degree of aeration has improved.  IMPRESSION: Postoperative change with tubes and lines as described stable in appearance.  Left basilar atelectasis.   Electronically Signed   By: Alcide Clever M.D.   On: 11/16/2014 07:46   Dg Chest Port 1 View  11/16/2014   CLINICAL DATA:  Endotracheally intubated.  EXAM: PORTABLE CHEST - 1 VIEW  COMPARISON:  Yesterday at 1501 hr  FINDINGS: Endotracheal tube at the thoracic inlet. Enteric tube in place, tip below the diaphragm. Tip of the right Swan-Ganz catheter in the main pulmonary outflow tract. The tip of the intra-aortic by alone pump is in the descending thoracic aorta at the level of T5-C6. Left-sided chest tube and mediastinal drain. Lung volumes remain low. There is no pneumothorax. Minimal increased atelectasis at the left lung base. Heart size is unchanged allowing for differences in technique.  IMPRESSION: 1. Unchanged position of endotracheal tube, enteric tube, Swan-Ganz catheter, intra-aortic balloon pump, left chest tube and mediastinal drains. 2.  Increasing atelectasis at the left lung base. Lower lung volumes from prior.   Electronically Signed   By: Rubye Oaks M.D.   On: 11/16/2014 01:50   Dg Chest Port 1 View  11/15/2014   CLINICAL DATA:  Coronary artery disease. Status post coronary artery bypass grafting. Hypoxia.  EXAM: PORTABLE CHEST - 1 VIEW  COMPARISON:  November 13, 2014  FINDINGS: Patient is status post coronary artery  bypass grafting. Endotracheal tube tip is 5.3 cm above the carina. Swan-Ganz catheter tip is in the proximal right main pulmonary artery. There is an intra-aortic balloon pump with the tip in the descending thoracic aorta at the level of T6. There is a chest tube on the left as well as a mediastinal drain. Nasogastric tube tip and side port are in the stomach. No pneumothorax. There is patchy atelectasis in the left base. Lungs otherwise clear. Heart is borderline prominent with pulmonary vascularity within normal limits.  IMPRESSION: Tube and catheter positions as described without pneumothorax. Patchy atelectasis left base. Lungs elsewhere clear. Heart borderline prominent.   Electronically Signed   By: Bretta Bang III M.D.   On: 11/15/2014 15:33     PHYSICAL EXAM General: Well developed, well nourished, WM on venturi mask Head: Normocephalic, atraumatic, sclera non-icteric, oropharynx is clear Neck: Negative for carotid bruits. JVD not elevated. No adenopathy Lungs: decreased BS bases with rales. Shallow breaths. Breathing is unlabored. Heart: RRR S1 S2 without murmurs, rubs, or gallops.  Abdomen: Soft, non-tender, non-distended with normoactive bowel sounds. No hepatomegaly. No rebound/guarding. No obvious abdominal masses. Msk:  Strength and tone appears normal for age. Extremities: No clubbing, cyanosis or edema.  IABP in left groin without hematoma. Feet warm Neuro: Alert and oriented X 3. Moves all extremities spontaneously. Psych:  Responds to questions appropriately with a normal  affect.  ASSESSMENT AND PLAN: 1. CAD. Initial admission with NSTEMI. 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 Developed recurrent chest pain with anterior STEMI on 11/14/14. Underwent emergent PCI and IABP followed by emergent CABG. Troponin increased to 76. EF 40% by TEE (preop Echo showed EF 45%). Once BP has recovered sufficiently will need to start ACEi and beta blocker. Continue ASA and statin. Would consider Plavix prior to DC. Will need Echo in 3 months. 2. Hyperlipidemia on statin.  3. Family history of CAD    Present on Admission:  . NSTEMI (non-ST elevated myocardial infarction) . Dyslipidemia (high LDL; low HDL)  Signed, Shenae Bonanno [place], MDFACC 11/17/2014 7:25 AM

## 2014-11-17 NOTE — Progress Notes (Signed)
2 Days Post-Op Procedure(s) (LRB): CORONARY ARTERY BYPASS GRAFTING (CABG), ON PUMP, TIMES THREE, USING LEFT INTERNAL MAMMARY ARTERY, RIGHT GREATER SAPHENOUS VEIN HARVESTED ENDOSCOPICALLY (N/A) TRANSESOPHAGEAL ECHOCARDIOGRAM (TEE) (N/A) Subjective: Some incisional pain  Objective: Vital signs in last 24 hours: Temp:  [97.5 F (36.4 C)-100.4 F (38 C)] 99.3 F (37.4 C) (03/31 0729) Pulse Rate:  [92-118] 110 (03/31 0700) Cardiac Rhythm:  [-] Sinus tachycardia (03/31 0600) Resp:  [1-31] 18 (03/31 0700) BP: (94-128)/(54-83) 114/78 mmHg (03/31 0700) SpO2:  [87 %-98 %] 96 % (03/31 0700) Arterial Line BP: (98-142)/(58-75) 132/75 mmHg (03/31 0400) FiO2 (%):  [55 %] 55 % (03/31 0600) Weight:  [273 lb 5.9 oz (124 kg)] 273 lb 5.9 oz (124 kg) (03/31 0500)  Hemodynamic parameters for last 24 hours: PAP: (31-38)/(15-23) 35/19 mmHg CO:  [5.6 L/min-7.2 L/min] 6.4 L/min CI:  [2.3 L/min/m2-3 L/min/m2] 2.7 L/min/m2  Intake/Output from previous day: 03/30 0701 - 03/31 0700 In: 1577.7 [P.O.:200; I.V.:847.7; NG/GT:30; IV Piggyback:500] Out: 3140 [Urine:2990; Chest Tube:150] Intake/Output this shift:    General appearance: alert, cooperative and no distress Neurologic: intact Heart: regular rate and rhythm Lungs: diminished breath sounds bibasilar L>R Abdomen: normal findings: soft, non-tender  Lab Results:  Recent Labs  11/16/14 1700 11/16/14 1722 11/17/14 0400  WBC 11.8*  --  10.2  HGB 12.8* 12.9* 12.7*  HCT 37.8* 38.0* 37.7*  PLT 83*  --  80*   BMET:  Recent Labs  11/16/14 0330  11/16/14 1722 11/17/14 0400  NA 137  --  140 138  K 4.3  --  3.7 4.1  CL 110  --  102 106  CO2 23  --   --  24  GLUCOSE 121*  --  135* 142*  BUN 14  --  19 21  CREATININE 1.15  < > 1.00 0.98  CALCIUM 8.5  --   --  8.6  < > = values in this interval not displayed.  PT/INR:  Recent Labs  11/15/14 1505  LABPROT 15.9*  INR 1.26   ABG    Component Value Date/Time   PHART 7.379 11/16/2014 0931    HCO3 22.8 11/16/2014 0931   TCO2 22 11/16/2014 1722   ACIDBASEDEF 2.0 11/16/2014 0931   O2SAT 90.0 11/16/2014 0931   CBG (last 3)   Recent Labs  11/16/14 2109 11/17/14 0033 11/17/14 0417  GLUCAP 137* 135* 135*    Assessment/Plan: S/P Procedure(s) (LRB): CORONARY ARTERY BYPASS GRAFTING (CABG), ON PUMP, TIMES THREE, USING LEFT INTERNAL MAMMARY ARTERY, RIGHT GREATER SAPHENOUS VEIN HARVESTED ENDOSCOPICALLY (N/A) TRANSESOPHAGEAL ECHOCARDIOGRAM (TEE) (N/A) -  POD # 2 emergency CABG for STEMI  CV- in SR, on beta blocker, statin, ASA, add ACE-I prior to dc  RESP_on venit mask some pulmonary edema and left lower lobe atelectasis  Diurese, IS, add flutter  RENAL- lytes and creatinine OK, diurese as BP allows  ENDO_ CBG better- change to AC/HS  DVT prophylaxis- SCD , hold off on lovenox due to thrombocytopenia  Mobilize  Needs to stay in ICU until O2 requirement down   LOS: 4 days    Seth Riddle 11/17/2014

## 2014-11-17 NOTE — Progress Notes (Signed)
Patient ID: Seth Riddle, male   DOB: Jan 28, 1961, 53 y.o.   MRN: [id number] EVENING ROUNDS NOTE :     [address]       [place] [address]             [phone]                 2 Days Post-Op Procedure(s) (LRB): CORONARY ARTERY BYPASS GRAFTING (CABG), ON PUMP, TIMES THREE, USING LEFT INTERNAL MAMMARY ARTERY, RIGHT GREATER SAPHENOUS VEIN HARVESTED ENDOSCOPICALLY (N/A) TRANSESOPHAGEAL ECHOCARDIOGRAM (TEE) (N/A)  Total Length of Stay:  LOS: 4 days  BP 115/71 mmHg  Pulse 102  Temp(Src) 98.7 F (37.1 C) (Oral)  Resp 21  Ht 5\' 11"  (1.803 m)  Wt 273 lb 5.9 oz (124 kg)  BMI 38.14 kg/m2  SpO2 93%  .Intake/Output      03/31 0701 - 04/01 0700   P.O.    I.V. (mL/kg) 140 (1.1)   NG/GT    IV Piggyback 150   Total Intake(mL/kg) 290 (2.3)   Urine (mL/kg/hr) 1290 (0.8)   Chest Tube    Total Output 1290   Net -1000         . sodium chloride 10 mL/hr at 11/16/14 1400  . sodium chloride    . sodium chloride 20 mL (11/15/14 2306)  . DOPamine Stopped (11/17/14 0300)  . insulin (NOVOLIN-R) infusion Stopped (11/16/14 2100)  . lactated ringers 20 mL (11/15/14 1950)  . lactated ringers 20 mL/hr at 11/16/14 1300  . nitroGLYCERIN    . phenylephrine (NEO-SYNEPHRINE) Adult infusion Stopped (11/16/14 1700)     Lab Results  Component Value Date   WBC 10.2 11/17/2014   HGB 12.7* 11/17/2014   HCT 37.7* 11/17/2014   PLT 80* 11/17/2014   GLUCOSE 142* 11/17/2014   CHOL 174 11/14/2014   TRIG 168* 11/14/2014   HDL 24* 11/14/2014   LDLCALC 116* 11/14/2014   ALT 74* 11/14/2014   AST 55* 11/14/2014   NA 138 11/17/2014   K 4.1 11/17/2014   CL 106 11/17/2014   CREATININE 0.98 11/17/2014   BUN 21 11/17/2014   CO2 24 11/17/2014   INR 1.26 11/15/2014   HGBA1C 6.6* 11/13/2014   Alert neuro inatct,  plts 80,00 Breathing better Walked in unit today  Delight Ovens MD  Beeper [phone] Office [phone] 11/17/2014 8:07 PM

## 2014-11-18 ENCOUNTER — Inpatient Hospital Stay (HOSPITAL_COMMUNITY): Payer: BC Managed Care – PPO

## 2014-11-18 LAB — COMPREHENSIVE METABOLIC PANEL
ALK PHOS: 46 U/L (ref 39–117)
ALT: 39 U/L (ref 0–53)
AST: 56 U/L — AB (ref 0–37)
Albumin: 3.1 g/dL — ABNORMAL LOW (ref 3.5–5.2)
Anion gap: 3 — ABNORMAL LOW (ref 5–15)
BUN: 28 mg/dL — ABNORMAL HIGH (ref 6–23)
CO2: 32 mmol/L (ref 19–32)
CREATININE: 1.1 mg/dL (ref 0.50–1.35)
Calcium: 8.7 mg/dL (ref 8.4–10.5)
Chloride: 102 mmol/L (ref 96–112)
GFR calc Af Amer: 86 mL/min — ABNORMAL LOW (ref >90)
GFR, EST NON AFRICAN AMERICAN: 74 mL/min — AB (ref >90)
Glucose, Bld: 120 mg/dL — ABNORMAL HIGH (ref 70–99)
Potassium: 4.1 mmol/L (ref 3.5–5.1)
Sodium: 137 mmol/L (ref 135–145)
Total Bilirubin: 1.9 mg/dL — ABNORMAL HIGH (ref 0.3–1.2)
Total Protein: 5.7 g/dL — ABNORMAL LOW (ref 6.0–8.3)

## 2014-11-18 LAB — CBC
HCT: 40 % (ref 39.0–52.0)
Hemoglobin: 13.1 g/dL (ref 13.0–17.0)
MCH: 30 pg (ref 26.0–34.0)
MCHC: 32.8 g/dL (ref 30.0–36.0)
MCV: 91.5 fL (ref 78.0–100.0)
PLATELETS: 96 10*3/uL — AB (ref 150–400)
RBC: 4.37 MIL/uL (ref 4.22–5.81)
RDW: 13.3 % (ref 11.5–15.5)
WBC: 10.7 10*3/uL — ABNORMAL HIGH (ref 4.0–10.5)

## 2014-11-18 LAB — GLUCOSE, CAPILLARY
GLUCOSE-CAPILLARY: 140 mg/dL — AB (ref 70–99)
GLUCOSE-CAPILLARY: 102 mg/dL — AB (ref 70–99)
Glucose-Capillary: 128 mg/dL — ABNORMAL HIGH (ref 70–99)
Glucose-Capillary: 109 mg/dL — ABNORMAL HIGH (ref 70–99)

## 2014-11-18 MED ORDER — SODIUM CHLORIDE 0.9 % IV SOLN: 250.0000 mL | INTRAVENOUS | Status: DC | PRN

## 2014-11-18 MED ORDER — POTASSIUM CHLORIDE CRYS ER 20 MEQ PO TBCR
20.0000 meq | EXTENDED_RELEASE_TABLET | Freq: Two times a day (BID) | ORAL | Status: DC
  Administered 2014-11-18 – 2014-11-21 (×7): 20 meq via ORAL
  Filled 2014-11-18 (×8): qty 1

## 2014-11-18 MED ORDER — LEVALBUTEROL HCL 0.63 MG/3ML IN NEBU: 0.6300 mg | INHALATION_SOLUTION | Freq: Four times a day (QID) | RESPIRATORY_TRACT | Status: DC | PRN

## 2014-11-18 MED ORDER — LISINOPRIL 5 MG PO TABS
5.0000 mg | ORAL_TABLET | Freq: Every day | ORAL | Status: DC
  Administered 2014-11-18 – 2014-11-20 (×3): 5 mg via ORAL
  Filled 2014-11-18 (×5): qty 1

## 2014-11-18 MED ORDER — GUAIFENESIN-DM 100-10 MG/5ML PO SYRP: 15.0000 mL | ORAL_SOLUTION | ORAL | Status: DC | PRN

## 2014-11-18 MED ORDER — SODIUM CHLORIDE 0.9 % IJ SOLN: 3.0000 mL | INTRAMUSCULAR | Status: DC | PRN

## 2014-11-18 MED ORDER — MAGNESIUM HYDROXIDE 400 MG/5ML PO SUSP: 30.0000 mL | Freq: Every day | ORAL | Status: DC | PRN

## 2014-11-18 MED ORDER — SODIUM CHLORIDE 0.9 % IJ SOLN
3.0000 mL | Freq: Two times a day (BID) | INTRAMUSCULAR | Status: DC
  Administered 2014-11-19 – 2014-11-21 (×5): 3 mL via INTRAVENOUS

## 2014-11-18 MED ORDER — ALUM & MAG HYDROXIDE-SIMETH 200-200-20 MG/5ML PO SUSP: 15.0000 mL | ORAL | Status: DC | PRN

## 2014-11-18 MED ORDER — MOVING RIGHT ALONG BOOK
Freq: Once | Status: DC
  Filled 2014-11-18: qty 1

## 2014-11-18 MED ORDER — FUROSEMIDE 40 MG PO TABS
40.0000 mg | ORAL_TABLET | Freq: Every day | ORAL | Status: DC
  Administered 2014-11-18 – 2014-11-21 (×4): 40 mg via ORAL
  Filled 2014-11-18 (×4): qty 1

## 2014-11-18 MED ORDER — ALPRAZOLAM 0.25 MG PO TABS: 0.2500 mg | ORAL_TABLET | Freq: Four times a day (QID) | ORAL | Status: DC | PRN

## 2014-11-18 NOTE — Evaluation (Signed)
Physical Therapy Evaluation Patient Details Name: Seth Riddle MRN: [id number] DOB: 1960/10/11 Today's Date: 11/18/2014   History of Present Illness  54 y.o. male with CAD. Initial admission with NSTEMI. 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 Developed recurrent chest pain with anterior STEMI on 11/14/14. Underwent emergent PCI and IABP followed by emergent CABG  Clinical Impression  Patient is seen following the above procedure and presents with functional limitations due to the deficits listed below (see PT Problem List). Ambulates slowly up to 160 feet today while using a RW for stability. Reviewed safe mobility techniques and sternal precautions. SpO2 93% and greater on 3L supplemental O2, 84% with brief trail of room air. HR highest at 100 during therapy session. Has 24 hour supervision at home. Patient will benefit from skilled PT to increase their independence and safety with mobility to allow discharge to the venue listed below.       Follow Up Recommendations Other (comment) (cardiac rehab)    Equipment Recommendations  Rolling walker with 5" wheels;3in1 (PT)    Recommendations for Other Services OT consult     Precautions / Restrictions Precautions Precautions: Sternal;Fall Restrictions Weight Bearing Restrictions: No      Mobility  Bed Mobility               General bed mobility comments: In chair  Transfers Overall transfer level: Needs assistance Equipment used: Rolling walker (2 wheeled) Transfers: Sit to/from Stand Sit to Stand: Min guard         General transfer comment: Min guard for safety. VC for sternal precautions and hand placement, using heart pillow for splinting. Became nauseous upon standing first attempt and required to sit. Tolerated better on second trial.  Ambulation/Gait Ambulation/Gait assistance: Min guard Ambulation Distance (Feet): 160 Feet Assistive device: Rolling walker (2 wheeled) Gait Pattern/deviations: Step-through  pattern;Decreased stride length Gait velocity: slow   General Gait Details: Educated on safe DME use with cues for sternal precautions by lightly touching RW for support. Very guarded and slow but stable with no loss of balance while holding RW for support. Required one standing rest break to maintain precautions. SpO2 93% and greater on 3L supplemental O2  Stairs            Wheelchair Mobility    Modified Rankin (Stroke Patients Only)       Balance                                             Pertinent Vitals/Pain Pain Assessment: 0-10 Pain Score: 0-No pain Pain Location: "No discomfort in chest right now" Pain Intervention(s): Monitored during session;Repositioned    Home Living Family/patient expects to be discharged to:: Private residence Living Arrangements: Spouse/significant other;Children Available Help at Discharge: Family;Available 24 hours/day Type of Home: House Home Access: Stairs to enter Entrance Stairs-Rails: None Entrance Stairs-Number of Steps: 4 Home Layout: Multi-level;Able to live on main level with bedroom/bathroom Home Equipment: None      Prior Function Level of Independence: Independent               Hand Dominance   Dominant Hand: Right    Extremity/Trunk Assessment   Upper Extremity Assessment: Defer to OT evaluation           Lower Extremity Assessment: Generalized weakness         Communication   Communication: No difficulties  Cognition Arousal/Alertness: Awake/alert Behavior During Therapy: WFL for tasks assessed/performed Overall Cognitive Status: Within Functional Limits for tasks assessed                      General Comments General comments (skin integrity, edema, etc.): Provided sternal precaution handout and reviewed with pt and family.     Exercises General Exercises - Lower Extremity Ankle Circles/Pumps: AROM;Both;10 reps;Seated      Assessment/Plan    PT Assessment  Patient needs continued PT services  PT Diagnosis Difficulty walking;Acute pain;Generalized weakness;Abnormality of gait   PT Problem List Decreased strength;Decreased range of motion;Decreased activity tolerance;Decreased balance;Decreased mobility;Decreased knowledge of use of DME;Decreased knowledge of precautions;Cardiopulmonary status limiting activity;Pain  PT Treatment Interventions DME instruction;Gait training;Stair training;Functional mobility training;Therapeutic activities;Therapeutic exercise;Balance training;Neuromuscular re-education;Patient/family education;Modalities   PT Goals (Current goals can be found in the Care Plan section) Acute Rehab PT Goals Patient Stated Goal: None stated PT Goal Formulation: With patient Time For Goal Achievement: 12/02/14 Potential to Achieve Goals: Good    Frequency Min 3X/week   Barriers to discharge        Co-evaluation               End of Session Equipment Utilized During Treatment: Gait belt;Oxygen Activity Tolerance: Patient tolerated treatment well Patient left: in chair;with call bell/phone within reach;with family/visitor present Nurse Communication: Mobility status (SpO2 left on 3L with stats of 93%)         Time: 5726-2035 PT Time Calculation (min) (ACUTE ONLY): 35 min   Charges:   PT Evaluation $Initial PT Evaluation Tier I: 1 Procedure PT Treatments $Gait Training: 8-22 mins   PT G CodesBerton Mount 11/18/2014, 1:44 PM Sunday Spillers [place], [place] [phone]

## 2014-11-18 NOTE — Progress Notes (Signed)
Pt transported to 2W22 with belongings on 4L O2. Report given to Belenda Cruise, receiving RN. Belenda Cruise, RN present at bedside. VSS. Pt assisted to chair.

## 2014-11-18 NOTE — Progress Notes (Signed)
3 Days Post-Op Procedure(s) (LRB): CORONARY ARTERY BYPASS GRAFTING (CABG), ON PUMP, TIMES THREE, USING LEFT INTERNAL MAMMARY ARTERY, RIGHT GREATER SAPHENOUS VEIN HARVESTED ENDOSCOPICALLY (N/A) TRANSESOPHAGEAL ECHOCARDIOGRAM (TEE) (N/A) Subjective: Feels a little better this AM   Objective: Vital signs in last 24 hours: Temp:  [98 F (36.7 C)-98.7 F (37.1 C)] 98.3 F (36.8 C) (04/01 0400) Pulse Rate:  [83-107] 96 (04/01 0700) Cardiac Rhythm:  [-] Normal sinus rhythm (04/01 0400) Resp:  [14-28] 19 (04/01 0700) BP: (102-128)/(68-82) 117/78 mmHg (04/01 0700) SpO2:  [90 %-97 %] 94 % (04/01 0700) Weight:  [272 lb 14.9 oz (123.8 kg)] 272 lb 14.9 oz (123.8 kg) (04/01 0442)  Hemodynamic parameters for last 24 hours:    Intake/Output from previous day: 03/31 0701 - 04/01 0700 In: 990 [P.O.:480; I.V.:360; IV Piggyback:150] Out: 3140 [Urine:3140] Intake/Output this shift:    General appearance: alert, cooperative and affect is flat Neurologic: intact Heart: regular rate and rhythm Lungs: diminished breath sounds bibasilar Abdomen: normal findings: soft, non-tender  Lab Results:  Recent Labs  11/17/14 0400 11/18/14 0605  WBC 10.2 10.7*  HGB 12.7* 13.1  HCT 37.7* 40.0  PLT 80* 96*   BMET:  Recent Labs  11/17/14 0400 11/18/14 0605  NA 138 137  K 4.1 4.1  CL 106 102  CO2 24 32  GLUCOSE 142* 120*  BUN 21 28*  CREATININE 0.98 1.10  CALCIUM 8.6 8.7    PT/INR:  Recent Labs  11/15/14 1505  LABPROT 15.9*  INR 1.26   ABG    Component Value Date/Time   PHART 7.379 11/16/2014 0931   HCO3 22.8 11/16/2014 0931   TCO2 22 11/16/2014 1722   ACIDBASEDEF 2.0 11/16/2014 0931   O2SAT 90.0 11/16/2014 0931   CBG (last 3)   Recent Labs  11/17/14 1157 11/17/14 1636 11/17/14 2142  GLUCAP 133* 110* 97    Assessment/Plan: S/P Procedure(s) (LRB): CORONARY ARTERY BYPASS GRAFTING (CABG), ON PUMP, TIMES THREE, USING LEFT INTERNAL MAMMARY ARTERY, RIGHT GREATER SAPHENOUS  VEIN HARVESTED ENDOSCOPICALLY (N/A) TRANSESOPHAGEAL ECHOCARDIOGRAM (TEE) (N/A) -  POD # 3 emergency CABG  CV- stable, maintaining SR, start ACE-I  S/p MI- continue ASA, beta blocker, statin  RESP- bibasilar atelectasis- IS  RENAL- lytes and creatinine OK  Dc foley  Po lasix  ENDO- CBG better- dc levemir- continue SSI  Thrombocytopenia- improved, continue SCD for DVT prophylaxis   LOS: 5 days    Loreli Slot 11/18/2014

## 2014-11-18 NOTE — Progress Notes (Addendum)
TELEMETRY: Reviewed telemetry pt in NSR with rare PVC  Filed Vitals:   11/18/14 0442 11/18/14 0500 11/18/14 0600 11/18/14 0700  BP:  112/73 103/71 117/78  Pulse:  92 98 96  Temp:      TempSrc:      Resp:  Height:      Weight: 272 lb 14.9 oz (123.8 kg)     SpO2:  94% 93% 94%    Intake/Output Summary (Last 24 hours) at 11/18/14 0808 Last data filed at 11/18/14 0700  Gross per 24 hour  Intake    990 ml  Output   3015 ml  Net  -2025 ml   Filed Weights   11/15/14 0400 11/17/14 0500 11/18/14 0442  Weight: 269 lb 2.9 oz (122.1 kg) 273 lb 5.9 oz (124 kg) 272 lb 14.9 oz (123.8 kg)    Subjective Patient sitting up.Feels pretty well. Breathing is better. Less chest pain.  Marland Kitchen acetaminophen  1,000 mg Oral 4 times per day   Or  . acetaminophen (TYLENOL) oral liquid 160 mg/5 mL  1,000 mg Per Tube 4 times per day  . antiseptic oral rinse  7 mL Mouth Rinse QID  . aspirin EC  325 mg Oral Daily   Or  . aspirin  324 mg Per Tube Daily  . atorvastatin  80 mg Oral q1800  . bisacodyl  10 mg Oral Daily   Or  . bisacodyl  10 mg Rectal Daily  . Chlorhexidine Gluconate Cloth  6 each Topical Daily  . docusate sodium  200 mg Oral Daily  . enoxaparin (LOVENOX) injection  40 mg Subcutaneous QHS  . furosemide  40 mg Oral Daily  . insulin aspart  0-15 Units Subcutaneous TID WC  . levalbuterol  0.63 mg Nebulization Q6H  . lisinopril  5 mg Oral Daily  . metoprolol tartrate  12.5 mg Oral BID   Or  . metoprolol tartrate  12.5 mg Per Tube BID  . mupirocin ointment  1 application Nasal BID  . pantoprazole  40 mg Oral Daily  . potassium chloride  20 mEq Oral BID  . sodium chloride  3 mL Intravenous Q12H   . sodium chloride 10 mL/hr at 11/16/14 1400  . sodium chloride    . sodium chloride 20 mL (11/15/14 2306)  . DOPamine Stopped (11/17/14 0300)  . insulin (NOVOLIN-R) infusion Stopped (11/16/14 2100)  . lactated ringers 20 mL (11/15/14 1950)  . lactated ringers 20 mL/hr at 11/16/14  1300  . nitroGLYCERIN    . phenylephrine (NEO-SYNEPHRINE) Adult infusion Stopped (11/16/14 1700)    LABS: Basic Metabolic Panel:  Recent Labs  40/98/11 0330 11/16/14 1700  11/17/14 0400 11/18/14 0605  NA 137  --   < > 138 137  K 4.3  --   < > 4.1 4.1  CL 110  --   < > 106 102  CO2 23  --   --  24 32  GLUCOSE 121*  --   < > 142* 120*  BUN 14  --   < > 21 28*  CREATININE 1.15 1.15  < > 0.98 1.10  CALCIUM 8.5  --   --  8.6 8.7  MG 2.3 2.0  --   --   --   < > = values in this interval not displayed. Liver Function Tests:  Recent Labs  11/18/14 0605  AST 56*  ALT 39  ALKPHOS 46  BILITOT 1.9*  PROT 5.7*  ALBUMIN 3.1*  No results for input(s): LIPASE, AMYLASE in the last 72 hours. CBC:  Recent Labs  11/17/14 0400 11/18/14 0605  WBC 10.2 10.7*  HGB 12.7* 13.1  HCT 37.7* 40.0  MCV 89.8 91.5  PLT 80* 96*   Cardiac Enzymes:  Recent Labs  11/16/14 0330  TROPONINI 76.50*   BNP: No results for input(s): PROBNP in the last 72 hours. D-Dimer: No results for input(s): DDIMER in the last 72 hours. Hemoglobin A1C: No results for input(s): HGBA1C in the last 72 hours. Fasting Lipid Panel: No results for input(s): CHOL, HDL, LDLCALC, TRIG, CHOLHDL, LDLDIRECT in the last 72 hours. Thyroid Function Tests: No results for input(s): TSH, T4TOTAL, T3FREE, THYROIDAB in the last 72 hours.  Invalid input(s): FREET3   Radiology/Studies:  Dg Chest Port 1 View  11/18/2014   CLINICAL DATA:  Status post CABG on November 15, 2014  EXAM: PORTABLE CHEST - 1 VIEW  COMPARISON:  Portable chest x-ray of November 17, 2014  FINDINGS: The right lung is adequately inflated. On the left the retrocardiac region remains dense and the hemidiaphragm is obscured. The perihilar lung markings on the left are more conspicuous. The cardiac silhouette remains enlarged. The pulmonary vascularity remains engorged and indistinct. 6 intact sternal wires are visible. A right internal jugular Cordis sheath has  its tip at the junction of the internal jugular vein with the subclavian vein.  IMPRESSION: Slight interval deterioration in the appearance of the chest suggesting worsening of pulmonary edema. There is persistent left lower lobe atelectasis and small pleural effusion.   Electronically Signed   By: David  [place]   On: 11/18/2014 07:46   Dg Chest Port 1 View  11/17/2014   CLINICAL DATA:  CABG  EXAM: PORTABLE CHEST - 1 VIEW  COMPARISON:  11/16/2014  FINDINGS: Right jugular sheath remains in place with the tip in the innominate vein on the right.  Cardiac enlargement with prior CABG. Improved aeration of the lungs suggesting improvement in bilateral atelectasis. Left lower lobe atelectasis remains. Negative for edema. Possible small left effusion  IMPRESSION: Improved aeration in the lungs bilaterally with decrease in bilateral atelectasis. There remains left lower lobe atelectasis and possible small left effusion.   Electronically Signed   By: Marlan Palau M.D.   On: 11/17/2014 07:48   Dg Chest Port 1 View  11/16/2014   CLINICAL DATA:  Chest tube removal  EXAM: PORTABLE CHEST - 1 VIEW  COMPARISON:  Earlier film of the same date  FINDINGS: Patient has been extubated and the nasogastric tube removed. Left chest tube has been removed with no pneumothorax evident. Swan-G[place] has been retracted leaving the right IJ venous sheath in place. Previous CABG. Lower lung volumes with increasing perihilar and infrahilar atelectasis or infiltrates, left greater than right . No definite effusion.  IMPRESSION: 1. Left chest tube removal, with no pneumothorax. 2. Extubation, with lower lung volumes, increasing perihilar and bibasilar atelectasis or infiltrates, left worse than right.   Electronically Signed   By: Corlis Leak M.D.   On: 11/16/2014 22:06     PHYSICAL EXAM General: Well developed, well nourished, WM on [place]: Normocephalic, atraumatic, sclera non-icteric, oropharynx is clear Neck: Negative for carotid  bruits. JVD not elevated. No adenopathy Lungs: decreased BS bases.  Breathing is unlabored. Heart: RRR S1 S2 without murmurs, rubs, or gallops.  Abdomen: Soft, non-tender, non-distended with normoactive bowel sounds. No hepatomegaly. No rebound/guarding. No obvious abdominal masses. Msk:  Strength and tone appears normal for age. Extremities: No  clubbing, cyanosis or edema.  IABP in left groin without hematoma. Feet warm Neuro: Alert and oriented X 3. Moves all extremities spontaneously. Psych:  Responds to questions appropriately with a normal affect.  ASSESSMENT AND PLAN: 1. CAD. Initial admission with NSTEMI. 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 Developed recurrent chest pain with anterior STEMI on 11/14/14. Underwent emergent PCI and IABP followed by emergent CABG. Troponin increased to 76. EF 40% by TEE (preop Echo showed EF 45%). On beta blocker. Will add low dose ACEi.  Continue ASA and statin. Would consider Plavix prior to DC. Will need Echo in 3 months. 2. Hyperlipidemia on statin.  3. Family history of CAD 4. Ischemic cardiomyopathy EF 40% by TEE.  Patient is progressing well. Plan to move to telemetry today. Ambulate. Need to mobilize fluid retention with oral diuretics.    Present on Admission:  . NSTEMI (non-ST elevated myocardial infarction) . Dyslipidemia (high LDL; low HDL)  Signed, Hadas Jessop [place], MDFACC 11/18/2014 8:08 AM

## 2014-11-19 LAB — BASIC METABOLIC PANEL
Anion gap: 4 — ABNORMAL LOW (ref 5–15)
BUN: 29 mg/dL — ABNORMAL HIGH (ref 6–23)
CHLORIDE: 106 mmol/L (ref 96–112)
CO2: 29 mmol/L (ref 19–32)
Calcium: 8.7 mg/dL (ref 8.4–10.5)
Creatinine, Ser: 0.98 mg/dL (ref 0.50–1.35)
GFR calc non Af Amer: >90 mL/min (ref >90)
Glucose, Bld: 134 mg/dL — ABNORMAL HIGH (ref 70–99)
POTASSIUM: 4 mmol/L (ref 3.5–5.1)
SODIUM: 139 mmol/L (ref 135–145)

## 2014-11-19 LAB — GLUCOSE, CAPILLARY
GLUCOSE-CAPILLARY: 129 mg/dL — AB (ref 70–99)
GLUCOSE-CAPILLARY: 124 mg/dL — AB (ref 70–99)
Glucose-Capillary: 121 mg/dL — ABNORMAL HIGH (ref 70–99)
Glucose-Capillary: 97 mg/dL (ref 70–99)

## 2014-11-19 LAB — CBC
HEMATOCRIT: 38.8 % — AB (ref 39.0–52.0)
Hemoglobin: 12.7 g/dL — ABNORMAL LOW (ref 13.0–17.0)
MCH: 29.6 pg (ref 26.0–34.0)
MCHC: 32.7 g/dL (ref 30.0–36.0)
MCV: 90.4 fL (ref 78.0–100.0)
PLATELETS: 145 10*3/uL — AB (ref 150–400)
RBC: 4.29 MIL/uL (ref 4.22–5.81)
RDW: 13.2 % (ref 11.5–15.5)
WBC: 8.5 10*3/uL (ref 4.0–10.5)

## 2014-11-19 MED ORDER — LACTULOSE 10 GM/15ML PO SOLN: 20.0000 g | Freq: Every day | ORAL | Status: DC | PRN

## 2014-11-19 NOTE — Progress Notes (Addendum)
      [address]       [place] [address]             [phone]      4 Days Post-Op Procedure(s) (LRB): CORONARY ARTERY BYPASS GRAFTING (CABG), ON PUMP, TIMES THREE, USING LEFT INTERNAL MAMMARY ARTERY, RIGHT GREATER SAPHENOUS VEIN HARVESTED ENDOSCOPICALLY (N/A) TRANSESOPHAGEAL ECHOCARDIOGRAM (TEE) (N/A)   Subjective:  [name] is doing okay.  He states that he still has some chest discomfort especially when trying to get out of bed.  He and his family had multiple questions that were addressed at length.  He is ambulating.  No BM  Objective: Vital signs in last 24 hours: Temp:  [98.2 F (36.8 C)-98.6 F (37 C)] 98.5 F (36.9 C) (04/02 0338) Pulse Rate:  [84-102] 94 (04/02 0338) Cardiac Rhythm:  [-] Normal sinus rhythm (04/02 0844) Resp:  [17-22] 21 (04/02 0338) BP: (97-118)/(68-80) 116/74 mmHg (04/02 0338) SpO2:  [92 %-97 %] 96 % (04/02 0338) Weight:  [270 lb 15.1 oz (122.9 kg)] 270 lb 15.1 oz (122.9 kg) (04/02 0338)  Intake/Output from previous day: 04/01 0701 - 04/02 0700 In: 562 [P.O.:562] Out: 650 [Urine:650]  General appearance: alert, cooperative and no distress Heart: regular rate and rhythm Lungs: clear to auscultation bilaterally Abdomen: soft, non-tender; bowel sounds normal; no masses,  no organomegaly Extremities: edema trace Wound: clean and dyr  Lab Results:  Recent Labs  11/18/14 0605 11/19/14 0518  WBC 10.7* 8.5  HGB 13.1 12.7*  HCT 40.0 38.8*  PLT 96* 145*   BMET:  Recent Labs  11/18/14 0605 11/19/14 0518  NA 137 139  K 4.1 4.0  CL 102 106  CO2 32 29  GLUCOSE 120* 134*  BUN 28* 29*  CREATININE 1.10 0.98  CALCIUM 8.7 8.7    PT/INR: No results for input(s): LABPROT, INR in the last 72 hours. ABG    Component Value Date/Time   PHART 7.379 11/16/2014 0931   HCO3 22.8 11/16/2014 0931   TCO2 22 11/16/2014 1722   ACIDBASEDEF 2.0 11/16/2014 0931   O2SAT 90.0 11/16/2014 0931   CBG (last 3)   Recent Labs  11/18/14 1646 11/18/14 2126 11/19/14 0641  GLUCAP 109* 102* 129*    Assessment/Plan: S/P Procedure(s) (LRB): CORONARY ARTERY BYPASS GRAFTING (CABG), ON PUMP, TIMES THREE, USING LEFT INTERNAL MAMMARY ARTERY, RIGHT GREATER SAPHENOUS VEIN HARVESTED ENDOSCOPICALLY (N/A) TRANSESOPHAGEAL ECHOCARDIOGRAM (TEE) (N/A)  1. CV- NSR, good rate and pressure control, continue Lopressor, Lisinopril 2. Pulm- wean oxygen as tolerated, good use of IS 3. Renal- creatinine WNL, + hypervolemia weight is up 10 lbs continue diuresis 4. DM- new diagnosis, Hgb A1c is 6.6 will start low dose Metformin, place diabetes education consult 5. Thrombocytopenia- improving 6. Dispo- patient doing well, will d/c EPW, place diabetes education consult   LOS: 6 days    BARRETT, ERIN 11/19/2014   I have seen and examined the patient and agree with the assessment and plan as outlined.  Purcell Nails 11/19/2014 12:02 PM

## 2014-11-19 NOTE — Progress Notes (Signed)
Inpatient Diabetes Program Recommendations  AACE/ADA: New Consensus Statement on Inpatient Glycemic Control (2013)  Target Ranges:  Prepandial:   less than 140 mg/dL      Peak postprandial:   less than 180 mg/dL (1-2 hours)      Critically ill patients:  140 - 180 mg/dL   Reason: Consult Since new diagnosis lifestyle modification through diet and exercise is first on the list.  Metformin is ok if not otherwise contraindicated. Pt should get a glucose meter and monitor CBGs at least once daily at different times of the day and record for PCP. Thank you  Piedad Climes BSN, RN,CDE Inpatient Diabetes Coordinator [phone] (team pager)

## 2014-11-19 NOTE — Progress Notes (Signed)
CARDIAC REHAB PHASE I   PRE:  Rate/Rhythm: 100 ST    BP: pt at door    SaO2: 90-91 2 1/2L  MODE:  Ambulation: 890 ft   POST:  Rate/Rhythm: 114 ST    BP: sitting 117/77     SaO2: 88-91 3L after walk, 93 2L at rest   Pt up at sink brushing teeth independently. Sts he feels well. Able to walk with RW long distance, 890 ft. No assist needed. Denied c/o. SaO2 88-91 on 3L with spot checks. To recliner after walk. Turned O2 down to 2L at rest. Encouraged IS/flutter valve, more walking and sitting in recliner. Voiced understanding. Family is very attentive and I question if pt would like a break from them. [phone]  Elissa Lovett Holland CES, ACSM 11/19/2014 11:10 AM

## 2014-11-19 NOTE — Progress Notes (Signed)
Patient ambulated in the hall with front wheel rolling walker and standby assist. Patient started his walk on 2 liters of O2 but dropped saturation to 87%, oxygen was eventually increased to 5 liters during his walk. Patient did not feel sob, did not require rest stop and stated he felt fine during the entire walk. Patient returned to beside with call bell in reached, placed on oxygen at 2 liters, saturating at 94%. Will continue to monitor.

## 2014-11-19 NOTE — Progress Notes (Signed)
dc'ed pacing wires pt. tolerated well 

## 2014-11-20 LAB — GLUCOSE, CAPILLARY
GLUCOSE-CAPILLARY: 132 mg/dL — AB (ref 70–99)
Glucose-Capillary: 117 mg/dL — ABNORMAL HIGH (ref 70–99)
Glucose-Capillary: 132 mg/dL — ABNORMAL HIGH (ref 70–99)
Glucose-Capillary: 108 mg/dL — ABNORMAL HIGH (ref 70–99)

## 2014-11-20 MED ORDER — METOPROLOL TARTRATE 25 MG PO TABS
25.0000 mg | ORAL_TABLET | Freq: Two times a day (BID) | ORAL | Status: DC
  Administered 2014-11-20 – 2014-11-21 (×3): 25 mg via ORAL
  Filled 2014-11-20 (×3): qty 1

## 2014-11-20 MED ORDER — METOPROLOL TARTRATE 25 MG/10 ML ORAL SUSPENSION
25.0000 mg | Freq: Two times a day (BID) | ORAL | Status: DC
  Filled 2014-11-20 (×3): qty 10

## 2014-11-20 MED ORDER — BLOOD GLUCOSE MONITOR KIT: PACK | Status: DC

## 2014-11-20 NOTE — Discharge Summary (Signed)
Physician Discharge Summary  Patient ID: JOHNELL BAS MRN: [id number] DOB/AGE: 02-15-1961 54 y.o.  Admit date: 11/13/2014 Discharge date: 11/21/2014  Admission Diagnoses: Non STelevation MI Dyslipidemia Family history of CAD                                Discharge Diagnoses:   Patient Active Problem List   Diagnosis Date Noted  . 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 ST elevation (STEMI) myocardial infarction involving left anterior descending coronary artery   . Dyslipidemia (high LDL; low HDL)   . Family history of premature CAD   . Acute chest pain   . NSTEMI (non-ST elevated myocardial infarction) 11/13/2014  . Plantar fasciitis, right 12/10/2012   Discharged Condition: good  History of Present Illness:  Mr. Schools is a 54 yo obese white male with medical history of dyslipidemia and significant family history of Premature CAD.  He presented to the [facility] Emergency Department with complaints of chest pain and shortness of breath.  He states the previous evening him and his wife attended a movie.  However in the middle of the night the patient was awoken from sleep with chest pain that was retrosternal and radiated into his jaw and left arm.  He took some ASA and antacids which did not provide much relief.  He was later loading his car with a suitcase preparing to leave town at which time he again suffered chest pain and shortness of breath.  His wife said that he looked bad and the patient was bent over trying to catch his breath.  He felt he should be evaluated and drove came into be evaluated.  Upon arrival to the ED, EKG was u2 remarkable.  His troponin was elevated and he was ruled in for NSTEMI.  He was admitted for further care by Cardiology.  Hospital Course:   Initially the patient was chest pain free during admission.  He was taken to the catheterization lab on 11/14/2014 at which time he was noted to have severe 2 vessel CAD with critical LAD involvement.  It was  Coronary Bypass Grafting would be indicated and TCTS consult was placed.  He was evaluated by Dr. Servando Snare who evaluated the patient and was in agreement he would require urgent Coronary Bypass Grafting procedure.  The risks and benefits of the procedure were explained to the patient and he was agreeable to proceed the next afternoon.  However, later that evening the patient developed chest pain.  He was placed on NTG and Heparin drips which were titrated without relief of his symptoms.  This progressed overnight, until he was noted to have ST changes on his EKG.  He was felt to be suffering a STEMI and was taken emergently to the cardiac catheterization lab.  During that procedure it was noticed the patient had thrombosed his LAD and a balloon pump was placed.  He was felt to now require emergent CABG procedure.  He was evaluated by Dr. Roxan Hockey who took the patient emergently to the operating room.  He underwent Emergent CABG x 3 utilizing LIMA to LAD, SVG to OM, and SVG to Diagonal.  He also underwent endoscopic saphenous vein harvest from his right leg with an open skip incision.  He tolerated the procedure without difficulty and was taken to the SICU in stable condition.  During his stay in the SICU the patient was weaned and extubated on POD #1.  He was weaned off Dopamine drip as tolerated.  His balloon pump was also weaned and removed on POD #1.  He was hypervolemic with pulmonary edema and was aggressively diuresed.  His chest tubes and arterial lines were removed without difficulty.  He was weaned off insulin drip as tolerated and his sugars have been controlled with diet and sliding scale coverage.  He was maintaining NSR and felt medically stable for transfer to the stepdown unit on POD #3.  The patient continues to make slow progress.  He is maintaining NSR and his pacing wires were removed without difficulty.  He is a new diabetic with a Hemoglobin A1c of 6.6.  He has been instructed on diet and  lifestyle modifications for initial treatment.  He will monitor his blood sugars and follow up with his PCP in 2-4 weeks.  If his blood sugars remain elevated Metformin would be an appropriate first line agent.  He is ambulating with minimal discomfort.  He is tolerating a carb modified diet.  He was on 1 liter of oxygen via [place], but was weaned to room air.  He is felt surgically stable for discharge today.   Significant Diagnostic Studies: cardiac graphics: Cardiac Catheterization   ANGIOGRAPHY:   The left main coronary artery was angiographically normal and bifurcated into the LAD and left circumflex coronary artery.   The LAD was a large caliber vessel and immmediately gave rise to a very high diagonal (ramus intermediate like vessel). There was diffuse 95% stenosis in the LAD in the region of this diagonal takeoff with 90-95% stenosis diffusely just beyond the origin of this ramus like vessel and 90% diffuse stenosis at the origin of the ramus like high diagonal vessel almost immediately after the left main.  The left circumflex coronary artery was a moderate size vessel that gave rise to one major marginal branch. There was 95% focal stenosis just beyond an ectatic segment in the proximal obtuse marginal branch and there was diffuse distal 90% marginal stenosis.  The RCA was angiographically normal , dominant vessel that gave rise to a large PDA and PLA vessel.   Left ventriculography revealed normal global LV contractility without focal segmental wall motion abnormalities. There was no evidence for mitral regurgitation. Ejection fraction is 55-60%.   Repeat Catheterization for STEMI:  ANGIOGRAPHY:  Left main was a large normal vessel which gave rise to the LAD and left circumflex vessel.  The LAD immediately gave rise to a large first diagonal branch which had a ramus intermediate, like distribution is noted in yesterday's cath had stenosis of 90%. The LAD, which had 95% stenosis at  the diagonal takeoff and beyond was now completely occluded after this high diagonal vessel. There was TIMI 0 flow.  The circumflex vessel was unchanged and had previously noted 90% stenosis in the proximal portion of the obtuse marginal vessel and diffuse distal marginal disease.  Since the LAD was occluded with TIMI 0 flow, emergent PTCA was performed to reestablish blood flow and reduce infarct size prior to emergent CABG surgery. Following PTCA with a 2.0 and 2.5 mm balloon. The 100% occlusion was reduced to oxalate 50%. This was purposely not dilated further, since the patient will undergo a LIMA graft to the LAD. There was reestablishment of TIMI-3 flow. The distal LAD had diffuse 80% stenosis prior to the apex.  Treatments: surgery:   Emergency median sternotomy, coronary artery bypass grafting x3, (left internal mammary artery to left anterior descending, saphenous vein graft to first  diagonal, saphenous vein graft to first obtuse marginal), endoscopic vein harvest, right thigh  Disposition: Home  Discharge Medications:   Medication List    STOP taking these medications        aspirin 325 MG tablet  Replaced by:  aspirin 325 MG EC tablet     aspirin 81 MG tablet      TAKE these medications        aspirin 325 MG EC tablet  Take 1 tablet (325 mg total) by mouth daily.     atorvastatin 80 MG tablet  Commonly known as:  LIPITOR  Take 1 tablet (80 mg total) by mouth daily at 6 PM.     BC HEADACHE POWDER PO  Take 1 Package by mouth daily as needed (headache).     blood glucose meter kit and supplies Kit  Dispense based on patient and insurance preference. Use up to four times daily as directed. (FOR ICD-9 250.00, 250.01).     furosemide 40 MG tablet  Commonly known as:  LASIX  Take 1 tablet (40 mg total) by mouth daily. For 5 days then stop.     lisinopril 10 MG tablet  Commonly known as:  PRINIVIL,ZESTRIL  Take 1 tablet (10 mg total) by mouth daily.      MELATONIN PO  Take 1 tablet by mouth daily as needed (sleep).     metoprolol tartrate 25 MG tablet  Commonly known as:  LOPRESSOR  Take 1 tablet (25 mg total) by mouth 2 (two) times daily.     oxyCODONE 5 MG immediate release tablet  Commonly known as:  Oxy IR/ROXICODONE  Take 1-2 tablets (5-10 mg total) by mouth every 4 (four) hours as needed for severe pain.     potassium chloride SA 20 MEQ tablet  Commonly known as:  K-DUR,KLOR-CON  Take 1 tablet (20 mEq total) by mouth daily. For 5 days then stop.       The patient has been discharged on:   1.Beta Blocker:  Yes [  x ]                              No   [   ]                              If No, reason:  2.Ace Inhibitor/ARB: Yes [  x ]                                     No  [    ]                                     If No, reason:  3.Statin:   Yes [  x ]                  No  [   ]                  If No, reason:  4.Ecasa:  Yes  [  x ]                  No   [   ]  If No, reason   Follow-up Information    Follow up with Melrose Nakayama, MD.   Specialty:  Cardiothoracic Surgery   Why:  Office will contact you with appointment   Contact information:   [address] [place] [address] [phone]       Follow up with [facility] In 4 weeks.   Why:  Please get CXR 1 hour prior to appointment   Contact information:   [facility]       Follow up with Kathlyn Sacramento, MD On 12/12/2014.   Specialty:  Cardiology   Why:  Appointment time is at 10:45 am   Contact information:   [place] [address] [phone]       Follow up with BABAOFF, Caryl Bis, MD.   Specialty:  Family Medicine   Why:  Please set up 1 month follow up for evaluation of new onset DM   Contact information:   [address] [facility] [place] [address] [phone]       Signed: Nani Skillern PA-C 11/21/2014, 10:00  AM

## 2014-11-20 NOTE — Progress Notes (Addendum)
      [address]       [place] [address]             [phone]      5 Days Post-Op Procedure(s) (LRB): CORONARY ARTERY BYPASS GRAFTING (CABG), ON PUMP, TIMES THREE, USING LEFT INTERNAL MAMMARY ARTERY, RIGHT GREATER SAPHENOUS VEIN HARVESTED ENDOSCOPICALLY (N/A) TRANSESOPHAGEAL ECHOCARDIOGRAM (TEE) (N/A)   Subjective:  Seth Riddle had some chest discomfort yesterday.  He states that he was up washing his hands at the sink and noticed his incision. He states that caused him to breath harder and caused chest discomfort. + ambulation + BM  Objective: Vital signs in last 24 hours: Temp:  [97.9 F (36.6 C)-98.7 F (37.1 C)] 97.9 F (36.6 C) (04/03 0651) Pulse Rate:  [85-102] 102 (04/03 0651) Cardiac Rhythm:  [-] Sinus tachycardia (04/02 2000) Resp:  [18-24] 24 (04/03 0651) BP: (107-120)/(42-77) 120/77 mmHg (04/03 0651) SpO2:  [95 %-100 %] 100 % (04/03 0651) Weight:  [267 lb 11.2 oz (121.428 kg)] 267 lb 11.2 oz (121.428 kg) (04/03 0651)  Intake/Output from previous day: 04/02 0701 - 04/03 0700 In: 480 [P.O.:480] Out: -  Intake/Output this shift: Total I/O In: 240 [P.O.:240] Out: -   General appearance: alert, cooperative and no distress Heart: regular rate and rhythm Lungs: clear to auscultation bilaterally Abdomen: soft, non-tender; bowel sounds normal; no masses,  no organomegaly Extremities: edema trace Wound: clean and dry  Lab Results:  Recent Labs  11/18/14 0605 11/19/14 0518  WBC 10.7* 8.5  HGB 13.1 12.7*  HCT 40.0 38.8*  PLT 96* 145*   BMET:  Recent Labs  11/18/14 0605 11/19/14 0518  NA 137 139  K 4.1 4.0  CL 102 106  CO2 32 29  GLUCOSE 120* 134*  BUN 28* 29*  CREATININE 1.10 0.98  CALCIUM 8.7 8.7    PT/INR: No results for input(s): LABPROT, INR in the last 72 hours. ABG    Component Value Date/Time   PHART 7.379 11/16/2014 0931   HCO3 22.8 11/16/2014 0931   TCO2 22 11/16/2014 1722   ACIDBASEDEF 2.0 11/16/2014 0931   O2SAT 90.0 11/16/2014 0931   CBG (last 3)   Recent Labs  11/19/14 1651 11/19/14 2148 11/20/14 0633  GLUCAP 97 124* 117*    Assessment/Plan: S/P Procedure(s) (LRB): CORONARY ARTERY BYPASS GRAFTING (CABG), ON PUMP, TIMES THREE, USING LEFT INTERNAL MAMMARY ARTERY, RIGHT GREATER SAPHENOUS VEIN HARVESTED ENDOSCOPICALLY (N/A) TRANSESOPHAGEAL ECHOCARDIOGRAM (TEE) (N/A)  1. CV- NSR, tachy- will increase Lopressor, continue Lisinopril 2. Pulm- wean oxygen as tolerated, continue IS 3. Renal- remains hypervolemic, continue diuresis 4. DM- new onset, diet and exercise as control for now, patient educated to check sugars daily and record and PCP can add Metformin in future if sugars are high 5. Dispo- patient stable, will increase lopressor for HR, ambulation, wires out, likely home in AM   LOS: 7 days    BARRETT, ERIN 11/20/2014  I have seen and examined the patient and agree with the assessment and plan as outlined.  Possible d/c home 1-2 days.  All questions answered.  Purcell Nails 11/20/2014 11:13 AM

## 2014-11-21 LAB — GLUCOSE, CAPILLARY
GLUCOSE-CAPILLARY: 128 mg/dL — AB (ref 70–99)
GLUCOSE-CAPILLARY: 102 mg/dL — AB (ref 70–99)

## 2014-11-21 MED ORDER — POTASSIUM CHLORIDE CRYS ER 20 MEQ PO TBCR: 20.0000 meq | EXTENDED_RELEASE_TABLET | Freq: Every day | ORAL | Status: DC

## 2014-11-21 MED ORDER — LISINOPRIL 10 MG PO TABS
10.0000 mg | ORAL_TABLET | Freq: Every day | ORAL | Status: DC
  Filled 2014-11-21: qty 1

## 2014-11-21 MED ORDER — OXYCODONE HCL 5 MG PO TABS: 5.0000 mg | ORAL_TABLET | ORAL | Status: DC | PRN

## 2014-11-21 MED ORDER — LISINOPRIL 10 MG PO TABS: 10.0000 mg | ORAL_TABLET | Freq: Every day | ORAL | Status: DC

## 2014-11-21 MED ORDER — FUROSEMIDE 40 MG PO TABS: 40.0000 mg | ORAL_TABLET | Freq: Every day | ORAL | Status: DC

## 2014-11-21 MED ORDER — LISINOPRIL 5 MG PO TABS: 5.0000 mg | ORAL_TABLET | Freq: Every day | ORAL | Status: DC

## 2014-11-21 MED ORDER — ASPIRIN 325 MG PO TBEC: 325.0000 mg | DELAYED_RELEASE_TABLET | Freq: Every day | ORAL | Status: DC

## 2014-11-21 MED ORDER — ATORVASTATIN CALCIUM 80 MG PO TABS: 80.0000 mg | ORAL_TABLET | Freq: Every day | ORAL | Status: DC

## 2014-11-21 MED ORDER — LIVING WELL WITH DIABETES BOOK
Freq: Once | Status: AC
  Administered 2014-11-21: 13:00:00
  Filled 2014-11-21: qty 1

## 2014-11-21 MED ORDER — BLOOD GLUCOSE MONITOR KIT: PACK | Status: DC

## 2014-11-21 MED ORDER — METOPROLOL TARTRATE 25 MG PO TABS: 25.0000 mg | ORAL_TABLET | Freq: Two times a day (BID) | ORAL | Status: DC

## 2014-11-21 NOTE — Progress Notes (Signed)
PATIENT RESTING IN BED. INITIALLY OUT OF BED TO CHAIR AT START OF SHIFT. FATHER AT BEDSIDE. PATIENT HAS RECEIVED MULTIPLE DOSES OF OXYCODONE 10 MG  THIS SHIFT. PAIN LOCATED OVER CHEST AT INCISION AND SURROUNDING AREA.  VSS. GLUCOSE 120'S - 130'S THIS SHIFT.  PATIENT INSTRUCTED TO CALL FOR ASSISTANCE WHEN NEEDED.

## 2014-11-21 NOTE — Progress Notes (Signed)
Ed completed with pt, wife, and parents. Voiced understanding and requests his name be sent to [facility].  [id number] Ethelda Chick CES, ACSM 2:30 PM 11/21/2014

## 2014-11-21 NOTE — Progress Notes (Signed)
CARDIAC REHAB PHASE I   PRE:  Rate/Rhythm: 101 ST    BP: sitting     SaO2: 93-94 RA  MODE:  Ambulation: 790 ft   POST:  Rate/Rhythm: 114 ST    BP: sitting 120/86     SaO2: 93 RA  Tolerated well. Pt independently moving, steady. SaO2 much improved, 92-94 RA. Denied c/o. Will await wife for ed. [phone]   Harriet Masson CES, ACSM 11/21/2014 9:06 AM

## 2014-11-21 NOTE — Discharge Instructions (Signed)
Activity: 1.May walk up steps                2.No lifting more than ten pounds for four weeks.                 3.No driving for four weeks.                4.Stop any activity that causes chest pain, shortness of breath, dizziness, sweating or excessive weakness.                5.Avoid straining.                6.Continue with your breathing exercises daily.  Diet: Diabetic diet and Low fat, Low salt diet  Wound Care: May shower.  Clean wounds with mild soap and water daily. Contact the office at [phone] if any problems arise.   Coronary Artery Bypass Grafting, Care After Refer to this sheet in the next few weeks. These instructions provide you with information on caring for yourself after your procedure. Your health care provider may also give you more specific instructions. Your treatment has been planned according to current medical practices, but problems sometimes occur. Call your health care provider if you have any problems or questions after your procedure. WHAT TO EXPECT AFTER THE PROCEDURE Recovery from surgery will be different for everyone. Some people feel well after 3 or 4 weeks, while for others it takes longer. After your procedure, it is typical to have the following:  Nausea and a lack of appetite.   Constipation.  Weakness and fatigue.   Depression or irritability.   Pain or discomfort at your incision site. HOME CARE INSTRUCTIONS  Take medicines only as directed by your health care provider. Do not stop taking medicines or start any new medicines without first checking with your health care provider.  Take your pulse as directed by your health care provider.  Perform deep breathing as directed by your health care provider. If you were given a device called an incentive spirometer, use it to practice deep breathing several times a day. Support your chest with a pillow or your arms when you take deep breaths or cough.  Keep incision areas clean, dry, and  protected. Remove or change any bandages (dressings) only as directed by your health care provider. You may have skin adhesive strips over the incision areas. Do not take the strips off. They will fall off on their own.  Check incision areas daily for any swelling, redness, or drainage.  If incisions were made in your legs, do the following:  Avoid crossing your legs.   Avoid sitting for long periods of time. Change positions every 30 minutes.   Elevate your legs when you are sitting.  Wear compression stockings as directed by your health care provider. These stockings help keep blood clots from forming in your legs.  Take showers once your health care provider approves. Until then, only take sponge baths. Pat incisions dry. Do not rub incisions with a washcloth or towel. Do not take baths, swim, or use a hot tub until your health care provider approves.  Eat foods that are high in fiber, such as raw fruits and vegetables, whole grains, beans, and nuts. Meats should be lean cut. Avoid canned, processed, and fried foods.  Drink enough fluid to keep your urine clear or pale yellow.  Weigh yourself every day. This helps identify if you are retaining fluid that may make your heart and  lungs work harder.  Rest and limit activity as directed by your health care provider. You may be instructed to:  Stop any activity at once if you have chest pain, shortness of breath, irregular heartbeats, or dizziness. Get help right away if you have any of these symptoms.  Move around frequently for short periods or take short walks as directed by your health care provider. Increase your activities gradually. You may need physical therapy or cardiac rehabilitation to help strengthen your muscles and build your endurance.  Avoid lifting, pushing, or pulling anything heavier than 10 lb (4.5 kg) for at least 6 weeks after surgery.  Do not drive until your health care provider approves.  Ask your health  care provider when you may return to work.  Ask your health care provider when you may resume sexual activity.  Keep all follow-up visits as directed by your health care provider. This is important. SEEK MEDICAL CARE IF:  You have swelling, redness, increasing pain, or drainage at the site of an incision.  You have a fever.  You have swelling in your ankles or legs.  You have pain in your legs.   You gain 2 or more pounds (0.9 kg) a day.  You are nauseous or vomit.  You have diarrhea. SEEK IMMEDIATE MEDICAL CARE IF:  You have chest pain that goes to your jaw or arms.  You have shortness of breath.   You have a fast or irregular heartbeat.   You notice a "clicking" in your breastbone (sternum) when you move.   You have numbness or weakness in your arms or legs.  You feel dizzy or light-headed.  MAKE SURE YOU:  Understand these instructions.  Will watch your condition.  Will get help right away if you are not doing well or get worse. Document Released: 02/22/2005 Document Revised: 12/20/2013 Document Reviewed: 01/12/2013 [facility] Patient Information 2015 [place], [place]. This information is not intended to replace advice given to you by your health care provider. Make sure you discuss any questions you have with your health care provider.   Diabetes Mellitus and Food It is important for you to manage your blood sugar (glucose) level. Your blood glucose level can be greatly affected by what you eat. Eating healthier foods in the appropriate amounts throughout the day at about the same time each day will help you control your blood glucose level. It can also help slow or prevent worsening of your diabetes mellitus. Healthy eating may even help you improve the level of your blood pressure and reach or maintain a healthy weight.  HOW CAN FOOD AFFECT ME? Carbohydrates Carbohydrates affect your blood glucose level more than any other type of food. Your dietitian will  help you determine how many carbohydrates to eat at each meal and teach you how to count carbohydrates. Counting carbohydrates is important to keep your blood glucose at a healthy level, especially if you are using insulin or taking certain medicines for diabetes mellitus. Alcohol Alcohol can cause sudden decreases in blood glucose (hypoglycemia), especially if you use insulin or take certain medicines for diabetes mellitus. Hypoglycemia can be a life-threatening condition. Symptoms of hypoglycemia (sleepiness, dizziness, and disorientation) are similar to symptoms of having too much alcohol.  If your health care provider has given you approval to drink alcohol, do so in moderation and use the following guidelines:  Women should not have more than one drink per day, and men should not have more than two drinks per day. One drink  is equal to:  12 oz of beer.  5 oz of wine.  1 oz of hard liquor.  Do not drink on an empty stomach.  Keep yourself hydrated. Have water, diet soda, or unsweetened iced tea.  Regular soda, juice, and other mixers might contain a lot of carbohydrates and should be counted. WHAT FOODS ARE NOT RECOMMENDED? As you make food choices, it is important to remember that all foods are not the same. Some foods have fewer nutrients per serving than other foods, even though they might have the same number of calories or carbohydrates. It is difficult to get your body what it needs when you eat foods with fewer nutrients. Examples of foods that you should avoid that are high in calories and carbohydrates but low in nutrients include:  Trans fats (most processed foods list trans fats on the Nutrition Facts label).  Regular soda.  Juice.  Candy.  Sweets, such as cake, pie, doughnuts, and cookies.  Fried foods. WHAT FOODS CAN I EAT? Have nutrient-rich foods, which will nourish your body and keep you healthy. The food you should eat also will depend on several factors,  including:  The calories you need.  The medicines you take.  Your weight.  Your blood glucose level.  Your blood pressure level.  Your cholesterol level. You also should eat a variety of foods, including:  Protein, such as meat, poultry, fish, tofu, nuts, and seeds (lean animal proteins are best).  Fruits.  Vegetables.  Dairy products, such as milk, cheese, and yogurt (low fat is best).  Breads, grains, pasta, cereal, rice, and beans.  Fats such as olive oil, trans fat-free margarine, canola oil, avocado, and olives. DOES EVERYONE WITH DIABETES MELLITUS HAVE THE SAME MEAL PLAN? Because every person with diabetes mellitus is different, there is not one meal plan that works for everyone. It is very important that you meet with a dietitian who will help you create a meal plan that is just right for you. Document Released: 05/02/2005 Document Revised: 08/10/2013 Document Reviewed: 07/02/2013 [facility] Patient Information 2015 [place], [place]. This information is not intended to replace advice given to you by your health care provider. Make sure you discuss any questions you have with your health care provider.  Coronary Artery Bypass Grafting, Care After Refer to this sheet in the next few weeks. These instructions provide you with information on caring for yourself after your procedure. Your health care provider may also give you more specific instructions. Your treatment has been planned according to current medical practices, but problems sometimes occur. Call your health care provider if you have any problems or questions after your procedure. WHAT TO EXPECT AFTER THE PROCEDURE Recovery from surgery will be different for everyone. Some people feel well after 3 or 4 weeks, while for others it takes longer. After your procedure, it is typical to have the following:  Nausea and a lack of appetite.   Constipation.  Weakness and fatigue.   Depression or irritability.   Pain  or discomfort at your incision site. HOME CARE INSTRUCTIONS  Take medicines only as directed by your health care provider. Do not stop taking medicines or start any new medicines without first checking with your health care provider.  Take your pulse as directed by your health care provider.  Perform deep breathing as directed by your health care provider. If you were given a device called an incentive spirometer, use it to practice deep breathing several times a day. Support your chest  with a pillow or your arms when you take deep breaths or cough.  Keep incision areas clean, dry, and protected. Remove or change any bandages (dressings) only as directed by your health care provider. You may have skin adhesive strips over the incision areas. Do not take the strips off. They will fall off on their own.  Check incision areas daily for any swelling, redness, or drainage.  If incisions were made in your legs, do the following:  Avoid crossing your legs.   Avoid sitting for long periods of time. Change positions every 30 minutes.   Elevate your legs when you are sitting.  Wear compression stockings as directed by your health care provider. These stockings help keep blood clots from forming in your legs.  Take showers once your health care provider approves. Until then, only take sponge baths. Pat incisions dry. Do not rub incisions with a washcloth or towel. Do not take baths, swim, or use a hot tub until your health care provider approves.  Eat foods that are high in fiber, such as raw fruits and vegetables, whole grains, beans, and nuts. Meats should be lean cut. Avoid canned, processed, and fried foods.  Drink enough fluid to keep your urine clear or pale yellow.  Weigh yourself every day. This helps identify if you are retaining fluid that may make your heart and lungs work harder.  Rest and limit activity as directed by your health care provider. You may be instructed to:  Stop any  activity at once if you have chest pain, shortness of breath, irregular heartbeats, or dizziness. Get help right away if you have any of these symptoms.  Move around frequently for short periods or take short walks as directed by your health care provider. Increase your activities gradually. You may need physical therapy or cardiac rehabilitation to help strengthen your muscles and build your endurance.  Avoid lifting, pushing, or pulling anything heavier than 10 lb (4.5 kg) for at least 6 weeks after surgery.  Do not drive until your health care provider approves.  Ask your health care provider when you may return to work.  Ask your health care provider when you may resume sexual activity.  Keep all follow-up visits as directed by your health care provider. This is important. SEEK MEDICAL CARE IF:  You have swelling, redness, increasing pain, or drainage at the site of an incision.  You have a fever.  You have swelling in your ankles or legs.  You have pain in your legs.   You gain 2 or more pounds (0.9 kg) a day.  You are nauseous or vomit.  You have diarrhea. SEEK IMMEDIATE MEDICAL CARE IF:  You have chest pain that goes to your jaw or arms.  You have shortness of breath.   You have a fast or irregular heartbeat.   You notice a "clicking" in your breastbone (sternum) when you move.   You have numbness or weakness in your arms or legs.  You feel dizzy or light-headed.  MAKE SURE YOU:  Understand these instructions.  Will watch your condition.  Will get help right away if you are not doing well or get worse. Document Released: 02/22/2005 Document Revised: 12/20/2013 Document Reviewed: 01/12/2013 [facility] Patient Information 2015 [place], [place]. This information is not intended to replace advice given to you by your health care provider. Make sure you discuss any questions you have with your health care provider.  Endoscopic Saphenous Vein Harvesting Care  After Refer to this  sheet in the next few weeks. These instructions provide you with information on caring for yourself after your procedure. Your health care provider may also give you more specific instructions. Your treatment has been planned according to current medical practices, but problems sometimes occur. Call your health care provider if you have any problems or questions after your procedure. HOME CARE INSTRUCTIONS Medicine  Take whatever pain medicine your surgeon prescribes. Follow the directions carefully. Do not take over-the-counter pain medicine unless your surgeon says it is okay. Some pain medicine can cause bleeding problems for several weeks after surgery.  Follow your surgeon's instructions about driving. You will probably not be permitted to drive after heart surgery.  Take any medicines your surgeon prescribes. Any medicines you took before your heart surgery should be checked with your health care provider before you start taking them again. Wound care  If your surgeon has prescribed an elastic bandage or stocking, ask how long you should wear it.  Check the area around your surgical cuts (incisions) whenever your bandages (dressings) are changed. Look for any redness or swelling.  You will need to return to have the stitches (sutures) or staples taken out. Ask your surgeon when to do that.  Ask your surgeon when you can shower or bathe. Activity  Try to keep your legs raised when you are sitting.  Do any exercises your health care providers have given you. These may include deep breathing exercises, coughing, walking, or other exercises. SEEK MEDICAL CARE IF:  You have any questions about your medicines.  You have more leg pain, especially if your pain medicine stops working.  New or growing bruises develop on your leg.  Your leg swells, feels tight, or becomes red.  You have numbness in your leg. SEEK IMMEDIATE MEDICAL CARE IF:  Your pain gets much  worse.  Blood or fluid leaks from any of the incisions.  Your incisions become warm, swollen, or red.  You have chest pain.  You have trouble breathing.  You have a fever.  You have more pain near your leg incision. MAKE SURE YOU:  Understand these instructions.  Will watch your condition.  Will get help right away if you are not doing well or get worse. Document Released: 04/17/2011 Document Revised: 08/10/2013 Document Reviewed: 04/17/2011 [facility] Patient Information 2015 [place], [place]. This information is not intended to replace advice given to you by your health care provider. Make sure you discuss any questions you have with your health care provider.

## 2014-11-21 NOTE — Progress Notes (Addendum)
      [address]       [place] [address]             [phone]        6 Days Post-Op Procedure(s) (LRB): CORONARY ARTERY BYPASS GRAFTING (CABG), ON PUMP, TIMES THREE, USING LEFT INTERNAL MAMMARY ARTERY, RIGHT GREATER SAPHENOUS VEIN HARVESTED ENDOSCOPICALLY (N/A) TRANSESOPHAGEAL ECHOCARDIOGRAM (TEE) (N/A)  Subjective: Patient states feels "clammy at times only while in bed"  Objective: Vital signs in last 24 hours: Temp:  [97.8 F (36.6 C)-98.7 F (37.1 C)] 98.7 F (37.1 C) (04/04 0546) Pulse Rate:  [93-97] 94 (04/04 0546) Cardiac Rhythm:  [-] Normal sinus rhythm (04/03 2010) Resp:  [20] 20 (04/04 0546) BP: (122-132)/(82-83) 132/82 mmHg (04/04 0546) SpO2:  [94 %-96 %] 95 % (04/04 0546) Weight:  [268 lb 15.4 oz (122 kg)] 268 lb 15.4 oz (122 kg) (04/04 0546)  Pre op weight 122 kg Current Weight  11/21/14 268 lb 15.4 oz (122 kg)      Intake/Output from previous day: 04/03 0701 - 04/04 0700 In: 723 [P.O.:720; I.V.:3] Out: -    Physical Exam:  Cardiovascular: RRR Pulmonary: Clear to auscultation bilaterally; no rales, wheezes, or rhonchi. Abdomen: Soft, non tender, bowel sounds present. Extremities: Mild bilateral lower extremity edema. Wounds: Clean and dry.  No erythema or signs of infection.  Lab Results: CBC: Recent Labs  11/19/14 0518  WBC 8.5  HGB 12.7*  HCT 38.8*  PLT 145*   BMET:  Recent Labs  11/19/14 0518  NA 139  K 4.0  CL 106  CO2 29  GLUCOSE 134*  BUN 29*  CREATININE 0.98  CALCIUM 8.7    PT/INR:  Lab Results  Component Value Date   INR 1.26 11/15/2014   INR 1.09 11/14/2014   INR 1.07 11/13/2014   ABG:  INR: Will add last result for INR, ABG once components are confirmed Will add last 4 CBG results once components are confirmed  Assessment/Plan:  1. CV - SR in the 90's. On Lopressor 25 mg bid and Lisinopril 5 mg daily. 2.  Pulmonary - On 1 liter of oxygen via [place], but weaned to room air. Encourage  incentive spirometer 3. Volume Overload - On Lasix 40 mg daily 4.  Mild Acute blood loss anemia - Last H and H 12.7 and 38.8 5. DM-CBGs 108/132/128. Diet and exercise control for now. Patient instructed to keep a log of his sugars daily and follow up with PCP. He may need oral agent in future. 6. Will discharge later this afternoon  ZIMMERMAN,DONIELLE MPA-C 11/21/2014,7:46 AM  Patient seen and examined, agree with above He walked almost 800 feet without O2 Dc home later today  Viviann Spare C. Dorris Fetch, MD [facility] [phone]

## 2014-11-21 NOTE — Progress Notes (Signed)
Inpatient Diabetes Program Recommendations  AACE/ADA: New Consensus Statement on Inpatient Glycemic Control (2013)  Target Ranges:  Prepandial:   less than 140 mg/dL      Peak postprandial:   less than 180 mg/dL (1-2 hours)      Critically ill patients:  140 - 180 mg/dL   Pt with new onset dm with HgbA1C of 6.6% (diagnosis is 6.5 % and greater.) Would like to start teaching with patient using ed'l videos, RD consult and teaching booklet.  It appears that Metformin at 500 mg bid would be help to slow progression of disease as well as diet and exercise. Will talk with patient as well.  Thank you Lenor Coffin, RN, MSN, CDE  Diabetes Inpatient Program Office: [phone] Pager: [phone] 8:00 am to 5:00 pm

## 2014-11-22 ENCOUNTER — Other Ambulatory Visit: Payer: Self-pay | Admitting: *Deleted

## 2014-11-22 ENCOUNTER — Telehealth: Payer: Self-pay | Admitting: Cardiovascular Disease

## 2014-11-22 DIAGNOSIS — G8918 Other acute postprocedural pain: Secondary | ICD-10-CM

## 2014-11-22 MED ORDER — OXYCODONE HCL 5 MG PO TABS: 5.0000 mg | ORAL_TABLET | ORAL | Status: DC | PRN

## 2014-11-22 NOTE — Telephone Encounter (Signed)
Spoke with patient's wife and advised that Dr. Elease Hashimoto has agreed to see patient and I rescheduled post hospital appointment for 4/21 with Dr. Elease Hashimoto at the [facility] office.  I gave wife directions and phone number to [place] office and phone number for [facility].  Patient's wife verbalized understanding and agreement and thanked me for the call.

## 2014-11-22 NOTE — Telephone Encounter (Signed)
New message  Pt wife called states that she was advised per Dr.Nahser that he would pick the pt up as a patient,  if the pt desires to re- establish with him. Currently a patient of Ar[facility], requests to re-est with Dr. Elease Hashimoto

## 2014-11-25 ENCOUNTER — Telehealth: Payer: Self-pay | Admitting: *Deleted

## 2014-11-28 ENCOUNTER — Other Ambulatory Visit: Payer: Self-pay | Admitting: Thoracic Surgery (Cardiothoracic Vascular Surgery)

## 2014-11-28 DIAGNOSIS — Z951 Presence of aortocoronary bypass graft: Secondary | ICD-10-CM

## 2014-11-29 ENCOUNTER — Ambulatory Visit
Admission: RE | Admit: 2014-11-29 | Discharge: 2014-11-29 | Disposition: A | Discharge status: Home / Self Care | Payer: BC Managed Care – PPO | Source: Ambulatory Visit | Attending: Thoracic Surgery (Cardiothoracic Vascular Surgery) | Admitting: Thoracic Surgery (Cardiothoracic Vascular Surgery)

## 2014-11-29 ENCOUNTER — Encounter: Payer: Self-pay | Admitting: Thoracic Surgery (Cardiothoracic Vascular Surgery)

## 2014-11-29 ENCOUNTER — Ambulatory Visit (INDEPENDENT_AMBULATORY_CARE_PROVIDER_SITE_OTHER): Payer: Self-pay | Admitting: Thoracic Surgery (Cardiothoracic Vascular Surgery)

## 2014-11-29 VITALS — BP 99/72 | HR 81 | Temp 97.2°F | Resp 16 | Ht 71.0 in | Wt 250.0 lb

## 2014-11-29 DIAGNOSIS — R05 Cough: Secondary | ICD-10-CM

## 2014-11-29 DIAGNOSIS — I2510 Atherosclerotic heart disease of native coronary artery without angina pectoris: Secondary | ICD-10-CM

## 2014-11-29 DIAGNOSIS — Z951 Presence of aortocoronary bypass graft: Secondary | ICD-10-CM

## 2014-11-29 DIAGNOSIS — R059 Cough, unspecified: Secondary | ICD-10-CM

## 2014-11-29 MED ORDER — OXYCODONE HCL 5 MG PO TABS: 5.0000 mg | ORAL_TABLET | Freq: Four times a day (QID) | ORAL | Status: DC | PRN

## 2014-11-29 MED ORDER — HYDROCOD POLST-CHLORPHEN POLST 10-8 MG/5ML PO LQCR: 5.0000 mL | Freq: Two times a day (BID) | ORAL | Status: DC | PRN

## 2014-11-29 MED ORDER — LEVOFLOXACIN 500 MG PO TABS: 500.0000 mg | ORAL_TABLET | Freq: Every day | ORAL | Status: DC

## 2014-11-29 NOTE — Progress Notes (Signed)
[address]       [place] [address]             [phone]       HPI: [name] returns today with a complaint of cough and a sore throat  [name] a 54 year old gentleman who underwent emergency coronary bypass grafting 3 on March 29 for a ST elevation MI. His postoperative course was uncomplicated and he was discharged on day 5.  Last week he developed a cough. This is been frequent. It is sometimes productive of yellow sputum. He also complains of a sore throat. He denies any fevers but has felt cold and clammy at times. He also complains of a shooting pain in his right thigh that sometimes it wakes him up at night. His appetite is fair. He has had difficulty sleeping.  Past Medical History  Diagnosis Date  . Dyslipidemia (high LDL; low HDL)   . Family history of premature CAD       Current Outpatient Prescriptions  Medication Sig Dispense Refill  . acetaminophen (TYLENOL) 500 MG tablet Take 500 mg by mouth every 6 (six) hours as needed.    Marland Kitchen aspirin EC 325 MG EC tablet Take 1 tablet (325 mg total) by mouth daily. 30 tablet 0  . Aspirin-Salicylamide-Caffeine (BC HEADACHE POWDER PO) Take 1 Package by mouth daily as needed (headache).    Marland Kitchen atorvastatin (LIPITOR) 80 MG tablet Take 1 tablet (80 mg total) by mouth daily at 6 PM. 30 tablet 1  . blood glucose meter kit and supplies KIT Dispense based on patient and insurance preference. Use up to four times daily as directed. (FOR ICD-9 250.00, 250.01). 1 each 0  . lisinopril (PRINIVIL,ZESTRIL) 10 MG tablet Take 1 tablet (10 mg total) by mouth daily. 30 tablet 1  . metoprolol tartrate (LOPRESSOR) 25 MG tablet Take 1 tablet (25 mg total) by mouth 2 (two) times daily. 60 tablet 1  . chlorpheniramine-HYDROcodone (TUSSIONEX) 10-8 MG/5ML LQCR Take 5 mLs by mouth every 12 (twelve) hours as needed for cough. 100 mL 0  . levofloxacin (LEVAQUIN) 500 MG tablet Take 1 tablet (500 mg total) by mouth daily. 7 tablet  0  . MELATONIN PO Take 1 tablet by mouth daily as needed (sleep).    Marland Kitchen oxyCODONE (OXY IR/ROXICODONE) 5 MG immediate release tablet Take 1-2 tablets (5-10 mg total) by mouth every 4 (four) hours as needed for severe pain. 50 tablet 0  . oxyCODONE (OXY IR/ROXICODONE) 5 MG immediate release tablet Take 1 tablet (5 mg total) by mouth 4 (four) times daily as needed for severe pain. 30 tablet 0   No current facility-administered medications for this visit.    Physical Exam BP 99/72 mmHg  Pulse 81  Temp(Src) 97.2 F (36.2 C) (Oral)  Resp 16  Ht _0  (1.803 m)  Wt 250 lb (113.399 kg)  BMI 34.88 kg/m2  SpO33 91% 54 year old man in no acute distress Alert and oriented 3 with no neurologic deficit Sternum stable, incision clean dry and intact Cardiac regular rate and rhythm normal S1 and S2 Lungs no wheezing Leg incision healing well  Diagnostic Tests: Chest x-ray was reviewed it shows postoperative changes. There is no definite pneumonia. There is no clinically significant pleural effusion.  Impression: 54 year old man who is now about 2 weeks out from emergency coronary bypass grafting 3. He complains of productive cough, general malaise, and a sore throat. There is no evidence of pneumonia but he  may well have bronchitis. I am going to treat him empirically with Levaquin 500 mg by mouth daily for a week. He has been hospitalized recently saw think Levaquin is more appropriate than azithromycin.  I also gave him a prescription for Tussionex cough. He may wish to reserve that for when his cough is severe and use over-the-counter cough medication for times when it's not as bad.  He has been taking oxycodone 1-2 tablets about every 4 hours while awake. His most recent prescription is running low. I gave him an additional prescription for 30 tablets to use up to 4 times daily as needed for pain. He may use Tylenol in addition.  Plan: I will plan to see him back in a scheduled office visit  in 2 weeks and we will repeat his chest x-ray at that time.  Melrose Nakayama, MD [facility] [phone]

## 2014-12-06 ENCOUNTER — Encounter: Payer: BC Managed Care – PPO | Admitting: Cardiovascular Disease

## 2014-12-08 ENCOUNTER — Encounter: Payer: Self-pay | Admitting: Cardiovascular Disease

## 2014-12-08 ENCOUNTER — Ambulatory Visit (INDEPENDENT_AMBULATORY_CARE_PROVIDER_SITE_OTHER): Payer: BC Managed Care – PPO | Admitting: Cardiovascular Disease

## 2014-12-08 ENCOUNTER — Other Ambulatory Visit: Payer: Self-pay | Admitting: Thoracic Surgery (Cardiothoracic Vascular Surgery)

## 2014-12-08 VITALS — BP 100/70 | HR 70 | Ht 71.0 in | Wt 245.0 lb

## 2014-12-08 DIAGNOSIS — E784 Other hyperlipidemia: Secondary | ICD-10-CM | POA: Diagnosis not present

## 2014-12-08 DIAGNOSIS — Z951 Presence of aortocoronary bypass graft: Secondary | ICD-10-CM | POA: Diagnosis not present

## 2014-12-08 DIAGNOSIS — I25810 Atherosclerosis of coronary artery bypass graft(s) without angina pectoris: Secondary | ICD-10-CM | POA: Diagnosis not present

## 2014-12-08 DIAGNOSIS — I2102 ST elevation (STEMI) myocardial infarction involving left anterior descending coronary artery: Secondary | ICD-10-CM

## 2014-12-08 DIAGNOSIS — E785 Hyperlipidemia, unspecified: Secondary | ICD-10-CM

## 2014-12-08 NOTE — Progress Notes (Signed)
Cardiology Office Note   Date:  12/08/2014   ID:  Seth Riddle, DOB [birth year], MRN [id number]  PCP:  Rozanna Box, MD  Cardiologist:   Vesta Mixer, MD   Chief Complaint  Patient presents with  . Coronary Artery Disease    F/u from [facility] due to CABG. Meds reviewed verbally with pt.      History of Present Illness: Seth Riddle is a 54 y.o. male who presents for follow-up for his recent hospitalization for coronary artery disease, myocardial infarction and subsequent coronary artery bypass grafting. His admitted with symptoms of unstable angina. Cardiac catheterization the following day revealed significant three-vessel coronary artery disease.  The left main coronary artery was angiographically normal and bifurcated into the LAD and left circumflex coronary artery.  he LAD was a large caliber vessel and immmediately gave rise to a very high diagonal (ramus intermediate like vessel). There was diffuse 95% stenosis in the LAD in the region of this diagonal takeoff with 90-95% stenosis diffusely just beyond the origin of this ramus like vessel and 90% diffuse stenosis at the origin of the ramus like high diagonal vessel almost immediately after the left main. The left circumflex coronary artery was a moderate size vessel that gave rise to one major marginal branch. There was 95% focal stenosis just beyond an ectatic segment in the proximal obtuse marginal branch and there was diffuse distal 90% marginal stenosis. The RCA was angiographically normal , dominant vessel that gave rise to a large PDA and PLA vessel.  Left ventriculography revealed normal global LV contractility without focal segmental wall motion abnormalities. There was no evidence for mitral regurgitation. Ejection fraction is 55-60%.    Unfortunately, the night after his cardiac catheterization he occluded his left anterior descending artery and had ongoing chest pain. He returned to the Cath Lab the  following morning an emergency balloon angioplasty  was performed to open his LAD.    He was taken to emergent coronary artery bypass grafting at that time. Had a relatively slow recovery but has done fairly well. Echo done prior to DC revealed mild LV dysfunction:  Left ventricle: The cavity size was normal. Wall thickness was increased in a pattern of mild LVH. Systolic function was mildly reduced. The estimated ejection fraction was in the range of 45% to 50%. Diffuse hypokinesis. Doppler parameters are consistent with abnormal left ventricular relaxation (grade 1 diastolic dysfunction). - Right ventricle: The cavity size was mildly dilated. - Right atrium: The atrium was mildly dilated.  Impressions:  - Mild global reduction in LV function (EF 50); grade 1 diastolic dysfunction; mild RAE/RVE.    CABG  Left internal mammary artery to left anterior descending Saphenous vein graft to first diagonal Saphenous vein graft to first obtuse marginal   He has had significant right leg pain since the initial cath. He's had back surgery and the pain feels like a slipped disc.     Past Medical History  Diagnosis Date  . Dyslipidemia (high LDL; low HDL)   . Family history of premature CAD   . Coronary artery disease   . Low HDL (under 40)   . Diabetes mellitus without complication   . MI (myocardial infarction)     Past Surgical History  Procedure Laterality Date  . Left heart catheterization with coronary angiogram N/A 11/14/2014    Procedure: LEFT HEART CATHETERIZATION WITH CORONARY ANGIOGRAM;  Surgeon: Lennette Bihari, MD;  Location: [facility] CATH LAB;  Service: Cardiovascular;  Laterality: N/A;  . Intra-aortic balloon pump insertion N/A 11/15/2014    Procedure: INTRA-AORTIC BALLOON PUMP INSERTION;  Surgeon: Troy Sine, MD;  Location: [facility] CATH LAB;  Service: Cardiovascular;  Laterality: N/A;  . Percutaneous coronary stent intervention (pci-s) N/A  11/15/2014    Procedure: PERCUTANEOUS CORONARY STENT INTERVENTION (PCI-S);  Surgeon: Troy Sine, MD;  Location: [facility] CATH LAB;  Service: Cardiovascular;  Laterality: N/A;  . Coronary artery bypass graft N/A 11/15/2014    Procedure: CORONARY ARTERY BYPASS GRAFTING (CABG), ON PUMP, TIMES THREE, USING LEFT INTERNAL MAMMARY ARTERY, RIGHT GREATER SAPHENOUS VEIN HARVESTED ENDOSCOPICALLY;  Surgeon: Melrose Nakayama, MD;  Location: [place];  Service: Open Heart Surgery;  Laterality: N/A;  . Tee without cardioversion N/A 11/15/2014    Procedure: TRANSESOPHAGEAL ECHOCARDIOGRAM (TEE);  Surgeon: Melrose Nakayama, MD;  Location: [place];  Service: Open Heart Surgery;  Laterality: N/A;     Current Outpatient Prescriptions  Medication Sig Dispense Refill  . acetaminophen (TYLENOL) 500 MG tablet Take 500 mg by mouth every 6 (six) hours as needed.    Marland Kitchen aspirin EC 325 MG EC tablet Take 1 tablet (325 mg total) by mouth daily. 30 tablet 0  . atorvastatin (LIPITOR) 80 MG tablet Take 1 tablet (80 mg total) by mouth daily at 6 PM. 30 tablet 1  . blood glucose meter kit and supplies KIT Dispense based on patient and insurance preference. Use up to four times daily as directed. (FOR ICD-9 250.00, 250.01). 1 each 0  . chlorpheniramine-HYDROcodone (TUSSIONEX) 10-8 MG/5ML LQCR Take 5 mLs by mouth every 12 (twelve) hours as needed for cough. 100 mL 0  . lisinopril (PRINIVIL,ZESTRIL) 10 MG tablet Take 1 tablet (10 mg total) by mouth daily. 30 tablet 1  . MELATONIN PO Take 1 tablet by mouth daily as needed (sleep).    . metoprolol tartrate (LOPRESSOR) 25 MG tablet Take 1 tablet (25 mg total) by mouth 2 (two) times daily. 60 tablet 1  . oxyCODONE (OXY IR/ROXICODONE) 5 MG immediate release tablet Take 1 tablet (5 mg total) by mouth 4 (four) times daily as needed for severe pain. 30 tablet 0   No current facility-administered medications for this visit.    Allergies:   Coconut oil    Social History:  The patient   reports that he has never smoked. He has never used smokeless tobacco. He reports that he does not drink alcohol or use illicit drugs.   Family History:  The patient's family history includes Heart attack in his father; Heart disease in his mother; Stroke in his father and mother.    ROS:  Please see the history of present illness.    Review of Systems: Constitutional:  denies fever, chills, diaphoresis, appetite change and fatigue.  HEENT: denies photophobia, eye pain, redness, hearing loss, ear pain, congestion, sore throat, rhinorrhea, sneezing, neck pain, neck stiffness and tinnitus.  Respiratory: denies SOB, DOE, cough, chest tightness, and wheezing.  Cardiovascular: denies chest pain, palpitations and leg swelling.  Gastrointestinal: denies nausea, vomiting, abdominal pain, diarrhea, constipation, blood in stool.  Genitourinary: denies dysuria, urgency, frequency, hematuria, flank pain and difficulty urinating.  Musculoskeletal: admits to  myalgias, back pain / significant right leg pain   Skin: denies pallor, rash and wound.  Neurological: denies dizziness, seizures, syncope, weakness, light-headedness, numbness and headaches.   Hematological: denies adenopathy, easy bruising, personal or family bleeding history.  Psychiatric/ Behavioral: denies suicidal ideation, mood changes, confusion, nervousness, sleep disturbance and agitation.  All other systems are reviewed and negative.    PHYSICAL EXAM: VS:  Ht $R'5\' 11"'VS$  (1.803 m)  Wt 245 lb (111.131 kg)  BMI 34.19 kg/m2 , BMI Body mass index is 34.19 kg/(m^2). GEN: Well nourished, well developed, in no acute distress HEENT: normal Neck: no JVD, carotid bruits, or masses Cardiac: RRR; no murmurs, rubs, or gallops,no edema . His sternotomy scar is healing well.  The thoracostomy sites are healing well. Respiratory:  clear to auscultation bilaterally, normal work of breathing.  GI: soft, nontender, nondistended, + BS MS: no  deformity or atrophyThe saphenous vein harvest sites are healing well. Skin: warm and dry, no rash Neuro:  Strength and sensation are intact Psych: normal   EKG:  EKG is ordered today. The ekg ordered today demonstrates NSR at 70,  Previous anterior MI.    Recent Labs: 11/16/2014: Magnesium 2.0 11/18/2014: ALT 39 11/19/2014: BUN 29*; Creatinine 0.98; Hemoglobin 12.7*; Platelets 145*; Potassium 4.0; Sodium 139    Lipid Panel    Component Value Date/Time   CHOL 174 11/14/2014 0350   TRIG 168* 11/14/2014 0350   HDL 24* 11/14/2014 0350   CHOLHDL 7.3 11/14/2014 0350   VLDL 34 11/14/2014 0350   LDLCALC 116* 11/14/2014 0350      Wt Readings from Last 3 Encounters:  12/08/14 245 lb (111.131 kg)  11/29/14 250 lb (113.399 kg)  11/21/14 268 lb 15.4 oz (122 kg)      Other studies Reviewed: Additional studies/ records that were reviewed today include: . Review of the above records demonstrates:    ASSESSMENT AND PLAN:  1.  Coronary artery disease: He status post coronary artery bypass grafting. -LIMA to LAD -SVG to DIAGONAL 1 -SVG to OM1  He's not having any episodes of angina. We will continue the current dose of aspirin. I anticipate decreasing his aspirin to 81 mg a day later this summer.  I'll see him again in one month.  2. Chronic systolic congestive heart failure: The patient had an acute anterior wall myocardial infarction resulting in decreased LV function. We do not have an echo card gram since his myocardial infarction. We'll repeat his echo today. He'll continue with the metoprolol and lisinopril for now. I anticipate repeating his echocardiogram in approximately 3 months. We may need to consider ICD if his LV function is less than 35%.  3. Hyperlipidemia: Continue atorvastatin and will also refer him to a nutritionist. - Amy Hager      Current medicines are reviewed at length with the patient today.  The patient does not have concerns regarding medicines.  The  following changes have been made:  no change  Labs/ tests ordered today include:  Orders Placed This Encounter  Procedures  . EKG 12-Lead     Disposition:   FU with me in 1 month.    Signed, Amaria Mundorf, Wonda Cheng, MD  12/08/2014 1:51 PM    [facility] [address], [place], [place]  [address] Phone: [phone]; Fax: [phone]

## 2014-12-08 NOTE — Patient Instructions (Signed)
Medication Instructions: - no changes  Labwork: - none  Procedures/Testing: - Your physician has requested that you have an echocardiogram. Echocardiography is a painless test that uses sound waves to create images of your heart. It provides your doctor with information about the size and shape of your heart and how well your heart's chambers and valves are working. This procedure takes approximately one hour. There are no restrictions for this procedure.  Follow-Up: - 1 month with Dr. Elease Hashimoto  Any Additional Special Instructions Will Be Listed Below (If Applicable). - none

## 2014-12-12 ENCOUNTER — Encounter: Payer: BC Managed Care – PPO | Admitting: Cardiovascular Disease

## 2014-12-12 ENCOUNTER — Telehealth: Payer: Self-pay

## 2014-12-12 NOTE — Telephone Encounter (Signed)
[facility] cardiac rehab order and paperwork was faxed today. Attempted to fax information this morning and fax did not go through. Re-sent this afternoon and confirmation received this went through. I called and spoke with the patient's wife to make her aware.

## 2014-12-12 NOTE — Telephone Encounter (Signed)
Pt wife called, would like to know if referral has been made for cardiac rehab. Please advise

## 2014-12-13 ENCOUNTER — Ambulatory Visit (INDEPENDENT_AMBULATORY_CARE_PROVIDER_SITE_OTHER): Payer: Self-pay | Admitting: Thoracic Surgery (Cardiothoracic Vascular Surgery)

## 2014-12-13 ENCOUNTER — Encounter: Payer: Self-pay | Admitting: Thoracic Surgery (Cardiothoracic Vascular Surgery)

## 2014-12-13 ENCOUNTER — Ambulatory Visit
Admission: RE | Admit: 2014-12-13 | Discharge: 2014-12-13 | Disposition: A | Discharge status: Home / Self Care | Payer: BC Managed Care – PPO | Source: Ambulatory Visit | Attending: Thoracic Surgery (Cardiothoracic Vascular Surgery) | Admitting: Thoracic Surgery (Cardiothoracic Vascular Surgery)

## 2014-12-13 VITALS — BP 92/65 | HR 56 | Resp 20 | Ht 71.0 in | Wt 245.0 lb

## 2014-12-13 DIAGNOSIS — Z951 Presence of aortocoronary bypass graft: Secondary | ICD-10-CM

## 2014-12-13 DIAGNOSIS — I2510 Atherosclerotic heart disease of native coronary artery without angina pectoris: Secondary | ICD-10-CM

## 2014-12-13 MED ORDER — GABAPENTIN 300 MG PO CAPS: 300.0000 mg | ORAL_CAPSULE | Freq: Three times a day (TID) | ORAL | Status: DC

## 2014-12-13 NOTE — Progress Notes (Signed)
[address]       [place] [address]             [phone]       HPI:  Mr. Griep returns today for a scheduled postoperative follow-up visit.  He is a 54 year old gentleman who presented with unstable chest pain back in March. He had severe two-vessel disease catheterization scheduled for elective bypass surgery. On the night prior to surgery he had recurrent chest pain and thrombosed his LAD despite heparin drip. This was not recognized at the time and he had an ST elevation MI. He was seen by Dr. [place] the following morning and Dr. Tresa Endo took him emergently to the catheterization laboratory. He was able to reestablish flow in the LAD and place a balloon pump. He then was taken to the operating room for emergent bypass grafting.  I saw him in the office a couple of weeks ago. He was complaining of frequent cough at that time. I treated him with a 5 day course of Levaquin and his cough resolved.  He says he is feeling well. He is starting to get more energy. He is walking for 15 minutes at a time 3 times a day. He's not having any pain in his chest, but is using oxycodone for pain in his right thigh. He has a history of back surgery several years ago and had some similar pains then. He describes the pain as a burning and tingling sensation in the anterior part of his right thigh. He does not have any back pain. He says that this pain started after he had his initial catheterization, which is interesting because that was done from a radial approach. I do not know whether or not they may have also anesthetized the right groin when they did his initial catheterization.  Past Medical History  Diagnosis Date  . Dyslipidemia (high LDL; low HDL)   . Family history of premature CAD   . Coronary artery disease   . Low HDL (under 40)   . Diabetes mellitus without complication   . MI (myocardial infarction)      Current Outpatient Prescriptions  Medication Sig  Dispense Refill  . acetaminophen (TYLENOL) 500 MG tablet Take 500 mg by mouth every 6 (six) hours as needed.    Marland Kitchen aspirin EC 325 MG EC tablet Take 1 tablet (325 mg total) by mouth daily. 30 tablet 0  . atorvastatin (LIPITOR) 80 MG tablet Take 1 tablet (80 mg total) by mouth daily at 6 PM. 30 tablet 1  . blood glucose meter kit and supplies KIT Dispense based on patient and insurance preference. Use up to four times daily as directed. (FOR ICD-9 250.00, 250.01). 1 each 0  . lisinopril (PRINIVIL,ZESTRIL) 10 MG tablet Take 1 tablet (10 mg total) by mouth daily. 30 tablet 1  . MELATONIN PO Take 1 tablet by mouth daily as needed (sleep).    . metoprolol tartrate (LOPRESSOR) 25 MG tablet Take 1 tablet (25 mg total) by mouth 2 (two) times daily. 60 tablet 1  . oxyCODONE (OXY IR/ROXICODONE) 5 MG immediate release tablet Take 1 tablet (5 mg total) by mouth 4 (four) times daily as needed for severe pain. 30 tablet 0  . gabapentin (NEURONTIN) 300 MG capsule Take 1 capsule (300 mg total) by mouth 3 (three) times daily. 90 capsule 1   No current facility-administered medications for this visit.    Physical Exam BP 92/65 mmHg  Pulse 56  Resp 20  Ht $R'5\' 11"'wT$  (1.803 m)  Wt 245 lb (111.131 kg)  BMI 34.19 kg/m2  SpO63 41% 54 year old man in no acute distress Alert and oriented 3 with no focal deficits Sternal incision clean dry and intact, sternum stable Cardiac regular rate and rhythm normal S1 and S2 no rub or murmur Lungs clear with equal breath sound bilaterally Right groin- no hematoma or bruit Leg wound healing well, no peripheral edema  Diagnostic Tests: Chest x-ray was reviewed and shows good aeration bilaterally with no effusions or infiltrates  Impression: 54 year old gentleman who is now about a month out from emergency coronary bypass grafting for an ST elevation MI. He is doing very well at this point in time.   He has minimal incisional pain but is complaining of what sounds like  neurogenic pain related to the femoral nerve. He does have a history of back problems so that could be related to that or could be related to direct stick of the femoral nerve at the time of catheterization. He doesn't have any physical signs consistent with a pseudoaneurysm or AV fistula in the groin. I gave him a prescription for Neurontin 300 mg by mouth 3 times daily to try for a month to see if that helps. I offered to set him up to see a neurologist, but he wants to give this a couple more weeks to see if it resolves on its own.  His exercise tolerance is good. I recommended we go ahead and start cardiac rehabilitation. I did advise him to gradually build into new activities. He's not to lift anything over 10 pounds for 2 weeks or 20 pounds for 4 weeks.  He is already back at work. He is self-employed. He is working up to 10 hours a day but is taking frequent rest breaks.  He may begin driving, appropriate precautions were discussed.  Dr. Acie Fredrickson has scheduled him for an echocardiogram but that has not been done yet.  The importance of diet, exercise, and medical therapy were emphasized.  Plan: He will follow-up with Dr. Grayland Jack for his cardiology care.  I will be happy to see him back any time if I can be of any further assistance with his care.  Melrose Nakayama, MD [facility] [phone]

## 2014-12-20 ENCOUNTER — Other Ambulatory Visit: Payer: Self-pay | Admitting: Cardiovascular Disease

## 2014-12-20 ENCOUNTER — Ambulatory Visit: Payer: BC Managed Care – PPO | Admitting: Thoracic Surgery (Cardiothoracic Vascular Surgery)

## 2014-12-20 DIAGNOSIS — I2510 Atherosclerotic heart disease of native coronary artery without angina pectoris: Secondary | ICD-10-CM

## 2014-12-22 ENCOUNTER — Encounter: Payer: BC Managed Care – PPO | Attending: Cardiovascular Disease | Admitting: *Deleted

## 2014-12-22 ENCOUNTER — Encounter: Payer: Self-pay | Admitting: *Deleted

## 2014-12-22 DIAGNOSIS — Z951 Presence of aortocoronary bypass graft: Secondary | ICD-10-CM | POA: Insufficient documentation

## 2014-12-22 DIAGNOSIS — I213 ST elevation (STEMI) myocardial infarction of unspecified site: Secondary | ICD-10-CM | POA: Insufficient documentation

## 2014-12-22 NOTE — Progress Notes (Deleted)
Cardiac Individual Treatment Plan  Patient Details  Name: Seth Riddle MRN: [id number] Date of Birth: [birth year] Referring Provider:  Thayer Headings, MD  Initial Encounter Date:    Patient's Home Medications on Admission:  Current outpatient prescriptions:  .  acetaminophen (TYLENOL) 500 MG tablet, Take 500 mg by mouth every 6 (six) hours as needed., Disp: , Rfl:  .  aspirin EC 325 MG EC tablet, Take 1 tablet (325 mg total) by mouth daily., Disp: 30 tablet, Rfl: 0 .  atorvastatin (LIPITOR) 80 MG tablet, Take 1 tablet (80 mg total) by mouth daily at 6 PM., Disp: 30 tablet, Rfl: 1 .  blood glucose meter kit and supplies KIT, Dispense based on patient and insurance preference. Use up to four times daily as directed. (FOR ICD-9 250.00, 250.01)., Disp: 1 each, Rfl: 0 .  gabapentin (NEURONTIN) 300 MG capsule, Take 1 capsule (300 mg total) by mouth 3 (three) times daily., Disp: 90 capsule, Rfl: 1 .  lisinopril (PRINIVIL,ZESTRIL) 10 MG tablet, Take 1 tablet (10 mg total) by mouth daily., Disp: 30 tablet, Rfl: 1 .  MELATONIN PO, Take 1 tablet by mouth daily as needed (sleep)., Disp: , Rfl:  .  metoprolol tartrate (LOPRESSOR) 25 MG tablet, Take 1 tablet (25 mg total) by mouth 2 (two) times daily., Disp: 60 tablet, Rfl: 1 .  oxyCODONE (OXY IR/ROXICODONE) 5 MG immediate release tablet, Take 1 tablet (5 mg total) by mouth 4 (four) times daily as needed for severe pain., Disp: 30 tablet, Rfl: 0  Past Medical History: Past Medical History  Diagnosis Date  . Dyslipidemia (high LDL; low HDL)   . Family history of premature CAD   . Coronary artery disease   . Low HDL (under 40)   . Diabetes mellitus without complication   . MI (myocardial infarction)     Tobacco Use: History  Smoking status  . Never Smoker   Smokeless tobacco  . Never Used    Labs: Recent Review Flowsheet Data    Labs for ITP Cardiac and Pulmonary Rehab Latest Ref Rng 11/16/2014 11/16/2014 11/16/2014 11/16/2014 11/16/2014    PHART 7.350 - 7.450 7.361 7.375 7.312(L) 7.379 -   PCO2ART 35.0 - 45.0 mmHg 40.0 38.3 41.4 38.7 -   HCO3 20.0 - 24.0 mEq/L 22.3 22.1 20.8 22.8 -   TCO2 0 - 100 mmol/L $RemoveBe'23 23 22 24 22   'HnLXkNHei$ ACIDBASEDEF 0.0 - 2.0 mmol/L 2.0 2.0 5.0(H) 2.0 -   O2SAT - 97.0 93.0 91.0 90.0 -       Exercise Target Goals:    Exercise Program Goal: Individual exercise prescription set with THRR, safety & activity barriers. Participant demonstrates ability to understand and report RPE using BORG scale, to self-measure pulse accurately, and to acknowledge the importance of the exercise prescription.  Exercise Prescription Goal: Starting with aerobic activity 30 plus minutes a day, 3 days per week for initial exercise prescription. Provide home exercise prescription and guidelines that participant acknowledges understanding prior to discharge.  Activity Barriers & Risk Stratification:     Activity Barriers & Risk Stratification - 12/22/14 1728    Activity Barriers & Risk Stratification   Activity Barriers None   Risk Stratification High      6 Minute Walk:     6 Minute Walk      12/22/14 1535       6 Minute Walk   Distance 1680 feet     Walk Time 6 minutes     Resting HR 66  bpm     Resting BP 124/64 mmHg     Max Ex. HR 87 bpm     Max Ex. BP 132/74 mmHg     RPE 11     Perceived Dyspnea  1        Initial Exercise Prescription:     Initial Exercise Prescription - 12/22/14 1500    Treadmill   MPH 2.5   Grade 0   Minutes 15   Bike   Level 1   Minutes 10   Recumbant Bike   Level 2   Watts 50   Minutes 10   NuStep   Level 3   Watts 50   Minutes 10   Arm Ergometer   Level 1   Watts 10   Minutes 10   Arm/Foot Ergometer   Level 1   Watts 12   Minutes 10   Cybex   Level 1   RPM 40   Minutes 10   Recumbant Elliptical   Level 1   RPM 50   Watts 30   Minutes 10   Elliptical   Level 1   Speed 3   Minutes 2   REL-XR   Level 3   Watts 50   Minutes 10   Prescription  Details   Frequency (times per week) 3   Duration Progress to 30 minutes of continuous aerobic without signs/symptoms of physical distress   Intensity   THRR REST +  30   Ratings of Perceived Exertion 11-15   Perceived Dyspnea 2-4   Progression Continue progressive overload as per policy without signs/symptoms or physical distress.   Resistance Training   Training Prescription Yes   Weight 2   Reps 10-12      Exercise Prescription Changes:   Discharge Exercise Prescription:   Nutrition:  Target Goals: Understanding of nutrition guidelines, daily intake of sodium '1500mg'$ , cholesterol '200mg'$ , calories 30% from fat and 7% or less from saturated fats, daily to have 5 or more servings of fruits and vegetables.  Biometrics:    Nutrition Therapy Plan and Nutrition Goals:     Nutrition Therapy & Goals - 12/22/14 1732    Intervention Plan   Intervention Using nutrition plan and personal goals to gain a healthy nutrition lifestyle. Add exercise as prescribed.      Nutrition Discharge: Rate Your Plate Scores:   Nutrition Goals Re-Evaluation:   Psychosocial: Target Goals: Acknowledge presence or absence of depression, maximize coping skills, provide positive support system. Participant is able to verbalize types and ability to use techniques and skills needed for reducing stress and depression.  Initial Review & Psychosocial Screening:     Initial Psych Review & Screening - 12/22/14 [place]? Yes   Barriers   Psychosocial barriers to participate in program There are no identifiable barriers or psychosocial needs.   Screening Interventions   Interventions Encouraged to exercise      Quality of Life Scores:     Quality of Life - 12/22/14 1734    Quality of Life Scores   Health/Function Pre 22.25 %   Socioeconomic Pre 23.86 %   Psych/Spiritual Pre 23.5 %   Family Pre 27 %   GLOBAL Pre 23.58 %      PHQ-9:     Recent  Review Flowsheet Data    Depression screen [facility] 2/9 12/22/2014   Decreased Interest 0   Down, Depressed, Hopeless 0   PHQ - 2  Score 0   Altered sleeping 2   Tired, decreased energy 0   Change in appetite 0   Feeling bad or failure about yourself  0   Trouble concentrating 0   Moving slowly or fidgety/restless 0   Suicidal thoughts 0   PHQ-9 Score 2      Psychosocial Evaluation and Intervention:     Psychosocial Evaluation - 12/22/14 1734    Psychosocial Evaluation & Interventions   Interventions Encouraged to exercise with the program and follow exercise prescription      Psychosocial Re-Evaluation:   Vocational Rehabilitation: Provide vocational rehab assistance to qualifying candidates.   Vocational Rehab Evaluation & Intervention:     Vocational Rehab - 12/22/14 1728    Initial Vocational Rehab Evaluation & Intervention   Assessment shows need for Vocational Rehabilitation No      Education: Education Goals: Education classes will be provided on a weekly basis, covering required topics. Participant will state understanding/return demonstration of topics presented.  Learning Barriers/Preferences:     Learning Barriers/Preferences - 12/22/14 1728    Learning Barriers/Preferences   Learning Barriers None   Learning Preferences None      Education Topics: General Nutrition Guidelines/Fats and Fiber: -Group instruction provided by verbal, written material, models and posters to present the general guidelines for heart healthy nutrition. Gives an explanation and review of dietary fats and fiber.   Controlling Sodium/Reading Food Labels: -Group verbal and written material supporting the discussion of sodium use in heart healthy nutrition. Review and explanation with models, verbal and written materials for utilization of the food label.   Exercise Physiology & Risk Factors: - Group verbal and written instruction with models to review the exercise physiology of  the cardiovascular system and associated critical values. Details cardiovascular disease risk factors and the goals associated with each risk factor.   Aerobic Exercise & Resistance Training: - Gives group verbal and written discussion on the health impact of inactivity. On the components of aerobic and resistive training programs and the benefits of this training and how to safely progress through these programs.   Flexibility, Balance, General Exercise Guidelines: - Provides group verbal and written instruction on the benefits of flexibility and balance training programs. Provides general exercise guidelines with specific guidelines to those with heart or lung disease. Demonstration and skill practice provided.   Stress Management: - Provides group verbal and written instruction about the health risks of elevated stress, cause of high stress, and healthy ways to reduce stress.   Depression: - Provides group verbal and written instruction on the correlation between heart/lung disease and depressed mood, treatment options, and the stigmas associated with seeking treatment.   Anatomy & Physiology of the Heart: - Group verbal and written instruction and models provide basic cardiac anatomy and physiology, with the coronary electrical and arterial systems. Review of: AMI, Angina, Valve disease, Heart Failure, Cardiac Arrhythmia, Pacemakers, and the ICD.   Cardiac Procedures: - Group verbal and written instruction and models to describe the testing methods done to diagnose heart disease. Reviews the outcomes of the test results. Describes the treatment choices: Medical Management, Angioplasty, or Coronary Bypass Surgery.   Cardiac Medications: - Group verbal and written instruction to review commonly prescribed medications for heart disease. Reviews the medication, class of the drug, and side effects. Includes the steps to properly store meds and maintain the prescription regimen.   Go  Sex-Intimacy & Heart Disease, Get SMART - Goal Setting: - Group verbal and written instruction through game  format to discuss heart disease and the return to sexual intimacy. Provides group verbal and written material to discuss and apply goal setting through the application of the S.M.A.R.T. Method.   Other Matters of the Heart: - Provides group verbal, written materials and models to describe Heart Failure, Angina, Valve Disease, and Diabetes in the realm of heart disease. Includes description of the disease process and treatment options available to the cardiac patient.   Exercise & Equipment Safety: - Individual verbal instruction and demonstration of equipment use and safety with use of the equipment.          Cardiac Rehab from 12/22/2014 in [facility] Cardiac Rehab   Date  12/22/14   Educator  C. Askari Kinley   Instruction Review Code  1- partially meets, needs review/practice      Infection Prevention: - Provides verbal and written material to individual with discussion of infection control including proper hand washing and proper equipment cleaning during exercise session.      Cardiac Rehab from 12/22/2014 in [facility] Cardiac Rehab   Date  12/22/14   Educator  C.Manjot Beumer, RN   Instruction Review Code  2- meets goals/outcomes      Falls Prevention: - Provides verbal and written material to individual with discussion of falls prevention and safety.      Cardiac Rehab from 12/22/2014 in [facility] Cardiac Rehab   Date  12/22/14   Educator  C.Ariyona Eid   Instruction Review Code  2- meets goals/outcomes      Diabetes: - Individual verbal and written instruction to review signs/symptoms of diabetes, desired ranges of glucose level fasting, after meals and with exercise. Advice that pre and post exercise glucose checks will be done for 3 sessions at entry of program.    Knowledge Questionnaire Score:   Personal Goals and Risk Factors at Admission:     Personal Goals and Risk Factors at  Admission - 12/22/14 1732    Personal Goals and Risk Factors on Admission    Weight Management Yes   Intervention Learn and follow the exercise and diet guidelines while in the program. Utilize the nutrition and education classes to help gain knowledge of the diet and exercise expectations in the program   Increase Aerobic Exercise and Physical Activity Yes   Intervention While in program, learn and follow the exercise prescription taught. Start at a low level workload and increase workload after able to maintain previous level for 30 minutes. Increase time before increasing intensity.   Take Less Medication Yes   Intervention Learn your risk factors and begin the lifestyle modifications for risk factor control during your time in the program.   Understand more about Heart/Pulmonary Disease. Yes   Intervention While in program utilize professionals for any questions, and attend the education sessions. Great websites to use are www.americanheart.org or www.lung.org for reliable information.   Diabetes Yes   Goal Blood glucose control identified by blood glucose values, HgbA1C. Participant verbalizes understanding of the signs/symptoms of hyper/hypo glycemia, proper foot care and importance of medication and nutrition plan for blood glucose control.   Intervention Provide nutrition & aerobic exercise along with prescribed medications to achieve blood glucose in normal ranges: Fasting 65-99 mg/dL      Personal Goals and Risk Factors Review:    Personal Goals Discharge:     Comments: Orientation done today. Will start to attend Cardiac Rehab next week. Explained need to not wear sandals but will wear tennis shoes. Seth Riddle is ready to exercise in Cardiac Rehab.

## 2014-12-22 NOTE — Progress Notes (Signed)
Cardiac Individual Treatment Plan  Patient Details  Name: Seth Riddle MRN: [id number] Date of Birth: 1960/11/07 Referring Provider:  Thayer Headings, MD  Initial Encounter Date:    Patient's Home Medications on Admission:  Current outpatient prescriptions:  .  acetaminophen (TYLENOL) 500 MG tablet, Take 500 mg by mouth every 6 (six) hours as needed., Disp: , Rfl:  .  aspirin EC 325 MG EC tablet, Take 1 tablet (325 mg total) by mouth daily., Disp: 30 tablet, Rfl: 0 .  atorvastatin (LIPITOR) 80 MG tablet, Take 1 tablet (80 mg total) by mouth daily at 6 PM., Disp: 30 tablet, Rfl: 1 .  blood glucose meter kit and supplies KIT, Dispense based on patient and insurance preference. Use up to four times daily as directed. (FOR ICD-9 250.00, 250.01)., Disp: 1 each, Rfl: 0 .  gabapentin (NEURONTIN) 300 MG capsule, Take 1 capsule (300 mg total) by mouth 3 (three) times daily., Disp: 90 capsule, Rfl: 1 .  lisinopril (PRINIVIL,ZESTRIL) 10 MG tablet, Take 1 tablet (10 mg total) by mouth daily., Disp: 30 tablet, Rfl: 1 .  MELATONIN PO, Take 1 tablet by mouth daily as needed (sleep)., Disp: , Rfl:  .  metoprolol tartrate (LOPRESSOR) 25 MG tablet, Take 1 tablet (25 mg total) by mouth 2 (two) times daily., Disp: 60 tablet, Rfl: 1 .  oxyCODONE (OXY IR/ROXICODONE) 5 MG immediate release tablet, Take 1 tablet (5 mg total) by mouth 4 (four) times daily as needed for severe pain., Disp: 30 tablet, Rfl: 0  Past Medical History: Past Medical History  Diagnosis Date  . Dyslipidemia (high LDL; low HDL)   . Family history of premature CAD   . Coronary artery disease   . Low HDL (under 40)   . Diabetes mellitus without complication   . MI (myocardial infarction)     Tobacco Use: History  Smoking status  . Never Smoker   Smokeless tobacco  . Never Used    Labs: Recent Review Flowsheet Data    Labs for ITP Cardiac and Pulmonary Rehab Latest Ref Rng 11/16/2014 11/16/2014 11/16/2014 11/16/2014 11/16/2014    PHART 7.350 - 7.450 7.361 7.375 7.312(L) 7.379 -   PCO2ART 35.0 - 45.0 mmHg 40.0 38.3 41.4 38.7 -   HCO3 20.0 - 24.0 mEq/L 22.3 22.1 20.8 22.8 -   TCO2 0 - 100 mmol/L _0 ACIDBASEDEF 0.0 - 2.0 mmol/L 2.0 2.0 5.0(H) 2.0 -   O2SAT - 97.0 93.0 91.0 90.0 -       Exercise Target Goals:    Exercise Program Goal: Individual exercise prescription set with THRR, safety & activity barriers. Participant demonstrates ability to understand and report RPE using BORG scale, to self-measure pulse accurately, and to acknowledge the importance of the exercise prescription.  Exercise Prescription Goal: Starting with aerobic activity 30 plus minutes a day, 3 days per week for initial exercise prescription. Provide home exercise prescription and guidelines that participant acknowledges understanding prior to discharge.  Activity Barriers & Risk Stratification:   6 Minute Walk:     6 Minute Walk      12/22/14 1535       6 Minute Walk   Distance 1680 feet     Walk Time 6 minutes     Resting HR 66 bpm     Resting BP 124/64 mmHg     Max Ex. HR 87 bpm     Max Ex. BP 132/74 mmHg     RPE 11  Perceived Dyspnea  1        Initial Exercise Prescription:     Initial Exercise Prescription - 12/22/14 1500    Treadmill   MPH 2.5   Grade 0   Minutes 15   Bike   Level 1   Minutes 10   Recumbant Bike   Level 2   Watts 50   Minutes 10   NuStep   Level 3   Watts 50   Minutes 10   Arm Ergometer   Level 1   Watts 10   Minutes 10   Arm/Foot Ergometer   Level 1   Watts 12   Minutes 10   Cybex   Level 1   RPM 40   Minutes 10   Recumbant Elliptical   Level 1   RPM 50   Watts 30   Minutes 10   Elliptical   Level 1   Speed 3   Minutes 2   REL-XR   Level 3   Watts 50   Minutes 10   Prescription Details   Frequency (times per week) 3   Duration Progress to 30 minutes of continuous aerobic without signs/symptoms of physical distress   Intensity   THRR REST +   30   Ratings of Perceived Exertion 11-15   Perceived Dyspnea 2-4   Progression Continue progressive overload as per policy without signs/symptoms or physical distress.   Resistance Training   Training Prescription Yes   Weight 2   Reps 10-12      Exercise Prescription Changes:   Discharge Exercise Prescription:   Nutrition:  Target Goals: Understanding of nutrition guidelines, daily intake of sodium <1569m, cholesterol <2061m calories 30% from fat and 7% or less from saturated fats, daily to have 5 or more servings of fruits and vegetables.  Biometrics:    Nutrition Therapy Plan and Nutrition Goals:   Nutrition Discharge: Rate Your Plate Scores:   Nutrition Goals Re-Evaluation:   Psychosocial: Target Goals: Acknowledge presence or absence of depression, maximize coping skills, provide positive support system. Participant is able to verbalize types and ability to use techniques and skills needed for reducing stress and depression.  Initial Review & Psychosocial Screening:   Quality of Life Scores:   PHQ-9:     Recent Review Flowsheet Data    There is no flowsheet data to display.      Psychosocial Evaluation and Intervention:   Psychosocial Re-Evaluation:   Vocational Rehabilitation: Provide vocational rehab assistance to qualifying candidates.   Vocational Rehab Evaluation & Intervention:   Education: Education Goals: Education classes will be provided on a weekly basis, covering required topics. Participant will state understanding/return demonstration of topics presented.  Learning Barriers/Preferences:   Education Topics: General Nutrition Guidelines/Fats and Fiber: -Group instruction provided by verbal, written material, models and posters to present the general guidelines for heart healthy nutrition. Gives an explanation and review of dietary fats and fiber.   Controlling Sodium/Reading Food Labels: -Group verbal and written material  supporting the discussion of sodium use in heart healthy nutrition. Review and explanation with models, verbal and written materials for utilization of the food label.   Exercise Physiology & Risk Factors: - Group verbal and written instruction with models to review the exercise physiology of the cardiovascular system and associated critical values. Details cardiovascular disease risk factors and the goals associated with each risk factor.   Aerobic Exercise & Resistance Training: - Gives group verbal and written discussion on the health impact of inactivity.  On the components of aerobic and resistive training programs and the benefits of this training and how to safely progress through these programs.   Flexibility, Balance, General Exercise Guidelines: - Provides group verbal and written instruction on the benefits of flexibility and balance training programs. Provides general exercise guidelines with specific guidelines to those with heart or lung disease. Demonstration and skill practice provided.   Stress Management: - Provides group verbal and written instruction about the health risks of elevated stress, cause of high stress, and healthy ways to reduce stress.   Depression: - Provides group verbal and written instruction on the correlation between heart/lung disease and depressed mood, treatment options, and the stigmas associated with seeking treatment.   Anatomy & Physiology of the Heart: - Group verbal and written instruction and models provide basic cardiac anatomy and physiology, with the coronary electrical and arterial systems. Review of: AMI, Angina, Valve disease, Heart Failure, Cardiac Arrhythmia, Pacemakers, and the ICD.   Cardiac Procedures: - Group verbal and written instruction and models to describe the testing methods done to diagnose heart disease. Reviews the outcomes of the test results. Describes the treatment choices: Medical Management, Angioplasty, or  Coronary Bypass Surgery.   Cardiac Medications: - Group verbal and written instruction to review commonly prescribed medications for heart disease. Reviews the medication, class of the drug, and side effects. Includes the steps to properly store meds and maintain the prescription regimen.   Go Sex-Intimacy & Heart Disease, Get SMART - Goal Setting: - Group verbal and written instruction through game format to discuss heart disease and the return to sexual intimacy. Provides group verbal and written material to discuss and apply goal setting through the application of the S.M.A.R.T. Method.   Other Matters of the Heart: - Provides group verbal, written materials and models to describe Heart Failure, Angina, Valve Disease, and Diabetes in the realm of heart disease. Includes description of the disease process and treatment options available to the cardiac patient.   Exercise & Equipment Safety: - Individual verbal instruction and demonstration of equipment use and safety with use of the equipment.   Infection Prevention: - Provides verbal and written material to individual with discussion of infection control including proper hand washing and proper equipment cleaning during exercise session.   Falls Prevention: - Provides verbal and written material to individual with discussion of falls prevention and safety.   Diabetes: - Individual verbal and written instruction to review signs/symptoms of diabetes, desired ranges of glucose level fasting, after meals and with exercise. Advice that pre and post exercise glucose checks will be done for 3 sessions at entry of program.    Knowledge Questionnaire Score:   Personal Goals and Risk Factors at Admission:   Personal Goals and Risk Factors Review:    Personal Goals Discharge:     Comments: Orientation done.

## 2014-12-23 DIAGNOSIS — E119 Type 2 diabetes mellitus without complications: Secondary | ICD-10-CM | POA: Insufficient documentation

## 2014-12-23 DIAGNOSIS — I2510 Atherosclerotic heart disease of native coronary artery without angina pectoris: Secondary | ICD-10-CM | POA: Insufficient documentation

## 2014-12-28 DIAGNOSIS — Z951 Presence of aortocoronary bypass graft: Secondary | ICD-10-CM

## 2014-12-28 LAB — GLUCOSE, CAPILLARY
Glucose-Capillary: 99 mg/dL (ref 70–99)
Glucose-Capillary: 110 mg/dL — ABNORMAL HIGH (ref 70–99)

## 2014-12-28 NOTE — Progress Notes (Signed)
Daily Session Note  Patient Details  Name: Sang P Wombles MRN: [id number] Date of Birth: 06/17/1961 Referring Provider:  Nahser, Philip J, MD  Encounter Date: 12/28/2014  Check In:     Session Check In - 12/28/14 0856    Check-In   Staff Present Susanne Bice RN, BSN, CCRP;Steven Way BS, ACSM EP-C, Exercise Physiologist;Renee MacMillan MS, ACSM CEP Exercise Physiologist   ER physicians immediately available to respond to emergencies See telemetry face sheet for immediately available ER MD   Medication changes reported     No   Fall or balance concerns reported    No   Warm-up and Cool-down Performed on first and last piece of equipment   VAD Patient? No   Pain Assessment   Currently in Pain? No/denies         Goals Met:  Proper associated with RPD/PD & O2 Sat Exercise tolerated well No report of cardiac concerns or symptoms  Goals Unmet:  Not Applicable  Goals Comments:    Dr. Mark Miller is Medical Director for [facility] and [facility]. 

## 2014-12-28 NOTE — Addendum Note (Signed)
Addended by: Virgina Organ on: 12/28/2014 02:23 PM   Modules accepted: Medications

## 2014-12-30 ENCOUNTER — Encounter: Payer: BC Managed Care – PPO | Admitting: *Deleted

## 2014-12-30 DIAGNOSIS — Z951 Presence of aortocoronary bypass graft: Secondary | ICD-10-CM | POA: Diagnosis not present

## 2014-12-30 LAB — GLUCOSE, CAPILLARY
GLUCOSE-CAPILLARY: 108 mg/dL — AB (ref 65–99)
GLUCOSE-CAPILLARY: 102 mg/dL — AB (ref 65–99)

## 2014-12-30 NOTE — Progress Notes (Signed)
Daily Session Note  Patient Details  Name: Seth Riddle MRN: [id number] Date of Birth: 1961/02/25 Referring Provider:  Thayer Headings, MD  Encounter Date: 12/30/2014  Check In:     Session Check In - 12/30/14 0924    Check-In   Staff Present Heath Lark RN, BSN, CCRP;Carroll Enterkin RN, BSN;Renee Dillard Essex MS, ACSM CEP Exercise Physiologist   ER physicians immediately available to respond to emergencies See telemetry face sheet for immediately available ER MD   Medication changes reported     No   Fall or balance concerns reported    No   Warm-up and Cool-down Performed on first and last piece of equipment   Pain Assessment   Currently in Pain? No/denies           Exercise Prescription Changes - 12/30/14 0900    Treadmill   MPH 4   Grade 0      Goals Met:  Independence with exercise equipment Exercise tolerated well Personal goals reviewed No report of cardiac concerns or symptoms Strength training completed today  Goals Unmet:  Not Applicable  Goals Comments:    Dr. Emily Filbert is Medical Director for [facility].

## 2015-01-02 ENCOUNTER — Ambulatory Visit (INDEPENDENT_AMBULATORY_CARE_PROVIDER_SITE_OTHER): Payer: BC Managed Care – PPO

## 2015-01-02 ENCOUNTER — Encounter: Payer: BC Managed Care – PPO | Admitting: *Deleted

## 2015-01-02 ENCOUNTER — Other Ambulatory Visit: Payer: Self-pay

## 2015-01-02 DIAGNOSIS — I2510 Atherosclerotic heart disease of native coronary artery without angina pectoris: Secondary | ICD-10-CM

## 2015-01-02 DIAGNOSIS — Z951 Presence of aortocoronary bypass graft: Secondary | ICD-10-CM

## 2015-01-02 LAB — GLUCOSE, CAPILLARY
Glucose-Capillary: 99 mg/dL (ref 65–99)
Glucose-Capillary: 101 mg/dL — ABNORMAL HIGH (ref 65–99)

## 2015-01-02 NOTE — Progress Notes (Signed)
Daily Session Note  Patient Details  Name: Seth Riddle MRN: [id number] Date of Birth: [birth year] Referring Provider:  Thayer Headings, MD  Encounter Date: 01/02/2015  Check In:     Session Check In - 01/02/15 0855    Check-In   Staff Present Heath Lark RN, BSN, CCRP;Kelly Hayes BS, ACSM CEP Exercise Physiologist;Shevaun Lovan Dillard Essex MS, ACSM CEP Exercise Physiologist   ER physicians immediately available to respond to emergencies See telemetry face sheet for immediately available ER MD   Medication changes reported     No   Fall or balance concerns reported    No   Warm-up and Cool-down Performed on first and last piece of equipment   VAD Patient? No   Pain Assessment   Currently in Pain? No/denies   Multiple Pain Sites No         Goals Met:  Independence with exercise equipment Exercise tolerated well No report of cardiac concerns or symptoms  Goals Unmet:  Not Applicable  Goals Comments:    Dr. Emily Filbert is Medical Director for [facility] and [facility].

## 2015-01-05 ENCOUNTER — Ambulatory Visit (INDEPENDENT_AMBULATORY_CARE_PROVIDER_SITE_OTHER): Payer: BC Managed Care – PPO | Admitting: Cardiovascular Disease

## 2015-01-05 ENCOUNTER — Encounter: Payer: Self-pay | Admitting: Cardiovascular Disease

## 2015-01-05 VITALS — BP 100/70 | HR 53 | Ht 70.0 in | Wt 243.8 lb

## 2015-01-05 DIAGNOSIS — I5022 Chronic systolic (congestive) heart failure: Secondary | ICD-10-CM | POA: Diagnosis not present

## 2015-01-05 DIAGNOSIS — I2102 ST elevation (STEMI) myocardial infarction involving left anterior descending coronary artery: Secondary | ICD-10-CM | POA: Diagnosis not present

## 2015-01-05 DIAGNOSIS — Z951 Presence of aortocoronary bypass graft: Secondary | ICD-10-CM | POA: Diagnosis not present

## 2015-01-05 DIAGNOSIS — I25810 Atherosclerosis of coronary artery bypass graft(s) without angina pectoris: Secondary | ICD-10-CM | POA: Diagnosis not present

## 2015-01-05 MED ORDER — ASPIRIN EC 81 MG PO TBEC: 81.0000 mg | DELAYED_RELEASE_TABLET | Freq: Every day | ORAL | Status: DC

## 2015-01-05 NOTE — Progress Notes (Addendum)
Cardiology Office Note   Date:  01/05/2015   ID:  Seth Riddle, DOB [birth year], MRN [id number]  PCP:  Rozanna Box, MD  Cardiologist:   Vesta Mixer, MD   Chief Complaint  Patient presents with  . Coronary Artery Disease    1 month follow up as well as discuss echo and his CABG.  Meds reviewed by the patient verbally.        History of Present Illness: Seth Riddle is a 54 y.o. male who presents for follow-up for his recent hospitalization for coronary artery disease, myocardial infarction and subsequent coronary artery bypass grafting. His admitted with symptoms of unstable angina. Cardiac catheterization the following day revealed significant three-vessel coronary artery disease.  The left main coronary artery was angiographically normal and bifurcated into the LAD and left circumflex coronary artery.  he LAD was a large caliber vessel and immmediately gave rise to a very high diagonal (ramus intermediate like vessel). There was diffuse 95% stenosis in the LAD in the region of this diagonal takeoff with 90-95% stenosis diffusely just beyond the origin of this ramus like vessel and 90% diffuse stenosis at the origin of the ramus like high diagonal vessel almost immediately after the left main. The left circumflex coronary artery was a moderate size vessel that gave rise to one major marginal branch. There was 95% focal stenosis just beyond an ectatic segment in the proximal obtuse marginal branch and there was diffuse distal 90% marginal stenosis. The RCA was angiographically normal , dominant vessel that gave rise to a large PDA and PLA vessel.  Left ventriculography revealed normal global LV contractility without focal segmental wall motion abnormalities. There was no evidence for mitral regurgitation. Ejection fraction is 55-60%.    Unfortunately, the night after his cardiac catheterization he occluded his left anterior descending artery and had ongoing chest  pain. He returned to the Cath Lab the following morning an emergency balloon angioplasty  was performed to open his LAD.    He was taken to emergent coronary artery bypass grafting at that time. Had a relatively slow recovery but has done fairly well. Echo done prior to DC revealed mild LV dysfunction:  Left ventricle: The cavity size was normal. Wall thickness was increased in a pattern of mild LVH. Systolic function was mildly reduced. The estimated ejection fraction was in the range of 45% to 50%. Diffuse hypokinesis. Doppler parameters are consistent with abnormal left ventricular relaxation (grade 1 diastolic dysfunction). - Right ventricle: The cavity size was mildly dilated. - Right atrium: The atrium was mildly dilated.  Impressions:  - Mild global reduction in LV function (EF 50); grade 1 diastolic dysfunction; mild RAE/RVE.    CABG  Left internal mammary artery to left anterior descending Saphenous vein graft to first diagonal Saphenous vein graft to first obtuse marginal   He has had significant right leg pain since the initial cath. He's had back surgery and the pain feels like a slipped disc.  Jan 05, 2015: Seth Riddle is doing well from a cardiac standpoint. He continues to have severe leg pain. He apparently had some injury that occurred following the initial cardiac catheterization prior to his bypass surgery. His echo from this week shows improved LV systolic function.  - Left ventricle: The cavity size was normal. Wall thickness was increased in a pattern of mild LVH. Systolic function was mildly to moderately reduced. The estimated ejection fraction was in the range of 40% to 45%. Akinesis of the  apical myocardium. Moderate hypokinesis of the apicalanterior myocardium.     Past Medical History  Diagnosis Date  . Dyslipidemia (high LDL; low HDL)   . Family history of premature CAD   . Coronary artery disease   . Low HDL  (under 40)   . Diabetes mellitus without complication   . MI (myocardial infarction)     Past Surgical History  Procedure Laterality Date  . Left heart catheterization with coronary angiogram N/A 11/14/2014    Procedure: LEFT HEART CATHETERIZATION WITH CORONARY ANGIOGRAM;  Surgeon: Troy Sine, MD;  Location: [facility] CATH LAB;  Service: Cardiovascular;  Laterality: N/A;  . Intra-aortic balloon pump insertion N/A 11/15/2014    Procedure: INTRA-AORTIC BALLOON PUMP INSERTION;  Surgeon: Troy Sine, MD;  Location: [facility] CATH LAB;  Service: Cardiovascular;  Laterality: N/A;  . Percutaneous coronary stent intervention (pci-s) N/A 11/15/2014    Procedure: PERCUTANEOUS CORONARY STENT INTERVENTION (PCI-S);  Surgeon: Troy Sine, MD;  Location: [facility] CATH LAB;  Service: Cardiovascular;  Laterality: N/A;  . Coronary artery bypass graft N/A 11/15/2014    Procedure: CORONARY ARTERY BYPASS GRAFTING (CABG), ON PUMP, TIMES THREE, USING LEFT INTERNAL MAMMARY ARTERY, RIGHT GREATER SAPHENOUS VEIN HARVESTED ENDOSCOPICALLY;  Surgeon: Melrose Nakayama, MD;  Location: [facility];  Service: Open Heart Surgery;  Laterality: N/A;  . Tee without cardioversion N/A 11/15/2014    Procedure: TRANSESOPHAGEAL ECHOCARDIOGRAM (TEE);  Surgeon: Melrose Nakayama, MD;  Location: [facility];  Service: Open Heart Surgery;  Laterality: N/A;     Current Outpatient Prescriptions  Medication Sig Dispense Refill  . acetaminophen (TYLENOL) 500 MG tablet Take 500 mg by mouth every 6 (six) hours as needed.    Marland Kitchen aspirin EC 325 MG EC tablet Take 1 tablet (325 mg total) by mouth daily. 30 tablet 0  . atorvastatin (LIPITOR) 80 MG tablet Take 1 tablet (80 mg total) by mouth daily at 6 PM. 30 tablet 1  . blood glucose meter kit and supplies KIT Dispense based on patient and insurance preference. Use up to four times daily as directed. (FOR ICD-9 250.00, 250.01). 1 each 0  . gabapentin (NEURONTIN) 300 MG capsule Take 1 capsule (300 mg total) by mouth 3  (three) times daily. 90 capsule 1  . lisinopril (PRINIVIL,ZESTRIL) 10 MG tablet Take 1 tablet (10 mg total) by mouth daily. 30 tablet 1  . MELATONIN PO Take 1 tablet by mouth daily as needed (sleep).    . metFORMIN (GLUCOPHAGE) 500 MG tablet Take 500 mg by mouth 2 (two) times daily with a meal.    . metoprolol tartrate (LOPRESSOR) 25 MG tablet Take 1 tablet (25 mg total) by mouth 2 (two) times daily. 60 tablet 1  . oxyCODONE (OXY IR/ROXICODONE) 5 MG immediate release tablet Take 1 tablet (5 mg total) by mouth 4 (four) times daily as needed for severe pain. 30 tablet 0   No current facility-administered medications for this visit.    Allergies:   Coconut oil    Social History:  The patient  reports that he has never smoked. He has never used smokeless tobacco. He reports that he does not drink alcohol or use illicit drugs.   Family History:  The patient's family history includes Heart attack in his father; Heart disease in his mother; Stroke in his father and mother.    ROS:  Please see the history of present illness.    Review of Systems: Constitutional:  denies fever, chills, diaphoresis, appetite change and fatigue.  HEENT:  denies photophobia, eye pain, redness, hearing loss, ear pain, congestion, sore throat, rhinorrhea, sneezing, neck pain, neck stiffness and tinnitus.  Respiratory: denies SOB, DOE, cough, chest tightness, and wheezing.  Cardiovascular: denies chest pain, palpitations and leg swelling.  Gastrointestinal: denies nausea, vomiting, abdominal pain, diarrhea, constipation, blood in stool.  Genitourinary: denies dysuria, urgency, frequency, hematuria, flank pain and difficulty urinating.  Musculoskeletal: admits to  myalgias, back pain / significant right leg pain   Skin: denies pallor, rash and wound.  Neurological: denies dizziness, seizures, syncope, weakness, light-headedness, numbness and headaches.   Hematological: denies adenopathy, easy bruising, personal or  family bleeding history.  Psychiatric/ Behavioral: denies suicidal ideation, mood changes, confusion, nervousness, sleep disturbance and agitation.       All other systems are reviewed and negative.    PHYSICAL EXAM: VS:  BP 100/70 mmHg  Pulse 53  Ht $R'5\' 10"'KB$  (1.778 m)  Wt 110.564 kg (243 lb 12 oz)  BMI 34.97 kg/m2 , BMI Body mass index is 34.97 kg/(m^2). GEN: Well nourished, well developed, in no acute distress HEENT: normal Neck: no JVD, carotid bruits, or masses Cardiac: RRR; no murmurs, rubs, or gallops,no edema . His sternotomy scar is healing well.  The thoracostomy sites are healing well. Respiratory:  clear to auscultation bilaterally, normal work of breathing.  GI: soft, nontender, nondistended, + BS MS: no deformity or atrophyThe saphenous vein harvest sites are healing well. Skin: warm and dry, no rash Neuro:  Strength and sensation are intact Psych: normal   EKG:  EKG is ordered today. The ekg ordered today demonstrates sinus bradycardia at 54. His poor R-wave progression suggestive of a previous anterior wall myocardial infarction. There is T-wave inversions in lead 1 and aVL. There no changes from previous EKGs.   Recent Labs: 11/16/2014: Magnesium 2.0 11/18/2014: ALT 39 11/19/2014: BUN 29*; Creatinine 0.98; Hemoglobin 12.7*; Platelets 145*; Potassium 4.0; Sodium 139    Lipid Panel    Component Value Date/Time   CHOL 174 11/14/2014 0350   TRIG 168* 11/14/2014 0350   HDL 24* 11/14/2014 0350   CHOLHDL 7.3 11/14/2014 0350   VLDL 34 11/14/2014 0350   LDLCALC 116* 11/14/2014 0350      Wt Readings from Last 3 Encounters:  01/05/15 110.564 kg (243 lb 12 oz)  12/13/14 111.131 kg (245 lb)  12/08/14 111.131 kg (245 lb)      Other studies Reviewed: Additional studies/ records that were reviewed today include: . Review of the above records demonstrates:    ASSESSMENT AND PLAN:  1.  Coronary artery disease: He status post coronary artery bypass  grafting. -LIMA to LAD -SVG to DIAGONAL 1 -SVG to OM1  He's not having any episodes of angina. We will continue the current dose of aspirin. I anticipate decreasing his aspirin to 81 mg a day .  I'll see him again in 3 months.  2. Chronic systolic congestive heart failure: The patient had an acute anterior wall myocardial infarction resulting in decreased LV function. His echocardiogram shows that his left ventricle systolic function has improved since his second heart cavitation. I do not have an inaccurate LV EF but the description from Dr. Roxan Hockey  describes an akinetic anterior wall. His ejection fraction is now 40-45% and he does not need an ICD .  Marland Kitchen He'll continue with the metoprolol and lisinopril for now.    3. Hyperlipidemia: Continue atorvastatin and will also refer him to a nutritionist. - Amy Hager   4. Leg pain :  He continues  to have leg pain  . His pain is very sharp. This apparently occurred when he was on the cath table. I think he has a ruptured disc. He has seen his medical doctor and has been prescribed bedrest. I've encouraged him to see an neurologist if the pains persist.    Current medicines are reviewed at length with the patient today.  The patient does not have concerns regarding medicines.  The following changes have been made:  no change  Labs/ tests ordered today include:   Orders Placed This Encounter  Procedures  . EKG 12-Lead     Disposition:   FU with me in 3 months.    Signed, Nahser, Wonda Cheng, MD  01/05/2015 3:55 PM    [address], [place], [place]  [address] Phone: [phone]; Fax: [phone]

## 2015-01-05 NOTE — Patient Instructions (Addendum)
Medication Instructions:  Please decrease your aspirin to 81 mg daily  Labwork: None  Testing/Procedures: None  Follow-Up: Your physician wants you to follow-up in: 3 months.  You will receive a reminder letter in the mail two months in advance.  If you don't receive a letter, please call our office to schedule the follow-up appointment.

## 2015-01-06 ENCOUNTER — Encounter: Payer: BC Managed Care – PPO | Admitting: *Deleted

## 2015-01-06 ENCOUNTER — Telehealth: Payer: Self-pay | Admitting: *Deleted

## 2015-01-06 DIAGNOSIS — Z951 Presence of aortocoronary bypass graft: Secondary | ICD-10-CM | POA: Diagnosis not present

## 2015-01-06 NOTE — Progress Notes (Signed)
Daily Session Note  Patient Details  Name: Seth Riddle MRN: [id number] Date of Birth: 1961-02-02 Referring Provider:  Thayer Headings, MD  Encounter Date: 01/06/2015  Check In:     Session Check In - 01/06/15 0917    Check-In   Staff Present Heath Lark RN, BSN, CCRP;Renee Dillard Essex MS, ACSM CEP Exercise Physiologist;Carroll Enterkin RN, BSN   ER physicians immediately available to respond to emergencies See telemetry face sheet for immediately available ER MD   Medication changes reported     No   Fall or balance concerns reported    No   Warm-up and Cool-down Performed on first and last piece of equipment   VAD Patient? No   Pain Assessment   Currently in Pain? No/denies         Goals Met:  Exercise tolerated well No report of cardiac concerns or symptoms Strength training completed today  Goals Unmet:  Not Applicable  Goals Comments: .   Dr. Emily Filbert is Medical Director for [facility] and [facility].[place]

## 2015-01-09 ENCOUNTER — Encounter: Payer: BC Managed Care – PPO | Admitting: *Deleted

## 2015-01-09 DIAGNOSIS — Z951 Presence of aortocoronary bypass graft: Secondary | ICD-10-CM | POA: Diagnosis not present

## 2015-01-09 NOTE — Progress Notes (Signed)
Daily Session Note  Patient Details  Name: Jatniel P Bernick MRN: [id number] Date of Birth: 12/30/1960 Referring Provider:  Nahser, Philip J, MD  Encounter Date: 01/09/2015  Check In:     Session Check In - 01/09/15 0854    Check-In   Staff Present Kelly Hayes BS, ACSM CEP Exercise Physiologist;Renee MacMillan MS, ACSM CEP Exercise Physiologist;Susanne Bice RN, BSN, CCRP   ER physicians immediately available to respond to emergencies See telemetry face sheet for immediately available ER MD   Medication changes reported     No   Fall or balance concerns reported    No   Warm-up and Cool-down Performed on first and last piece of equipment   VAD Patient? No   Pain Assessment   Currently in Pain? No/denies   Multiple Pain Sites No         Goals Met:  Independence with exercise equipment Exercise tolerated well No report of cardiac concerns or symptoms  Goals Unmet:  Not Applicable  Goals Comments:    Dr. Mark Miller is Medical Director for [facility] and [facility]. 

## 2015-01-09 NOTE — Progress Notes (Addendum)
Cardiac Individual Treatment Plan  Patient Details  Name: Seth Riddle MRN: [id number] Date of Birth: November 10, 1960 Referring Provider:  Thayer Headings, MD  Initial Encounter Date:    Patient's Home Medications on Admission:  Current outpatient prescriptions:  .  acetaminophen (TYLENOL) 500 MG tablet, Take 500 mg by mouth every 6 (six) hours as needed., Disp: , Rfl:  .  aspirin EC 81 MG tablet, Take 1 tablet (81 mg total) by mouth daily., Disp: 90 tablet, Rfl: 3 .  atorvastatin (LIPITOR) 80 MG tablet, Take 1 tablet (80 mg total) by mouth daily at 6 PM., Disp: 30 tablet, Rfl: 1 .  blood glucose meter kit and supplies KIT, Dispense based on patient and insurance preference. Use up to four times daily as directed. (FOR ICD-9 250.00, 250.01)., Disp: 1 each, Rfl: 0 .  gabapentin (NEURONTIN) 300 MG capsule, Take 1 capsule (300 mg total) by mouth 3 (three) times daily., Disp: 90 capsule, Rfl: 1 .  lisinopril (PRINIVIL,ZESTRIL) 10 MG tablet, Take 1 tablet (10 mg total) by mouth daily., Disp: 30 tablet, Rfl: 1 .  MELATONIN PO, Take 1 tablet by mouth daily as needed (sleep)., Disp: , Rfl:  .  metFORMIN (GLUCOPHAGE) 500 MG tablet, Take 500 mg by mouth 2 (two) times daily with a meal., Disp: , Rfl:  .  metoprolol tartrate (LOPRESSOR) 25 MG tablet, Take 1 tablet (25 mg total) by mouth 2 (two) times daily., Disp: 60 tablet, Rfl: 1 .  oxyCODONE (OXY IR/ROXICODONE) 5 MG immediate release tablet, Take 1 tablet (5 mg total) by mouth 4 (four) times daily as needed for severe pain., Disp: 30 tablet, Rfl: 0  Past Medical History: Past Medical History  Diagnosis Date  . Dyslipidemia (high LDL; low HDL)   . Family history of premature CAD   . Coronary artery disease   . Low HDL (under 40)   . Diabetes mellitus without complication   . MI (myocardial infarction)     Tobacco Use: History  Smoking status  . Never Smoker   Smokeless tobacco  . Never Used    Labs: Recent Review Flowsheet Data    Labs for ITP Cardiac and Pulmonary Rehab Latest Ref Rng 11/16/2014 11/16/2014 11/16/2014 11/16/2014 11/16/2014   PHART 7.350 - 7.450 7.361 7.375 7.312(L) 7.379 -   PCO2ART 35.0 - 45.0 mmHg 40.0 38.3 41.4 38.7 -   HCO3 20.0 - 24.0 mEq/L 22.3 22.1 20.8 22.8 -   TCO2 0 - 100 mmol/L _0 ACIDBASEDEF 0.0 - 2.0 mmol/L 2.0 2.0 5.0(H) 2.0 -   O2SAT - 97.0 93.0 91.0 90.0 -       Exercise Target Goals:    Exercise Program Goal: Individual exercise prescription set with THRR, safety & activity barriers. Participant demonstrates ability to understand and report RPE using BORG scale, to self-measure pulse accurately, and to acknowledge the importance of the exercise prescription.  Exercise Prescription Goal: Starting with aerobic activity 30 plus minutes a day, 3 days per week for initial exercise prescription. Provide home exercise prescription and guidelines that participant acknowledges understanding prior to discharge.  Activity Barriers & Risk Stratification:     Activity Barriers & Risk Stratification - 12/22/14 1728    Activity Barriers & Risk Stratification   Activity Barriers None   Risk Stratification High      6 Minute Walk:     6 Minute Walk      12/22/14 1535       6 Minute  Walk   Distance 1680 feet     Walk Time 6 minutes     Resting HR 66 bpm     Resting BP 124/64 mmHg     Max Ex. HR 87 bpm     Max Ex. BP 132/74 mmHg     RPE 11     Perceived Dyspnea  1        Initial Exercise Prescription:     Initial Exercise Prescription - 12/22/14 1500    Treadmill   MPH 2.5   Grade 0   Minutes 15   Bike   Level 1   Minutes 10   Recumbant Bike   Level 2   Watts 50   Minutes 10   NuStep   Level 3   Watts 50   Minutes 10   Arm Ergometer   Level 1   Watts 10   Minutes 10   Arm/Foot Ergometer   Level 1   Watts 12   Minutes 10   Cybex   Level 1   RPM 40   Minutes 10   Recumbant Elliptical   Level 1   RPM 50   Watts 30   Minutes 10    Elliptical   Level 1   Speed 3   Minutes 2   REL-XR   Level 3   Watts 50   Minutes 10   Prescription Details   Frequency (times per week) 3   Duration Progress to 30 minutes of continuous aerobic without signs/symptoms of physical distress   Intensity   THRR REST +  30   Ratings of Perceived Exertion 11-15   Perceived Dyspnea 2-4   Progression Continue progressive overload as per policy without signs/symptoms or physical distress.   Resistance Training   Training Prescription Yes   Weight 2   Reps 10-12      Exercise Prescription Changes:     Exercise Prescription Changes      12/30/14 0900 01/09/15 1300         Exercise Review   Progression  Yes      Response to Exercise   Blood Pressure (Admit)  102/72 mmHg      Blood Pressure (Exercise)  140/82 mmHg      Blood Pressure (Exit)  118/60 mmHg      Heart Rate (Admit)  57 bpm      Heart Rate (Exercise)  116 bpm      Heart Rate (Exit)  78 bpm      Rating of Perceived Exertion (Exercise)  13      Treadmill   MPH 4 4.2      Grade 0 0      Minutes  20      Bike   Level  1.1      Minutes  10      Elliptical   Level  3      Speed  4      Minutes  10         Discharge Exercise Prescription:   Nutrition:  Target Goals: Understanding of nutrition guidelines, daily intake of sodium <1559m, cholesterol <202m calories 30% from fat and 7% or less from saturated fats, daily to have 5 or more servings of fruits and vegetables.  Biometrics:    Nutrition Therapy Plan and Nutrition Goals:     Nutrition Therapy & Goals - 12/22/14 1732    Intervention Plan   Intervention Using nutrition plan and personal goals to gain  a healthy nutrition lifestyle. Add exercise as prescribed.      Nutrition Discharge: Rate Your Plate Scores:   Nutrition Goals Re-Evaluation:     Nutrition Goals Re-Evaluation      01/06/15 1011           Personal Goal #1 Re-Evaluation   Personal Goal #1 Is eating healtier       Goal  Progress Seen Yes          Psychosocial: Target Goals: Acknowledge presence or absence of depression, maximize coping skills, provide positive support system. Participant is able to verbalize types and ability to use techniques and skills needed for reducing stress and depression.  Initial Review & Psychosocial Screening:     Initial Psych Review & Screening - 12/22/14 [place]? Yes   Barriers   Psychosocial barriers to participate in program There are no identifiable barriers or psychosocial needs.   Screening Interventions   Interventions Encouraged to exercise      Quality of Life Scores:     Quality of Life - 12/22/14 1734    Quality of Life Scores   Health/Function Pre 22.25 %   Socioeconomic Pre 23.86 %   Psych/Spiritual Pre 23.5 %   Family Pre 27 %   GLOBAL Pre 23.58 %      PHQ-9:     Recent Review Flowsheet Data    Depression screen [facility] 2/9 12/22/2014   Decreased Interest 0   Down, Depressed, Hopeless 0   PHQ - 2 Score 0   Altered sleeping 2   Tired, decreased energy 0   Change in appetite 0   Feeling bad or failure about yourself  0   Trouble concentrating 0   Moving slowly or fidgety/restless 0   Suicidal thoughts 0   PHQ-9 Score 2      Psychosocial Evaluation and Intervention:     Psychosocial Evaluation - 12/22/14 1734    Psychosocial Evaluation & Interventions   Interventions Encouraged to exercise with the program and follow exercise prescription      Psychosocial Re-Evaluation:   Vocational Rehabilitation: Provide vocational rehab assistance to qualifying candidates.   Vocational Rehab Evaluation & Intervention:     Vocational Rehab - 12/22/14 1728    Initial Vocational Rehab Evaluation & Intervention   Assessment shows need for Vocational Rehabilitation No      Education: Education Goals: Education classes will be provided on a weekly basis, covering required topics. Participant will state  understanding/return demonstration of topics presented.  Learning Barriers/Preferences:     Learning Barriers/Preferences - 12/22/14 1728    Learning Barriers/Preferences   Learning Barriers None   Learning Preferences None      Education Topics: General Nutrition Guidelines/Fats and Fiber: -Group instruction provided by verbal, written material, models and posters to present the general guidelines for heart healthy nutrition. Gives an explanation and review of dietary fats and fiber.   Controlling Sodium/Reading Food Labels: -Group verbal and written material supporting the discussion of sodium use in heart healthy nutrition. Review and explanation with models, verbal and written materials for utilization of the food label.   Exercise Physiology & Risk Factors: - Group verbal and written instruction with models to review the exercise physiology of the cardiovascular system and associated critical values. Details cardiovascular disease risk factors and the goals associated with each risk factor.   Aerobic Exercise & Resistance Training: - Gives group verbal and written discussion on the health impact  of inactivity. On the components of aerobic and resistive training programs and the benefits of this training and how to safely progress through these programs.          Most Recent Value   Date  01/09/15   Educator  Dorisann Frames   Instruction Review Code  2- meets goals/outcomes      Flexibility, Balance, General Exercise Guidelines: - Provides group verbal and written instruction on the benefits of flexibility and balance training programs. Provides general exercise guidelines with specific guidelines to those with heart or lung disease. Demonstration and skill practice provided.   Stress Management: - Provides group verbal and written instruction about the health risks of elevated stress, cause of high stress, and healthy ways to reduce stress.   Depression: - Provides  group verbal and written instruction on the correlation between heart/lung disease and depressed mood, treatment options, and the stigmas associated with seeking treatment.   Anatomy & Physiology of the Heart: - Group verbal and written instruction and models provide basic cardiac anatomy and physiology, with the coronary electrical and arterial systems. Review of: AMI, Angina, Valve disease, Heart Failure, Cardiac Arrhythmia, Pacemakers, and the ICD.   Cardiac Procedures: - Group verbal and written instruction and models to describe the testing methods done to diagnose heart disease. Reviews the outcomes of the test results. Describes the treatment choices: Medical Management, Angioplasty, or Coronary Bypass Surgery.   Cardiac Medications: - Group verbal and written instruction to review commonly prescribed medications for heart disease. Reviews the medication, class of the drug, and side effects. Includes the steps to properly store meds and maintain the prescription regimen.      Most Recent Value   Date  01/02/15   Educator  SB   Instruction Review Code  2- meets goals/outcomes      Go Sex-Intimacy & Heart Disease, Get SMART - Goal Setting: - Group verbal and written instruction through game format to discuss heart disease and the return to sexual intimacy. Provides group verbal and written material to discuss and apply goal setting through the application of the S.M.A.R.T. Method.   Other Matters of the Heart: - Provides group verbal, written materials and models to describe Heart Failure, Angina, Valve Disease, and Diabetes in the realm of heart disease. Includes description of the disease process and treatment options available to the cardiac patient.   Exercise & Equipment Safety: - Individual verbal instruction and demonstration of equipment use and safety with use of the equipment.   Infection Prevention: - Provides verbal and written material to individual with discussion  of infection control including proper hand washing and proper equipment cleaning during exercise session.   Falls Prevention: - Provides verbal and written material to individual with discussion of falls prevention and safety.   Diabetes: - Individual verbal and written instruction to review signs/symptoms of diabetes, desired ranges of glucose level fasting, after meals and with exercise. Advice that pre and post exercise glucose checks will be done for 3 sessions at entry of program.    Knowledge Questionnaire Score:   Personal Goals and Risk Factors at Admission:     Personal Goals and Risk Factors at Admission - 12/22/14 1732    Personal Goals and Risk Factors on Admission    Weight Management Yes   Intervention Learn and follow the exercise and diet guidelines while in the program. Utilize the nutrition and education classes to help gain knowledge of the diet and exercise expectations in the program  Increase Aerobic Exercise and Physical Activity Yes   Intervention While in program, learn and follow the exercise prescription taught. Start at a low level workload and increase workload after able to maintain previous level for 30 minutes. Increase time before increasing intensity.   Take Less Medication Yes   Intervention Learn your risk factors and begin the lifestyle modifications for risk factor control during your time in the program.   Understand more about Heart/Pulmonary Disease. Yes   Intervention While in program utilize professionals for any questions, and attend the education sessions. Great websites to use are www.americanheart.org or www.lung.org for reliable information.   Diabetes Yes   Goal Blood glucose control identified by blood glucose values, HgbA1C. Participant verbalizes understanding of the signs/symptoms of hyper/hypo glycemia, proper foot care and importance of medication and nutrition plan for blood glucose control.   Intervention Provide nutrition &  aerobic exercise along with prescribed medications to achieve blood glucose in normal ranges: Fasting 65-99 mg/dL      Personal Goals and Risk Factors Review:      Goals and Risk Factor Review      01/06/15 1012           Increase Aerobic Exercise and Physical Activity   Goals Progress/Improvement seen  Yes       Comments Doing well on Elliptical and Treadmill          Personal Goals Discharge:     Comments: 30 day review

## 2015-01-10 ENCOUNTER — Encounter: Payer: Self-pay | Admitting: *Deleted

## 2015-01-10 NOTE — Progress Notes (Signed)
Cardiac Individual Treatment Plan  Patient Details  Name: Seth Riddle MRN: [id number] Date of Birth: [birth year] Referring Provider:  Dr. Joaquim Nam Initial Encounter Date:  12/22/2014 Diagnosis  AMI  Patient's Home Medications on Admission:  Current outpatient prescriptions:  .  acetaminophen (TYLENOL) 500 MG tablet, Take 500 mg by mouth every 6 (six) hours as needed., Disp: , Rfl:  .  aspirin EC 81 MG tablet, Take 1 tablet (81 mg total) by mouth daily., Disp: 90 tablet, Rfl: 3 .  atorvastatin (LIPITOR) 80 MG tablet, Take 1 tablet (80 mg total) by mouth daily at 6 PM., Disp: 30 tablet, Rfl: 1 .  blood glucose meter kit and supplies KIT, Dispense based on patient and insurance preference. Use up to four times daily as directed. (FOR ICD-9 250.00, 250.01)., Disp: 1 each, Rfl: 0 .  gabapentin (NEURONTIN) 300 MG capsule, Take 1 capsule (300 mg total) by mouth 3 (three) times daily., Disp: 90 capsule, Rfl: 1 .  lisinopril (PRINIVIL,ZESTRIL) 10 MG tablet, Take 1 tablet (10 mg total) by mouth daily., Disp: 30 tablet, Rfl: 1 .  MELATONIN PO, Take 1 tablet by mouth daily as needed (sleep)., Disp: , Rfl:  .  metFORMIN (GLUCOPHAGE) 500 MG tablet, Take 500 mg by mouth 2 (two) times daily with a meal., Disp: , Rfl:  .  metoprolol tartrate (LOPRESSOR) 25 MG tablet, Take 1 tablet (25 mg total) by mouth 2 (two) times daily., Disp: 60 tablet, Rfl: 1 .  oxyCODONE (OXY IR/ROXICODONE) 5 MG immediate release tablet, Take 1 tablet (5 mg total) by mouth 4 (four) times daily as needed for severe pain., Disp: 30 tablet, Rfl: 0  Past Medical History: Past Medical History  Diagnosis Date  . Dyslipidemia (high LDL; low HDL)   . Family history of premature CAD   . Coronary artery disease   . Low HDL (under 40)   . Diabetes mellitus without complication   . MI (myocardial infarction)     Tobacco Use: History  Smoking status  . Never Smoker   Smokeless tobacco  . Never Used    Labs: Recent Review  Flowsheet Data    Labs for ITP Cardiac and Pulmonary Rehab Latest Ref Rng 11/16/2014 11/16/2014 11/16/2014 11/16/2014 11/16/2014   PHART 7.350 - 7.450 7.361 7.375 7.312(L) 7.379 -   PCO2ART 35.0 - 45.0 mmHg 40.0 38.3 41.4 38.7 -   HCO3 20.0 - 24.0 mEq/L 22.3 22.1 20.8 22.8 -   TCO2 0 - 100 mmol/L $RemoveBe'23 23 22 24 22   'fJwLJnrgE$ ACIDBASEDEF 0.0 - 2.0 mmol/L 2.0 2.0 5.0(H) 2.0 -   O2SAT - 97.0 93.0 91.0 90.0 -       Exercise Target Goals:    Exercise Program Goal: Individual exercise prescription set with THRR, safety & activity barriers. Participant demonstrates ability to understand and report RPE using BORG scale, to self-measure pulse accurately, and to acknowledge the importance of the exercise prescription.  Exercise Prescription Goal: Starting with aerobic activity 30 plus minutes a day, 3 days per week for initial exercise prescription. Provide home exercise prescription and guidelines that participant acknowledges understanding prior to discharge.  Activity Barriers & Risk Stratification:     Activity Barriers & Risk Stratification - 12/22/14 1728    Activity Barriers & Risk Stratification   Activity Barriers None   Risk Stratification High      6 Minute Walk:     6 Minute Walk      12/22/14 1535  6 Minute Walk   Distance 1680 feet     Walk Time 6 minutes     Resting HR 66 bpm     Resting BP 124/64 mmHg     Max Ex. HR 87 bpm     Max Ex. BP 132/74 mmHg     RPE 11     Perceived Dyspnea  1        Initial Exercise Prescription:     Initial Exercise Prescription - 12/22/14 1500    Treadmill   MPH 2.5   Grade 0   Minutes 15   Bike   Level 1   Minutes 10   Recumbant Bike   Level 2   Watts 50   Minutes 10   NuStep   Level 3   Watts 50   Minutes 10   Arm Ergometer   Level 1   Watts 10   Minutes 10   Arm/Foot Ergometer   Level 1   Watts 12   Minutes 10   Cybex   Level 1   RPM 40   Minutes 10   Recumbant Elliptical   Level 1   RPM 50   Watts 30    Minutes 10   Elliptical   Level 1   Speed 3   Minutes 2   REL-XR   Level 3   Watts 50   Minutes 10   Prescription Details   Frequency (times per week) 3   Duration Progress to 30 minutes of continuous aerobic without signs/symptoms of physical distress   Intensity   THRR REST +  30   Ratings of Perceived Exertion 11-15   Perceived Dyspnea 2-4   Progression Continue progressive overload as per policy without signs/symptoms or physical distress.   Resistance Training   Training Prescription Yes   Weight 2   Reps 10-12      Exercise Prescription Changes:     Exercise Prescription Changes      12/30/14 0900 01/09/15 1300 01/10/15 0600       Exercise Review   Progression  Yes Yes     Response to Exercise   Blood Pressure (Admit)  102/72 mmHg 102/72 mmHg     Blood Pressure (Exercise)  140/82 mmHg 140/82 mmHg     Blood Pressure (Exit)  118/60 mmHg 118/60 mmHg     Heart Rate (Admit)  57 bpm 57 bpm     Heart Rate (Exercise)  116 bpm 116 bpm     Heart Rate (Exit)  78 bpm 78 bpm     Rating of Perceived Exertion (Exercise)  13 13     Resistance Training   Training Prescription   Yes     Weight   3     Reps   10-12     Interval Training   Interval Training   Yes     Equipment   Treadmill     Comments   4.2/0 3 min, 4.5/0 30 sec - 1 min and then drop back to 4.2/0 and repeat     Treadmill   MPH 4 4.2 4.2     Grade 0 0 0     Minutes  20 20     Bike   Level  1.1 1.1     Minutes  10 10     Elliptical   Level  3 3     Speed  4 4     Minutes  10 10  Discharge Exercise Prescription:   Nutrition:  Target Goals: Understanding of nutrition guidelines, daily intake of sodium '1500mg'$ , cholesterol '200mg'$ , calories 30% from fat and 7% or less from saturated fats, daily to have 5 or more servings of fruits and vegetables.  Biometrics:    Nutrition Therapy Plan and Nutrition Goals:     Nutrition Therapy & Goals - 12/22/14 1732    Intervention Plan   Intervention  Using nutrition plan and personal goals to gain a healthy nutrition lifestyle. Add exercise as prescribed.      Nutrition Discharge: Rate Your Plate Scores:   Nutrition Goals Re-Evaluation:     Nutrition Goals Re-Evaluation      01/06/15 1011           Personal Goal #1 Re-Evaluation   Personal Goal #1 Is eating healtier       Goal Progress Seen Yes          Psychosocial: Target Goals: Acknowledge presence or absence of depression, maximize coping skills, provide positive support system. Participant is able to verbalize types and ability to use techniques and skills needed for reducing stress and depression.  Initial Review & Psychosocial Screening:     Initial Psych Review & Screening - 12/22/14 [place]? Yes   Barriers   Psychosocial barriers to participate in program There are no identifiable barriers or psychosocial needs.   Screening Interventions   Interventions Encouraged to exercise      Quality of Life Scores:     Quality of Life - 12/22/14 1734    Quality of Life Scores   Health/Function Pre 22.25 %   Socioeconomic Pre 23.86 %   Psych/Spiritual Pre 23.5 %   Family Pre 27 %   GLOBAL Pre 23.58 %      PHQ-9:     Recent Review Flowsheet Data    Depression screen [facility] 2/9 12/22/2014   Decreased Interest 0   Down, Depressed, Hopeless 0   PHQ - 2 Score 0   Altered sleeping 2   Tired, decreased energy 0   Change in appetite 0   Feeling bad or failure about yourself  0   Trouble concentrating 0   Moving slowly or fidgety/restless 0   Suicidal thoughts 0   PHQ-9 Score 2      Psychosocial Evaluation and Intervention:     Psychosocial Evaluation - 12/22/14 1734    Psychosocial Evaluation & Interventions   Interventions Encouraged to exercise with the program and follow exercise prescription      Psychosocial Re-Evaluation:   Vocational Rehabilitation: Provide vocational rehab assistance to qualifying  candidates.   Vocational Rehab Evaluation & Intervention:     Vocational Rehab - 12/22/14 1728    Initial Vocational Rehab Evaluation & Intervention   Assessment shows need for Vocational Rehabilitation No      Education: Education Goals: Education classes will be provided on a weekly basis, covering required topics. Participant will state understanding/return demonstration of topics presented.  Learning Barriers/Preferences:     Learning Barriers/Preferences - 12/22/14 1728    Learning Barriers/Preferences   Learning Barriers None   Learning Preferences None      Education Topics: General Nutrition Guidelines/Fats and Fiber: -Group instruction provided by verbal, written material, models and posters to present the general guidelines for heart healthy nutrition. Gives an explanation and review of dietary fats and fiber.   Controlling Sodium/Reading Food Labels: -Group verbal and written material supporting the discussion of  sodium use in heart healthy nutrition. Review and explanation with models, verbal and written materials for utilization of the food label.   Exercise Physiology & Risk Factors: - Group verbal and written instruction with models to review the exercise physiology of the cardiovascular system and associated critical values. Details cardiovascular disease risk factors and the goals associated with each risk factor.   Aerobic Exercise & Resistance Training: - Gives group verbal and written discussion on the health impact of inactivity. On the components of aerobic and resistive training programs and the benefits of this training and how to safely progress through these programs.   Flexibility, Balance, General Exercise Guidelines: - Provides group verbal and written instruction on the benefits of flexibility and balance training programs. Provides general exercise guidelines with specific guidelines to those with heart or lung disease. Demonstration and skill  practice provided.   Stress Management: - Provides group verbal and written instruction about the health risks of elevated stress, cause of high stress, and healthy ways to reduce stress.   Depression: - Provides group verbal and written instruction on the correlation between heart/lung disease and depressed mood, treatment options, and the stigmas associated with seeking treatment.   Anatomy & Physiology of the Heart: - Group verbal and written instruction and models provide basic cardiac anatomy and physiology, with the coronary electrical and arterial systems. Review of: AMI, Angina, Valve disease, Heart Failure, Cardiac Arrhythmia, Pacemakers, and the ICD.   Cardiac Procedures: - Group verbal and written instruction and models to describe the testing methods done to diagnose heart disease. Reviews the outcomes of the test results. Describes the treatment choices: Medical Management, Angioplasty, or Coronary Bypass Surgery.   Cardiac Medications: - Group verbal and written instruction to review commonly prescribed medications for heart disease. Reviews the medication, class of the drug, and side effects. Includes the steps to properly store meds and maintain the prescription regimen.   Go Sex-Intimacy & Heart Disease, Get SMART - Goal Setting: - Group verbal and written instruction through game format to discuss heart disease and the return to sexual intimacy. Provides group verbal and written material to discuss and apply goal setting through the application of the S.M.A.R.T. Method.   Other Matters of the Heart: - Provides group verbal, written materials and models to describe Heart Failure, Angina, Valve Disease, and Diabetes in the realm of heart disease. Includes description of the disease process and treatment options available to the cardiac patient.   Exercise & Equipment Safety: - Individual verbal instruction and demonstration of equipment use and safety with use of the  equipment.   Infection Prevention: - Provides verbal and written material to individual with discussion of infection control including proper hand washing and proper equipment cleaning during exercise session.   Falls Prevention: - Provides verbal and written material to individual with discussion of falls prevention and safety.   Diabetes: - Individual verbal and written instruction to review signs/symptoms of diabetes, desired ranges of glucose level fasting, after meals and with exercise. Advice that pre and post exercise glucose checks will be done for 3 sessions at entry of program.    Knowledge Questionnaire Score:   Personal Goals and Risk Factors at Admission:     Personal Goals and Risk Factors at Admission - 12/22/14 1732    Personal Goals and Risk Factors on Admission    Weight Management Yes   Intervention Learn and follow the exercise and diet guidelines while in the program. Utilize the nutrition and education classes  to help gain knowledge of the diet and exercise expectations in the program   Increase Aerobic Exercise and Physical Activity Yes   Intervention While in program, learn and follow the exercise prescription taught. Start at a low level workload and increase workload after able to maintain previous level for 30 minutes. Increase time before increasing intensity.   Take Less Medication Yes   Intervention Learn your risk factors and begin the lifestyle modifications for risk factor control during your time in the program.   Understand more about Heart/Pulmonary Disease. Yes   Intervention While in program utilize professionals for any questions, and attend the education sessions. Great websites to use are www.americanheart.org or www.lung.org for reliable information.   Diabetes Yes   Goal Blood glucose control identified by blood glucose values, HgbA1C. Participant verbalizes understanding of the signs/symptoms of hyper/hypo glycemia, proper foot care and  importance of medication and nutrition plan for blood glucose control.   Intervention Provide nutrition & aerobic exercise along with prescribed medications to achieve blood glucose in normal ranges: Fasting 65-99 mg/dL      Personal Goals and Risk Factors Review:      Goals and Risk Factor Review      01/06/15 1012           Increase Aerobic Exercise and Physical Activity   Goals Progress/Improvement seen  Yes       Comments Doing well on Elliptical and Treadmill          Personal Goals Discharge:     Comments: 30 day review

## 2015-01-11 DIAGNOSIS — Z951 Presence of aortocoronary bypass graft: Secondary | ICD-10-CM | POA: Diagnosis not present

## 2015-01-11 NOTE — Progress Notes (Signed)
Daily Session Note  Patient Details  Name: Seth Riddle MRN: [id number] Date of Birth: 01/16/1961 Referring Provider:  Thayer Headings, MD  Encounter Date: 01/11/2015  Check In:     Session Check In - 01/11/15 0832    Check-In   Staff Present Heath Lark RN, BSN, CCRP;Jazmarie Biever BS, ACSM EP-C, Exercise Physiologist;Renee Dillard Essex MS, ACSM CEP Exercise Physiologist   ER physicians immediately available to respond to emergencies See telemetry face sheet for immediately available ER MD   Medication changes reported     No   Fall or balance concerns reported    No   Warm-up and Cool-down Performed on first and last piece of equipment   VAD Patient? No   Pain Assessment   Currently in Pain? No/denies         Goals Met:  Proper associated with RPD/PD & O2 Sat Exercise tolerated well No report of cardiac concerns or symptoms Strength training completed today  Goals Unmet:  Not Applicable  Goals Comments:    Dr. Emily Filbert is Medical Director for [place] and [facility].

## 2015-01-12 ENCOUNTER — Other Ambulatory Visit: Payer: Self-pay | Admitting: *Deleted

## 2015-01-12 DIAGNOSIS — Z951 Presence of aortocoronary bypass graft: Secondary | ICD-10-CM

## 2015-01-13 ENCOUNTER — Encounter: Payer: BC Managed Care – PPO | Admitting: *Deleted

## 2015-01-13 DIAGNOSIS — Z951 Presence of aortocoronary bypass graft: Secondary | ICD-10-CM

## 2015-01-13 NOTE — Progress Notes (Signed)
Daily Session Note  Patient Details  Name: Seth Riddle MRN: [id number] Date of Birth: 1960-12-27 Referring Provider:  Thayer Headings, MD  Encounter Date: 01/13/2015  Check In:     Session Check In - 01/13/15 0915    Check-In   Staff Present Heath Lark RN, BSN, CCRP;Renee Dillard [place] MS, ACSM CEP Exercise Physiologist;Carroll Enterkin RN, BSN   ER physicians immediately available to respond to emergencies See telemetry face sheet for immediately available ER MD   Medication changes reported     No   Fall or balance concerns reported    No   Warm-up and Cool-down Performed on first and last piece of equipment   VAD Patient? No   Pain Assessment   Currently in Pain? No/denies         Goals Met:  Independence with exercise equipment Exercise tolerated well No report of cardiac concerns or symptoms Strength training completed today  Goals Unmet:  Not Applicable  Goals Comments:    Dr. Emily Filbert is Medical Director for [place] and [facility].

## 2015-01-17 ENCOUNTER — Other Ambulatory Visit: Payer: Self-pay | Admitting: Physician Assistant

## 2015-01-18 ENCOUNTER — Telehealth: Payer: Self-pay | Admitting: Cardiovascular Disease

## 2015-01-18 ENCOUNTER — Encounter: Payer: BC Managed Care – PPO | Attending: Cardiovascular Disease

## 2015-01-18 DIAGNOSIS — Z951 Presence of aortocoronary bypass graft: Secondary | ICD-10-CM | POA: Insufficient documentation

## 2015-01-18 DIAGNOSIS — I213 ST elevation (STEMI) myocardial infarction of unspecified site: Secondary | ICD-10-CM | POA: Insufficient documentation

## 2015-01-18 NOTE — Telephone Encounter (Signed)
Rec'd from [facility] Cardiac Rehabilitation forward 11 pages to Dr.Nahser

## 2015-01-20 ENCOUNTER — Encounter: Payer: BC Managed Care – PPO | Admitting: *Deleted

## 2015-01-20 DIAGNOSIS — Z951 Presence of aortocoronary bypass graft: Secondary | ICD-10-CM | POA: Diagnosis not present

## 2015-01-20 DIAGNOSIS — I213 ST elevation (STEMI) myocardial infarction of unspecified site: Secondary | ICD-10-CM | POA: Diagnosis present

## 2015-01-20 NOTE — Progress Notes (Signed)
Daily Session Note  Patient Details  Name: Seth Riddle MRN: [id number] Date of Birth: November 27, 1960 Referring Provider:  Thayer Headings, MD  Encounter Date: 01/20/2015  Check In:     Session Check In - 01/20/15 0941    Check-In   Staff Present Heath Lark RN, BSN, CCRP;Carroll Enterkin RN, Drusilla Kanner MS, ACSM CEP Exercise Physiologist   ER physicians immediately available to respond to emergencies See telemetry face sheet for immediately available ER MD   Medication changes reported     No   Fall or balance concerns reported    No   Warm-up and Cool-down Performed on first and last piece of equipment   VAD Patient? No   Pain Assessment   Currently in Pain? Yes   Multiple Pain Sites No           Exercise Prescription Changes - 01/20/15 0900    Exercise Review   Progression Yes   Response to Exercise   Blood Pressure (Admit) 102/72 mmHg   Blood Pressure (Exercise) 140/82 mmHg   Blood Pressure (Exit) 118/60 mmHg   Heart Rate (Admit) 57 bpm   Heart Rate (Exercise) 116 bpm   Heart Rate (Exit) 78 bpm   Rating of Perceived Exertion (Exercise) 13   Resistance Training   Training Prescription Yes   Weight 3   Reps 10-12   Interval Training   Interval Training Yes   Equipment Treadmill   Comments 4.2/0 3 min, 4.5/0 all at 1% incline 30 sec - 1 min and then drop back to 4.2/0 and repeat  Add 1 day of exercise at home   Treadmill   MPH 4.2   Grade 0   Minutes 20   Bike   Level 1.1   Minutes 10   Elliptical   Level 3   Speed 4   Minutes 10      Goals Met:  Independence with exercise equipment Exercise tolerated well Personal goals reviewed No report of cardiac concerns or symptoms Strength training completed today  Goals Unmet:  Not Applicable  Goals Comments:    Dr. Emily Filbert is Medical Director for [place] and [facility].

## 2015-01-23 ENCOUNTER — Encounter: Payer: BC Managed Care – PPO | Admitting: *Deleted

## 2015-01-23 DIAGNOSIS — Z951 Presence of aortocoronary bypass graft: Secondary | ICD-10-CM

## 2015-01-23 LAB — GLUCOSE, CAPILLARY: Glucose-Capillary: 98 mg/dL (ref 65–99)

## 2015-01-23 NOTE — Progress Notes (Signed)
Daily Session Note  Patient Details  Name: Gerry P Raczkowski MRN: [id number] Date of Birth: 06/25/1961 Referring Provider:  Nahser, Philip J, MD  Encounter Date: 01/23/2015  Check In:     Session Check In - 01/23/15 0851    Check-In   Staff Present Susanne Bice RN, BSN, CCRP;Kelly Hayes BS, ACSM CEP Exercise Physiologist;Renee MacMillan MS, ACSM CEP Exercise Physiologist   ER physicians immediately available to respond to emergencies See telemetry face sheet for immediately available ER MD   Medication changes reported     No   Fall or balance concerns reported    No   Warm-up and Cool-down Performed on first and last piece of equipment   VAD Patient? No   Pain Assessment   Currently in Pain? No/denies   Multiple Pain Sites No         Goals Met:  Independence with exercise equipment Exercise tolerated well No report of cardiac concerns or symptoms  Goals Unmet:  Not Applicable  Goals Comments:    Dr. Mark Miller is Medical Director for [facility] and [facility]. 

## 2015-01-25 DIAGNOSIS — Z951 Presence of aortocoronary bypass graft: Secondary | ICD-10-CM | POA: Diagnosis not present

## 2015-01-25 NOTE — Progress Notes (Signed)
Daily Session Note  Patient Details  Name: JERRICO COVELLO MRN: [id number] Date of Birth: Feb 10, 1961 Referring Provider:  Thayer Headings, MD  Encounter Date: 01/25/2015  Check In:     Session Check In - 01/25/15 0859    Check-In   Staff Present Heath Lark RN, BSN, CCRP;Rickia Freeburg BS, ACSM EP-C, Exercise Physiologist;Renee Dillard [place] MS, ACSM CEP Exercise Physiologist   ER physicians immediately available to respond to emergencies See telemetry face sheet for immediately available ER MD   Medication changes reported     No   Fall or balance concerns reported    No   Warm-up and Cool-down Performed on first and last piece of equipment   VAD Patient? No   Pain Assessment   Currently in Pain? No/denies           Exercise Prescription Changes - 01/25/15 0800    Exercise Review   Progression Yes   Resistance Training   Training Prescription Yes   Weight 5   Reps 10-12   Interval Training   Interval Training Yes   Equipment Treadmill   Comments up to 5 mph @ 2%   Elliptical   Level 4   Speed 4   Minutes 10      Goals Met:  Proper associated with RPD/PD & O2 Sat Exercise tolerated well No report of cardiac concerns or symptoms Strength training completed today  Goals Unmet:  Not Applicable  Goals Comments:    Dr. Emily Filbert is Medical Director for [place] and [facility].

## 2015-01-27 ENCOUNTER — Encounter: Payer: BC Managed Care – PPO | Admitting: *Deleted

## 2015-01-27 DIAGNOSIS — Z951 Presence of aortocoronary bypass graft: Secondary | ICD-10-CM | POA: Diagnosis not present

## 2015-01-27 NOTE — Progress Notes (Signed)
Daily Session Note  Patient Details  Name: Seth Riddle MRN: [id number] Date of Birth: October 27, 1960 Referring Provider:  Thayer Headings, MD  Encounter Date: 01/27/2015  Check In:     Session Check In - 01/27/15 0948    Check-In   Staff Present Gerlene Burdock RN, BSN;Keosha Rossa Dillard Essex MS, ACSM CEP Exercise Physiologist;Mary Kellie Shropshire RN   ER physicians immediately available to respond to emergencies See telemetry face sheet for immediately available ER MD   Medication changes reported     No   Fall or balance concerns reported    No   Warm-up and Cool-down Performed on first and last piece of equipment   VAD Patient? No   Pain Assessment   Currently in Pain? Yes   Pain Score 2    Pain Location Hip   Pain Orientation Proximal   Pain Descriptors / Indicators Discomfort   Pain Type Neuropathic pain   Pain Onset Today   Multiple Pain Sites No           Exercise Prescription Changes - 01/27/15 0900    Resistance Training   Training Prescription Yes   Weight 8   Reps 10-12      Goals Met:  Independence with exercise equipment Exercise tolerated well Personal goals reviewed No report of cardiac concerns or symptoms Strength training completed today  Goals Unmet:  Not Applicable  Goals Comments:    Dr. Emily Filbert is Medical Director for [place] and [facility].

## 2015-02-01 ENCOUNTER — Encounter: Payer: Self-pay | Admitting: *Deleted

## 2015-02-01 DIAGNOSIS — Z951 Presence of aortocoronary bypass graft: Secondary | ICD-10-CM

## 2015-02-01 NOTE — Progress Notes (Signed)
Daily Session Note  Patient Details  Name: Seth Riddle MRN: [id number] Date of Birth: 11-25-1960 Referring Provider:  Thayer Headings, MD  Encounter Date: 02/01/2015  Check In:     Session Check In - 02/01/15 0834    Check-In   Staff Present Heath Lark RN, BSN, CCRP;Shanette Tamargo BS, ACSM EP-C, Exercise Physiologist;Renee Dillard [place] MS, ACSM CEP Exercise Physiologist   ER physicians immediately available to respond to emergencies See telemetry face sheet for immediately available ER MD   Medication changes reported     No   Fall or balance concerns reported    No   Warm-up and Cool-down Performed on first and last piece of equipment   VAD Patient? No   Pain Assessment   Currently in Pain? No/denies         Goals Met:  Proper associated with RPD/PD & O2 Sat Exercise tolerated well No report of cardiac concerns or symptoms Strength training completed today  Goals Unmet:  Not Applicable  Goals Comments:    Dr. Emily Filbert is Medical Director for [place] and [facility].

## 2015-02-05 ENCOUNTER — Encounter: Payer: Self-pay | Admitting: *Deleted

## 2015-02-05 NOTE — Patient Instructions (Signed)
Patient Instructions  Patient Details  Name: Seth Riddle MRN: [id number] Date of Birth: 1961/01/24 Referring Provider:  No ref. provider found  Below are the personal goals you chose as well as exercise and nutrition goals. Our goal is to help you keep on track towards obtaining and maintaining your goals. We will be discussing your progress on these goals with you throughout the program.  Initial Exercise Prescription:     Initial Exercise Prescription - 12/22/14 1500    Treadmill   MPH 2.5   Grade 0   Minutes 15   Bike   Level 1   Minutes 10   Recumbant Bike   Level 2   Watts 50   Minutes 10   NuStep   Level 3   Watts 50   Minutes 10   Arm Ergometer   Level 1   Watts 10   Minutes 10   Arm/Foot Ergometer   Level 1   Watts 12   Minutes 10   Cybex   Level 1   RPM 40   Minutes 10   Recumbant Elliptical   Level 1   RPM 50   Watts 30   Minutes 10   Elliptical   Level 1   Speed 3   Minutes 2   REL-XR   Level 3   Watts 50   Minutes 10   Prescription Details   Frequency (times per week) 3   Duration Progress to 30 minutes of continuous aerobic without signs/symptoms of physical distress   Intensity   THRR REST +  30   Ratings of Perceived Exertion 11-15   Perceived Dyspnea 2-4   Progression Continue progressive overload as per policy without signs/symptoms or physical distress.   Resistance Training   Training Prescription Yes   Weight 2   Reps 10-12      Exercise Goals: Frequency: Be able to perform aerobic exercise three times per week working toward 3-5 days per week.  Intensity: Work with a perceived exertion of 11 (fairly light) - 15 (hard) as tolerated. Follow your new exercise prescription and watch for changes in prescription as you progress with the program. Changes will be reviewed with you when they are made.  Duration: You should be able to do 30 minutes of continuous aerobic exercise in addition to a 5 minute warm-up and a 5 minute  cool-down routine.  Nutrition Goals: Your personal nutrition goals will be established when you do your nutrition analysis with the dietician.  The following are nutrition guidelines to follow: Cholesterol < /day Sodium < /day Fiber: Men over 50 yrs - 30 grams per day  Personal Goals:     Personal Goals and Risk Factors at Admission - 12/22/14 1732    Personal Goals and Risk Factors on Admission    Weight Management Yes   Intervention Learn and follow the exercise and diet guidelines while in the program. Utilize the nutrition and education classes to help gain knowledge of the diet and exercise expectations in the program   Increase Aerobic Exercise and Physical Activity Yes   Intervention While in program, learn and follow the exercise prescription taught. Start at a low level workload and increase workload after able to maintain previous level for 30 minutes. Increase time before increasing intensity.   Take Less Medication Yes   Intervention Learn your risk factors and begin the lifestyle modifications for risk factor control during your time in the program.   Understand more about Heart/Pulmonary Disease.  Yes   Intervention While in program utilize professionals for any questions, and attend the education sessions. Great websites to use are www.americanheart.org or www.lung.org for reliable information.   Diabetes Yes   Goal Blood glucose control identified by blood glucose values, HgbA1C. Participant verbalizes understanding of the signs/symptoms of hyper/hypo glycemia, proper foot care and importance of medication and nutrition plan for blood glucose control.   Intervention Provide nutrition & aerobic exercise along with prescribed medications to achieve blood glucose in normal ranges: Fasting 65-99 mg/dL      Tobacco Use Initial Evaluation: History  Smoking status  . Never Smoker   Smokeless tobacco  . Never Used    Copy of goals given to participant.

## 2015-02-05 NOTE — Progress Notes (Unsigned)
Discharge Summary  Patient Details  Name: RAJINDER MESICK MRN: [id number] Date of Birth: 1960-11-26 Referring Provider:  No ref. provider found   Number of Visits: 36   Reason for Discharge:  {CHL AMB CP REHAB REASON FOR DISCHARGE:[phone]}  Smoking History:  History  Smoking status  . Never Smoker   Smokeless tobacco  . Never Used    Diagnosis:  No diagnosis found.  ADL [facility]SD:   Initial Exercise Prescription:     Initial Exercise Prescription - 12/22/14 1500    Treadmill   MPH 2.5   Grade 0   Minutes 15   Bike   Level 1   Minutes 10   Recumbant Bike   Level 2   Watts 50   Minutes 10   NuStep   Level 3   Watts 50   Minutes 10   Arm Ergometer   Level 1   Watts 10   Minutes 10   Arm/Foot Ergometer   Level 1   Watts 12   Minutes 10   Cybex   Level 1   RPM 40   Minutes 10   Recumbant Elliptical   Level 1   RPM 50   Watts 30   Minutes 10   Elliptical   Level 1   Speed 3   Minutes 2   REL-XR   Level 3   Watts 50   Minutes 10   Prescription Details   Frequency (times per week) 3   Duration Progress to 30 minutes of continuous aerobic without signs/symptoms of physical distress   Intensity   THRR REST +  30   Ratings of Perceived Exertion 11-15   Perceived Dyspnea 2-4   Progression Continue progressive overload as per policy without signs/symptoms or physical distress.   Resistance Training   Training Prescription Yes   Weight 2   Reps 10-12      Discharge Exercise Prescription:   Functional Capacity:     6 Minute Walk      12/22/14 1535       6 Minute Walk   Distance 1680 feet     Walk Time 6 minutes     Resting HR 66 bpm     Resting BP 124/64 mmHg     Max Ex. HR 87 bpm     Max Ex. BP 132/74 mmHg     RPE 11     Perceived Dyspnea  1        Psychological, QOL, Others - Outcomes: PHQ 2/9: Depression screen PHQ 2/9 12/22/2014  Decreased Interest 0  Down, Depressed, Hopeless 0  PHQ - 2 Score 0  Altered sleeping 2   Tired, decreased energy 0  Change in appetite 0  Feeling bad or failure about yourself  0  Trouble concentrating 0  Moving slowly or fidgety/restless 0  Suicidal thoughts 0  PHQ-9 Score 2    Quality of Life:     Quality of Life - 12/22/14 1734    Quality of Life Scores   Health/Function Pre 22.25 %   Socioeconomic Pre 23.86 %   Psych/Spiritual Pre 23.5 %   Family Pre 27 %   GLOBAL Pre 23.58 %      Personal Goals: Goals established at orientation with interventions provided to work toward goal.     Personal Goals and Risk Factors at Admission - 12/22/14 1732    Personal Goals and Risk Factors on Admission    Weight Management Yes   Intervention Learn and  follow the exercise and diet guidelines while in the program. Utilize the nutrition and education classes to help gain knowledge of the diet and exercise expectations in the program   Increase Aerobic Exercise and Physical Activity Yes   Intervention While in program, learn and follow the exercise prescription taught. Start at a low level workload and increase workload after able to maintain previous level for 30 minutes. Increase time before increasing intensity.   Take Less Medication Yes   Intervention Learn your risk factors and begin the lifestyle modifications for risk factor control during your time in the program.   Understand more about Heart/Pulmonary Disease. Yes   Intervention While in program utilize professionals for any questions, and attend the education sessions. Great websites to use are www.americanheart.org or www.lung.org for reliable information.   Diabetes Yes   Goal Blood glucose control identified by blood glucose values, HgbA1C. Participant verbalizes understanding of the signs/symptoms of hyper/hypo glycemia, proper foot care and importance of medication and nutrition plan for blood glucose control.   Intervention Provide nutrition & aerobic exercise along with prescribed medications to achieve blood  glucose in normal ranges: Fasting 65-99 mg/dL       Personal Goals Discharge:   Nutrition & Weight - Outcomes:    Nutrition:     Nutrition Therapy & Goals - 02/03/15 1629    Nutrition Therapy   Diet 1800 calorie meal plan for diabetes; DASH diet principles   Drug/Food Interactions Statins/Certain Fruits   Fiber 20 grams   Whole Grain Foods 3 servings   Protein 9 ounces/day   Saturated Fats 12 max. grams   Fruits and Vegetables 5 servings/day   Personal Nutrition Goals   Personal Goal #1 Include breakfast every morning that includes a protein food and at least 2 carbohydrate servings.   Personal Goal #2 Read labels for saturated fat, trans fat and sodium   Personal Goal #3 Read labels for carbohydrate grams. Keep meals in range of 45-60 gms and snacks in range of 15-30 gms.      Nutrition Discharge:     Rate Your Plate - 56/43/32 1632    Rate Your Plate Scores   Pre Score 77   Pre Score % 88 %      Education Questionnaire Score:   Goals reviewed with patient; copy given to patient.

## 2015-02-06 ENCOUNTER — Encounter: Payer: BC Managed Care – PPO | Admitting: *Deleted

## 2015-02-06 ENCOUNTER — Encounter: Payer: Self-pay | Admitting: *Deleted

## 2015-02-06 DIAGNOSIS — Z951 Presence of aortocoronary bypass graft: Secondary | ICD-10-CM

## 2015-02-06 NOTE — Progress Notes (Signed)
Daily Session Note  Patient Details  Name: Seth Riddle MRN: [id number] Date of Birth: Nov 25, 1960 Referring Provider:  Thayer Headings, MD  Encounter Date: 02/06/2015  Check In:     Session Check In - 02/06/15 0830    Check-In   Staff Present Heath Lark RN, BSN, CCRP;Aasim Restivo BS, ACSM CEP Exercise Physiologist;Carroll Enterkin RN, BSN   ER physicians immediately available to respond to emergencies See telemetry face sheet for immediately available ER MD   Medication changes reported     No   Fall or balance concerns reported    No   Warm-up and Cool-down Performed on first and last piece of equipment   VAD Patient? No   Pain Assessment   Currently in Pain? No/denies   Multiple Pain Sites No         Goals Met:  Independence with exercise equipment Exercise tolerated well No report of cardiac concerns or symptoms Strength training completed today  Goals Unmet:  Not Applicable  Goals Comments:    Dr. Emily Filbert is Medical Director for [facility] and [facility].

## 2015-02-07 ENCOUNTER — Encounter: Payer: Self-pay | Admitting: *Deleted

## 2015-02-07 DIAGNOSIS — Z951 Presence of aortocoronary bypass graft: Secondary | ICD-10-CM

## 2015-02-07 NOTE — Progress Notes (Signed)
Cardiac Individual Treatment Plan  Patient Details  Name: Seth Riddle MRN: [id number] Date of Birth: 10/26/1960 Referring Provider: Dr. Joaquim Nam  Initial Encounter Date:  12/22/2014  Visit Diagnosis: S/P CABG (coronary artery bypass graft)  Patient's Home Medications on Admission:  Current outpatient prescriptions:  .  acetaminophen (TYLENOL) 500 MG tablet, Take 500 mg by mouth every 6 (six) hours as needed., Disp: , Rfl:  .  aspirin EC 81 MG tablet, Take 1 tablet (81 mg total) by mouth daily., Disp: 90 tablet, Rfl: 3 .  atorvastatin (LIPITOR) 80 MG tablet, Take 1 tablet (80 mg total) by mouth daily at 6 PM., Disp: 30 tablet, Rfl: 1 .  blood glucose meter kit and supplies KIT, Dispense based on patient and insurance preference. Use up to four times daily as directed. (FOR ICD-9 250.00, 250.01)., Disp: 1 each, Rfl: 0 .  gabapentin (NEURONTIN) 300 MG capsule, Take 1 capsule (300 mg total) by mouth 3 (three) times daily., Disp: 90 capsule, Rfl: 1 .  lisinopril (PRINIVIL,ZESTRIL) 10 MG tablet, Take 1 tablet (10 mg total) by mouth daily., Disp: 30 tablet, Rfl: 1 .  MELATONIN PO, Take 1 tablet by mouth daily as needed (sleep)., Disp: , Rfl:  .  metFORMIN (GLUCOPHAGE) 500 MG tablet, Take 500 mg by mouth 2 (two) times daily with a meal., Disp: , Rfl:  .  metoprolol tartrate (LOPRESSOR) 25 MG tablet, Take 1 tablet (25 mg total) by mouth 2 (two) times daily., Disp: 60 tablet, Rfl: 1 .  oxyCODONE (OXY IR/ROXICODONE) 5 MG immediate release tablet, Take 1 tablet (5 mg total) by mouth 4 (four) times daily as needed for severe pain., Disp: 30 tablet, Rfl: 0  Past Medical History: Past Medical History  Diagnosis Date  . Dyslipidemia (high LDL; low HDL)   . Family history of premature CAD   . Coronary artery disease   . Low HDL (under 40)   . Diabetes mellitus without complication   . MI (myocardial infarction)     Tobacco Use: History  Smoking status  . Never Smoker   Smokeless tobacco   . Never Used    Labs: Recent Review Flowsheet Data    Labs for ITP Cardiac and Pulmonary Rehab Latest Ref Rng 11/16/2014 11/16/2014 11/16/2014 11/16/2014 11/16/2014   PHART 7.350 - 7.450 7.361 7.375 7.312(L) 7.379 -   PCO2ART 35.0 - 45.0 mmHg 40.0 38.3 41.4 38.7 -   HCO3 20.0 - 24.0 mEq/L 22.3 22.1 20.8 22.8 -   TCO2 0 - 100 mmol/L $RemoveBe'23 23 22 24 22   'QvIovzIWA$ ACIDBASEDEF 0.0 - 2.0 mmol/L 2.0 2.0 5.0(H) 2.0 -   O2SAT - 97.0 93.0 91.0 90.0 -       Exercise Target Goals:    Exercise Program Goal: Individual exercise prescription set with THRR, safety & activity barriers. Participant demonstrates ability to understand and report RPE using BORG scale, to self-measure pulse accurately, and to acknowledge the importance of the exercise prescription.  Exercise Prescription Goal: Starting with aerobic activity 30 plus minutes a day, 3 days per week for initial exercise prescription. Provide home exercise prescription and guidelines that participant acknowledges understanding prior to discharge.  Activity Barriers & Risk Stratification:     Activity Barriers & Risk Stratification - 12/22/14 1728    Activity Barriers & Risk Stratification   Activity Barriers None   Risk Stratification High      6 Minute Walk:     6 Minute Walk      12/22/14 1535  6 Minute Walk   Distance 1680 feet     Walk Time 6 minutes     Resting HR 66 bpm     Resting BP 124/64 mmHg     Max Ex. HR 87 bpm     Max Ex. BP 132/74 mmHg     RPE 11     Perceived Dyspnea  1        Initial Exercise Prescription:     Initial Exercise Prescription - 12/22/14 1500    Treadmill   MPH 2.5   Grade 0   Minutes 15   Bike   Level 1   Minutes 10   Recumbant Bike   Level 2   Watts 50   Minutes 10   NuStep   Level 3   Watts 50   Minutes 10   Arm Ergometer   Level 1   Watts 10   Minutes 10   Arm/Foot Ergometer   Level 1   Watts 12   Minutes 10   Cybex   Level 1   RPM 40   Minutes 10   Recumbant  Elliptical   Level 1   RPM 50   Watts 30   Minutes 10   Elliptical   Level 1   Speed 3   Minutes 2   REL-XR   Level 3   Watts 50   Minutes 10   Prescription Details   Frequency (times per week) 3   Duration Progress to 30 minutes of continuous aerobic without signs/symptoms of physical distress   Intensity   THRR REST +  30   Ratings of Perceived Exertion 11-15   Perceived Dyspnea 2-4   Progression Continue progressive overload as per policy without signs/symptoms or physical distress.   Resistance Training   Training Prescription Yes   Weight 2   Reps 10-12      Exercise Prescription Changes:     Exercise Prescription Changes      12/30/14 0900 01/09/15 1300 01/10/15 0600 01/20/15 0900 01/25/15 0800   Exercise Review   Progression  Yes Yes Yes Yes   Response to Exercise   Blood Pressure (Admit)  102/72 mmHg 102/72 mmHg 102/72 mmHg    Blood Pressure (Exercise)  140/82 mmHg 140/82 mmHg 140/82 mmHg    Blood Pressure (Exit)  118/60 mmHg 118/60 mmHg 118/60 mmHg    Heart Rate (Admit)  57 bpm 57 bpm 57 bpm    Heart Rate (Exercise)  116 bpm 116 bpm 116 bpm    Heart Rate (Exit)  78 bpm 78 bpm 78 bpm    Rating of Perceived Exertion (Exercise)  $RemoveBef'13 13 13    'JLYHsUJPbY$ Resistance Training   Training Prescription   Yes Yes Yes   Weight   '3 3 5   '$ Reps   10-12 10-12 10-12   Interval Training   Interval Training   Yes Yes Yes   Equipment   Treadmill Treadmill Treadmill   Comments   4.2/0 3 min, 4.5/0 30 sec - 1 min and then drop back to 4.2/0 and repeat 4.2/0 3 min, 4.5/0 all at 1% incline 30 sec - 1 min and then drop back to 4.2/0 and repeat  Add 1 day of exercise at home up to 5 mph @ 2%   Treadmill   MPH 4 4.2 4.2 4.2    Grade 0 0 0 0    Minutes  $Remove'20 20 20    'UZOEIen$ Bike   Level  1.1 1.1 1.1  Minutes  $Remove'10 10 10    'UvfDbCc$ Elliptical   Level  $Remo'3 3 3 4   'NMLfv$ Speed  $Remo'4 4 4 4   'Whlox$ Minutes  $Remove'10 10 10 10     'unJCcTw$ 01/27/15 0900 02/06/15 1700         Exercise Review   Progression  Yes      Response to  Exercise   Blood Pressure (Admit)  130/70 mmHg      Blood Pressure (Exercise)  144/80 mmHg      Blood Pressure (Exit)  106/62 mmHg      Heart Rate (Admit)  71 bpm      Heart Rate (Exercise)  101 bpm      Heart Rate (Exit)  89 bpm      Rating of Perceived Exertion (Exercise)  15      Symptoms  No      Frequency  Add 1 additional day to program exercise sessions.      Duration  Progress to 50 minutes of aerobic without signs/symptoms of physical distress      Intensity  Rest + 30      Progression  Continue progressive overload as per policy without signs/symptoms or physical distress.      Resistance Training   Training Prescription Yes Yes      Weight 8 8      Reps 10-12 10-15      Interval Training   Interval Training  Yes      Equipment  Treadmill      Comments  4.2/2 3 min, 5.2/2 all at 1% incline 30 sec - 1 min and then drop back to 4.2/0 and repeat  Add 1 day of exercise at home      Treadmill   MPH  4.2      Grade  2      Minutes  20      Bike   Level  1.1      Minutes  15      Elliptical   Level  5      Speed  4      Minutes  15         Discharge Exercise Prescription (Final Exercise Prescription Changes):     Exercise Prescription Changes - 02/06/15 1700    Exercise Review   Progression Yes   Response to Exercise   Blood Pressure (Admit) 130/70 mmHg   Blood Pressure (Exercise) 144/80 mmHg   Blood Pressure (Exit) 106/62 mmHg   Heart Rate (Admit) 71 bpm   Heart Rate (Exercise) 101 bpm   Heart Rate (Exit) 89 bpm   Rating of Perceived Exertion (Exercise) 15   Symptoms No   Frequency Add 1 additional day to program exercise sessions.   Duration Progress to 50 minutes of aerobic without signs/symptoms of physical distress   Intensity Rest + 30   Progression Continue progressive overload as per policy without signs/symptoms or physical distress.   Resistance Training   Training Prescription Yes   Weight 8   Reps 10-15   Interval Training   Interval Training  Yes   Equipment Treadmill   Comments 4.2/2 3 min, 5.2/2 all at 1% incline 30 sec - 1 min and then drop back to 4.2/0 and repeat  Add 1 day of exercise at home   Treadmill   MPH 4.2   Grade 2   Minutes 20   Bike   Level 1.1   Minutes 15   Elliptical  Level 5   Speed 4   Minutes 15      Nutrition:  Target Goals: Understanding of nutrition guidelines, daily intake of sodium '1500mg'$ , cholesterol '200mg'$ , calories 30% from fat and 7% or less from saturated fats, daily to have 5 or more servings of fruits and vegetables.  Biometrics:    Nutrition Therapy Plan and Nutrition Goals:     Nutrition Therapy & Goals - 02/03/15 1629    Nutrition Therapy   Diet 1800 calorie meal plan for diabetes; DASH diet principles   Drug/Food Interactions Statins/Certain Fruits   Fiber 20 grams   Whole Grain Foods 3 servings   Protein 9 ounces/day   Saturated Fats 12 max. grams   Fruits and Vegetables 5 servings/day   Personal Nutrition Goals   Personal Goal #1 Include breakfast every morning that includes a protein food and at least 2 carbohydrate servings.   Personal Goal #2 Read labels for saturated fat, trans fat and sodium   Personal Goal #3 Read labels for carbohydrate grams. Keep meals in range of 45-60 gms and snacks in range of 15-30 gms.      Nutrition Discharge: Rate Your Plate Scores:     Rate Your Plate - 12/27/00 1117    Rate Your Plate Scores   Pre Score 77   Pre Score % 88 %      Nutrition Goals Re-Evaluation:     Nutrition Goals Re-Evaluation      01/06/15 1011 02/01/15 1117 02/07/15 1048       Personal Goal #1 Re-Evaluation   Personal Goal #1 Is eating healtier Working on healthy eating habits Include breakfast every morning that includes a protein food and at least 2 carbohydrate servings.     Goal Progress Seen Yes Yes Yes     Comments  Continues to work on healthy eating.  No sugar being used since left hospital Continues to work on healthy eating.  No sugar  being used since left hospital     Personal Goal #2 Re-Evaluation   Personal Goal #2   Read labels for saturated fat, trans fat and sodium     Personal Goal #3 Re-Evaluation   Personal Goal #3   Read labels for carbohydrate grams. Keep meals in range of 45-60 gms and snacks in range of 15-30 gms.        Psychosocial: Target Goals: Acknowledge presence or absence of depression, maximize coping skills, provide positive support system. Participant is able to verbalize types and ability to use techniques and skills needed for reducing stress and depression.  Initial Review & Psychosocial Screening:     Initial Psych Review & Screening - 12/22/14 [place]? Yes   Barriers   Psychosocial barriers to participate in program There are no identifiable barriers or psychosocial needs.   Screening Interventions   Interventions Encouraged to exercise      Quality of Life Scores:     Quality of Life - 02/07/15 1049    Quality of Life Scores   Health/Function Pre 22.25 %   Socioeconomic Pre 23.86 %   Psych/Spiritual Pre 23.5 %   Family Pre 27 %   GLOBAL Pre 23.58 %      PHQ-9:     Recent Review Flowsheet Data    Depression screen [facility] 2/9 12/22/2014   Decreased Interest 0   Down, Depressed, Hopeless 0   PHQ - 2 Score 0   Altered sleeping 2  Tired, decreased energy 0   Change in appetite 0   Feeling bad or failure about yourself  0   Trouble concentrating 0   Moving slowly or fidgety/restless 0   Suicidal thoughts 0   PHQ-9 Score 2      Psychosocial Evaluation and Intervention:     Psychosocial Evaluation - 12/22/14 1734    Psychosocial Evaluation & Interventions   Interventions Encouraged to exercise with the program and follow exercise prescription      Psychosocial Re-Evaluation:   Vocational Rehabilitation: Provide vocational rehab assistance to qualifying candidates.   Vocational Rehab Evaluation & Intervention:      Vocational Rehab - 12/22/14 1728    Initial Vocational Rehab Evaluation & Intervention   Assessment shows need for Vocational Rehabilitation No      Education: Education Goals: Education classes will be provided on a weekly basis, covering required topics. Participant will state understanding/return demonstration of topics presented.  Learning Barriers/Preferences:     Learning Barriers/Preferences - 12/22/14 1728    Learning Barriers/Preferences   Learning Barriers None   Learning Preferences None      Education Topics: General Nutrition Guidelines/Fats and Fiber: -Group instruction provided by verbal, written material, models and posters to present the general guidelines for heart healthy nutrition. Gives an explanation and review of dietary fats and fiber.   Controlling Sodium/Reading Food Labels: -Group verbal and written material supporting the discussion of sodium use in heart healthy nutrition. Review and explanation with models, verbal and written materials for utilization of the food label.   Exercise Physiology & Risk Factors: - Group verbal and written instruction with models to review the exercise physiology of the cardiovascular system and associated critical values. Details cardiovascular disease risk factors and the goals associated with each risk factor.   Aerobic Exercise & Resistance Training: - Gives group verbal and written discussion on the health impact of inactivity. On the components of aerobic and resistive training programs and the benefits of this training and how to safely progress through these programs.   Flexibility, Balance, General Exercise Guidelines: - Provides group verbal and written instruction on the benefits of flexibility and balance training programs. Provides general exercise guidelines with specific guidelines to those with heart or lung disease. Demonstration and skill practice provided.   Stress Management: - Provides group  verbal and written instruction about the health risks of elevated stress, cause of high stress, and healthy ways to reduce stress.   Depression: - Provides group verbal and written instruction on the correlation between heart/lung disease and depressed mood, treatment options, and the stigmas associated with seeking treatment.   Anatomy & Physiology of the Heart: - Group verbal and written instruction and models provide basic cardiac anatomy and physiology, with the coronary electrical and arterial systems. Review of: AMI, Angina, Valve disease, Heart Failure, Cardiac Arrhythmia, Pacemakers, and the ICD.   Cardiac Procedures: - Group verbal and written instruction and models to describe the testing methods done to diagnose heart disease. Reviews the outcomes of the test results. Describes the treatment choices: Medical Management, Angioplasty, or Coronary Bypass Surgery.   Cardiac Medications: - Group verbal and written instruction to review commonly prescribed medications for heart disease. Reviews the medication, class of the drug, and side effects. Includes the steps to properly store meds and maintain the prescription regimen.   Go Sex-Intimacy & Heart Disease, Get SMART - Goal Setting: - Group verbal and written instruction through game format to discuss heart disease and the return to  sexual intimacy. Provides group verbal and written material to discuss and apply goal setting through the application of the S.M.A.R.T. Method.   Other Matters of the Heart: - Provides group verbal, written materials and models to describe Heart Failure, Angina, Valve Disease, and Diabetes in the realm of heart disease. Includes description of the disease process and treatment options available to the cardiac patient.   Exercise & Equipment Safety: - Individual verbal instruction and demonstration of equipment use and safety with use of the equipment.   Infection Prevention: - Provides verbal and  written material to individual with discussion of infection control including proper hand washing and proper equipment cleaning during exercise session.   Falls Prevention: - Provides verbal and written material to individual with discussion of falls prevention and safety.   Diabetes: - Individual verbal and written instruction to review signs/symptoms of diabetes, desired ranges of glucose level fasting, after meals and with exercise. Advice that pre and post exercise glucose checks will be done for 3 sessions at entry of program.    Knowledge Questionnaire Score:   Personal Goals and Risk Factors at Admission:     Personal Goals and Risk Factors at Admission - 12/22/14 1732    Personal Goals and Risk Factors on Admission    Weight Management Yes   Intervention Learn and follow the exercise and diet guidelines while in the program. Utilize the nutrition and education classes to help gain knowledge of the diet and exercise expectations in the program   Increase Aerobic Exercise and Physical Activity Yes   Intervention While in program, learn and follow the exercise prescription taught. Start at a low level workload and increase workload after able to maintain previous level for 30 minutes. Increase time before increasing intensity.   Take Less Medication Yes   Intervention Learn your risk factors and begin the lifestyle modifications for risk factor control during your time in the program.   Understand more about Heart/Pulmonary Disease. Yes   Intervention While in program utilize professionals for any questions, and attend the education sessions. Great websites to use are www.americanheart.org or www.lung.org for reliable information.   Diabetes Yes   Goal Blood glucose control identified by blood glucose values, HgbA1C. Participant verbalizes understanding of the signs/symptoms of hyper/hypo glycemia, proper foot care and importance of medication and nutrition plan for blood glucose  control.   Intervention Provide nutrition & aerobic exercise along with prescribed medications to achieve blood glucose in normal ranges: Fasting 65-99 mg/dL      Personal Goals and Risk Factors Review:      Goals and Risk Factor Review      01/06/15 1012 02/01/15 1119 02/07/15 1048       Weight Management   Goals Progress/Improvement seen  Yes Yes     Comments  Weight is down since admission to program Weight is down since admission to program     Increase Aerobic Exercise and Physical Activity   Goals Progress/Improvement seen  Yes Yes Yes     Comments Doing well on Elliptical and Treadmill Doing well with progression and interval training Doing well with progression and interval training     Take Less Medication   Goals Progress/Improvement seen  No No     Comments  Understands this is a long term goal,to continue to pursue goal by maintianing the guidelines for nutrition and exerxise learned in program. Understands this is a long term goal,to continue to pursue goal by maintianing the guidelines for nutrition and  exerxise learned in program.     Understand more about Heart/Pulmonary Disease   Goals Progress/Improvement seen   Yes Yes     Comments  Attending classes and asking questions as needed. Attending classes and asking questions as needed.     Diabetes   Goal   Blood glucose control identified by blood glucose values, HgbA1C. Participant verbalizes understanding of the signs/symptoms of hyper/hypo glycemia, proper foot care and importance of medication and nutrition plan for blood glucose control.     Progress seen towards goals  Yes Yes     Comments  Blood sugar control is improved since nutrition changes and exercise regimen. Blood sugar control is improved since nutrition changes and exercise regimen.        Personal Goals Discharge:     Comments: 30 day review. Continue with ITP.

## 2015-02-08 ENCOUNTER — Other Ambulatory Visit: Payer: Self-pay | Admitting: *Deleted

## 2015-02-08 DIAGNOSIS — Z951 Presence of aortocoronary bypass graft: Secondary | ICD-10-CM

## 2015-02-15 DIAGNOSIS — Z951 Presence of aortocoronary bypass graft: Secondary | ICD-10-CM

## 2015-02-15 NOTE — Progress Notes (Signed)
Daily Session Note  Patient Details  Name: Seth Riddle MRN: [id number] Date of Birth: 03-Aug-1961 Referring Provider:  Thayer Headings, MD  Encounter Date: 02/15/2015  Check In:     Session Check In - 02/15/15 0834    Check-In   Staff Present Heath Lark RN, BSN, CCRP;Talecia Sherlin BS, ACSM EP-C, Exercise Physiologist;Renee Dillard [place] MS, ACSM CEP Exercise Physiologist   ER physicians immediately available to respond to emergencies See telemetry face sheet for immediately available ER MD   Medication changes reported     No   Fall or balance concerns reported    No   Warm-up and Cool-down Performed on first and last piece of equipment   VAD Patient? No   Pain Assessment   Currently in Pain? No/denies         Goals Met:  Proper associated with RPD/PD & O2 Sat Exercise tolerated well No report of cardiac concerns or symptoms Strength training completed today  Goals Unmet:  Not Applicable  Goals Comments:    Dr. Emily Filbert is Medical Director for [place] and [facility].

## 2015-02-17 ENCOUNTER — Encounter: Payer: BC Managed Care – PPO | Attending: Cardiovascular Disease | Admitting: *Deleted

## 2015-02-17 DIAGNOSIS — I213 ST elevation (STEMI) myocardial infarction of unspecified site: Secondary | ICD-10-CM | POA: Insufficient documentation

## 2015-02-17 DIAGNOSIS — Z951 Presence of aortocoronary bypass graft: Secondary | ICD-10-CM | POA: Insufficient documentation

## 2015-02-17 NOTE — Progress Notes (Signed)
Daily Session Note  Patient Details  Name: CURRY SEEFELDT MRN: [id number] Date of Birth: 1961/02/25 Referring Provider:  Thayer Headings, MD  Encounter Date: 02/17/2015  Check In:     Session Check In - 02/17/15 0906    Check-In   Staff Present Heath Lark RN, BSN, CCRP;Renee Dillard [place] MS, ACSM CEP Exercise Physiologist;Carroll Enterkin RN, BSN   ER physicians immediately available to respond to emergencies See telemetry face sheet for immediately available ER MD   Medication changes reported     No   Fall or balance concerns reported    No   Warm-up and Cool-down Performed on first and last piece of equipment   VAD Patient? No   Pain Assessment   Currently in Pain? No/denies         Goals Met:  Independence with exercise equipment Exercise tolerated well No report of cardiac concerns or symptoms Strength training completed today  Goals Unmet:  Not Applicable  Goals Comments: [name] continues to work with the interval training and exercise progression.    Dr. Emily Filbert is Medical Director for [place] and [facility].

## 2015-02-21 ENCOUNTER — Encounter: Payer: Self-pay | Admitting: *Deleted

## 2015-03-07 ENCOUNTER — Encounter: Payer: Self-pay | Admitting: *Deleted

## 2015-03-07 DIAGNOSIS — Z951 Presence of aortocoronary bypass graft: Secondary | ICD-10-CM

## 2015-03-07 NOTE — Progress Notes (Signed)
Cardiac Individual Treatment Plan  Patient Details  Name: Seth Riddle MRN: [id number] Date of Birth: [birth year] Referring Provider:  Thayer Headings, MD  Initial Encounter Date:  12/22/2014  Visit Diagnosis: S/P CABG (coronary artery bypass graft)  Patient's Home Medications on Admission:  Current outpatient prescriptions:  .  acetaminophen (TYLENOL) 500 MG tablet, Take 500 mg by mouth every 6 (six) hours as needed., Disp: , Rfl:  .  aspirin EC 81 MG tablet, Take 1 tablet (81 mg total) by mouth daily., Disp: 90 tablet, Rfl: 3 .  atorvastatin (LIPITOR) 80 MG tablet, Take 1 tablet (80 mg total) by mouth daily at 6 PM., Disp: 30 tablet, Rfl: 1 .  blood glucose meter kit and supplies KIT, Dispense based on patient and insurance preference. Use up to four times daily as directed. (FOR ICD-9 250.00, 250.01)., Disp: 1 each, Rfl: 0 .  gabapentin (NEURONTIN) 300 MG capsule, Take 1 capsule (300 mg total) by mouth 3 (three) times daily., Disp: 90 capsule, Rfl: 1 .  lisinopril (PRINIVIL,ZESTRIL) 10 MG tablet, Take 1 tablet (10 mg total) by mouth daily., Disp: 30 tablet, Rfl: 1 .  MELATONIN PO, Take 1 tablet by mouth daily as needed (sleep)., Disp: , Rfl:  .  metFORMIN (GLUCOPHAGE) 500 MG tablet, Take 500 mg by mouth 2 (two) times daily with a meal., Disp: , Rfl:  .  metoprolol tartrate (LOPRESSOR) 25 MG tablet, Take 1 tablet (25 mg total) by mouth 2 (two) times daily., Disp: 60 tablet, Rfl: 1 .  oxyCODONE (OXY IR/ROXICODONE) 5 MG immediate release tablet, Take 1 tablet (5 mg total) by mouth 4 (four) times daily as needed for severe pain., Disp: 30 tablet, Rfl: 0  Past Medical History: Past Medical History  Diagnosis Date  . Dyslipidemia (high LDL; low HDL)   . Family history of premature CAD   . Coronary artery disease   . Low HDL (under 40)   . Diabetes mellitus without complication   . MI (myocardial infarction)     Tobacco Use: History  Smoking status  . Never Smoker   Smokeless  tobacco  . Never Used    Labs: Recent Review Flowsheet Data    Labs for ITP Cardiac and Pulmonary Rehab Latest Ref Rng 11/16/2014 11/16/2014 11/16/2014 11/16/2014 11/16/2014   PHART 7.350 - 7.450 7.361 7.375 7.312(L) 7.379 -   PCO2ART 35.0 - 45.0 mmHg 40.0 38.3 41.4 38.7 -   HCO3 20.0 - 24.0 mEq/L 22.3 22.1 20.8 22.8 -   TCO2 0 - 100 mmol/L _0 ACIDBASEDEF 0.0 - 2.0 mmol/L 2.0 2.0 5.0(H) 2.0 -   O2SAT - 97.0 93.0 91.0 90.0 -       Exercise Target Goals:    Exercise Program Goal: Individual exercise prescription set with THRR, safety & activity barriers. Participant demonstrates ability to understand and report RPE using BORG scale, to self-measure pulse accurately, and to acknowledge the importance of the exercise prescription.  Exercise Prescription Goal: Starting with aerobic activity 30 plus minutes a day, 3 days per week for initial exercise prescription. Provide home exercise prescription and guidelines that participant acknowledges understanding prior to discharge.  Activity Barriers & Risk Stratification:     Activity Barriers & Risk Stratification - 12/22/14 1728    Activity Barriers & Risk Stratification   Activity Barriers None   Risk Stratification High      6 Minute Walk:     6 Minute Walk  12/22/14 1535       6 Minute Walk   Distance 1680 feet     Walk Time 6 minutes     Resting HR 66 bpm     Resting BP 124/64 mmHg     Max Ex. HR 87 bpm     Max Ex. BP 132/74 mmHg     RPE 11     Perceived Dyspnea  1        Initial Exercise Prescription:     Initial Exercise Prescription - 12/22/14 1500    Treadmill   MPH 2.5   Grade 0   Minutes 15   Bike   Level 1   Minutes 10   Recumbant Bike   Level 2   Watts 50   Minutes 10   NuStep   Level 3   Watts 50   Minutes 10   Arm Ergometer   Level 1   Watts 10   Minutes 10   Arm/Foot Ergometer   Level 1   Watts 12   Minutes 10   Cybex   Level 1   RPM 40   Minutes 10   Recumbant  Elliptical   Level 1   RPM 50   Watts 30   Minutes 10   Elliptical   Level 1   Speed 3   Minutes 2   REL-XR   Level 3   Watts 50   Minutes 10   Prescription Details   Frequency (times per week) 3   Duration Progress to 30 minutes of continuous aerobic without signs/symptoms of physical distress   Intensity   THRR REST +  30   Ratings of Perceived Exertion 11-15   Perceived Dyspnea 2-4   Progression Continue progressive overload as per policy without signs/symptoms or physical distress.   Resistance Training   Training Prescription Yes   Weight 2   Reps 10-12      Exercise Prescription Changes:     Exercise Prescription Changes      12/30/14 0900 01/09/15 1300 01/10/15 0600 01/20/15 0900 01/25/15 0800   Exercise Review   Progression  Yes Yes Yes Yes   Response to Exercise   Blood Pressure (Admit)  102/72 mmHg 102/72 mmHg 102/72 mmHg    Blood Pressure (Exercise)  140/82 mmHg 140/82 mmHg 140/82 mmHg    Blood Pressure (Exit)  118/60 mmHg 118/60 mmHg 118/60 mmHg    Heart Rate (Admit)  57 bpm 57 bpm 57 bpm    Heart Rate (Exercise)  116 bpm 116 bpm 116 bpm    Heart Rate (Exit)  78 bpm 78 bpm 78 bpm    Rating of Perceived Exertion (Exercise)  _0 Resistance Training   Training Prescription   Yes Yes Yes   Weight   _1 Reps   10-12 10-12 10-12   Interval Training   Interval Training   Yes Yes Yes   Equipment   Treadmill Treadmill Treadmill   Comments   4.2/0 3 min, 4.5/0 30 sec - 1 min and then drop back to 4.2/0 and repeat 4.2/0 3 min, 4.5/0 all at 1% incline 30 sec - 1 min and then drop back to 4.2/0 and repeat  Add 1 day of exercise at home up to 5 mph @ 2%   Treadmill   MPH 4 4.2 4.2 4.2    Grade 0 0 0 0    Minutes  20 20 20  Bike   Level  1.1 1.1 1.1    Minutes  _0 Elliptical   Level  _1 Speed  _2 Minutes  _3 01/27/15 0900 02/06/15 1700 02/21/15 0700       Exercise Review   Progression  Yes Yes      Response to Exercise   Blood Pressure (Admit)  130/70 mmHg 102/70 mmHg     Blood Pressure (Exercise)  144/80 mmHg 140/74 mmHg     Blood Pressure (Exit)  106/62 mmHg 118/72 mmHg     Heart Rate (Admit)  71 bpm 78 bpm     Heart Rate (Exercise)  101 bpm 116 bpm     Heart Rate (Exit)  89 bpm 73 bpm     Rating of Perceived Exertion (Exercise)  15 15     Symptoms  No No     Frequency  Add 1 additional day to program exercise sessions. Add 2 additional days to program exercise sessions.     Duration  Progress to 50 minutes of aerobic without signs/symptoms of physical distress Progress to 50 minutes of aerobic without signs/symptoms of physical distress     Intensity  Rest + 30 THRR New  115-157 (40-85% HRR)     Progression  Continue progressive overload as per policy without signs/symptoms or physical distress. Continue progressive overload as per policy without signs/symptoms or physical distress.     Resistance Training   Training Prescription Yes Yes Yes     Weight _4 Reps 10-12 10-15 10-15     Interval Training   Interval Training  Yes Yes     Equipment  Treadmill Treadmill     Comments  4.2/2 3 min, 5.2/2 all at 1% incline 30 sec - 1 min and then drop back to 4.2/0 and repeat  Add 1 day of exercise at home 4.2/2 3 min, 5.2/2 all at 1% incline 30 sec - 1 min and then drop back to 4.2/0 and repeat  Add 1 day of exercise at home     Treadmill   MPH  4.2 5.2     Grade  2 2     Minutes  20 20     Bike   Level  1.1 1.1     Minutes  15 15     Elliptical   Level  5 6     Speed  4 4     Minutes  15 15        Discharge Exercise Prescription (Final Exercise Prescription Changes):     Exercise Prescription Changes - 02/21/15 0700    Exercise Review   Progression Yes   Response to Exercise   Blood Pressure (Admit) 102/70 mmHg   Blood Pressure (Exercise) 140/74 mmHg   Blood Pressure (Exit) 118/72 mmHg   Heart Rate (Admit) 78 bpm   Heart Rate (Exercise) 116 bpm   Heart Rate  (Exit) 73 bpm   Rating of Perceived Exertion (Exercise) 15   Symptoms No   Frequency Add 2 additional days to program exercise sessions.   Duration Progress to 50 minutes of aerobic without signs/symptoms of physical distress   Intensity THRR New  115-157 (40-85% HRR)   Progression Continue progressive overload as per policy without signs/symptoms or physical distress.   Resistance Training   Training Prescription Yes   Weight 10  Reps 10-15   Interval Training   Interval Training Yes   Equipment Treadmill   Comments 4.2/2 3 min, 5.2/2 all at 1% incline 30 sec - 1 min and then drop back to 4.2/0 and repeat  Add 1 day of exercise at home   Treadmill   MPH 5.2   Grade 2   Minutes 20   Bike   Level 1.1   Minutes 15   Elliptical   Level 6   Speed 4   Minutes 15      Nutrition:  Target Goals: Understanding of nutrition guidelines, daily intake of sodium <153m, cholesterol <2070m calories 30% from fat and 7% or less from saturated fats, daily to have 5 or more servings of fruits and vegetables.  Biometrics:    Nutrition Therapy Plan and Nutrition Goals:     Nutrition Therapy & Goals - 02/03/15 1629    Nutrition Therapy   Diet 1800 calorie meal plan for diabetes; DASH diet principles   Drug/Food Interactions Statins/Certain Fruits   Fiber 20 grams   Whole Grain Foods 3 servings   Protein 9 ounces/day   Saturated Fats 12 max. grams   Fruits and Vegetables 5 servings/day   Personal Nutrition Goals   Personal Goal #1 Include breakfast every morning that includes a protein food and at least 2 carbohydrate servings.   Personal Goal #2 Read labels for saturated fat, trans fat and sodium   Personal Goal #3 Read labels for carbohydrate grams. Keep meals in range of 45-60 gms and snacks in range of 15-30 gms.      Nutrition Discharge: Rate Your Plate Scores:     Rate Your Plate - 0673/22/0205427  Rate Your Plate Scores   Pre Score 77   Pre Score % 88 %       Nutrition Goals Re-Evaluation:     Nutrition Goals Re-Evaluation      01/06/15 1011 02/01/15 1117 02/07/15 1048 03/07/15 1309     Personal Goal #1 Re-Evaluation   Personal Goal #1 Is eating healtier Working on healthy eating habits Include breakfast every morning that includes a protein food and at least 2 carbohydrate servings. Include breakfast every morning that includes a protein food and at least 2 carbohydrate servings.    Goal Progress Seen Yes Yes Yes     Comments  Continues to work on healthy eating.  No sugar being used since left hospital Continues to work on healthy eating.  No sugar being used since left hospital Continues to work on healthy eating.  No sugar being used since left hospital    Personal Goal #2 Re-Evaluation   Personal Goal #2   Read labels for saturated fat, trans fat and sodium Read labels for saturated fat, trans fat and sodium    Goal Progress Seen    Yes    Comments    Doing best to read labels    Personal Goal #3 Re-Evaluation   Personal Goal #3   Read labels for carbohydrate grams. Keep meals in range of 45-60 gms and snacks in range of 15-30 gms. Read labels for carbohydrate grams. Keep meals in range of 45-60 gms and snacks in range of 15-30 gms.    Goal Progress Seen    Yes    Comments    Doing best to read labels       Psychosocial: Target Goals: Acknowledge presence or absence of depression, maximize coping skills, provide positive support system. Participant is able to  verbalize types and ability to use techniques and skills needed for reducing stress and depression.  Initial Review & Psychosocial Screening:     Initial Psych Review & Screening - 12/22/14 Theresa? Yes   Barriers   Psychosocial barriers to participate in program There are no identifiable barriers or psychosocial needs.   Screening Interventions   Interventions Encouraged to exercise      Quality of Life Scores:     Quality of  Life - 02/07/15 1049    Quality of Life Scores   Health/Function Pre 22.25 %   Socioeconomic Pre 23.86 %   Psych/Spiritual Pre 23.5 %   Family Pre 27 %   GLOBAL Pre 23.58 %      PHQ-9:     Recent Review Flowsheet Data    Depression screen [facility] 2/9 12/22/2014   Decreased Interest 0   Down, Depressed, Hopeless 0   PHQ - 2 Score 0   Altered sleeping 2   Tired, decreased energy 0   Change in appetite 0   Feeling bad or failure about yourself  0   Trouble concentrating 0   Moving slowly or fidgety/restless 0   Suicidal thoughts 0   PHQ-9 Score 2      Psychosocial Evaluation and Intervention:     Psychosocial Evaluation - 12/22/14 1734    Psychosocial Evaluation & Interventions   Interventions Encouraged to exercise with the program and follow exercise prescription      Psychosocial Re-Evaluation:   Vocational Rehabilitation: Provide vocational rehab assistance to qualifying candidates.   Vocational Rehab Evaluation & Intervention:     Vocational Rehab - 12/22/14 1728    Initial Vocational Rehab Evaluation & Intervention   Assessment shows need for Vocational Rehabilitation No      Education: Education Goals: Education classes will be provided on a weekly basis, covering required topics. Participant will state understanding/return demonstration of topics presented.  Learning Barriers/Preferences:     Learning Barriers/Preferences - 12/22/14 1728    Learning Barriers/Preferences   Learning Barriers None   Learning Preferences None      Education Topics: General Nutrition Guidelines/Fats and Fiber: -Group instruction provided by verbal, written material, models and posters to present the general guidelines for heart healthy nutrition. Gives an explanation and review of dietary fats and fiber.          Cardiac Rehab from 02/15/2015 in [facility] Cardiac Rehab   Date  02/06/15   Educator  CR   Instruction Review Code  2- meets goals/outcomes      Controlling  Sodium/Reading Food Labels: -Group verbal and written material supporting the discussion of sodium use in heart healthy nutrition. Review and explanation with models, verbal and written materials for utilization of the food label.   Exercise Physiology & Risk Factors: - Group verbal and written instruction with models to review the exercise physiology of the cardiovascular system and associated critical values. Details cardiovascular disease risk factors and the goals associated with each risk factor.   Aerobic Exercise & Resistance Training: - Gives group verbal and written discussion on the health impact of inactivity. On the components of aerobic and resistive training programs and the benefits of this training and how to safely progress through these programs.      Cardiac Rehab from 01/09/2015 in [facility] Cardiac Rehab   Date  01/09/15   Educator  Dorisann Frames   Instruction Review Code  2- meets goals/outcomes  Flexibility, Balance, General Exercise Guidelines: - Provides group verbal and written instruction on the benefits of flexibility and balance training programs. Provides general exercise guidelines with specific guidelines to those with heart or lung disease. Demonstration and skill practice provided.      Cardiac Rehab from 02/15/2015 in [facility] Cardiac Rehab   Date  01/11/15   Educator  RM   Instruction Review Code  2- meets goals/outcomes      Stress Management: - Provides group verbal and written instruction about the health risks of elevated stress, cause of high stress, and healthy ways to reduce stress.      Cardiac Rehab from 12/28/2014 in [facility] Cardiac Rehab   Date  12/28/14   Educator  Lucianne Lei, LCSW   Instruction Review Code  2- meets goals/outcomes      Depression: - Provides group verbal and written instruction on the correlation between heart/lung disease and depressed mood, treatment options, and the stigmas associated with seeking treatment.      Cardiac  Rehab from 02/15/2015 in [facility] Cardiac Rehab   Date  02/15/15   Educator  [facility]   Instruction Review Code  2- meets goals/outcomes      Anatomy & Physiology of the Heart: - Group verbal and written instruction and models provide basic cardiac anatomy and physiology, with the coronary electrical and arterial systems. Review of: AMI, Angina, Valve disease, Heart Failure, Cardiac Arrhythmia, Pacemakers, and the ICD.      Cardiac Rehab from 02/15/2015 in [facility] Cardiac Rehab   Date  01/23/15   Educator  S. Jidenna Figgs   Instruction Review Code  2- meets goals/outcomes      Cardiac Procedures: - Group verbal and written instruction and models to describe the testing methods done to diagnose heart disease. Reviews the outcomes of the test results. Describes the treatment choices: Medical Management, Angioplasty, or Coronary Bypass Surgery.   Cardiac Medications: - Group verbal and written instruction to review commonly prescribed medications for heart disease. Reviews the medication, class of the drug, and side effects. Includes the steps to properly store meds and maintain the prescription regimen.      Cardiac Rehab from 01/09/2015 in [facility] Cardiac Rehab   Date  01/02/15   Educator  SB   Instruction Review Code  2- meets goals/outcomes      Go Sex-Intimacy & Heart Disease, Get SMART - Goal Setting: - Group verbal and written instruction through game format to discuss heart disease and the return to sexual intimacy. Provides group verbal and written material to discuss and apply goal setting through the application of the S.M.A.R.T. Method.   Other Matters of the Heart: - Provides group verbal, written materials and models to describe Heart Failure, Angina, Valve Disease, and Diabetes in the realm of heart disease. Includes description of the disease process and treatment options available to the cardiac patient.      Cardiac Rehab from 02/15/2015 in [facility] Cardiac Rehab   Date  02/01/15   Educator  SB    Instruction Review Code  2- meets goals/outcomes      Exercise & Equipment Safety: - Individual verbal instruction and demonstration of equipment use and safety with use of the equipment.      Cardiac Rehab from 12/22/2014 in [facility] Cardiac Rehab   Date  12/22/14   Educator  C. Enterkin   Instruction Review Code  1- partially meets, needs review/practice      Infection Prevention: - Provides verbal and written material to individual  with discussion of infection control including proper hand washing and proper equipment cleaning during exercise session.      Cardiac Rehab from 12/22/2014 in [facility] Cardiac Rehab   Date  12/22/14   Educator  C.Enterkin, RN   Instruction Review Code  2- meets goals/outcomes      Falls Prevention: - Provides verbal and written material to individual with discussion of falls prevention and safety.      Cardiac Rehab from 12/22/2014 in [facility] Cardiac Rehab   Date  12/22/14   Educator  C.Enterkin   Instruction Review Code  2- meets goals/outcomes      Diabetes: - Individual verbal and written instruction to review signs/symptoms of diabetes, desired ranges of glucose level fasting, after meals and with exercise. Advice that pre and post exercise glucose checks will be done for 3 sessions at entry of program.    Knowledge Questionnaire Score:   Personal Goals and Risk Factors at Admission:     Personal Goals and Risk Factors at Admission - 12/22/14 1732    Personal Goals and Risk Factors on Admission    Weight Management Yes   Intervention Learn and follow the exercise and diet guidelines while in the program. Utilize the nutrition and education classes to help gain knowledge of the diet and exercise expectations in the program   Increase Aerobic Exercise and Physical Activity Yes   Intervention While in program, learn and follow the exercise prescription taught. Start at a low level workload and increase workload after able to maintain previous level  for 30 minutes. Increase time before increasing intensity.   Take Less Medication Yes   Intervention Learn your risk factors and begin the lifestyle modifications for risk factor control during your time in the program.   Understand more about Heart/Pulmonary Disease. Yes   Intervention While in program utilize professionals for any questions, and attend the education sessions. Great websites to use are www.americanheart.org or www.lung.org for reliable information.   Diabetes Yes   Goal Blood glucose control identified by blood glucose values, HgbA1C. Participant verbalizes understanding of the signs/symptoms of hyper/hypo glycemia, proper foot care and importance of medication and nutrition plan for blood glucose control.   Intervention Provide nutrition & aerobic exercise along with prescribed medications to achieve blood glucose in normal ranges: Fasting 65-99 mg/dL      Personal Goals and Risk Factors Review:      Goals and Risk Factor Review      01/06/15 1012 02/01/15 1119 02/07/15 1048 03/07/15 1310     Weight Management   Goals Progress/Improvement seen  Yes Yes Yes    Comments  Weight is down since admission to program Weight is down since admission to program weight maintianing    Increase Aerobic Exercise and Physical Activity   Goals Progress/Improvement seen  Yes Yes Yes Yes    Comments Doing well on Elliptical and Treadmill Doing well with progression and interval training Doing well with progression and interval training Continues to do well with exercise progression. Has been out a couple weeks    Take Less Medication   Goals Progress/Improvement seen  No No No    Comments  Understands this is a long term goal,to continue to pursue goal by maintianing the guidelines for nutrition and exerxise learned in program. Understands this is a long term goal,to continue to pursue goal by maintianing the guidelines for nutrition and exerxise learned in program. Understands this is a  long term goal,to continue to pursue goal  by maintianing the guidelines for nutrition and exerxise learned in program.    Understand more about Heart/Pulmonary Disease   Goals Progress/Improvement seen   Yes Yes Yes    Comments  Attending classes and asking questions as needed. Attending classes and asking questions as needed. Attending classes and asking questions as needed.    Diabetes   Goal   Blood glucose control identified by blood glucose values, HgbA1C. Participant verbalizes understanding of the signs/symptoms of hyper/hypo glycemia, proper foot care and importance of medication and nutrition plan for blood glucose control. Blood glucose control identified by blood glucose values, HgbA1C. Participant verbalizes understanding of the signs/symptoms of hyper/hypo glycemia, proper foot care and importance of medication and nutrition plan for blood glucose control.    Progress seen towards goals  Yes Yes Yes    Comments  Blood sugar control is improved since nutrition changes and exercise regimen. Blood sugar control is improved since nutrition changes and exercise regimen. Blood sugar control is improved since nutrition changes and exercise regimen.       Personal Goals Discharge:     Comments: 30 day review. Continue with ITP.

## 2015-03-08 ENCOUNTER — Other Ambulatory Visit: Payer: Self-pay | Admitting: *Deleted

## 2015-03-08 DIAGNOSIS — Z951 Presence of aortocoronary bypass graft: Secondary | ICD-10-CM

## 2015-03-20 ENCOUNTER — Encounter: Payer: BC Managed Care – PPO | Attending: Cardiovascular Disease

## 2015-03-20 DIAGNOSIS — Z951 Presence of aortocoronary bypass graft: Secondary | ICD-10-CM | POA: Insufficient documentation

## 2015-03-20 DIAGNOSIS — I213 ST elevation (STEMI) myocardial infarction of unspecified site: Secondary | ICD-10-CM | POA: Insufficient documentation

## 2015-03-27 ENCOUNTER — Encounter: Payer: Self-pay | Admitting: *Deleted

## 2015-03-29 ENCOUNTER — Telehealth: Payer: Self-pay | Admitting: *Deleted

## 2015-03-29 NOTE — Telephone Encounter (Signed)
Spoke with [place] today.  He has hurt his foot and is wearing a boot. Hopes to return soon.

## 2015-03-30 ENCOUNTER — Encounter: Payer: Self-pay | Admitting: *Deleted

## 2015-03-30 DIAGNOSIS — Z951 Presence of aortocoronary bypass graft: Secondary | ICD-10-CM

## 2015-03-30 NOTE — Progress Notes (Signed)
Cardiac Individual Treatment Plan  Patient Details  Name: Seth Riddle MRN: [id number] Date of Birth: 1961-02-24 Referring Provider:  Thayer Headings, MD  Initial Encounter Date:    Visit Diagnosis: S/P CABG (coronary artery bypass graft)  Patient's Home Medications on Admission:  Current outpatient prescriptions:  .  acetaminophen (TYLENOL) 500 MG tablet, Take 500 mg by mouth every 6 (six) hours as needed., Disp: , Rfl:  .  aspirin EC 81 MG tablet, Take 1 tablet (81 mg total) by mouth daily., Disp: 90 tablet, Rfl: 3 .  atorvastatin (LIPITOR) 80 MG tablet, Take 1 tablet (80 mg total) by mouth daily at 6 PM., Disp: 30 tablet, Rfl: 1 .  blood glucose meter kit and supplies KIT, Dispense based on patient and insurance preference. Use up to four times daily as directed. (FOR ICD-9 250.00, 250.01)., Disp: 1 each, Rfl: 0 .  gabapentin (NEURONTIN) 300 MG capsule, Take 1 capsule (300 mg total) by mouth 3 (three) times daily., Disp: 90 capsule, Rfl: 1 .  lisinopril (PRINIVIL,ZESTRIL) 10 MG tablet, Take 1 tablet (10 mg total) by mouth daily., Disp: 30 tablet, Rfl: 1 .  MELATONIN PO, Take 1 tablet by mouth daily as needed (sleep)., Disp: , Rfl:  .  metFORMIN (GLUCOPHAGE) 500 MG tablet, Take 500 mg by mouth 2 (two) times daily with a meal., Disp: , Rfl:  .  metoprolol tartrate (LOPRESSOR) 25 MG tablet, Take 1 tablet (25 mg total) by mouth 2 (two) times daily., Disp: 60 tablet, Rfl: 1 .  oxyCODONE (OXY IR/ROXICODONE) 5 MG immediate release tablet, Take 1 tablet (5 mg total) by mouth 4 (four) times daily as needed for severe pain., Disp: 30 tablet, Rfl: 0  Past Medical History: Past Medical History  Diagnosis Date  . Dyslipidemia (high LDL; low HDL)   . Family history of premature CAD   . Coronary artery disease   . Low HDL (under 40)   . Diabetes mellitus without complication   . MI (myocardial infarction)     Tobacco Use: History  Smoking status  . Never Smoker   Smokeless tobacco   . Never Used    Labs: Recent Review Flowsheet Data    Labs for ITP Cardiac and Pulmonary Rehab Latest Ref Rng 11/16/2014 11/16/2014 11/16/2014 11/16/2014 11/16/2014   PHART 7.350 - 7.450 7.361 7.375 7.312(L) 7.379 -   PCO2ART 35.0 - 45.0 mmHg 40.0 38.3 41.4 38.7 -   HCO3 20.0 - 24.0 mEq/L 22.3 22.1 20.8 22.8 -   TCO2 0 - 100 mmol/L $RemoveBe'23 23 22 24 22   'mjsEvNiWZ$ ACIDBASEDEF 0.0 - 2.0 mmol/L 2.0 2.0 5.0(H) 2.0 -   O2SAT - 97.0 93.0 91.0 90.0 -       Exercise Target Goals:    Exercise Program Goal: Individual exercise prescription set with THRR, safety & activity barriers. Participant demonstrates ability to understand and report RPE using BORG scale, to self-measure pulse accurately, and to acknowledge the importance of the exercise prescription.  Exercise Prescription Goal: Starting with aerobic activity 30 plus minutes a day, 3 days per week for initial exercise prescription. Provide home exercise prescription and guidelines that participant acknowledges understanding prior to discharge.  Activity Barriers & Risk Stratification:     Activity Barriers & Risk Stratification - 12/22/14 1728    Activity Barriers & Risk Stratification   Activity Barriers None   Risk Stratification High      6 Minute Walk:     6 Minute Walk  12/22/14 1535       6 Minute Walk   Distance 1680 feet     Walk Time 6 minutes     Resting HR 66 bpm     Resting BP 124/64 mmHg     Max Ex. HR 87 bpm     Max Ex. BP 132/74 mmHg     RPE 11     Perceived Dyspnea  1        Initial Exercise Prescription:     Initial Exercise Prescription - 12/22/14 1500    Treadmill   MPH 2.5   Grade 0   Minutes 15   Bike   Level 1   Minutes 10   Recumbant Bike   Level 2   Watts 50   Minutes 10   NuStep   Level 3   Watts 50   Minutes 10   Arm Ergometer   Level 1   Watts 10   Minutes 10   Arm/Foot Ergometer   Level 1   Watts 12   Minutes 10   Cybex   Level 1   RPM 40   Minutes 10   Recumbant  Elliptical   Level 1   RPM 50   Watts 30   Minutes 10   Elliptical   Level 1   Speed 3   Minutes 2   REL-XR   Level 3   Watts 50   Minutes 10   Prescription Details   Frequency (times per week) 3   Duration Progress to 30 minutes of continuous aerobic without signs/symptoms of physical distress   Intensity   THRR REST +  30   Ratings of Perceived Exertion 11-15   Perceived Dyspnea 2-4   Progression Continue progressive overload as per policy without signs/symptoms or physical distress.   Resistance Training   Training Prescription Yes   Weight 2   Reps 10-12      Exercise Prescription Changes:     Exercise Prescription Changes      12/30/14 0900 01/09/15 1300 01/10/15 0600 01/20/15 0900 01/25/15 0800   Exercise Review   Progression  Yes Yes Yes Yes   Response to Exercise   Blood Pressure (Admit)  102/72 mmHg 102/72 mmHg 102/72 mmHg    Blood Pressure (Exercise)  140/82 mmHg 140/82 mmHg 140/82 mmHg    Blood Pressure (Exit)  118/60 mmHg 118/60 mmHg 118/60 mmHg    Heart Rate (Admit)  57 bpm 57 bpm 57 bpm    Heart Rate (Exercise)  116 bpm 116 bpm 116 bpm    Heart Rate (Exit)  78 bpm 78 bpm 78 bpm    Rating of Perceived Exertion (Exercise)  _0 Resistance Training   Training Prescription   Yes Yes Yes   Weight   _1 Reps   10-12 10-12 10-12   Interval Training   Interval Training   Yes Yes Yes   Equipment   Treadmill Treadmill Treadmill   Comments   4.2/0 3 min, 4.5/0 30 sec - 1 min and then drop back to 4.2/0 and repeat 4.2/0 3 min, 4.5/0 all at 1% incline 30 sec - 1 min and then drop back to 4.2/0 and repeat  Add 1 day of exercise at home up to 5 mph @ 2%   Treadmill   MPH 4 4.2 4.2 4.2    Grade 0 0 0 0    Minutes  20 20 20  Bike   Level  1.1 1.1 1.1    Minutes  $Remove'10 10 10    'CsDielg$ Elliptical   Level  $Remo'3 3 3 4   'foHGl$ Speed  $Remo'4 4 4 4   'CHwaV$ Minutes  $Remove'10 10 10 10     'StCQTTE$ 01/27/15 0900 02/06/15 1700 02/21/15 0700 03/27/15 0800     Exercise Review   Progression  Yes Yes  No  Absent since last review    Response to Exercise   Blood Pressure (Admit)  130/70 mmHg 102/70 mmHg 102/70 mmHg    Blood Pressure (Exercise)  144/80 mmHg 140/74 mmHg 140/74 mmHg    Blood Pressure (Exit)  106/62 mmHg 118/72 mmHg 118/72 mmHg    Heart Rate (Admit)  71 bpm 78 bpm 78 bpm    Heart Rate (Exercise)  101 bpm 116 bpm 116 bpm    Heart Rate (Exit)  89 bpm 73 bpm 73 bpm    Rating of Perceived Exertion (Exercise)  $RemoveBef'15 15 15    'JHvccsPvhX$ Symptoms  No No No    Frequency  Add 1 additional day to program exercise sessions. Add 2 additional days to program exercise sessions. Add 2 additional days to program exercise sessions.    Duration  Progress to 50 minutes of aerobic without signs/symptoms of physical distress Progress to 50 minutes of aerobic without signs/symptoms of physical distress Progress to 50 minutes of aerobic without signs/symptoms of physical distress    Intensity  Rest + 30 THRR New  115-157 (40-85% HRR) THRR New  115-157 (40-85% HRR)    Progression  Continue progressive overload as per policy without signs/symptoms or physical distress. Continue progressive overload as per policy without signs/symptoms or physical distress. Continue progressive overload as per policy without signs/symptoms or physical distress.    Resistance Training   Training Prescription Yes Yes Yes Yes    Weight $Remov'8 8 10 10    'HpFwBo$ Reps 10-12 10-15 10-15 10-15    Interval Training   Interval Training  Yes Yes Yes    Equipment  Treadmill Treadmill Treadmill    Comments  4.2/2 3 min, 5.2/2 all at 1% incline 30 sec - 1 min and then drop back to 4.2/0 and repeat  Add 1 day of exercise at home 4.2/2 3 min, 5.2/2 all at 1% incline 30 sec - 1 min and then drop back to 4.2/0 and repeat  Add 1 day of exercise at home 4.2/2 3 min, 5.2/2 all at 1% incline 30 sec - 1 min and then drop back to 4.2/0 and repeat  Add 1 day of exercise at home    Treadmill   MPH  4.2 5.2 5.2    Grade  $Remo'2 2 2    'JprQR$ Minutes  $Remove'20 20 20    'usUYNuU$ Bike   Level   1.1 1.1 1.1    Minutes  $Remove'15 15 15    'unjSroU$ Elliptical   Level  $Remo'5 6 6    'YhPMR$ Speed  $Remo'4 4 4    'WOqid$ Minutes  $Remove'15 15 15       'ybpWica$ Discharge Exercise Prescription (Final Exercise Prescription Changes):     Exercise Prescription Changes - 03/27/15 0800    Exercise Review   Progression No  Absent since last review   Response to Exercise   Blood Pressure (Admit) 102/70 mmHg   Blood Pressure (Exercise) 140/74 mmHg   Blood Pressure (Exit) 118/72 mmHg   Heart Rate (Admit) 78 bpm   Heart Rate (Exercise) 116 bpm   Heart Rate (  Exit) 73 bpm   Rating of Perceived Exertion (Exercise) 15   Symptoms No   Frequency Add 2 additional days to program exercise sessions.   Duration Progress to 50 minutes of aerobic without signs/symptoms of physical distress   Intensity THRR New  115-157 (40-85% HRR)   Progression Continue progressive overload as per policy without signs/symptoms or physical distress.   Resistance Training   Training Prescription Yes   Weight 10   Reps 10-15   Interval Training   Interval Training Yes   Equipment Treadmill   Comments 4.2/2 3 min, 5.2/2 all at 1% incline 30 sec - 1 min and then drop back to 4.2/0 and repeat  Add 1 day of exercise at home   Treadmill   MPH 5.2   Grade 2   Minutes 20   Bike   Level 1.1   Minutes 15   Elliptical   Level 6   Speed 4   Minutes 15      Nutrition:  Target Goals: Understanding of nutrition guidelines, daily intake of sodium '1500mg'$ , cholesterol '200mg'$ , calories 30% from fat and 7% or less from saturated fats, daily to have 5 or more servings of fruits and vegetables.  Biometrics:    Nutrition Therapy Plan and Nutrition Goals:     Nutrition Therapy & Goals - 02/03/15 1629    Nutrition Therapy   Diet 1800 calorie meal plan for diabetes; DASH diet principles   Drug/Food Interactions Statins/Certain Fruits   Fiber 20 grams   Whole Grain Foods 3 servings   Protein 9 ounces/day   Saturated Fats 12 max. grams   Fruits and Vegetables 5  servings/day   Personal Nutrition Goals   Personal Goal #1 Include breakfast every morning that includes a protein food and at least 2 carbohydrate servings.   Personal Goal #2 Read labels for saturated fat, trans fat and sodium   Personal Goal #3 Read labels for carbohydrate grams. Keep meals in range of 45-60 gms and snacks in range of 15-30 gms.      Nutrition Discharge: Rate Your Plate Scores:     Rate Your Plate - 79/39/03 0092    Rate Your Plate Scores   Pre Score 77   Pre Score % 88 %      Nutrition Goals Re-Evaluation:     Nutrition Goals Re-Evaluation      01/06/15 1011 02/01/15 1117 02/07/15 1048 03/07/15 1309     Personal Goal #1 Re-Evaluation   Personal Goal #1 Is eating healtier Working on healthy eating habits Include breakfast every morning that includes a protein food and at least 2 carbohydrate servings. Include breakfast every morning that includes a protein food and at least 2 carbohydrate servings.    Goal Progress Seen Yes Yes Yes     Comments  Continues to work on healthy eating.  No sugar being used since left hospital Continues to work on healthy eating.  No sugar being used since left hospital Continues to work on healthy eating.  No sugar being used since left hospital    Personal Goal #2 Re-Evaluation   Personal Goal #2   Read labels for saturated fat, trans fat and sodium Read labels for saturated fat, trans fat and sodium    Goal Progress Seen    Yes    Comments    Doing best to read labels    Personal Goal #3 Re-Evaluation   Personal Goal #3   Read labels for carbohydrate grams. Keep meals in  range of 45-60 gms and snacks in range of 15-30 gms. Read labels for carbohydrate grams. Keep meals in range of 45-60 gms and snacks in range of 15-30 gms.    Goal Progress Seen    Yes    Comments    Doing best to read labels       Psychosocial: Target Goals: Acknowledge presence or absence of depression, maximize coping skills, provide positive support  system. Participant is able to verbalize types and ability to use techniques and skills needed for reducing stress and depression.  Initial Review & Psychosocial Screening:     Initial Psych Review & Screening - 12/22/14 [place]? Yes   Barriers   Psychosocial barriers to participate in program There are no identifiable barriers or psychosocial needs.   Screening Interventions   Interventions Encouraged to exercise      Quality of Life Scores:     Quality of Life - 02/07/15 1049    Quality of Life Scores   Health/Function Pre 22.25 %   Socioeconomic Pre 23.86 %   Psych/Spiritual Pre 23.5 %   Family Pre 27 %   GLOBAL Pre 23.58 %      PHQ-9:     Recent Review Flowsheet Data    Depression screen [facility] 2/9 12/22/2014   Decreased Interest 0   Down, Depressed, Hopeless 0   PHQ - 2 Score 0   Altered sleeping 2   Tired, decreased energy 0   Change in appetite 0   Feeling bad or failure about yourself  0   Trouble concentrating 0   Moving slowly or fidgety/restless 0   Suicidal thoughts 0   PHQ-9 Score 2      Psychosocial Evaluation and Intervention:     Psychosocial Evaluation - 12/22/14 1734    Psychosocial Evaluation & Interventions   Interventions Encouraged to exercise with the program and follow exercise prescription      Psychosocial Re-Evaluation:   Vocational Rehabilitation: Provide vocational rehab assistance to qualifying candidates.   Vocational Rehab Evaluation & Intervention:     Vocational Rehab - 12/22/14 1728    Initial Vocational Rehab Evaluation & Intervention   Assessment shows need for Vocational Rehabilitation No      Education: Education Goals: Education classes will be provided on a weekly basis, covering required topics. Participant will state understanding/return demonstration of topics presented.  Learning Barriers/Preferences:     Learning Barriers/Preferences - 12/22/14 1728    Learning  Barriers/Preferences   Learning Barriers None   Learning Preferences None      Education Topics: General Nutrition Guidelines/Fats and Fiber: -Group instruction provided by verbal, written material, models and posters to present the general guidelines for heart healthy nutrition. Gives an explanation and review of dietary fats and fiber.          Cardiac Rehab from 02/15/2015 in [facility] Cardiac Rehab   Date  02/06/15   Educator  CR   Instruction Review Code  2- meets goals/outcomes      Controlling Sodium/Reading Food Labels: -Group verbal and written material supporting the discussion of sodium use in heart healthy nutrition. Review and explanation with models, verbal and written materials for utilization of the food label.   Exercise Physiology & Risk Factors: - Group verbal and written instruction with models to review the exercise physiology of the cardiovascular system and associated critical values. Details cardiovascular disease risk factors and the goals associated with each risk factor.  Aerobic Exercise & Resistance Training: - Gives group verbal and written discussion on the health impact of inactivity. On the components of aerobic and resistive training programs and the benefits of this training and how to safely progress through these programs.      Cardiac Rehab from 01/09/2015 in [facility] Cardiac Rehab   Date  01/09/15   Educator  Dorisann Frames   Instruction Review Code  2- meets goals/outcomes      Flexibility, Balance, General Exercise Guidelines: - Provides group verbal and written instruction on the benefits of flexibility and balance training programs. Provides general exercise guidelines with specific guidelines to those with heart or lung disease. Demonstration and skill practice provided.      Cardiac Rehab from 02/15/2015 in [facility] Cardiac Rehab   Date  01/11/15   Educator  RM   Instruction Review Code  2- meets goals/outcomes      Stress Management: -  Provides group verbal and written instruction about the health risks of elevated stress, cause of high stress, and healthy ways to reduce stress.      Cardiac Rehab from 12/28/2014 in [facility] Cardiac Rehab   Date  12/28/14   Educator  Lucianne Lei, LCSW   Instruction Review Code  2- meets goals/outcomes      Depression: - Provides group verbal and written instruction on the correlation between heart/lung disease and depressed mood, treatment options, and the stigmas associated with seeking treatment.      Cardiac Rehab from 02/15/2015 in [facility] Cardiac Rehab   Date  02/15/15   Educator  [facility]   Instruction Review Code  2- meets goals/outcomes      Anatomy & Physiology of the Heart: - Group verbal and written instruction and models provide basic cardiac anatomy and physiology, with the coronary electrical and arterial systems. Review of: AMI, Angina, Valve disease, Heart Failure, Cardiac Arrhythmia, Pacemakers, and the ICD.      Cardiac Rehab from 02/15/2015 in [facility] Cardiac Rehab   Date  01/23/15   Educator  S. Geraldina Parrott   Instruction Review Code  2- meets goals/outcomes      Cardiac Procedures: - Group verbal and written instruction and models to describe the testing methods done to diagnose heart disease. Reviews the outcomes of the test results. Describes the treatment choices: Medical Management, Angioplasty, or Coronary Bypass Surgery.   Cardiac Medications: - Group verbal and written instruction to review commonly prescribed medications for heart disease. Reviews the medication, class of the drug, and side effects. Includes the steps to properly store meds and maintain the prescription regimen.      Cardiac Rehab from 01/09/2015 in [facility] Cardiac Rehab   Date  01/02/15   Educator  SB   Instruction Review Code  2- meets goals/outcomes      Go Sex-Intimacy & Heart Disease, Get SMART - Goal Setting: - Group verbal and written instruction through game format to discuss heart disease and the  return to sexual intimacy. Provides group verbal and written material to discuss and apply goal setting through the application of the S.M.A.R.T. Method.   Other Matters of the Heart: - Provides group verbal, written materials and models to describe Heart Failure, Angina, Valve Disease, and Diabetes in the realm of heart disease. Includes description of the disease process and treatment options available to the cardiac patient.      Cardiac Rehab from 02/15/2015 in [facility] Cardiac Rehab   Date  02/01/15   Educator  SB   Instruction Review Code  2- meets goals/outcomes      Exercise & Equipment Safety: - Individual verbal instruction and demonstration of equipment use and safety with use of the equipment.      Cardiac Rehab from 12/22/2014 in [facility] Cardiac Rehab   Date  12/22/14   Educator  C. Enterkin   Instruction Review Code  1- partially meets, needs review/practice      Infection Prevention: - Provides verbal and written material to individual with discussion of infection control including proper hand washing and proper equipment cleaning during exercise session.      Cardiac Rehab from 12/22/2014 in [facility] Cardiac Rehab   Date  12/22/14   Educator  C.Enterkin, RN   Instruction Review Code  2- meets goals/outcomes      Falls Prevention: - Provides verbal and written material to individual with discussion of falls prevention and safety.      Cardiac Rehab from 12/22/2014 in [facility] Cardiac Rehab   Date  12/22/14   Educator  C.Enterkin   Instruction Review Code  2- meets goals/outcomes      Diabetes: - Individual verbal and written instruction to review signs/symptoms of diabetes, desired ranges of glucose level fasting, after meals and with exercise. Advice that pre and post exercise glucose checks will be done for 3 sessions at entry of program.    Knowledge Questionnaire Score:   Personal Goals and Risk Factors at Admission:     Personal Goals and Risk Factors at Admission -  12/22/14 1732    Personal Goals and Risk Factors on Admission    Weight Management Yes   Intervention Learn and follow the exercise and diet guidelines while in the program. Utilize the nutrition and education classes to help gain knowledge of the diet and exercise expectations in the program   Increase Aerobic Exercise and Physical Activity Yes   Intervention While in program, learn and follow the exercise prescription taught. Start at a low level workload and increase workload after able to maintain previous level for 30 minutes. Increase time before increasing intensity.   Take Less Medication Yes   Intervention Learn your risk factors and begin the lifestyle modifications for risk factor control during your time in the program.   Understand more about Heart/Pulmonary Disease. Yes   Intervention While in program utilize professionals for any questions, and attend the education sessions. Great websites to use are www.americanheart.org or www.lung.org for reliable information.   Diabetes Yes   Goal Blood glucose control identified by blood glucose values, HgbA1C. Participant verbalizes understanding of the signs/symptoms of hyper/hypo glycemia, proper foot care and importance of medication and nutrition plan for blood glucose control.   Intervention Provide nutrition & aerobic exercise along with prescribed medications to achieve blood glucose in normal ranges: Fasting 65-99 mg/dL      Personal Goals and Risk Factors Review:      Goals and Risk Factor Review      01/06/15 1012 02/01/15 1119 02/07/15 1048 03/07/15 1310     Weight Management   Goals Progress/Improvement seen  Yes Yes Yes    Comments  Weight is down since admission to program Weight is down since admission to program weight maintianing    Increase Aerobic Exercise and Physical Activity   Goals Progress/Improvement seen  Yes Yes Yes Yes    Comments Doing well on Elliptical and Treadmill Doing well with progression and  interval training Doing well with progression and interval training Continues to do well with exercise progression. Has been out  a couple weeks    Take Less Medication   Goals Progress/Improvement seen  No No No    Comments  Understands this is a long term goal,to continue to pursue goal by maintianing the guidelines for nutrition and exerxise learned in program. Understands this is a long term goal,to continue to pursue goal by maintianing the guidelines for nutrition and exerxise learned in program. Understands this is a long term goal,to continue to pursue goal by maintianing the guidelines for nutrition and exerxise learned in program.    Understand more about Heart/Pulmonary Disease   Goals Progress/Improvement seen   Yes Yes Yes    Comments  Attending classes and asking questions as needed. Attending classes and asking questions as needed. Attending classes and asking questions as needed.    Diabetes   Goal   Blood glucose control identified by blood glucose values, HgbA1C. Participant verbalizes understanding of the signs/symptoms of hyper/hypo glycemia, proper foot care and importance of medication and nutrition plan for blood glucose control. Blood glucose control identified by blood glucose values, HgbA1C. Participant verbalizes understanding of the signs/symptoms of hyper/hypo glycemia, proper foot care and importance of medication and nutrition plan for blood glucose control.    Progress seen towards goals  Yes Yes Yes    Comments  Blood sugar control is improved since nutrition changes and exercise regimen. Blood sugar control is improved since nutrition changes and exercise regimen. Blood sugar control is improved since nutrition changes and exercise regimen.       Personal Goals Discharge:     Comments: 30 day review [name] has been out since 7/16. He has injured his foot and is currently wearing a boot. Plans to return as soon as his foot heals.

## 2015-04-05 ENCOUNTER — Other Ambulatory Visit: Payer: Self-pay | Admitting: *Deleted

## 2015-04-05 DIAGNOSIS — Z951 Presence of aortocoronary bypass graft: Secondary | ICD-10-CM

## 2015-04-05 NOTE — Progress Notes (Signed)
Cardiac Individual Treatment Plan  Patient Details  Name: Seth Riddle MRN: [id number] Date of Birth: 01/13/1961 Referring Provider:  No ref. provider found  Initial Encounter Date:    Visit Diagnosis: No diagnosis found.  Patient's Home Medications on Admission:  Current outpatient prescriptions:  .  acetaminophen (TYLENOL) 500 MG tablet, Take 500 mg by mouth every 6 (six) hours as needed., Disp: , Rfl:  .  aspirin EC 81 MG tablet, Take 1 tablet (81 mg total) by mouth daily., Disp: 90 tablet, Rfl: 3 .  atorvastatin (LIPITOR) 80 MG tablet, Take 1 tablet (80 mg total) by mouth daily at 6 PM., Disp: 30 tablet, Rfl: 1 .  blood glucose meter kit and supplies KIT, Dispense based on patient and insurance preference. Use up to four times daily as directed. (FOR ICD-9 250.00, 250.01)., Disp: 1 each, Rfl: 0 .  gabapentin (NEURONTIN) 300 MG capsule, Take 1 capsule (300 mg total) by mouth 3 (three) times daily., Disp: 90 capsule, Rfl: 1 .  lisinopril (PRINIVIL,ZESTRIL) 10 MG tablet, Take 1 tablet (10 mg total) by mouth daily., Disp: 30 tablet, Rfl: 1 .  MELATONIN PO, Take 1 tablet by mouth daily as needed (sleep)., Disp: , Rfl:  .  metFORMIN (GLUCOPHAGE) 500 MG tablet, Take 500 mg by mouth 2 (two) times daily with a meal., Disp: , Rfl:  .  metoprolol tartrate (LOPRESSOR) 25 MG tablet, Take 1 tablet (25 mg total) by mouth 2 (two) times daily., Disp: 60 tablet, Rfl: 1 .  oxyCODONE (OXY IR/ROXICODONE) 5 MG immediate release tablet, Take 1 tablet (5 mg total) by mouth 4 (four) times daily as needed for severe pain., Disp: 30 tablet, Rfl: 0  Past Medical History: Past Medical History  Diagnosis Date  . Dyslipidemia (high LDL; low HDL)   . Family history of premature CAD   . Coronary artery disease   . Low HDL (under 40)   . Diabetes mellitus without complication   . MI (myocardial infarction)     Tobacco Use: History  Smoking status  . Never Smoker   Smokeless tobacco  . Never Used     Labs: Recent Review Flowsheet Data    Labs for ITP Cardiac and Pulmonary Rehab Latest Ref Rng 11/16/2014 11/16/2014 11/16/2014 11/16/2014 11/16/2014   PHART 7.350 - 7.450 7.361 7.375 7.312(L) 7.379 -   PCO2ART 35.0 - 45.0 mmHg 40.0 38.3 41.4 38.7 -   HCO3 20.0 - 24.0 mEq/L 22.3 22.1 20.8 22.8 -   TCO2 0 - 100 mmol/L $RemoveBe'23 23 22 24 22   'IJGacsIoq$ ACIDBASEDEF 0.0 - 2.0 mmol/L 2.0 2.0 5.0(H) 2.0 -   O2SAT - 97.0 93.0 91.0 90.0 -       Exercise Target Goals:    Exercise Program Goal: Individual exercise prescription set with THRR, safety & activity barriers. Participant demonstrates ability to understand and report RPE using BORG scale, to self-measure pulse accurately, and to acknowledge the importance of the exercise prescription.  Exercise Prescription Goal: Starting with aerobic activity 30 plus minutes a day, 3 days per week for initial exercise prescription. Provide home exercise prescription and guidelines that participant acknowledges understanding prior to discharge.  Activity Barriers & Risk Stratification:     Activity Barriers & Risk Stratification - 12/22/14 1728    Activity Barriers & Risk Stratification   Activity Barriers None   Risk Stratification High      6 Minute Walk:     6 Minute Walk      12/22/14 1535  6 Minute Walk   Distance 1680 feet     Walk Time 6 minutes     Resting HR 66 bpm     Resting BP 124/64 mmHg     Max Ex. HR 87 bpm     Max Ex. BP 132/74 mmHg     RPE 11     Perceived Dyspnea  1        Initial Exercise Prescription:     Initial Exercise Prescription - 12/22/14 1500    Treadmill   MPH 2.5   Grade 0   Minutes 15   Bike   Level 1   Minutes 10   Recumbant Bike   Level 2   Watts 50   Minutes 10   NuStep   Level 3   Watts 50   Minutes 10   Arm Ergometer   Level 1   Watts 10   Minutes 10   Arm/Foot Ergometer   Level 1   Watts 12   Minutes 10   Cybex   Level 1   RPM 40   Minutes 10   Recumbant Elliptical   Level 1    RPM 50   Watts 30   Minutes 10   Elliptical   Level 1   Speed 3   Minutes 2   REL-XR   Level 3   Watts 50   Minutes 10   Prescription Details   Frequency (times per week) 3   Duration Progress to 30 minutes of continuous aerobic without signs/symptoms of physical distress   Intensity   THRR REST +  30   Ratings of Perceived Exertion 11-15   Perceived Dyspnea 2-4   Progression Continue progressive overload as per policy without signs/symptoms or physical distress.   Resistance Training   Training Prescription Yes   Weight 2   Reps 10-12      Exercise Prescription Changes:     Exercise Prescription Changes      12/30/14 0900 01/09/15 1300 01/10/15 0600 01/20/15 0900 01/25/15 0800   Exercise Review   Progression  Yes Yes Yes Yes   Response to Exercise   Blood Pressure (Admit)  102/72 mmHg 102/72 mmHg 102/72 mmHg    Blood Pressure (Exercise)  140/82 mmHg 140/82 mmHg 140/82 mmHg    Blood Pressure (Exit)  118/60 mmHg 118/60 mmHg 118/60 mmHg    Heart Rate (Admit)  57 bpm 57 bpm 57 bpm    Heart Rate (Exercise)  116 bpm 116 bpm 116 bpm    Heart Rate (Exit)  78 bpm 78 bpm 78 bpm    Rating of Perceived Exertion (Exercise)  $RemoveBef'13 13 13    'feGnRwQUJs$ Resistance Training   Training Prescription   Yes Yes Yes   Weight   '3 3 5   '$ Reps   10-12 10-12 10-12   Interval Training   Interval Training   Yes Yes Yes   Equipment   Treadmill Treadmill Treadmill   Comments   4.2/0 3 min, 4.5/0 30 sec - 1 min and then drop back to 4.2/0 and repeat 4.2/0 3 min, 4.5/0 all at 1% incline 30 sec - 1 min and then drop back to 4.2/0 and repeat  Add 1 day of exercise at home up to 5 mph @ 2%   Treadmill   MPH 4 4.2 4.2 4.2    Grade 0 0 0 0    Minutes  $Remove'20 20 20    'KCgTtcd$ Bike   Level  1.1 1.1 1.1  Minutes  $Remove'10 10 10    'jpGxSdG$ Elliptical   Level  $Remo'3 3 3 4   'LkpYz$ Speed  $Remo'4 4 4 4   'AoPPy$ Minutes  $Remove'10 10 10 10     'SNqxUTT$ 01/27/15 0900 02/06/15 1700 02/21/15 0700 03/27/15 0800     Exercise Review   Progression  Yes Yes No  Absent since last  review    Response to Exercise   Blood Pressure (Admit)  130/70 mmHg 102/70 mmHg 102/70 mmHg    Blood Pressure (Exercise)  144/80 mmHg 140/74 mmHg 140/74 mmHg    Blood Pressure (Exit)  106/62 mmHg 118/72 mmHg 118/72 mmHg    Heart Rate (Admit)  71 bpm 78 bpm 78 bpm    Heart Rate (Exercise)  101 bpm 116 bpm 116 bpm    Heart Rate (Exit)  89 bpm 73 bpm 73 bpm    Rating of Perceived Exertion (Exercise)  $RemoveBef'15 15 15    'LQRGYDnlEi$ Symptoms  No No No    Frequency  Add 1 additional day to program exercise sessions. Add 2 additional days to program exercise sessions. Add 2 additional days to program exercise sessions.    Duration  Progress to 50 minutes of aerobic without signs/symptoms of physical distress Progress to 50 minutes of aerobic without signs/symptoms of physical distress Progress to 50 minutes of aerobic without signs/symptoms of physical distress    Intensity  Rest + 30 THRR New  115-157 (40-85% HRR) THRR New  115-157 (40-85% HRR)    Progression  Continue progressive overload as per policy without signs/symptoms or physical distress. Continue progressive overload as per policy without signs/symptoms or physical distress. Continue progressive overload as per policy without signs/symptoms or physical distress.    Resistance Training   Training Prescription Yes Yes Yes Yes    Weight $Remov'8 8 10 10    'fYWRlR$ Reps 10-12 10-15 10-15 10-15    Interval Training   Interval Training  Yes Yes Yes    Equipment  Treadmill Treadmill Treadmill    Comments  4.2/2 3 min, 5.2/2 all at 1% incline 30 sec - 1 min and then drop back to 4.2/0 and repeat  Add 1 day of exercise at home 4.2/2 3 min, 5.2/2 all at 1% incline 30 sec - 1 min and then drop back to 4.2/0 and repeat  Add 1 day of exercise at home 4.2/2 3 min, 5.2/2 all at 1% incline 30 sec - 1 min and then drop back to 4.2/0 and repeat  Add 1 day of exercise at home    Treadmill   MPH  4.2 5.2 5.2    Grade  $Remo'2 2 2    'gTcEo$ Minutes  $Remove'20 20 20    'VDuOLSh$ Bike   Level  1.1 1.1 1.1     Minutes  $Remove'15 15 15    'EyJKFKV$ Elliptical   Level  $Remo'5 6 6    'yKiYh$ Speed  $Remo'4 4 4    'SBrvs$ Minutes  $Remove'15 15 15       'TaBTJUF$ Discharge Exercise Prescription (Final Exercise Prescription Changes):     Exercise Prescription Changes - 03/27/15 0800    Exercise Review   Progression No  Absent since last review   Response to Exercise   Blood Pressure (Admit) 102/70 mmHg   Blood Pressure (Exercise) 140/74 mmHg   Blood Pressure (Exit) 118/72 mmHg   Heart Rate (Admit) 78 bpm   Heart Rate (Exercise) 116 bpm   Heart Rate (Exit) 73 bpm   Rating of Perceived Exertion (Exercise) 15  Symptoms No   Frequency Add 2 additional days to program exercise sessions.   Duration Progress to 50 minutes of aerobic without signs/symptoms of physical distress   Intensity THRR New  115-157 (40-85% HRR)   Progression Continue progressive overload as per policy without signs/symptoms or physical distress.   Resistance Training   Training Prescription Yes   Weight 10   Reps 10-15   Interval Training   Interval Training Yes   Equipment Treadmill   Comments 4.2/2 3 min, 5.2/2 all at 1% incline 30 sec - 1 min and then drop back to 4.2/0 and repeat  Add 1 day of exercise at home   Treadmill   MPH 5.2   Grade 2   Minutes 20   Bike   Level 1.1   Minutes 15   Elliptical   Level 6   Speed 4   Minutes 15      Nutrition:  Target Goals: Understanding of nutrition guidelines, daily intake of sodium '1500mg'$ , cholesterol '200mg'$ , calories 30% from fat and 7% or less from saturated fats, daily to have 5 or more servings of fruits and vegetables.  Biometrics:    Nutrition Therapy Plan and Nutrition Goals:     Nutrition Therapy & Goals - 02/03/15 1629    Nutrition Therapy   Diet 1800 calorie meal plan for diabetes; DASH diet principles   Drug/Food Interactions Statins/Certain Fruits   Fiber 20 grams   Whole Grain Foods 3 servings   Protein 9 ounces/day   Saturated Fats 12 max. grams   Fruits and Vegetables 5 servings/day    Personal Nutrition Goals   Personal Goal #1 Include breakfast every morning that includes a protein food and at least 2 carbohydrate servings.   Personal Goal #2 Read labels for saturated fat, trans fat and sodium   Personal Goal #3 Read labels for carbohydrate grams. Keep meals in range of 45-60 gms and snacks in range of 15-30 gms.      Nutrition Discharge: Rate Your Plate Scores:     Rate Your Plate - 56/38/93 7342    Rate Your Plate Scores   Pre Score 77   Pre Score % 88 %      Nutrition Goals Re-Evaluation:     Nutrition Goals Re-Evaluation      01/06/15 1011 02/01/15 1117 02/07/15 1048 03/07/15 1309     Personal Goal #1 Re-Evaluation   Personal Goal #1 Is eating healtier Working on healthy eating habits Include breakfast every morning that includes a protein food and at least 2 carbohydrate servings. Include breakfast every morning that includes a protein food and at least 2 carbohydrate servings.    Goal Progress Seen Yes Yes Yes     Comments  Continues to work on healthy eating.  No sugar being used since left hospital Continues to work on healthy eating.  No sugar being used since left hospital Continues to work on healthy eating.  No sugar being used since left hospital    Personal Goal #2 Re-Evaluation   Personal Goal #2   Read labels for saturated fat, trans fat and sodium Read labels for saturated fat, trans fat and sodium    Goal Progress Seen    Yes    Comments    Doing best to read labels    Personal Goal #3 Re-Evaluation   Personal Goal #3   Read labels for carbohydrate grams. Keep meals in range of 45-60 gms and snacks in range of 15-30 gms. Read labels  for carbohydrate grams. Keep meals in range of 45-60 gms and snacks in range of 15-30 gms.    Goal Progress Seen    Yes    Comments    Doing best to read labels       Psychosocial: Target Goals: Acknowledge presence or absence of depression, maximize coping skills, provide positive support system. Participant  is able to verbalize types and ability to use techniques and skills needed for reducing stress and depression.  Initial Review & Psychosocial Screening:     Initial Psych Review & Screening - 12/22/14 [place]? Yes   Barriers   Psychosocial barriers to participate in program There are no identifiable barriers or psychosocial needs.   Screening Interventions   Interventions Encouraged to exercise      Quality of Life Scores:     Quality of Life - 02/07/15 1049    Quality of Life Scores   Health/Function Pre 22.25 %   Socioeconomic Pre 23.86 %   Psych/Spiritual Pre 23.5 %   Family Pre 27 %   GLOBAL Pre 23.58 %      PHQ-9:     Recent Review Flowsheet Data    Depression screen [facility] 2/9 12/22/2014   Decreased Interest 0   Down, Depressed, Hopeless 0   PHQ - 2 Score 0   Altered sleeping 2   Tired, decreased energy 0   Change in appetite 0   Feeling bad or failure about yourself  0   Trouble concentrating 0   Moving slowly or fidgety/restless 0   Suicidal thoughts 0   PHQ-9 Score 2      Psychosocial Evaluation and Intervention:     Psychosocial Evaluation - 12/22/14 1734    Psychosocial Evaluation & Interventions   Interventions Encouraged to exercise with the program and follow exercise prescription      Psychosocial Re-Evaluation:   Vocational Rehabilitation: Provide vocational rehab assistance to qualifying candidates.   Vocational Rehab Evaluation & Intervention:     Vocational Rehab - 12/22/14 1728    Initial Vocational Rehab Evaluation & Intervention   Assessment shows need for Vocational Rehabilitation No      Education: Education Goals: Education classes will be provided on a weekly basis, covering required topics. Participant will state understanding/return demonstration of topics presented.  Learning Barriers/Preferences:     Learning Barriers/Preferences - 12/22/14 1728    Learning Barriers/Preferences    Learning Barriers None   Learning Preferences None      Education Topics: General Nutrition Guidelines/Fats and Fiber: -Group instruction provided by verbal, written material, models and posters to present the general guidelines for heart healthy nutrition. Gives an explanation and review of dietary fats and fiber.          Cardiac Rehab from 02/15/2015 in [facility] Cardiac Rehab   Date  02/06/15   Educator  CR   Instruction Review Code  2- meets goals/outcomes      Controlling Sodium/Reading Food Labels: -Group verbal and written material supporting the discussion of sodium use in heart healthy nutrition. Review and explanation with models, verbal and written materials for utilization of the food label.   Exercise Physiology & Risk Factors: - Group verbal and written instruction with models to review the exercise physiology of the cardiovascular system and associated critical values. Details cardiovascular disease risk factors and the goals associated with each risk factor.   Aerobic Exercise & Resistance Training: - Gives group verbal and written  discussion on the health impact of inactivity. On the components of aerobic and resistive training programs and the benefits of this training and how to safely progress through these programs.      Cardiac Rehab from 01/09/2015 in [facility] Cardiac Rehab   Date  01/09/15   Educator  Dorisann Frames   Instruction Review Code  2- meets goals/outcomes      Flexibility, Balance, General Exercise Guidelines: - Provides group verbal and written instruction on the benefits of flexibility and balance training programs. Provides general exercise guidelines with specific guidelines to those with heart or lung disease. Demonstration and skill practice provided.      Cardiac Rehab from 02/15/2015 in [facility] Cardiac Rehab   Date  01/11/15   Educator  RM   Instruction Review Code  2- meets goals/outcomes      Stress Management: - Provides group verbal and  written instruction about the health risks of elevated stress, cause of high stress, and healthy ways to reduce stress.      Cardiac Rehab from 12/28/2014 in [facility] Cardiac Rehab   Date  12/28/14   Educator  Lucianne Lei, LCSW   Instruction Review Code  2- meets goals/outcomes      Depression: - Provides group verbal and written instruction on the correlation between heart/lung disease and depressed mood, treatment options, and the stigmas associated with seeking treatment.      Cardiac Rehab from 02/15/2015 in [facility] Cardiac Rehab   Date  02/15/15   Educator  [facility]   Instruction Review Code  2- meets goals/outcomes      Anatomy & Physiology of the Heart: - Group verbal and written instruction and models provide basic cardiac anatomy and physiology, with the coronary electrical and arterial systems. Review of: AMI, Angina, Valve disease, Heart Failure, Cardiac Arrhythmia, Pacemakers, and the ICD.      Cardiac Rehab from 02/15/2015 in [facility] Cardiac Rehab   Date  01/23/15   Educator  S. Bice   Instruction Review Code  2- meets goals/outcomes      Cardiac Procedures: - Group verbal and written instruction and models to describe the testing methods done to diagnose heart disease. Reviews the outcomes of the test results. Describes the treatment choices: Medical Management, Angioplasty, or Coronary Bypass Surgery.   Cardiac Medications: - Group verbal and written instruction to review commonly prescribed medications for heart disease. Reviews the medication, class of the drug, and side effects. Includes the steps to properly store meds and maintain the prescription regimen.      Cardiac Rehab from 01/09/2015 in [facility] Cardiac Rehab   Date  01/02/15   Educator  SB   Instruction Review Code  2- meets goals/outcomes      Go Sex-Intimacy & Heart Disease, Get SMART - Goal Setting: - Group verbal and written instruction through game format to discuss heart disease and the return to sexual intimacy.  Provides group verbal and written material to discuss and apply goal setting through the application of the S.M.A.R.T. Method.   Other Matters of the Heart: - Provides group verbal, written materials and models to describe Heart Failure, Angina, Valve Disease, and Diabetes in the realm of heart disease. Includes description of the disease process and treatment options available to the cardiac patient.      Cardiac Rehab from 02/15/2015 in [facility] Cardiac Rehab   Date  02/01/15   Educator  SB   Instruction Review Code  2- meets goals/outcomes      Exercise &  Equipment Safety: - Individual verbal instruction and demonstration of equipment use and safety with use of the equipment.      Cardiac Rehab from 12/22/2014 in [facility] Cardiac Rehab   Date  12/22/14   Educator  C. Buffie Herne   Instruction Review Code  1- partially meets, needs review/practice      Infection Prevention: - Provides verbal and written material to individual with discussion of infection control including proper hand washing and proper equipment cleaning during exercise session.      Cardiac Rehab from 12/22/2014 in [facility] Cardiac Rehab   Date  12/22/14   Educator  C.Olajuwon Fosdick, RN   Instruction Review Code  2- meets goals/outcomes      Falls Prevention: - Provides verbal and written material to individual with discussion of falls prevention and safety.      Cardiac Rehab from 12/22/2014 in [facility] Cardiac Rehab   Date  12/22/14   Educator  C.Romello Hoehn   Instruction Review Code  2- meets goals/outcomes      Diabetes: - Individual verbal and written instruction to review signs/symptoms of diabetes, desired ranges of glucose level fasting, after meals and with exercise. Advice that pre and post exercise glucose checks will be done for 3 sessions at entry of program.    Knowledge Questionnaire Score:   Personal Goals and Risk Factors at Admission:     Personal Goals and Risk Factors at Admission - 12/22/14 1732    Personal  Goals and Risk Factors on Admission    Weight Management Yes   Intervention Learn and follow the exercise and diet guidelines while in the program. Utilize the nutrition and education classes to help gain knowledge of the diet and exercise expectations in the program   Increase Aerobic Exercise and Physical Activity Yes   Intervention While in program, learn and follow the exercise prescription taught. Start at a low level workload and increase workload after able to maintain previous level for 30 minutes. Increase time before increasing intensity.   Take Less Medication Yes   Intervention Learn your risk factors and begin the lifestyle modifications for risk factor control during your time in the program.   Understand more about Heart/Pulmonary Disease. Yes   Intervention While in program utilize professionals for any questions, and attend the education sessions. Great websites to use are www.americanheart.org or www.lung.org for reliable information.   Diabetes Yes   Goal Blood glucose control identified by blood glucose values, HgbA1C. Participant verbalizes understanding of the signs/symptoms of hyper/hypo glycemia, proper foot care and importance of medication and nutrition plan for blood glucose control.   Intervention Provide nutrition & aerobic exercise along with prescribed medications to achieve blood glucose in normal ranges: Fasting 65-99 mg/dL      Personal Goals and Risk Factors Review:      Goals and Risk Factor Review      01/06/15 1012 02/01/15 1119 02/07/15 1048 03/07/15 1310     Weight Management   Goals Progress/Improvement seen  Yes Yes Yes    Comments  Weight is down since admission to program Weight is down since admission to program weight maintianing    Increase Aerobic Exercise and Physical Activity   Goals Progress/Improvement seen  Yes Yes Yes Yes    Comments Doing well on Elliptical and Treadmill Doing well with progression and interval training Doing well with  progression and interval training Continues to do well with exercise progression. Has been out a couple weeks    Take Less Medication  Goals Progress/Improvement seen  No No No    Comments  Understands this is a long term goal,to continue to pursue goal by maintianing the guidelines for nutrition and exerxise learned in program. Understands this is a long term goal,to continue to pursue goal by maintianing the guidelines for nutrition and exerxise learned in program. Understands this is a long term goal,to continue to pursue goal by maintianing the guidelines for nutrition and exerxise learned in program.    Understand more about Heart/Pulmonary Disease   Goals Progress/Improvement seen   Yes Yes Yes    Comments  Attending classes and asking questions as needed. Attending classes and asking questions as needed. Attending classes and asking questions as needed.    Diabetes   Goal   Blood glucose control identified by blood glucose values, HgbA1C. Participant verbalizes understanding of the signs/symptoms of hyper/hypo glycemia, proper foot care and importance of medication and nutrition plan for blood glucose control. Blood glucose control identified by blood glucose values, HgbA1C. Participant verbalizes understanding of the signs/symptoms of hyper/hypo glycemia, proper foot care and importance of medication and nutrition plan for blood glucose control.    Progress seen towards goals  Yes Yes Yes    Comments  Blood sugar control is improved since nutrition changes and exercise regimen. Blood sugar control is improved since nutrition changes and exercise regimen. Blood sugar control is improved since nutrition changes and exercise regimen.       Personal Goals Discharge:     Comments: Has been out a while.

## 2015-04-25 ENCOUNTER — Telehealth: Payer: Self-pay | Admitting: *Deleted

## 2015-04-25 ENCOUNTER — Telehealth: Payer: Self-pay

## 2015-04-25 NOTE — Telephone Encounter (Signed)
You may refill any of his cardiac prescriptions

## 2015-04-25 NOTE — Telephone Encounter (Signed)
Received records request from [place], Leonides Sake , forwarded to [facility] for processing.Marland Kitchen

## 2015-04-25 NOTE — Telephone Encounter (Signed)
Received records request from [employer], [place], forwarded to [facility] for processing.

## 2015-04-25 NOTE — Telephone Encounter (Signed)
Routing to michelle to see if pt is suppose to be seen in [place] or if he is ok with [place], most recent OV was in [place]

## 2015-04-27 ENCOUNTER — Ambulatory Visit (INDEPENDENT_AMBULATORY_CARE_PROVIDER_SITE_OTHER): Payer: BC Managed Care – PPO | Admitting: Cardiovascular Disease

## 2015-04-27 ENCOUNTER — Encounter: Payer: Self-pay | Admitting: Cardiovascular Disease

## 2015-04-27 VITALS — BP 102/68 | HR 54 | Ht 71.0 in | Wt 244.0 lb

## 2015-04-27 DIAGNOSIS — I2510 Atherosclerotic heart disease of native coronary artery without angina pectoris: Secondary | ICD-10-CM | POA: Diagnosis not present

## 2015-04-27 DIAGNOSIS — E785 Hyperlipidemia, unspecified: Secondary | ICD-10-CM

## 2015-04-27 DIAGNOSIS — I5022 Chronic systolic (congestive) heart failure: Secondary | ICD-10-CM | POA: Diagnosis not present

## 2015-04-27 DIAGNOSIS — I2102 ST elevation (STEMI) myocardial infarction involving left anterior descending coronary artery: Secondary | ICD-10-CM

## 2015-04-27 DIAGNOSIS — Z951 Presence of aortocoronary bypass graft: Secondary | ICD-10-CM

## 2015-04-27 DIAGNOSIS — E784 Other hyperlipidemia: Secondary | ICD-10-CM

## 2015-04-27 LAB — HEPATIC FUNCTION PANEL
ALBUMIN: 4.2 g/dL (ref 3.5–5.2)
ALT: 28 U/L (ref 0–53)
AST: 19 U/L (ref 0–37)
Alkaline Phosphatase: 81 U/L (ref 39–117)
Bilirubin, Direct: 0.3 mg/dL (ref 0.0–0.3)
Total Bilirubin: 1.5 mg/dL — ABNORMAL HIGH (ref 0.2–1.2)
Total Protein: 6.9 g/dL (ref 6.0–8.3)

## 2015-04-27 LAB — BASIC METABOLIC PANEL
BUN: 18 mg/dL (ref 6–23)
CALCIUM: 9.6 mg/dL (ref 8.4–10.5)
CO2: 29 mEq/L (ref 19–32)
Chloride: 103 mEq/L (ref 96–112)
Creatinine, Ser: 1.03 mg/dL (ref 0.40–1.50)
GFR: 79.84 mL/min (ref >60.00)
Glucose, Bld: 98 mg/dL (ref 70–99)
Potassium: 4.3 mEq/L (ref 3.5–5.1)
Sodium: 138 mEq/L (ref 135–145)

## 2015-04-27 LAB — LIPID PANEL
Cholesterol: 93 mg/dL (ref 0–200)
HDL: 28.3 mg/dL — ABNORMAL LOW (ref >39.00)
LDL Cholesterol: 49 mg/dL (ref 0–99)
NonHDL: 64.38
Total CHOL/HDL Ratio: 3
Triglycerides: 78 mg/dL (ref 0.0–149.0)
VLDL: 15.6 mg/dL (ref 0.0–40.0)

## 2015-04-27 NOTE — Progress Notes (Signed)
Cardiology Office Note   Date:  04/27/2015   ID:  Seth Riddle, DOB Jul 14, 1961, MRN [id number]  PCP:  Seth Fennel, MD  Cardiologist:   Seth Headings, MD   Chief Complaint  Patient presents with  . Follow-up    CAD      History of Present Illness: Seth Riddle is a 54 y.o. male who presents for follow-up for his recent hospitalization for coronary artery disease, myocardial infarction and subsequent coronary artery bypass grafting. His admitted with symptoms of unstable angina. Cardiac catheterization the following day revealed significant three-vessel coronary artery disease.  The left main coronary artery was angiographically normal and bifurcated into the LAD and left circumflex coronary artery.  he LAD was a large caliber vessel and immmediately gave rise to a very high diagonal (ramus intermediate like vessel). There was diffuse 95% stenosis in the LAD in the region of this diagonal takeoff with 90-95% stenosis diffusely just beyond the origin of this ramus like vessel and 90% diffuse stenosis at the origin of the ramus like high diagonal vessel almost immediately after the left main. The left circumflex coronary artery was a moderate size vessel that gave rise to one major marginal branch. There was 95% focal stenosis just beyond an ectatic segment in the proximal obtuse marginal branch and there was diffuse distal 90% marginal stenosis. The RCA was angiographically normal , dominant vessel that gave rise to a large PDA and PLA vessel.  Left ventriculography revealed normal global LV contractility without focal segmental wall motion abnormalities. There was no evidence for mitral regurgitation. Ejection fraction is 55-60%.    Unfortunately, the night after his cardiac catheterization he occluded his left anterior descending artery and had ongoing chest pain. He returned to the Cath Lab the following morning an emergency balloon angioplasty  was performed to open  his LAD.    He was taken to emergent coronary artery bypass grafting at that time. Had a relatively slow recovery but has done fairly well. Echo done prior to DC revealed mild LV dysfunction:  Left ventricle: The cavity size was normal. Wall thickness was increased in a pattern of mild LVH. Systolic function was mildly reduced. The estimated ejection fraction was in the range of 45% to 50%. Diffuse hypokinesis. Doppler parameters are consistent with abnormal left ventricular relaxation (grade 1 diastolic dysfunction). - Right ventricle: The cavity size was mildly dilated. - Right atrium: The atrium was mildly dilated.  Impressions:  - Mild global reduction in LV function (EF 50); grade 1 diastolic dysfunction; mild RAE/RVE.    CABG  Left internal mammary artery to left anterior descending Saphenous vein graft to first diagonal Saphenous vein graft to first obtuse marginal   He has had significant right leg pain since the initial cath. He's had back surgery and the pain feels like a slipped disc.  Jan 05, 2015: Seth Riddle is doing well from a cardiac standpoint. He continues to have severe leg pain. He apparently had some injury that occurred following the initial cardiac catheterization prior to his bypass surgery. His echo from this week shows improved LV systolic function.  - Left ventricle: The cavity size was normal. Wall thickness was increased in a pattern of mild LVH. Systolic function was mildly to moderately reduced. The estimated ejection fraction was in the range of 40% to 45%. Akinesis of the apical myocardium. Moderate hypokinesis of the apicalanterior myocardium.  Sept. 8, 2016:  doing ok. Still has low stamina  Walking some.  Had been running on the treadmill at cardiac rehab.     Past Medical History  Diagnosis Date  . Dyslipidemia (high LDL; low HDL)   . Family history of premature CAD   . Coronary artery disease   . Low  HDL (under 40)   . Diabetes mellitus without complication   . MI (myocardial infarction)     Past Surgical History  Procedure Laterality Date  . Left heart catheterization with coronary angiogram N/A 11/14/2014    Procedure: LEFT HEART CATHETERIZATION WITH CORONARY ANGIOGRAM;  Surgeon: Troy Sine, MD;  Location: [facility] CATH LAB;  Service: Cardiovascular;  Laterality: N/A;  . Intra-aortic balloon pump insertion N/A 11/15/2014    Procedure: INTRA-AORTIC BALLOON PUMP INSERTION;  Surgeon: Troy Sine, MD;  Location: [facility] CATH LAB;  Service: Cardiovascular;  Laterality: N/A;  . Percutaneous coronary stent intervention (pci-s) N/A 11/15/2014    Procedure: PERCUTANEOUS CORONARY STENT INTERVENTION (PCI-S);  Surgeon: Troy Sine, MD;  Location: [facility] CATH LAB;  Service: Cardiovascular;  Laterality: N/A;  . Coronary artery bypass graft N/A 11/15/2014    Procedure: CORONARY ARTERY BYPASS GRAFTING (CABG), ON PUMP, TIMES THREE, USING LEFT INTERNAL MAMMARY ARTERY, RIGHT GREATER SAPHENOUS VEIN HARVESTED ENDOSCOPICALLY;  Surgeon: Melrose Nakayama, MD;  Location: [facility];  Service: Open Heart Surgery;  Laterality: N/A;  . Tee without cardioversion N/A 11/15/2014    Procedure: TRANSESOPHAGEAL ECHOCARDIOGRAM (TEE);  Surgeon: Melrose Nakayama, MD;  Location: [facility];  Service: Open Heart Surgery;  Laterality: N/A;     Current Outpatient Prescriptions  Medication Sig Dispense Refill  . acetaminophen (TYLENOL) 500 MG tablet Take 500 mg by mouth every 6 (six) hours as needed (pain).     Marland Kitchen aspirin EC 81 MG tablet Take 1 tablet (81 mg total) by mouth daily. 90 tablet 3  . atorvastatin (LIPITOR) 80 MG tablet Take 1 tablet (80 mg total) by mouth daily at 6 PM. 30 tablet 1  . blood glucose meter kit and supplies KIT Dispense based on patient and insurance preference. Use up to four times daily as directed. (FOR ICD-9 250.00, 250.01). 1 each 0  . gabapentin (NEURONTIN) 300 MG capsule Take 1 capsule (300 mg total) by  mouth 3 (three) times daily. 90 capsule 1  . lisinopril (PRINIVIL,ZESTRIL) 10 MG tablet Take 1 tablet (10 mg total) by mouth daily. 30 tablet 1  . MELATONIN PO Take 1 tablet by mouth daily as needed (sleep).    . metFORMIN (GLUCOPHAGE) 500 MG tablet Take 500 mg by mouth 2 (two) times daily with a meal.    . metoprolol tartrate (LOPRESSOR) 25 MG tablet Take 1 tablet (25 mg total) by mouth 2 (two) times daily. 60 tablet 1  . oxyCODONE (OXY IR/ROXICODONE) 5 MG immediate release tablet Take 1 tablet (5 mg total) by mouth 4 (four) times daily as needed for severe pain. 30 tablet 0   No current facility-administered medications for this visit.    Allergies:   Coconut oil    Social History:  The patient  reports that he has never smoked. He has never used smokeless tobacco. He reports that he does not drink alcohol or use illicit drugs.   Family History:  The patient's family history includes Heart attack in his father; Heart disease in his mother; Stroke in his father and mother.    ROS:  Please see the history of present illness.    Review of Systems: Constitutional:  denies fever, chills, diaphoresis, appetite change and fatigue.  HEENT: denies photophobia, eye pain, redness, hearing loss, ear pain, congestion, sore throat, rhinorrhea, sneezing, neck pain, neck stiffness and tinnitus.  Respiratory: denies SOB, DOE, cough, chest tightness, and wheezing.  Cardiovascular: denies chest pain, palpitations and leg swelling.  Gastrointestinal: denies nausea, vomiting, abdominal pain, diarrhea, constipation, blood in stool.  Genitourinary: denies dysuria, urgency, frequency, hematuria, flank pain and difficulty urinating.  Musculoskeletal: admits to  myalgias, back pain / significant right leg pain   Skin: denies pallor, rash and wound.  Neurological: denies dizziness, seizures, syncope, weakness, light-headedness, numbness and headaches.   Hematological: denies adenopathy, easy bruising, personal  or family bleeding history.  Psychiatric/ Behavioral: denies suicidal ideation, mood changes, confusion, nervousness, sleep disturbance and agitation.       All other systems are reviewed and negative.    PHYSICAL EXAM: VS:  BP 102/68 mmHg  Pulse 54  Ht $R'5\' 11"'DZ$  (1.803 m)  Wt 110.678 kg (244 lb)  BMI 34.05 kg/m2  SpO2 97% , BMI Body mass index is 34.05 kg/(m^2). GEN: Well nourished, well developed, in no acute distress HEENT: normal Neck: no JVD, carotid bruits, or masses Cardiac: RRR; no murmurs, rubs, or gallops,no edema . His sternotomy scar is healing well.  The thoracostomy sites are healing well. Respiratory:  clear to auscultation bilaterally, normal work of breathing.  GI: soft, nontender, nondistended, + BS MS: no deformity or atrophyThe saphenous vein harvest sites are healing well. Skin: warm and dry, no rash Neuro:  Strength and sensation are intact Psych: normal   EKG:  EKG is ordered today.   Recent Labs: 11/16/2014: Magnesium 2.0 11/18/2014: ALT 39 11/19/2014: BUN 29*; Creatinine, Ser 0.98; Hemoglobin 12.7*; Platelets 145*; Potassium 4.0; Sodium 139    Lipid Panel    Component Value Date/Time   CHOL 174 11/14/2014 0350   TRIG 168* 11/14/2014 0350   HDL 24* 11/14/2014 0350   CHOLHDL 7.3 11/14/2014 0350   VLDL 34 11/14/2014 0350   LDLCALC 116* 11/14/2014 0350      Wt Readings from Last 3 Encounters:  04/27/15 110.678 kg (244 lb)  01/05/15 110.564 kg (243 lb 12 oz)  12/13/14 111.131 kg (245 lb)      Other studies Reviewed: Additional studies/ records that were reviewed today include: . Review of the above records demonstrates:    ASSESSMENT AND PLAN:  1.  Coronary artery disease: He status post coronary artery bypass grafting. -LIMA to LAD -SVG to DIAGONAL 1 -SVG to OM1  He's not having any episodes of angina. We will continue the current dose of aspirin. I anticipate decreasing his aspirin to 81 mg a day .  I'll see him again in 6  months. Will check fasting labs today    2. Chronic systolic congestive heart failure: The patient had an acute anterior wall myocardial infarction resulting in decreased LV function. His echocardiogram shows that his left ventricle systolic function has improved since his second heart cavitation. I do not have an inaccurate LV EF but the description from Dr. Roxan Hockey  describes an akinetic anterior wall. His ejection fraction is now 40-45% and he does not need an ICD .  Marland Kitchen He'll continue with the metoprolol and lisinopril for now.    3. Hyperlipidemia: Continue atorvastatin and will also refer him to a nutritionist. - Amy Hager  Check labs today    4. Leg pain :  Has gradually improved.  Walking regularly .    Current medicines are reviewed at length with the patient today.  The patient  does not have concerns regarding medicines.  The following changes have been made:  no change  Labs/ tests ordered today include:   No orders of the defined types were placed in this encounter.     Disposition:   FU with me in 6  months.    Signed, Shalicia Craghead, Wonda Cheng, MD  04/27/2015 9:08 AM    [address], [place], [place]  [address] Phone: [phone]; Fax: [phone]

## 2015-04-27 NOTE — Patient Instructions (Signed)

## 2015-04-28 ENCOUNTER — Telehealth: Payer: Self-pay

## 2015-04-28 NOTE — Telephone Encounter (Signed)
spoke with patient about recent lab results. pt verbalized understanding and will continue on current treatment and exercise plan.

## 2015-04-28 NOTE — Telephone Encounter (Signed)
-----   Message from Vesta Mixer, MD sent at 04/27/2015  5:33 PM EDT ----- Labs look great. HDL is low.  Continue exercise.

## 2015-05-02 ENCOUNTER — Encounter: Payer: Self-pay | Admitting: *Deleted

## 2015-05-02 DIAGNOSIS — Z951 Presence of aortocoronary bypass graft: Secondary | ICD-10-CM

## 2015-05-02 NOTE — Progress Notes (Signed)
Cardiac Individual Treatment Plan  Patient Details  Name: Seth Riddle MRN: [id number] Date of Birth: November 29, 1960 Referring Provider:  Thayer Headings, MD  Initial Encounter Date:    Visit Diagnosis: S/P CABG (coronary artery bypass graft)  Patient's Home Medications on Admission:  Current outpatient prescriptions:  .  acetaminophen (TYLENOL) 500 MG tablet, Take 500 mg by mouth every 6 (six) hours as needed (pain). , Disp: , Rfl:  .  aspirin EC 81 MG tablet, Take 1 tablet (81 mg total) by mouth daily., Disp: 90 tablet, Rfl: 3 .  atorvastatin (LIPITOR) 80 MG tablet, Take 1 tablet (80 mg total) by mouth daily at 6 PM., Disp: 30 tablet, Rfl: 1 .  blood glucose meter kit and supplies KIT, Dispense based on patient and insurance preference. Use up to four times daily as directed. (FOR ICD-9 250.00, 250.01)., Disp: 1 each, Rfl: 0 .  gabapentin (NEURONTIN) 300 MG capsule, Take 1 capsule (300 mg total) by mouth 3 (three) times daily., Disp: 90 capsule, Rfl: 1 .  lisinopril (PRINIVIL,ZESTRIL) 10 MG tablet, Take 1 tablet (10 mg total) by mouth daily., Disp: 30 tablet, Rfl: 1 .  MELATONIN PO, Take 1 tablet by mouth daily as needed (sleep)., Disp: , Rfl:  .  metFORMIN (GLUCOPHAGE) 500 MG tablet, Take 500 mg by mouth 2 (two) times daily with a meal., Disp: , Rfl:  .  metoprolol tartrate (LOPRESSOR) 25 MG tablet, Take 1 tablet (25 mg total) by mouth 2 (two) times daily., Disp: 60 tablet, Rfl: 1 .  oxyCODONE (OXY IR/ROXICODONE) 5 MG immediate release tablet, Take 1 tablet (5 mg total) by mouth 4 (four) times daily as needed for severe pain., Disp: 30 tablet, Rfl: 0  Past Medical History: Past Medical History  Diagnosis Date  . Dyslipidemia (high LDL; low HDL)   . Family history of premature CAD   . Coronary artery disease   . Low HDL (under 40)   . Diabetes mellitus without complication   . MI (myocardial infarction)     Tobacco Use: History  Smoking status  . Never Smoker   Smokeless  tobacco  . Never Used    Labs: Recent Review Flowsheet Data    Labs for ITP Cardiac and Pulmonary Rehab Latest Ref Rng 11/16/2014 11/16/2014 11/16/2014 11/16/2014 04/27/2015   Cholestrol 0 - 200 mg/dL - - - - 93   LDLCALC 0 - 99 mg/dL - - - - 49   HDL >39.00 mg/dL - - - - 28.30(L)   Trlycerides 0.0 - 149.0 mg/dL - - - - 78.0   PHART 7.350 - 7.450 7.375 7.312(L) 7.379 - -   PCO2ART 35.0 - 45.0 mmHg 38.3 41.4 38.7 - -   HCO3 20.0 - 24.0 mEq/L 22.1 20.8 22.8 - -   TCO2 0 - 100 mmol/L _0 -   ACIDBASEDEF 0.0 - 2.0 mmol/L 2.0 5.0(H) 2.0 - -   O2SAT - 93.0 91.0 90.0 - -       Exercise Target Goals:    Exercise Program Goal: Individual exercise prescription set with THRR, safety & activity barriers. Participant demonstrates ability to understand and report RPE using BORG scale, to self-measure pulse accurately, and to acknowledge the importance of the exercise prescription.  Exercise Prescription Goal: Starting with aerobic activity 30 plus minutes a day, 3 days per week for initial exercise prescription. Provide home exercise prescription and guidelines that participant acknowledges understanding prior to discharge.  Activity Barriers & Risk Stratification:  Activity Barriers & Risk Stratification - 12/22/14 1728    Activity Barriers & Risk Stratification   Activity Barriers None   Risk Stratification High      6 Minute Walk:     6 Minute Walk      12/22/14 1535       6 Minute Walk   Distance 1680 feet     Walk Time 6 minutes     Resting HR 66 bpm     Resting BP 124/64 mmHg     Max Ex. HR 87 bpm     Max Ex. BP 132/74 mmHg     RPE 11     Perceived Dyspnea  1        Initial Exercise Prescription:     Initial Exercise Prescription - 12/22/14 1500    Treadmill   MPH 2.5   Grade 0   Minutes 15   Bike   Level 1   Minutes 10   Recumbant Bike   Level 2   Watts 50   Minutes 10   NuStep   Level 3   Watts 50   Minutes 10   Arm Ergometer   Level 1    Watts 10   Minutes 10   Arm/Foot Ergometer   Level 1   Watts 12   Minutes 10   Cybex   Level 1   RPM 40   Minutes 10   Recumbant Elliptical   Level 1   RPM 50   Watts 30   Minutes 10   Elliptical   Level 1   Speed 3   Minutes 2   REL-XR   Level 3   Watts 50   Minutes 10   Prescription Details   Frequency (times per week) 3   Duration Progress to 30 minutes of continuous aerobic without signs/symptoms of physical distress   Intensity   THRR REST +  30   Ratings of Perceived Exertion 11-15   Perceived Dyspnea 2-4   Progression Continue progressive overload as per policy without signs/symptoms or physical distress.   Resistance Training   Training Prescription Yes   Weight 2   Reps 10-12      Exercise Prescription Changes:     Exercise Prescription Changes      12/30/14 0900 01/09/15 1300 01/10/15 0600 01/20/15 0900 01/25/15 0800   Exercise Review   Progression  Yes Yes Yes Yes   Response to Exercise   Blood Pressure (Admit)  102/72 mmHg 102/72 mmHg 102/72 mmHg    Blood Pressure (Exercise)  140/82 mmHg 140/82 mmHg 140/82 mmHg    Blood Pressure (Exit)  118/60 mmHg 118/60 mmHg 118/60 mmHg    Heart Rate (Admit)  57 bpm 57 bpm 57 bpm    Heart Rate (Exercise)  116 bpm 116 bpm 116 bpm    Heart Rate (Exit)  78 bpm 78 bpm 78 bpm    Rating of Perceived Exertion (Exercise)  _0 Resistance Training   Training Prescription   Yes Yes Yes   Weight   _1 Reps   10-12 10-12 10-12   Interval Training   Interval Training   Yes Yes Yes   Equipment   Treadmill Treadmill Treadmill   Comments   4.2/0 3 min, 4.5/0 30 sec - 1 min and then drop back to 4.2/0 and repeat 4.2/0 3 min, 4.5/0 all at 1% incline 30 sec - 1 min and then drop back to  4.2/0 and repeat  Add 1 day of exercise at home up to 5 mph @ 2%   Treadmill   MPH 4 4.2 4.2 4.2    Grade 0 0 0 0    Minutes  _0 Bike   Level  1.1 1.1 1.1    Minutes  _1 Elliptical   Level  _2 Speed   _3 Minutes  _4 01/27/15 0900 02/06/15 1700 02/21/15 0700 03/27/15 0800 05/02/15 0700   Exercise Review   Progression  Yes Yes No  Absent since last review No  Absent since last review; last visit 02/17/15   Response to Exercise   Blood Pressure (Admit)  130/70 mmHg 102/70 mmHg 102/70 mmHg 102/70 mmHg   Blood Pressure (Exercise)  144/80 mmHg 140/74 mmHg 140/74 mmHg 140/74 mmHg   Blood Pressure (Exit)  106/62 mmHg 118/72 mmHg 118/72 mmHg 118/72 mmHg   Heart Rate (Admit)  71 bpm 78 bpm 78 bpm 78 bpm   Heart Rate (Exercise)  101 bpm 116 bpm 116 bpm 116 bpm   Heart Rate (Exit)  89 bpm 73 bpm 73 bpm 73 bpm   Rating of Perceived Exertion (Exercise)  _5 Symptoms  No No No No   Frequency  Add 1 additional day to program exercise sessions. Add 2 additional days to program exercise sessions. Add 2 additional days to program exercise sessions. Add 2 additional days to program exercise sessions.   Duration  Progress to 50 minutes of aerobic without signs/symptoms of physical distress Progress to 50 minutes of aerobic without signs/symptoms of physical distress Progress to 50 minutes of aerobic without signs/symptoms of physical distress Progress to 50 minutes of aerobic without signs/symptoms of physical distress   Intensity  Rest + 30 THRR New  115-157 (40-85% HRR) THRR New  115-157 (40-85% HRR) THRR New  115-157 (40-85% HRR)   Progression  Continue progressive overload as per policy without signs/symptoms or physical distress. Continue progressive overload as per policy without signs/symptoms or physical distress. Continue progressive overload as per policy without signs/symptoms or physical distress. Continue progressive overload as per policy without signs/symptoms or physical distress.   Resistance Training   Training Prescription _6    Weight _7 Reps 10-12 10-15 10-15 10-15 10-15   Interval Training   Interval Training  Yes Yes Yes Yes    Equipment  Treadmill Treadmill Treadmill Treadmill   Comments  4.2/2 3 min, 5.2/2 all at 1% incline 30 sec - 1 min and then drop back to 4.2/0 and repeat  Add 1 day of exercise at home 4.2/2 3 min, 5.2/2 all at 1% incline 30 sec - 1 min and then drop back to 4.2/0 and repeat  Add 1 day of exercise at home 4.2/2 3 min, 5.2/2 all at 1% incline 30 sec - 1 min and then drop back to 4.2/0 and repeat  Add 1 day of exercise at home 4.2/2 3 min, 5.2/2 all at 1% incline 30 sec - 1 min and then drop back to 4.2/0 and repeat  Add 1 day of exercise at home   Treadmill   MPH  4.2 5.2 5.2 5.2   Grade  _8 Minutes  _9 Bike   Level  1.1 1.1 1.1 1.1   Minutes  _0 Elliptical   Level  _1 Speed  _2 Minutes  _3 Discharge Exercise Prescription (Final Exercise Prescription Changes):     Exercise Prescription Changes - 05/02/15 0700    Exercise Review   Progression No  Absent since last review; last visit 02/17/15   Response to Exercise   Blood Pressure (Admit) 102/70 mmHg   Blood Pressure (Exercise) 140/74 mmHg   Blood Pressure (Exit) 118/72 mmHg   Heart Rate (Admit) 78 bpm   Heart Rate (Exercise) 116 bpm   Heart Rate (Exit) 73 bpm   Rating of Perceived Exertion (Exercise) 15   Symptoms No   Frequency Add 2 additional days to program exercise sessions.   Duration Progress to 50 minutes of aerobic without signs/symptoms of physical distress   Intensity THRR New  115-157 (40-85% HRR)   Progression Continue progressive overload as per policy without signs/symptoms or physical distress.   Resistance Training   Training Prescription Yes   Weight 10   Reps 10-15   Interval Training   Interval Training Yes   Equipment Treadmill   Comments 4.2/2 3 min, 5.2/2 all at 1% incline 30 sec - 1 min and then drop back to 4.2/0 and repeat  Add 1 day of exercise at home   Treadmill   MPH 5.2   Grade 2   Minutes 20   Bike   Level 1.1   Minutes 15    Elliptical   Level 6   Speed 4   Minutes 15      Nutrition:  Target Goals: Understanding of nutrition guidelines, daily intake of sodium <1516m, cholesterol <2056m calories 30% from fat and 7% or less from saturated fats, daily to have 5 or more servings of fruits and vegetables.  Biometrics:    Nutrition Therapy Plan and Nutrition Goals:     Nutrition Therapy & Goals - 02/03/15 1629    Nutrition Therapy   Diet 1800 calorie meal plan for diabetes; DASH diet principles   Drug/Food Interactions Statins/Certain Fruits   Fiber 20 grams   Whole Grain Foods 3 servings   Protein 9 ounces/day   Saturated Fats 12 max. grams   Fruits and Vegetables 5 servings/day   Personal Nutrition Goals   Personal Goal #1 Include breakfast every morning that includes a protein food and at least 2 carbohydrate servings.   Personal Goal #2 Read labels for saturated fat, trans fat and sodium   Personal Goal #3 Read labels for carbohydrate grams. Keep meals in range of 45-60 gms and snacks in range of 15-30 gms.      Nutrition Discharge: Rate Your Plate Scores:     Rate Your Plate - 0686/77/3703668  Rate Your Plate Scores   Pre Score 77   Pre Score % 88 %      Nutrition Goals Re-Evaluation:     Nutrition Goals Re-Evaluation      01/06/15 1011 02/01/15 1117 02/07/15 1048 03/07/15 1309     Personal Goal #1 Re-Evaluation   Personal Goal #1 Is eating healtier Working on healthy eating habits Include breakfast every morning that includes a protein food and at least 2 carbohydrate servings. Include breakfast every morning that includes a protein food and at least 2 carbohydrate servings.    Goal Progress Seen Yes Yes Yes  Comments  Continues to work on healthy eating.  No sugar being used since left hospital Continues to work on healthy eating.  No sugar being used since left hospital Continues to work on healthy eating.  No sugar being used since left hospital    Personal Goal #2  Re-Evaluation   Personal Goal #2   Read labels for saturated fat, trans fat and sodium Read labels for saturated fat, trans fat and sodium    Goal Progress Seen    Yes    Comments    Doing best to read labels    Personal Goal #3 Re-Evaluation   Personal Goal #3   Read labels for carbohydrate grams. Keep meals in range of 45-60 gms and snacks in range of 15-30 gms. Read labels for carbohydrate grams. Keep meals in range of 45-60 gms and snacks in range of 15-30 gms.    Goal Progress Seen    Yes    Comments    Doing best to read labels       Psychosocial: Target Goals: Acknowledge presence or absence of depression, maximize coping skills, provide positive support system. Participant is able to verbalize types and ability to use techniques and skills needed for reducing stress and depression.  Initial Review & Psychosocial Screening:     Initial Psych Review & Screening - 12/22/14 [place]? Yes   Barriers   Psychosocial barriers to participate in program There are no identifiable barriers or psychosocial needs.   Screening Interventions   Interventions Encouraged to exercise      Quality of Life Scores:     Quality of Life - 02/07/15 1049    Quality of Life Scores   Health/Function Pre 22.25 %   Socioeconomic Pre 23.86 %   Psych/Spiritual Pre 23.5 %   Family Pre 27 %   GLOBAL Pre 23.58 %      PHQ-9:     Recent Review Flowsheet Data    Depression screen [facility] 2/9 12/22/2014   Decreased Interest 0   Down, Depressed, Hopeless 0   PHQ - 2 Score 0   Altered sleeping 2   Tired, decreased energy 0   Change in appetite 0   Feeling bad or failure about yourself  0   Trouble concentrating 0   Moving slowly or fidgety/restless 0   Suicidal thoughts 0   PHQ-9 Score 2      Psychosocial Evaluation and Intervention:     Psychosocial Evaluation - 12/22/14 1734    Psychosocial Evaluation & Interventions   Interventions Encouraged to  exercise with the program and follow exercise prescription      Psychosocial Re-Evaluation:   Vocational Rehabilitation: Provide vocational rehab assistance to qualifying candidates.   Vocational Rehab Evaluation & Intervention:     Vocational Rehab - 12/22/14 1728    Initial Vocational Rehab Evaluation & Intervention   Assessment shows need for Vocational Rehabilitation No      Education: Education Goals: Education classes will be provided on a weekly basis, covering required topics. Participant will state understanding/return demonstration of topics presented.  Learning Barriers/Preferences:     Learning Barriers/Preferences - 12/22/14 1728    Learning Barriers/Preferences   Learning Barriers None   Learning Preferences None      Education Topics: General Nutrition Guidelines/Fats and Fiber: -Group instruction provided by verbal, written material, models and posters to present the general guidelines for heart healthy nutrition. Gives an explanation and review of  dietary fats and fiber.          Cardiac Rehab from 02/15/2015 in [facility] Cardiac Rehab   Date  02/06/15   Educator  CR   Instruction Review Code  2- meets goals/outcomes      Controlling Sodium/Reading Food Labels: -Group verbal and written material supporting the discussion of sodium use in heart healthy nutrition. Review and explanation with models, verbal and written materials for utilization of the food label.   Exercise Physiology & Risk Factors: - Group verbal and written instruction with models to review the exercise physiology of the cardiovascular system and associated critical values. Details cardiovascular disease risk factors and the goals associated with each risk factor.   Aerobic Exercise & Resistance Training: - Gives group verbal and written discussion on the health impact of inactivity. On the components of aerobic and resistive training programs and the benefits of this training and how  to safely progress through these programs.      Cardiac Rehab from 01/09/2015 in [facility] Cardiac Rehab   Date  01/09/15   Educator  Dorisann Frames   Instruction Review Code  2- meets goals/outcomes      Flexibility, Balance, General Exercise Guidelines: - Provides group verbal and written instruction on the benefits of flexibility and balance training programs. Provides general exercise guidelines with specific guidelines to those with heart or lung disease. Demonstration and skill practice provided.      Cardiac Rehab from 02/15/2015 in [facility] Cardiac Rehab   Date  01/11/15   Educator  RM   Instruction Review Code  2- meets goals/outcomes      Stress Management: - Provides group verbal and written instruction about the health risks of elevated stress, cause of high stress, and healthy ways to reduce stress.      Cardiac Rehab from 12/28/2014 in [facility] Cardiac Rehab   Date  12/28/14   Educator  Lucianne Lei, LCSW   Instruction Review Code  2- meets goals/outcomes      Depression: - Provides group verbal and written instruction on the correlation between heart/lung disease and depressed mood, treatment options, and the stigmas associated with seeking treatment.      Cardiac Rehab from 02/15/2015 in [facility] Cardiac Rehab   Date  02/15/15   Educator  [facility]   Instruction Review Code  2- meets goals/outcomes      Anatomy & Physiology of the Heart: - Group verbal and written instruction and models provide basic cardiac anatomy and physiology, with the coronary electrical and arterial systems. Review of: AMI, Angina, Valve disease, Heart Failure, Cardiac Arrhythmia, Pacemakers, and the ICD.      Cardiac Rehab from 02/15/2015 in [facility] Cardiac Rehab   Date  01/23/15   Educator  S. Bice   Instruction Review Code  2- meets goals/outcomes      Cardiac Procedures: - Group verbal and written instruction and models to describe the testing methods done to diagnose heart disease. Reviews the outcomes of the  test results. Describes the treatment choices: Medical Management, Angioplasty, or Coronary Bypass Surgery.   Cardiac Medications: - Group verbal and written instruction to review commonly prescribed medications for heart disease. Reviews the medication, class of the drug, and side effects. Includes the steps to properly store meds and maintain the prescription regimen.      Cardiac Rehab from 01/09/2015 in [facility] Cardiac Rehab   Date  01/02/15   Educator  SB   Instruction Review Code  2- meets goals/outcomes  Go Sex-Intimacy & Heart Disease, Get SMART - Goal Setting: - Group verbal and written instruction through game format to discuss heart disease and the return to sexual intimacy. Provides group verbal and written material to discuss and apply goal setting through the application of the S.M.A.R.T. Method.   Other Matters of the Heart: - Provides group verbal, written materials and models to describe Heart Failure, Angina, Valve Disease, and Diabetes in the realm of heart disease. Includes description of the disease process and treatment options available to the cardiac patient.      Cardiac Rehab from 02/15/2015 in [facility] Cardiac Rehab   Date  02/01/15   Educator  SB   Instruction Review Code  2- meets goals/outcomes      Exercise & Equipment Safety: - Individual verbal instruction and demonstration of equipment use and safety with use of the equipment.      Cardiac Rehab from 12/22/2014 in [facility] Cardiac Rehab   Date  12/22/14   Educator  C. Enterkin   Instruction Review Code  1- partially meets, needs review/practice      Infection Prevention: - Provides verbal and written material to individual with discussion of infection control including proper hand washing and proper equipment cleaning during exercise session.      Cardiac Rehab from 12/22/2014 in [facility] Cardiac Rehab   Date  12/22/14   Educator  C.Enterkin, RN   Instruction Review Code  2- meets goals/outcomes      Falls  Prevention: - Provides verbal and written material to individual with discussion of falls prevention and safety.      Cardiac Rehab from 12/22/2014 in [facility] Cardiac Rehab   Date  12/22/14   Educator  C.Enterkin   Instruction Review Code  2- meets goals/outcomes      Diabetes: - Individual verbal and written instruction to review signs/symptoms of diabetes, desired ranges of glucose level fasting, after meals and with exercise. Advice that pre and post exercise glucose checks will be done for 3 sessions at entry of program.    Knowledge Questionnaire Score:   Personal Goals and Risk Factors at Admission:     Personal Goals and Risk Factors at Admission - 12/22/14 1732    Personal Goals and Risk Factors on Admission    Weight Management Yes   Intervention Learn and follow the exercise and diet guidelines while in the program. Utilize the nutrition and education classes to help gain knowledge of the diet and exercise expectations in the program   Increase Aerobic Exercise and Physical Activity Yes   Intervention While in program, learn and follow the exercise prescription taught. Start at a low level workload and increase workload after able to maintain previous level for 30 minutes. Increase time before increasing intensity.   Take Less Medication Yes   Intervention Learn your risk factors and begin the lifestyle modifications for risk factor control during your time in the program.   Understand more about Heart/Pulmonary Disease. Yes   Intervention While in program utilize professionals for any questions, and attend the education sessions. Great websites to use are www.americanheart.org or www.lung.org for reliable information.   Diabetes Yes   Goal Blood glucose control identified by blood glucose values, HgbA1C. Participant verbalizes understanding of the signs/symptoms of hyper/hypo glycemia, proper foot care and importance of medication and nutrition plan for blood glucose control.    Intervention Provide nutrition & aerobic exercise along with prescribed medications to achieve blood glucose in normal ranges: Fasting 65-99 mg/dL  Personal Goals and Risk Factors Review:      Goals and Risk Factor Review      01/06/15 1012 02/01/15 1119 02/07/15 1048 03/07/15 1310     Weight Management   Goals Progress/Improvement seen  Yes Yes Yes    Comments  Weight is down since admission to program Weight is down since admission to program weight maintianing    Increase Aerobic Exercise and Physical Activity   Goals Progress/Improvement seen  Yes Yes Yes Yes    Comments Doing well on Elliptical and Treadmill Doing well with progression and interval training Doing well with progression and interval training Continues to do well with exercise progression. Has been out a couple weeks    Take Less Medication   Goals Progress/Improvement seen  No No No    Comments  Understands this is a long term goal,to continue to pursue goal by maintianing the guidelines for nutrition and exerxise learned in program. Understands this is a long term goal,to continue to pursue goal by maintianing the guidelines for nutrition and exerxise learned in program. Understands this is a long term goal,to continue to pursue goal by maintianing the guidelines for nutrition and exerxise learned in program.    Understand more about Heart/Pulmonary Disease   Goals Progress/Improvement seen   Yes Yes Yes    Comments  Attending classes and asking questions as needed. Attending classes and asking questions as needed. Attending classes and asking questions as needed.    Diabetes   Goal   Blood glucose control identified by blood glucose values, HgbA1C. Participant verbalizes understanding of the signs/symptoms of hyper/hypo glycemia, proper foot care and importance of medication and nutrition plan for blood glucose control. Blood glucose control identified by blood glucose values, HgbA1C. Participant verbalizes  understanding of the signs/symptoms of hyper/hypo glycemia, proper foot care and importance of medication and nutrition plan for blood glucose control.    Progress seen towards goals  Yes Yes Yes    Comments  Blood sugar control is improved since nutrition changes and exercise regimen. Blood sugar control is improved since nutrition changes and exercise regimen. Blood sugar control is improved since nutrition changes and exercise regimen.       Personal Goals Discharge:     Comments: 30 day review Sho has been out for medical reasons, plans to return when released to exercise.

## 2015-05-03 ENCOUNTER — Other Ambulatory Visit: Payer: Self-pay | Admitting: *Deleted

## 2015-05-03 DIAGNOSIS — Z951 Presence of aortocoronary bypass graft: Secondary | ICD-10-CM

## 2015-05-11 ENCOUNTER — Telehealth: Payer: Self-pay

## 2015-05-11 NOTE — Telephone Encounter (Signed)
Received records request from [facility], 2nd request, forwarded to [facility] for processing.

## 2015-05-23 ENCOUNTER — Encounter: Payer: Self-pay | Admitting: *Deleted

## 2015-05-25 ENCOUNTER — Telehealth: Payer: Self-pay | Admitting: *Deleted

## 2015-05-25 NOTE — Telephone Encounter (Signed)
I called Seth Riddle and asked him how he was doing and he said he is doing ok. 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 I mentioned to him the different class times and he believes that the 3:45pm class time might work better for him now.  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

## 2015-05-25 NOTE — Progress Notes (Signed)
Cardiac Individual Treatment Plan  Patient Details  Name: Seth Riddle MRN: [id number] Date of Birth: May 28, 1961 Referring Provider:  No ref. provider found  Initial Encounter Date:    Visit Diagnosis: No diagnosis found.  Patient's Home Medications on Admission:  Current outpatient prescriptions:  .  acetaminophen (TYLENOL) 500 MG tablet, Take 500 mg by mouth every 6 (six) hours as needed (pain). , Disp: , Rfl:  .  aspirin EC 81 MG tablet, Take 1 tablet (81 mg total) by mouth daily., Disp: 90 tablet, Rfl: 3 .  atorvastatin (LIPITOR) 80 MG tablet, Take 1 tablet (80 mg total) by mouth daily at 6 PM., Disp: 30 tablet, Rfl: 1 .  blood glucose meter kit and supplies KIT, Dispense based on patient and insurance preference. Use up to four times daily as directed. (FOR ICD-9 250.00, 250.01)., Disp: 1 each, Rfl: 0 .  gabapentin (NEURONTIN) 300 MG capsule, Take 1 capsule (300 mg total) by mouth 3 (three) times daily., Disp: 90 capsule, Rfl: 1 .  lisinopril (PRINIVIL,ZESTRIL) 10 MG tablet, Take 1 tablet (10 mg total) by mouth daily., Disp: 30 tablet, Rfl: 1 .  MELATONIN PO, Take 1 tablet by mouth daily as needed (sleep)., Disp: , Rfl:  .  metFORMIN (GLUCOPHAGE) 500 MG tablet, Take 500 mg by mouth 2 (two) times daily with a meal., Disp: , Rfl:  .  metoprolol tartrate (LOPRESSOR) 25 MG tablet, Take 1 tablet (25 mg total) by mouth 2 (two) times daily., Disp: 60 tablet, Rfl: 1 .  oxyCODONE (OXY IR/ROXICODONE) 5 MG immediate release tablet, Take 1 tablet (5 mg total) by mouth 4 (four) times daily as needed for severe pain., Disp: 30 tablet, Rfl: 0  Past Medical History: Past Medical History  Diagnosis Date  . Dyslipidemia (high LDL; low HDL)   . Family history of premature CAD   . Coronary artery disease   . Low HDL (under 40)   . Diabetes mellitus without complication   . MI (myocardial infarction)     Tobacco Use: History  Smoking status  . Never Smoker   Smokeless tobacco  . Never  Used    Labs: Recent Review Flowsheet Data    Labs for ITP Cardiac and Pulmonary Rehab Latest Ref Rng 11/16/2014 11/16/2014 11/16/2014 11/16/2014 04/27/2015   Cholestrol 0 - 200 mg/dL - - - - 93   LDLCALC 0 - 99 mg/dL - - - - 49   HDL >39.00 mg/dL - - - - 28.30(L)   Trlycerides 0.0 - 149.0 mg/dL - - - - 78.0   PHART 7.350 - 7.450 7.375 7.312(L) 7.379 - -   PCO2ART 35.0 - 45.0 mmHg 38.3 41.4 38.7 - -   HCO3 20.0 - 24.0 mEq/L 22.1 20.8 22.8 - -   TCO2 0 - 100 mmol/L _0 -   ACIDBASEDEF 0.0 - 2.0 mmol/L 2.0 5.0(H) 2.0 - -   O2SAT - 93.0 91.0 90.0 - -       Exercise Target Goals:    Exercise Program Goal: Individual exercise prescription set with THRR, safety & activity barriers. Participant demonstrates ability to understand and report RPE using BORG scale, to self-measure pulse accurately, and to acknowledge the importance of the exercise prescription.  Exercise Prescription Goal: Starting with aerobic activity 30 plus minutes a day, 3 days per week for initial exercise prescription. Provide home exercise prescription and guidelines that participant acknowledges understanding prior to discharge.  Activity Barriers & Risk Stratification:     Activity  Barriers & Risk Stratification - 12/22/14 1728    Activity Barriers & Risk Stratification   Activity Barriers None   Risk Stratification High      6 Minute Walk:     6 Minute Walk      12/22/14 1535       6 Minute Walk   Distance 1680 feet     Walk Time 6 minutes     Resting HR 66 bpm     Resting BP 124/64 mmHg     Max Ex. HR 87 bpm     Max Ex. BP 132/74 mmHg     RPE 11     Perceived Dyspnea  1        Initial Exercise Prescription:     Initial Exercise Prescription - 12/22/14 1500    Treadmill   MPH 2.5   Grade 0   Minutes 15   Bike   Level 1   Minutes 10   Recumbant Bike   Level 2   Watts 50   Minutes 10   NuStep   Level 3   Watts 50   Minutes 10   Arm Ergometer   Level 1   Watts 10    Minutes 10   Arm/Foot Ergometer   Level 1   Watts 12   Minutes 10   Cybex   Level 1   RPM 40   Minutes 10   Recumbant Elliptical   Level 1   RPM 50   Watts 30   Minutes 10   Elliptical   Level 1   Speed 3   Minutes 2   REL-XR   Level 3   Watts 50   Minutes 10   Prescription Details   Frequency (times per week) 3   Duration Progress to 30 minutes of continuous aerobic without signs/symptoms of physical distress   Intensity   THRR REST +  30   Ratings of Perceived Exertion 11-15   Perceived Dyspnea 2-4   Progression Continue progressive overload as per policy without signs/symptoms or physical distress.   Resistance Training   Training Prescription Yes   Weight 2   Reps 10-12      Exercise Prescription Changes:     Exercise Prescription Changes      12/30/14 0900 01/09/15 1300 01/10/15 0600 01/20/15 0900 01/25/15 0800   Exercise Review   Progression  Yes Yes Yes Yes   Response to Exercise   Blood Pressure (Admit)  102/72 mmHg 102/72 mmHg 102/72 mmHg    Blood Pressure (Exercise)  140/82 mmHg 140/82 mmHg 140/82 mmHg    Blood Pressure (Exit)  118/60 mmHg 118/60 mmHg 118/60 mmHg    Heart Rate (Admit)  57 bpm 57 bpm 57 bpm    Heart Rate (Exercise)  116 bpm 116 bpm 116 bpm    Heart Rate (Exit)  78 bpm 78 bpm 78 bpm    Rating of Perceived Exertion (Exercise)  _0 Resistance Training   Training Prescription   Yes Yes Yes   Weight   _1 Reps   10-12 10-12 10-12   Interval Training   Interval Training   Yes Yes Yes   Equipment   Treadmill Treadmill Treadmill   Comments   4.2/0 3 min, 4.5/0 30 sec - 1 min and then drop back to 4.2/0 and repeat 4.2/0 3 min, 4.5/0 all at 1% incline 30 sec - 1 min and then drop back to 4.2/0  and repeat  Add 1 day of exercise at home up to 5 mph @ 2%   Treadmill   MPH 4 4.2 4.2 4.2    Grade 0 0 0 0    Minutes  _0 Bike   Level  1.1 1.1 1.1    Minutes  _1 Elliptical   Level  _2 Speed  _3 Minutes  _4 01/27/15 0900 02/06/15 1700 02/21/15 0700 03/27/15 0800 05/02/15 0700   Exercise Review   Progression  Yes Yes No  Absent since last review No  Absent since last review; last visit 02/17/15   Response to Exercise   Blood Pressure (Admit)  130/70 mmHg 102/70 mmHg 102/70 mmHg 102/70 mmHg   Blood Pressure (Exercise)  144/80 mmHg 140/74 mmHg 140/74 mmHg 140/74 mmHg   Blood Pressure (Exit)  106/62 mmHg 118/72 mmHg 118/72 mmHg 118/72 mmHg   Heart Rate (Admit)  71 bpm 78 bpm 78 bpm 78 bpm   Heart Rate (Exercise)  101 bpm 116 bpm 116 bpm 116 bpm   Heart Rate (Exit)  89 bpm 73 bpm 73 bpm 73 bpm   Rating of Perceived Exertion (Exercise)  _5 Symptoms  No No No No   Frequency  Add 1 additional day to program exercise sessions. Add 2 additional days to program exercise sessions. Add 2 additional days to program exercise sessions. Add 2 additional days to program exercise sessions.   Duration  Progress to 50 minutes of aerobic without signs/symptoms of physical distress Progress to 50 minutes of aerobic without signs/symptoms of physical distress Progress to 50 minutes of aerobic without signs/symptoms of physical distress Progress to 50 minutes of aerobic without signs/symptoms of physical distress   Intensity  Rest + 30 THRR New  115-157 (40-85% HRR) THRR New  115-157 (40-85% HRR) THRR New  115-157 (40-85% HRR)   Progression  Continue progressive overload as per policy without signs/symptoms or physical distress. Continue progressive overload as per policy without signs/symptoms or physical distress. Continue progressive overload as per policy without signs/symptoms or physical distress. Continue progressive overload as per policy without signs/symptoms or physical distress.   Resistance Training   Training Prescription _6    Weight _7 Reps 10-12 10-15 10-15 10-15 10-15   Interval Training   Interval Training  Yes Yes Yes Yes   Equipment   Treadmill Treadmill Treadmill Treadmill   Comments  4.2/2 3 min, 5.2/2 all at 1% incline 30 sec - 1 min and then drop back to 4.2/0 and repeat  Add 1 day of exercise at home 4.2/2 3 min, 5.2/2 all at 1% incline 30 sec - 1 min and then drop back to 4.2/0 and repeat  Add 1 day of exercise at home 4.2/2 3 min, 5.2/2 all at 1% incline 30 sec - 1 min and then drop back to 4.2/0 and repeat  Add 1 day of exercise at home 4.2/2 3 min, 5.2/2 all at 1% incline 30 sec - 1 min and then drop back to 4.2/0 and repeat  Add 1 day of exercise at home   Treadmill   MPH  4.2 5.2 5.2 5.2   Grade  _8 Minutes  _9 Bike   Level  1.1 1.1 1.1 1.1   Minutes  _0 Elliptical   Level  _1 Speed  _2 Minutes  _3 05/23/15 0700           Exercise Review   Progression No  Absent since last review; last visit 02/17/15       Response to Exercise   Duration Progress to 50 minutes of aerobic without signs/symptoms of physical distress       Intensity THRR New  115-157 (40-85% HRR)       Progression Continue progressive overload as per policy without signs/symptoms or physical distress.       Resistance Training   Training Prescription Yes       Weight 10       Reps 10-15       Interval Training   Interval Training Yes       Equipment Treadmill       Comments 4.2/2 3 min, 5.2/2 all at 1% incline 30 sec - 1 min and then drop back to 4.2/0 and repeat  Add 1 day of exercise at home       Treadmill   MPH 5.2       Grade 2       Minutes 20       Bike   Level 1.1       Minutes 15       Elliptical   Level 6       Speed 4       Minutes 15          Discharge Exercise Prescription (Final Exercise Prescription Changes):     Exercise Prescription Changes - 05/23/15 0700    Exercise Review   Progression No  Absent since last review; last visit 02/17/15   Response to Exercise   Duration Progress to 50 minutes of aerobic without signs/symptoms of physical  distress   Intensity THRR New  115-157 (40-85% HRR)   Progression Continue progressive overload as per policy without signs/symptoms or physical distress.   Resistance Training   Training Prescription Yes   Weight 10   Reps 10-15   Interval Training   Interval Training Yes   Equipment Treadmill   Comments 4.2/2 3 min, 5.2/2 all at 1% incline 30 sec - 1 min and then drop back to 4.2/0 and repeat  Add 1 day of exercise at home   Treadmill   MPH 5.2   Grade 2   Minutes 20   Bike   Level 1.1   Minutes 15   Elliptical   Level 6   Speed 4   Minutes 15      Nutrition:  Target Goals: Understanding of nutrition guidelines, daily intake of sodium <1562m, cholesterol <2022m calories 30% from fat and 7% or less from saturated fats, daily to have 5 or more servings of fruits and vegetables.  Biometrics:    Nutrition Therapy Plan and Nutrition Goals:     Nutrition Therapy & Goals - 02/03/15 1629    Nutrition Therapy   Diet 1800 calorie meal plan for diabetes; DASH diet principles   Drug/Food Interactions Statins/Certain Fruits   Fiber 20 grams   Whole Grain Foods 3 servings   Protein 9 ounces/day   Saturated Fats 12 max. grams   Fruits and Vegetables 5 servings/day   Personal Nutrition Goals   Personal Goal #1 Include  breakfast every morning that includes a protein food and at least 2 carbohydrate servings.   Personal Goal #2 Read labels for saturated fat, trans fat and sodium   Personal Goal #3 Read labels for carbohydrate grams. Keep meals in range of 45-60 gms and snacks in range of 15-30 gms.      Nutrition Discharge: Rate Your Plate Scores:     Rate Your Plate - 98/11/91 4782    Rate Your Plate Scores   Pre Score 77   Pre Score % 88 %      Nutrition Goals Re-Evaluation:     Nutrition Goals Re-Evaluation      01/06/15 1011 02/01/15 1117 02/07/15 1048 03/07/15 1309 05/25/15 1606   Personal Goal #1 Re-Evaluation   Personal Goal #1 Is eating healtier Working  on healthy eating habits Include breakfast every morning that includes a protein food and at least 2 carbohydrate servings. Include breakfast every morning that includes a protein food and at least 2 carbohydrate servings.    Goal Progress Seen Yes Yes Yes  Yes   Comments  Continues to work on healthy eating.  No sugar being used since left hospital Continues to work on healthy eating.  No sugar being used since left hospital Continues to work on healthy eating.  No sugar being used since left hospital I called Seth Riddle and asked him how he was doing and he said he is doing ok. 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 I mentioned to him the different class times and he believes that the 3:45pm class time might work better for him now.  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 Seth Riddle is trying to eat good while he is traveling for work.    Personal Goal #2 Re-Evaluation   Personal Goal #2   Read labels for saturated fat, trans fat and sodium Read labels for saturated fat, trans fat and sodium    Goal Progress Seen    Yes    Comments    Doing best to read labels    Personal Goal #3 Re-Evaluation   Personal Goal #3   Read labels for carbohydrate grams. Keep meals in range of 45-60 gms and snacks in range of 15-30 gms. Read labels for carbohydrate grams. Keep meals in range of 45-60 gms and snacks in range of 15-30 gms.    Goal Progress Seen    Yes    Comments    Doing best to read labels       Psychosocial: Target Goals: Acknowledge presence or absence of depression, maximize coping skills, provide positive support system. Participant is able to verbalize types and ability to use techniques and skills needed for reducing stress and  depression.  Initial Review & Psychosocial Screening:     Initial Psych Review & Screening - 12/22/14 [place]? Yes   Barriers   Psychosocial barriers to participate in program There are no identifiable barriers or psychosocial needs.   Screening Interventions   Interventions Encouraged to exercise      Quality of Life Scores:     Quality of Life - 02/07/15 1049  Quality of Life Scores   Health/Function Pre 22.25 %   Socioeconomic Pre 23.86 %   Psych/Spiritual Pre 23.5 %   Family Pre 27 %   GLOBAL Pre 23.58 %      PHQ-9:     Recent Review Flowsheet Data    Depression screen [facility] 2/9 12/22/2014   Decreased Interest 0   Down, Depressed, Hopeless 0   PHQ - 2 Score 0   Altered sleeping 2   Tired, decreased energy 0   Change in appetite 0   Feeling bad or failure about yourself  0   Trouble concentrating 0   Moving slowly or fidgety/restless 0   Suicidal thoughts 0   PHQ-9 Score 2      Psychosocial Evaluation and Intervention:     Psychosocial Evaluation - 12/22/14 1734    Psychosocial Evaluation & Interventions   Interventions Encouraged to exercise with the program and follow exercise prescription      Psychosocial Re-Evaluation:     Psychosocial Re-Evaluation      05/25/15 1608           Psychosocial Re-Evaluation   Interventions Encouraged to attend Cardiac Rehabilitation for the exercise       Comments I called Seth Riddle and asked him how he was doing and he said he is doing ok. 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 I mentioned to him the different class times and he believes that the 3:45pm class time might work better for him now.  Seth Riddle said he will check his work calendar this week and call us and let us know when he can return  to Cardiac Rehab.           Vocational Rehabilitation: Provide vocational rehab assistance to qualifying candidates.   Vocational Rehab Evaluation & Intervention:     Vocational Rehab - 12/22/14 1728    Initial Vocational Rehab Evaluation & Intervention   Assessment shows need for Vocational Rehabilitation No      Education: Education Goals: Education classes will be provided on a weekly basis, covering required topics. Participant will state understanding/return demonstration of topics presented.  Learning Barriers/Preferences:     Learning Barriers/Preferences - 12/22/14 1728    Learning Barriers/Preferences   Learning Barriers None   Learning Preferences None      Education Topics: General Nutrition Guidelines/Fats and Fiber: -Group instruction provided by verbal, written material, models and posters to present the general guidelines for heart healthy nutrition. Gives an explanation and review of dietary fats and fiber.          Cardiac Rehab from 02/15/2015 in [facility] Cardiac Rehab   Date  02/06/15   Educator  CR   Instruction Review Code  2- meets goals/outcomes      Controlling Sodium/Reading Food Labels: -Group verbal and written material supporting the discussion of sodium use in heart healthy nutrition. Review and explanation with models, verbal and written materials for utilization of the food label.   Exercise Physiology & Risk Factors: - Group verbal and written instruction with models to review the exercise physiology of the cardiovascular system and associated critical values. Details cardiovascular disease risk factors and the goals associated with each risk factor.   Aerobic Exercise & Resistance Training: - Gives group verbal and written discussion  on the health impact of inactivity. On the components of aerobic and resistive training programs and the benefits of this training and how to safely progress through these programs.      Cardiac Rehab from  01/09/2015 in [facility] Cardiac Rehab   Date  01/09/15   Educator  Dorisann Frames   Instruction Review Code  2- meets goals/outcomes      Flexibility, Balance, General Exercise Guidelines: - Provides group verbal and written instruction on the benefits of flexibility and balance training programs. Provides general exercise guidelines with specific guidelines to those with heart or lung disease. Demonstration and skill practice provided.      Cardiac Rehab from 02/15/2015 in [facility] Cardiac Rehab   Date  01/11/15   Educator  RM   Instruction Review Code  2- meets goals/outcomes      Stress Management: - Provides group verbal and written instruction about the health risks of elevated stress, cause of high stress, and healthy ways to reduce stress.      Cardiac Rehab from 12/28/2014 in [facility] Cardiac Rehab   Date  12/28/14   Educator  Lucianne Lei, LCSW   Instruction Review Code  2- meets goals/outcomes      Depression: - Provides group verbal and written instruction on the correlation between heart/lung disease and depressed mood, treatment options, and the stigmas associated with seeking treatment.      Cardiac Rehab from 02/15/2015 in [facility] Cardiac Rehab   Date  02/15/15   Educator  [facility]   Instruction Review Code  2- meets goals/outcomes      Anatomy & Physiology of the Heart: - Group verbal and written instruction and models provide basic cardiac anatomy and physiology, with the coronary electrical and arterial systems. Review of: AMI, Angina, Valve disease, Heart Failure, Cardiac Arrhythmia, Pacemakers, and the ICD.      Cardiac Rehab from 02/15/2015 in [facility] Cardiac Rehab   Date  01/23/15   Educator  S. Bice   Instruction Review Code  2- meets goals/outcomes      Cardiac Procedures: - Group verbal and written instruction and models to describe the testing methods done to diagnose heart disease. Reviews the outcomes of the test results. Describes the treatment choices: Medical Management,  Angioplasty, or Coronary Bypass Surgery.   Cardiac Medications: - Group verbal and written instruction to review commonly prescribed medications for heart disease. Reviews the medication, class of the drug, and side effects. Includes the steps to properly store meds and maintain the prescription regimen.      Cardiac Rehab from 01/09/2015 in [facility] Cardiac Rehab   Date  01/02/15   Educator  SB   Instruction Review Code  2- meets goals/outcomes      Go Sex-Intimacy & Heart Disease, Get SMART - Goal Setting: - Group verbal and written instruction through game format to discuss heart disease and the return to sexual intimacy. Provides group verbal and written material to discuss and apply goal setting through the application of the S.M.A.R.T. Method.   Other Matters of the Heart: - Provides group verbal, written materials and models to describe Heart Failure, Angina, Valve Disease, and Diabetes in the realm of heart disease. Includes description of the disease process and treatment options available to the cardiac patient.      Cardiac Rehab from 02/15/2015 in [facility] Cardiac Rehab   Date  02/01/15   Educator  SB   Instruction Review Code  2- meets goals/outcomes      Exercise & Equipment  Safety: - Individual verbal instruction and demonstration of equipment use and safety with use of the equipment.      Cardiac Rehab from 12/22/2014 in [facility] Cardiac Rehab   Date  12/22/14   Educator  C. Maya Scholer   Instruction Review Code  1- partially meets, needs review/practice      Infection Prevention: - Provides verbal and written material to individual with discussion of infection control including proper hand washing and proper equipment cleaning during exercise session.      Cardiac Rehab from 12/22/2014 in [facility] Cardiac Rehab   Date  12/22/14   Educator  C.Crescencio Jozwiak, RN   Instruction Review Code  2- meets goals/outcomes      Falls Prevention: - Provides verbal and written material to individual  with discussion of falls prevention and safety.      Cardiac Rehab from 12/22/2014 in [facility] Cardiac Rehab   Date  12/22/14   Educator  C.Rylann Munford   Instruction Review Code  2- meets goals/outcomes      Diabetes: - Individual verbal and written instruction to review signs/symptoms of diabetes, desired ranges of glucose level fasting, after meals and with exercise. Advice that pre and post exercise glucose checks will be done for 3 sessions at entry of program.    Knowledge Questionnaire Score:   Personal Goals and Risk Factors at Admission:     Personal Goals and Risk Factors at Admission - 12/22/14 1732    Personal Goals and Risk Factors on Admission    Weight Management Yes   Intervention Learn and follow the exercise and diet guidelines while in the program. Utilize the nutrition and education classes to help gain knowledge of the diet and exercise expectations in the program   Increase Aerobic Exercise and Physical Activity Yes   Intervention While in program, learn and follow the exercise prescription taught. Start at a low level workload and increase workload after able to maintain previous level for 30 minutes. Increase time before increasing intensity.   Take Less Medication Yes   Intervention Learn your risk factors and begin the lifestyle modifications for risk factor control during your time in the program.   Understand more about Heart/Pulmonary Disease. Yes   Intervention While in program utilize professionals for any questions, and attend the education sessions. Great websites to use are www.americanheart.org or www.lung.org for reliable information.   Diabetes Yes   Goal Blood glucose control identified by blood glucose values, HgbA1C. Participant verbalizes understanding of the signs/symptoms of hyper/hypo glycemia, proper foot care and importance of medication and nutrition plan for blood glucose control.   Intervention Provide nutrition & aerobic exercise along with  prescribed medications to achieve blood glucose in normal ranges: Fasting 65-99 mg/dL      Personal Goals and Risk Factors Review:      Goals and Risk Factor Review      01/06/15 1012 02/01/15 1119 02/07/15 1048 03/07/15 1310 05/25/15 1607   Weight Management   Goals Progress/Improvement seen  Yes Yes Yes    Comments  Weight is down since admission to program Weight is down since admission to program weight maintianing    Increase Aerobic Exercise and Physical Activity   Goals Progress/Improvement seen  Yes Yes Yes Yes No   Comments Doing well on Elliptical and Treadmill Doing well with progression and interval training Doing well with progression and interval training Continues to do well with exercise progression. Has been out a couple weeks I called Seth Riddle and asked him how  he was doing and he said he is doing ok. 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 I mentioned to him the different class times and he believes that the 3:45pm class time might work better for him now.  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    Take Less Medication   Goals Progress/Improvement seen  No No No No   Comments  Understands this is a long term goal,to continue to pursue goal by maintianing the guidelines for nutrition and exerxise learned in program. Understands this is a long term goal,to continue to pursue goal by maintianing the guidelines for nutrition and exerxise learned in program. Understands this is a long term goal,to continue to pursue goal by maintianing the guidelines for nutrition and exerxise learned in program.    Understand more about Heart/Pulmonary Disease   Goals Progress/Improvement seen   Yes Yes Yes    Comments  Attending classes  and asking questions as needed. Attending classes and asking questions as needed. Attending classes and asking questions as needed.    Diabetes   Goal   Blood glucose control identified by blood glucose values, HgbA1C. Participant verbalizes understanding of the signs/symptoms of hyper/hypo glycemia, proper foot care and importance of medication and nutrition plan for blood glucose control. Blood glucose control identified by blood glucose values, HgbA1C. Participant verbalizes understanding of the signs/symptoms of hyper/hypo glycemia, proper foot care and importance of medication and nutrition plan for blood glucose control.    Progress seen towards goals  Yes Yes Yes    Comments  Blood sugar control is improved since nutrition changes and exercise regimen. Blood sugar control is improved since nutrition changes and exercise regimen. Blood sugar control is improved since nutrition changes and exercise regimen. Has been out since February 17, 2015      Personal Goals Discharge (Final Personal Goals and Risk Factors Review):      Goals and Risk Factor Review - 05/25/15 1607    Increase Aerobic Exercise and Physical Activity   Goals Progress/Improvement seen  No   Comments I called Seth Riddle and asked him how he was doing and he said he is doing ok. 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 I mentioned to him the different class times and he believes that the 3:45pm class time might work better for him now.  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    Take Less Medication   Goals Progress/Improvement seen No   Diabetes   Comments Has been out since February 17, 2015       Comments: I called Seth Riddle and asked him how he was doing and he said  he is doing ok. 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 Seth Riddle said he has been traveling for work the past 3.5  weeks but hopes to come back to finish up his Cardiac Rehab. I mentioned to him the different class times and he believes that the 3:45pm class time might work better for him now.  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

## 2015-05-31 ENCOUNTER — Encounter: Payer: Self-pay | Admitting: *Deleted

## 2015-05-31 DIAGNOSIS — Z951 Presence of aortocoronary bypass graft: Secondary | ICD-10-CM

## 2015-05-31 NOTE — Progress Notes (Signed)
Cardiac Individual Treatment Plan  Patient Details  Name: Seth Riddle MRN: [id number] Date of Birth: 1961/06/26 Referring Provider:  No ref. provider found  Initial Encounter Date:    Visit Diagnosis: S/P CABG (coronary artery bypass graft)  Patient's Home Medications on Admission:  Current outpatient prescriptions:  .  acetaminophen (TYLENOL) 500 MG tablet, Take 500 mg by mouth every 6 (six) hours as needed (pain). , Disp: , Rfl:  .  aspirin EC 81 MG tablet, Take 1 tablet (81 mg total) by mouth daily., Disp: 90 tablet, Rfl: 3 .  atorvastatin (LIPITOR) 80 MG tablet, Take 1 tablet (80 mg total) by mouth daily at 6 PM., Disp: 30 tablet, Rfl: 1 .  blood glucose meter kit and supplies KIT, Dispense based on patient and insurance preference. Use up to four times daily as directed. (FOR ICD-9 250.00, 250.01)., Disp: 1 each, Rfl: 0 .  gabapentin (NEURONTIN) 300 MG capsule, Take 1 capsule (300 mg total) by mouth 3 (three) times daily., Disp: 90 capsule, Rfl: 1 .  lisinopril (PRINIVIL,ZESTRIL) 10 MG tablet, Take 1 tablet (10 mg total) by mouth daily., Disp: 30 tablet, Rfl: 1 .  MELATONIN PO, Take 1 tablet by mouth daily as needed (sleep)., Disp: , Rfl:  .  metFORMIN (GLUCOPHAGE) 500 MG tablet, Take 500 mg by mouth 2 (two) times daily with a meal., Disp: , Rfl:  .  metoprolol tartrate (LOPRESSOR) 25 MG tablet, Take 1 tablet (25 mg total) by mouth 2 (two) times daily., Disp: 60 tablet, Rfl: 1 .  oxyCODONE (OXY IR/ROXICODONE) 5 MG immediate release tablet, Take 1 tablet (5 mg total) by mouth 4 (four) times daily as needed for severe pain., Disp: 30 tablet, Rfl: 0  Past Medical History: Past Medical History  Diagnosis Date  . Dyslipidemia (high LDL; low HDL)   . Family history of premature CAD   . Coronary artery disease   . Low HDL (under 40)   . Diabetes mellitus without complication   . MI (myocardial infarction)     Tobacco Use: History  Smoking status  . Never Smoker   Smokeless  tobacco  . Never Used    Labs: Recent Review Flowsheet Data    Labs for ITP Cardiac and Pulmonary Rehab Latest Ref Rng 11/16/2014 11/16/2014 11/16/2014 11/16/2014 04/27/2015   Cholestrol 0 - 200 mg/dL - - - - 93   LDLCALC 0 - 99 mg/dL - - - - 49   HDL >39.00 mg/dL - - - - 28.30(L)   Trlycerides 0.0 - 149.0 mg/dL - - - - 78.0   PHART 7.350 - 7.450 7.375 7.312(L) 7.379 - -   PCO2ART 35.0 - 45.0 mmHg 38.3 41.4 38.7 - -   HCO3 20.0 - 24.0 mEq/L 22.1 20.8 22.8 - -   TCO2 0 - 100 mmol/L _0 -   ACIDBASEDEF 0.0 - 2.0 mmol/L 2.0 5.0(H) 2.0 - -   O2SAT - 93.0 91.0 90.0 - -       Exercise Target Goals:    Exercise Program Goal: Individual exercise prescription set with THRR, safety & activity barriers. Participant demonstrates ability to understand and report RPE using BORG scale, to self-measure pulse accurately, and to acknowledge the importance of the exercise prescription.  Exercise Prescription Goal: Starting with aerobic activity 30 plus minutes a day, 3 days per week for initial exercise prescription. Provide home exercise prescription and guidelines that participant acknowledges understanding prior to discharge.  Activity Barriers & Risk Stratification:  Activity Barriers & Risk Stratification - 12/22/14 1728    Activity Barriers & Risk Stratification   Activity Barriers None   Risk Stratification High      6 Minute Walk:     6 Minute Walk      12/22/14 1535       6 Minute Walk   Distance 1680 feet     Walk Time 6 minutes     Resting HR 66 bpm     Resting BP 124/64 mmHg     Max Ex. HR 87 bpm     Max Ex. BP 132/74 mmHg     RPE 11     Perceived Dyspnea  1        Initial Exercise Prescription:     Initial Exercise Prescription - 12/22/14 1500    Treadmill   MPH 2.5   Grade 0   Minutes 15   Bike   Level 1   Minutes 10   Recumbant Bike   Level 2   Watts 50   Minutes 10   NuStep   Level 3   Watts 50   Minutes 10   Arm Ergometer   Level 1    Watts 10   Minutes 10   Arm/Foot Ergometer   Level 1   Watts 12   Minutes 10   Cybex   Level 1   RPM 40   Minutes 10   Recumbant Elliptical   Level 1   RPM 50   Watts 30   Minutes 10   Elliptical   Level 1   Speed 3   Minutes 2   REL-XR   Level 3   Watts 50   Minutes 10   Prescription Details   Frequency (times per week) 3   Duration Progress to 30 minutes of continuous aerobic without signs/symptoms of physical distress   Intensity   THRR REST +  30   Ratings of Perceived Exertion 11-15   Perceived Dyspnea 2-4   Progression Continue progressive overload as per policy without signs/symptoms or physical distress.   Resistance Training   Training Prescription Yes   Weight 2   Reps 10-12      Exercise Prescription Changes:     Exercise Prescription Changes      12/30/14 0900 01/09/15 1300 01/10/15 0600 01/20/15 0900 01/25/15 0800   Exercise Review   Progression  Yes Yes Yes Yes   Response to Exercise   Blood Pressure (Admit)  102/72 mmHg 102/72 mmHg 102/72 mmHg    Blood Pressure (Exercise)  140/82 mmHg 140/82 mmHg 140/82 mmHg    Blood Pressure (Exit)  118/60 mmHg 118/60 mmHg 118/60 mmHg    Heart Rate (Admit)  57 bpm 57 bpm 57 bpm    Heart Rate (Exercise)  116 bpm 116 bpm 116 bpm    Heart Rate (Exit)  78 bpm 78 bpm 78 bpm    Rating of Perceived Exertion (Exercise)  _0 Resistance Training   Training Prescription   Yes Yes Yes   Weight   _1 Reps   10-12 10-12 10-12   Interval Training   Interval Training   Yes Yes Yes   Equipment   Treadmill Treadmill Treadmill   Comments   4.2/0 3 min, 4.5/0 30 sec - 1 min and then drop back to 4.2/0 and repeat 4.2/0 3 min, 4.5/0 all at 1% incline 30 sec - 1 min and then drop back to  4.2/0 and repeat  Add 1 day of exercise at home up to 5 mph @ 2%   Treadmill   MPH 4 4.2 4.2 4.2    Grade 0 0 0 0    Minutes  _0 Bike   Level  1.1 1.1 1.1    Minutes  _1 Elliptical   Level  _2 Speed   _3 Minutes  _4 01/27/15 0900 02/06/15 1700 02/21/15 0700 03/27/15 0800 05/02/15 0700   Exercise Review   Progression  Yes Yes No  Absent since last review No  Absent since last review; last visit 02/17/15   Response to Exercise   Blood Pressure (Admit)  130/70 mmHg 102/70 mmHg 102/70 mmHg 102/70 mmHg   Blood Pressure (Exercise)  144/80 mmHg 140/74 mmHg 140/74 mmHg 140/74 mmHg   Blood Pressure (Exit)  106/62 mmHg 118/72 mmHg 118/72 mmHg 118/72 mmHg   Heart Rate (Admit)  71 bpm 78 bpm 78 bpm 78 bpm   Heart Rate (Exercise)  101 bpm 116 bpm 116 bpm 116 bpm   Heart Rate (Exit)  89 bpm 73 bpm 73 bpm 73 bpm   Rating of Perceived Exertion (Exercise)  _5 Symptoms  No No No No   Frequency  Add 1 additional day to program exercise sessions. Add 2 additional days to program exercise sessions. Add 2 additional days to program exercise sessions. Add 2 additional days to program exercise sessions.   Duration  Progress to 50 minutes of aerobic without signs/symptoms of physical distress Progress to 50 minutes of aerobic without signs/symptoms of physical distress Progress to 50 minutes of aerobic without signs/symptoms of physical distress Progress to 50 minutes of aerobic without signs/symptoms of physical distress   Intensity  Rest + 30 THRR New  115-157 (40-85% HRR) THRR New  115-157 (40-85% HRR) THRR New  115-157 (40-85% HRR)   Progression  Continue progressive overload as per policy without signs/symptoms or physical distress. Continue progressive overload as per policy without signs/symptoms or physical distress. Continue progressive overload as per policy without signs/symptoms or physical distress. Continue progressive overload as per policy without signs/symptoms or physical distress.   Resistance Training   Training Prescription _6    Weight _7 Reps 10-12 10-15 10-15 10-15 10-15   Interval Training   Interval Training  Yes Yes Yes Yes    Equipment  Treadmill Treadmill Treadmill Treadmill   Comments  4.2/2 3 min, 5.2/2 all at 1% incline 30 sec - 1 min and then drop back to 4.2/0 and repeat  Add 1 day of exercise at home 4.2/2 3 min, 5.2/2 all at 1% incline 30 sec - 1 min and then drop back to 4.2/0 and repeat  Add 1 day of exercise at home 4.2/2 3 min, 5.2/2 all at 1% incline 30 sec - 1 min and then drop back to 4.2/0 and repeat  Add 1 day of exercise at home 4.2/2 3 min, 5.2/2 all at 1% incline 30 sec - 1 min and then drop back to 4.2/0 and repeat  Add 1 day of exercise at home   Treadmill   MPH  4.2 5.2 5.2 5.2   Grade  _8 Minutes  _9 Bike   Level  1.1 1.1 1.1 1.1   Minutes  _0 Elliptical   Level  _1 Speed  _2 Minutes  _3 05/23/15 0700           Exercise Review   Progression No  Absent since last review; last visit 02/17/15       Response to Exercise   Duration Progress to 50 minutes of aerobic without signs/symptoms of physical distress       Intensity THRR New  115-157 (40-85% HRR)       Progression Continue progressive overload as per policy without signs/symptoms or physical distress.       Resistance Training   Training Prescription Yes       Weight 10       Reps 10-15       Interval Training   Interval Training Yes       Equipment Treadmill       Comments 4.2/2 3 min, 5.2/2 all at 1% incline 30 sec - 1 min and then drop back to 4.2/0 and repeat  Add 1 day of exercise at home       Treadmill   MPH 5.2       Grade 2       Minutes 20       Bike   Level 1.1       Minutes 15       Elliptical   Level 6       Speed 4       Minutes 15          Discharge Exercise Prescription (Final Exercise Prescription Changes):     Exercise Prescription Changes - 05/23/15 0700    Exercise Review   Progression No  Absent since last review; last visit 02/17/15   Response to Exercise   Duration Progress to 50 minutes of aerobic without signs/symptoms of  physical distress   Intensity THRR New  115-157 (40-85% HRR)   Progression Continue progressive overload as per policy without signs/symptoms or physical distress.   Resistance Training   Training Prescription Yes   Weight 10   Reps 10-15   Interval Training   Interval Training Yes   Equipment Treadmill   Comments 4.2/2 3 min, 5.2/2 all at 1% incline 30 sec - 1 min and then drop back to 4.2/0 and repeat  Add 1 day of exercise at home   Treadmill   MPH 5.2   Grade 2   Minutes 20   Bike   Level 1.1   Minutes 15   Elliptical   Level 6   Speed 4   Minutes 15      Nutrition:  Target Goals: Understanding of nutrition guidelines, daily intake of sodium <1562m, cholesterol <2046m calories 30% from fat and 7% or less from saturated fats, daily to have 5 or more servings of fruits and vegetables.  Biometrics:    Nutrition Therapy Plan and Nutrition Goals:     Nutrition Therapy & Goals - 02/03/15 1629    Nutrition Therapy   Diet 1800 calorie meal plan for diabetes; DASH diet principles   Drug/Food Interactions Statins/Certain Fruits   Fiber 20 grams   Whole Grain Foods 3 servings   Protein 9 ounces/day   Saturated Fats 12 max. grams   Fruits and Vegetables 5 servings/day   Personal Nutrition Goals   Personal Goal #1 Include  breakfast every morning that includes a protein food and at least 2 carbohydrate servings.   Personal Goal #2 Read labels for saturated fat, trans fat and sodium   Personal Goal #3 Read labels for carbohydrate grams. Keep meals in range of 45-60 gms and snacks in range of 15-30 gms.      Nutrition Discharge: Rate Your Plate Scores:     Rate Your Plate - 32/35/57 3220    Rate Your Plate Scores   Pre Score 77   Pre Score % 88 %      Nutrition Goals Re-Evaluation:     Nutrition Goals Re-Evaluation      01/06/15 1011 02/01/15 1117 02/07/15 1048 03/07/15 1309 05/25/15 1606   Personal Goal #1 Re-Evaluation   Personal Goal #1 Is eating healtier  Working on healthy eating habits Include breakfast every morning that includes a protein food and at least 2 carbohydrate servings. Include breakfast every morning that includes a protein food and at least 2 carbohydrate servings.    Goal Progress Seen Yes Yes Yes  Yes   Comments  Continues to work on healthy eating.  No sugar being used since left hospital Continues to work on healthy eating.  No sugar being used since left hospital Continues to work on healthy eating.  No sugar being used since left hospital I called Seth Riddle and asked him how he was doing and he said he is doing ok. 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 I mentioned to him the different class times and he believes that the 3:45pm class time might work better for him now.  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 Seth Riddle is trying to eat good while he is traveling for work.    Personal Goal #2 Re-Evaluation   Personal Goal #2   Read labels for saturated fat, trans fat and sodium Read labels for saturated fat, trans fat and sodium    Goal Progress Seen    Yes    Comments    Doing best to read labels    Personal Goal #3 Re-Evaluation   Personal Goal #3   Read labels for carbohydrate grams. Keep meals in range of 45-60 gms and snacks in range of 15-30 gms. Read labels for carbohydrate grams. Keep meals in range of 45-60 gms and snacks in range of 15-30 gms.    Goal Progress Seen    Yes    Comments    Doing best to read labels       Psychosocial: Target Goals: Acknowledge presence or absence of depression, maximize coping skills, provide positive support system. Participant is able to verbalize types and ability to use techniques and skills needed for reducing stress and  depression.  Initial Review & Psychosocial Screening:     Initial Psych Review & Screening - 12/22/14 [place]? Yes   Barriers   Psychosocial barriers to participate in program There are no identifiable barriers or psychosocial needs.   Screening Interventions   Interventions Encouraged to exercise      Quality of Life Scores:     Quality of Life - 02/07/15 1049  Quality of Life Scores   Health/Function Pre 22.25 %   Socioeconomic Pre 23.86 %   Psych/Spiritual Pre 23.5 %   Family Pre 27 %   GLOBAL Pre 23.58 %      PHQ-9:     Recent Review Flowsheet Data    Depression screen [facility] 2/9 12/22/2014   Decreased Interest 0   Down, Depressed, Hopeless 0   PHQ - 2 Score 0   Altered sleeping 2   Tired, decreased energy 0   Change in appetite 0   Feeling bad or failure about yourself  0   Trouble concentrating 0   Moving slowly or fidgety/restless 0   Suicidal thoughts 0   PHQ-9 Score 2      Psychosocial Evaluation and Intervention:     Psychosocial Evaluation - 12/22/14 1734    Psychosocial Evaluation & Interventions   Interventions Encouraged to exercise with the program and follow exercise prescription      Psychosocial Re-Evaluation:     Psychosocial Re-Evaluation      05/25/15 1608 05/31/15 1102         Psychosocial Re-Evaluation   Interventions Encouraged to attend Cardiac Rehabilitation for the exercise       Comments I called Seth Riddle and asked him how he was doing and he said he is doing ok. 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 I mentioned to him the different class times and he believes that the 3:45pm class time might work better for him now.  Seth Riddle said he will check his work calendar this week and call us and let us know when he  can return to Cardiac Rehab.  --  Still out hopes to return to Cardiac Rehab.          Vocational Rehabilitation: Provide vocational rehab assistance to qualifying candidates.   Vocational Rehab Evaluation & Intervention:     Vocational Rehab - 12/22/14 1728    Initial Vocational Rehab Evaluation & Intervention   Assessment shows need for Vocational Rehabilitation No      Education: Education Goals: Education classes will be provided on a weekly basis, covering required topics. Participant will state understanding/return demonstration of topics presented.  Learning Barriers/Preferences:     Learning Barriers/Preferences - 12/22/14 1728    Learning Barriers/Preferences   Learning Barriers None   Learning Preferences None      Education Topics: General Nutrition Guidelines/Fats and Fiber: -Group instruction provided by verbal, written material, models and posters to present the general guidelines for heart healthy nutrition. Gives an explanation and review of dietary fats and fiber.          Cardiac Rehab from 02/15/2015 in [facility] Card[facility] Rehab   Date  02/06/15   Educator  CR   Instruction Review Code  2- meets goals/outcomes      Controlling Sodium/Reading Food Labels: -Group verbal and written material supporting the discussion of sodium use in heart healthy nutrition. Review and explanation with models, verbal and written materials for utilization of the food label.   Exercise Physiology & Risk Factors: - Group verbal and written instruction with models to review the exercise physiology of the cardiovascular system and associated critical values. Details cardiovascular disease risk factors and the goals associated with each risk factor.   Aerobic Exercise &  Resistance Training: - Gives group verbal and written discussion on the health impact of inactivity. On the components of aerobic and resistive training programs and the benefits of this training and how to safely  progress through these programs.      Cardiac Rehab from 01/09/2015 in [facility] Cardiac Rehab   Date  01/09/15   Educator  Dorisann Frames   Instruction Review Code  2- meets goals/outcomes      Flexibility, Balance, General Exercise Guidelines: - Provides group verbal and written instruction on the benefits of flexibility and balance training programs. Provides general exercise guidelines with specific guidelines to those with heart or lung disease. Demonstration and skill practice provided.      Cardiac Rehab from 02/15/2015 in [facility] Cardiac Rehab   Date  01/11/15   Educator  RM   Instruction Review Code  2- meets goals/outcomes      Stress Management: - Provides group verbal and written instruction about the health risks of elevated stress, cause of high stress, and healthy ways to reduce stress.      Cardiac Rehab from 12/28/2014 in [facility] Cardiac Rehab   Date  12/28/14   Educator  Lucianne Lei, LCSW   Instruction Review Code  2- meets goals/outcomes      Depression: - Provides group verbal and written instruction on the correlation between heart/lung disease and depressed mood, treatment options, and the stigmas associated with seeking treatment.      Cardiac Rehab from 02/15/2015 in [facility] Cardiac Rehab   Date  02/15/15   Educator  [facility]   Instruction Review Code  2- meets goals/outcomes      Anatomy & Physiology of the Heart: - Group verbal and written instruction and models provide basic cardiac anatomy and physiology, with the coronary electrical and arterial systems. Review of: AMI, Angina, Valve disease, Heart Failure, Cardiac Arrhythmia, Pacemakers, and the ICD.      Cardiac Rehab from 02/15/2015 in [facility] Cardiac Rehab   Date  01/23/15   Educator  S. Bice   Instruction Review Code  2- meets goals/outcomes      Cardiac Procedures: - Group verbal and written instruction and models to describe the testing methods done to diagnose heart disease. Reviews the outcomes of the test  results. Describes the treatment choices: Medical Management, Angioplasty, or Coronary Bypass Surgery.   Cardiac Medications: - Group verbal and written instruction to review commonly prescribed medications for heart disease. Reviews the medication, class of the drug, and side effects. Includes the steps to properly store meds and maintain the prescription regimen.      Cardiac Rehab from 01/09/2015 in [facility] Cardiac Rehab   Date  01/02/15   Educator  SB   Instruction Review Code  2- meets goals/outcomes      Go Sex-Intimacy & Heart Disease, Get SMART - Goal Setting: - Group verbal and written instruction through game format to discuss heart disease and the return to sexual intimacy. Provides group verbal and written material to discuss and apply goal setting through the application of the S.M.A.R.T. Method.   Other Matters of the Heart: - Provides group verbal, written materials and models to describe Heart Failure, Angina, Valve Disease, and Diabetes in the realm of heart disease. Includes description of the disease process and treatment options available to the cardiac patient.      Cardiac Rehab from 02/15/2015 in [facility] Cardiac Rehab   Date  02/01/15   Educator  SB   Instruction Review Code  2- meets  goals/outcomes      Exercise & Equipment Safety: - Individual verbal instruction and demonstration of equipment use and safety with use of the equipment.      Cardiac Rehab from 12/22/2014 in [facility] Cardiac Rehab   Date  12/22/14   Educator  C. Ravinder Hofland   Instruction Review Code  1- partially meets, needs review/practice      Infection Prevention: - Provides verbal and written material to individual with discussion of infection control including proper hand washing and proper equipment cleaning during exercise session.      Cardiac Rehab from 12/22/2014 in [facility] Cardiac Rehab   Date  12/22/14   Educator  C.Islay Polanco, RN   Instruction Review Code  2- meets goals/outcomes      Falls  Prevention: - Provides verbal and written material to individual with discussion of falls prevention and safety.      Cardiac Rehab from 12/22/2014 in [facility] Cardiac Rehab   Date  12/22/14   Educator  C.Joah Patlan   Instruction Review Code  2- meets goals/outcomes      Diabetes: - Individual verbal and written instruction to review signs/symptoms of diabetes, desired ranges of glucose level fasting, after meals and with exercise. Advice that pre and post exercise glucose checks will be done for 3 sessions at entry of program.    Knowledge Questionnaire Score:   Personal Goals and Risk Factors at Admission:     Personal Goals and Risk Factors at Admission - 12/22/14 1732    Personal Goals and Risk Factors on Admission    Weight Management Yes   Intervention Learn and follow the exercise and diet guidelines while in the program. Utilize the nutrition and education classes to help gain knowledge of the diet and exercise expectations in the program   Increase Aerobic Exercise and Physical Activity Yes   Intervention While in program, learn and follow the exercise prescription taught. Start at a low level workload and increase workload after able to maintain previous level for 30 minutes. Increase time before increasing intensity.   Take Less Medication Yes   Intervention Learn your risk factors and begin the lifestyle modifications for risk factor control during your time in the program.   Understand more about Heart/Pulmonary Disease. Yes   Intervention While in program utilize professionals for any questions, and attend the education sessions. Great websites to use are www.americanheart.org or www.lung.org for reliable information.   Diabetes Yes   Goal Blood glucose control identified by blood glucose values, HgbA1C. Participant verbalizes understanding of the signs/symptoms of hyper/hypo glycemia, proper foot care and importance of medication and nutrition plan for blood glucose control.    Intervention Provide nutrition & aerobic exercise along with prescribed medications to achieve blood glucose in normal ranges: Fasting 65-99 mg/dL      Personal Goals and Risk Factors Review:      Goals and Risk Factor Review      01/06/15 1012 02/01/15 1119 02/07/15 1048 03/07/15 1310 05/25/15 1607   Weight Management   Goals Progress/Improvement seen  Yes Yes Yes    Comments  Weight is down since admission to program Weight is down since admission to program weight maintianing    Increase Aerobic Exercise and Physical Activity   Goals Progress/Improvement seen  Yes Yes Yes Yes No   Comments Doing well on Elliptical and Treadmill Doing well with progression and interval training Doing well with progression and interval training Continues to do well with exercise progression. Has been out a couple  weeks I called Seth Riddle and asked him how he was doing and he said he is doing ok. 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 I mentioned to him the different class times and he believes that the 3:45pm class time might work better for him now.  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    Take Less Medication   Goals Progress/Improvement seen  No No No No   Comments  Understands this is a long term goal,to continue to pursue goal by maintianing the guidelines for nutrition and exerxise learned in program. Understands this is a long term goal,to continue to pursue goal by maintianing the guidelines for nutrition and exerxise learned in program. Understands this is a long term goal,to continue to pursue goal by maintianing the guidelines for nutrition and exerxise learned in program.    Understand more about Heart/Pulmonary Disease   Goals  Progress/Improvement seen   Yes Yes Yes    Comments  Attending classes and asking questions as needed. Attending classes and asking questions as needed. Attending classes and asking questions as needed.    Diabetes   Goal   Blood glucose control identified by blood glucose values, HgbA1C. Participant verbalizes understanding of the signs/symptoms of hyper/hypo glycemia, proper foot care and importance of medication and nutrition plan for blood glucose control. Blood glucose control identified by blood glucose values, HgbA1C. Participant verbalizes understanding of the signs/symptoms of hyper/hypo glycemia, proper foot care and importance of medication and nutrition plan for blood glucose control.    Progress seen towards goals  Yes Yes Yes    Comments  Blood sugar control is improved since nutrition changes and exercise regimen. Blood sugar control is improved since nutrition changes and exercise regimen. Blood sugar control is improved since nutrition changes and exercise regimen. Has been out since February 17, 2015      Personal Goals Discharge (Final Personal Goals and Risk Factors Review):      Goals and Risk Factor Review - 05/25/15 1607    Increase Aerobic Exercise and Physical Activity   Goals Progress/Improvement seen  No   Comments I called Seth Riddle and asked him how he was doing and he said he is doing ok. 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 I mentioned to him the different class times and he believes that the 3:45pm class time might work better for him now.  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    Take Less Medication   Goals Progress/Improvement seen No   Diabetes   Comments Has been out since February 17, 2015        Comments: Still out hopes to return to Cardiac Rehab.

## 2015-06-20 ENCOUNTER — Encounter: Payer: Self-pay | Admitting: *Deleted

## 2015-06-23 ENCOUNTER — Telehealth: Payer: Self-pay | Admitting: *Deleted

## 2015-06-23 NOTE — Telephone Encounter (Signed)
Called to check on status to return to program. Still plans on returning to program.  Will try to be here Monday at 3:45

## 2015-06-26 ENCOUNTER — Encounter: Payer: Self-pay | Admitting: *Deleted

## 2015-06-26 DIAGNOSIS — Z951 Presence of aortocoronary bypass graft: Secondary | ICD-10-CM

## 2015-06-26 NOTE — Progress Notes (Signed)
Cardiac Individual Treatment Plan  Patient Details  Name: Seth Riddle MRN: [id number] Date of Birth: [birth year] Referring Provider:  Thayer Headings, MD  Initial Encounter Date:    Visit Diagnosis: S/P CABG (coronary artery bypass graft)  Patient's Home Medications on Admission:  Current outpatient prescriptions:  .  acetaminophen (TYLENOL) 500 MG tablet, Take 500 mg by mouth every 6 (six) hours as needed (pain). , Disp: , Rfl:  .  aspirin EC 81 MG tablet, Take 1 tablet (81 mg total) by mouth daily., Disp: 90 tablet, Rfl: 3 .  atorvastatin (LIPITOR) 80 MG tablet, Take 1 tablet (80 mg total) by mouth daily at 6 PM., Disp: 30 tablet, Rfl: 1 .  blood glucose meter kit and supplies KIT, Dispense based on patient and insurance preference. Use up to four times daily as directed. (FOR ICD-9 250.00, 250.01)., Disp: 1 each, Rfl: 0 .  gabapentin (NEURONTIN) 300 MG capsule, Take 1 capsule (300 mg total) by mouth 3 (three) times daily., Disp: 90 capsule, Rfl: 1 .  lisinopril (PRINIVIL,ZESTRIL) 10 MG tablet, Take 1 tablet (10 mg total) by mouth daily., Disp: 30 tablet, Rfl: 1 .  MELATONIN PO, Take 1 tablet by mouth daily as needed (sleep)., Disp: , Rfl:  .  metFORMIN (GLUCOPHAGE) 500 MG tablet, Take 500 mg by mouth 2 (two) times daily with a meal., Disp: , Rfl:  .  metoprolol tartrate (LOPRESSOR) 25 MG tablet, Take 1 tablet (25 mg total) by mouth 2 (two) times daily., Disp: 60 tablet, Rfl: 1 .  oxyCODONE (OXY IR/ROXICODONE) 5 MG immediate release tablet, Take 1 tablet (5 mg total) by mouth 4 (four) times daily as needed for severe pain., Disp: 30 tablet, Rfl: 0  Past Medical History: Past Medical History  Diagnosis Date  . Dyslipidemia (high LDL; low HDL)   . Family history of premature CAD   . Coronary artery disease   . Low HDL (under 40)   . Diabetes mellitus without complication   . MI (myocardial infarction)     Tobacco Use: History  Smoking status  . Never Smoker   Smokeless  tobacco  . Never Used    Labs: Recent Review Flowsheet Data    Labs for ITP Cardiac and Pulmonary Rehab Latest Ref Rng 11/16/2014 11/16/2014 11/16/2014 11/16/2014 04/27/2015   Cholestrol 0 - 200 mg/dL - - - - 93   LDLCALC 0 - 99 mg/dL - - - - 49   HDL >39.00 mg/dL - - - - 28.30(L)   Trlycerides 0.0 - 149.0 mg/dL - - - - 78.0   PHART 7.350 - 7.450 7.375 7.312(L) 7.379 - -   PCO2ART 35.0 - 45.0 mmHg 38.3 41.4 38.7 - -   HCO3 20.0 - 24.0 mEq/L 22.1 20.8 22.8 - -   TCO2 0 - 100 mmol/L _0 -   ACIDBASEDEF 0.0 - 2.0 mmol/L 2.0 5.0(H) 2.0 - -   O2SAT - 93.0 91.0 90.0 - -       Exercise Target Goals:    Exercise Program Goal: Individual exercise prescription set with THRR, safety & activity barriers. Participant demonstrates ability to understand and report RPE using BORG scale, to self-measure pulse accurately, and to acknowledge the importance of the exercise prescription.  Exercise Prescription Goal: Starting with aerobic activity 30 plus minutes a day, 3 days per week for initial exercise prescription. Provide home exercise prescription and guidelines that participant acknowledges understanding prior to discharge.  Activity Barriers & Risk Stratification:  6 Minute Walk:   Initial Exercise Prescription:   Exercise Prescription Changes:     Exercise Prescription Changes      12/30/14 0900 01/09/15 1300 01/10/15 0600 01/20/15 0900 01/25/15 0800   Exercise Review   Progression  Yes Yes Yes Yes   Response to Exercise   Blood Pressure (Admit)  102/72 mmHg 102/72 mmHg 102/72 mmHg    Blood Pressure (Exercise)  140/82 mmHg 140/82 mmHg 140/82 mmHg    Blood Pressure (Exit)  118/60 mmHg 118/60 mmHg 118/60 mmHg    Heart Rate (Admit)  57 bpm 57 bpm 57 bpm    Heart Rate (Exercise)  116 bpm 116 bpm 116 bpm    Heart Rate (Exit)  78 bpm 78 bpm 78 bpm    Rating of Perceived Exertion (Exercise)  _0 Resistance Training   Training Prescription   Yes Yes Yes   Weight   _1 Reps   10-12 10-12 10-12   Interval Training   Interval Training   Yes Yes Yes   Equipment   Treadmill Treadmill Treadmill   Comments   4.2/0 3 min, 4.5/0 30 sec - 1 min and then drop back to 4.2/0 and repeat 4.2/0 3 min, 4.5/0 all at 1% incline 30 sec - 1 min and then drop back to 4.2/0 and repeat  Add 1 day of exercise at home up to 5 mph @ 2%   Treadmill   MPH 4 4.2 4.2 4.2    Grade 0 0 0 0    Minutes  _2 Bike   Level  1.1 1.1 1.1    Minutes  _3 Elliptical   Level  _4 Speed  _5 Minutes  _6 01/27/15 0900 02/06/15 1700 02/21/15 0700 03/27/15 0800 05/02/15 0700   Exercise Review   Progression  Yes Yes No  Absent since last review No  Absent since last review; last visit 02/17/15   Response to Exercise   Blood Pressure (Admit)  130/70 mmHg 102/70 mmHg 102/70 mmHg 102/70 mmHg   Blood Pressure (Exercise)  144/80 mmHg 140/74 mmHg 140/74 mmHg 140/74 mmHg   Blood Pressure (Exit)  106/62 mmHg 118/72 mmHg 118/72 mmHg 118/72 mmHg   Heart Rate (Admit)  71 bpm 78 bpm 78 bpm 78 bpm   Heart Rate (Exercise)  101 bpm 116 bpm 116 bpm 116 bpm   Heart Rate (Exit)  89 bpm 73 bpm 73 bpm 73 bpm   Rating of Perceived Exertion (Exercise)  _7 Symptoms  No No No No   Frequency  Add 1 additional day to program exercise sessions. Add 2 additional days to program exercise sessions. Add 2 additional days to program exercise sessions. Add 2 additional days to program exercise sessions.   Duration  Progress to 50 minutes of aerobic without signs/symptoms of physical distress Progress to 50 minutes of aerobic without signs/symptoms of physical distress Progress to 50 minutes of aerobic without signs/symptoms of physical distress Progress to 50 minutes of aerobic without signs/symptoms of physical distress   Intensity  Rest + 30 THRR New  115-157 (40-85% HRR) THRR New  115-157 (40-85% HRR) THRR New  115-157 (40-85% HRR)   Progression  Continue progressive  overload as per policy without signs/symptoms or physical distress. Continue progressive overload as  per policy without signs/symptoms or physical distress. Continue progressive overload as per policy without signs/symptoms or physical distress. Continue progressive overload as per policy without signs/symptoms or physical distress.   Resistance Training   Training Prescription _0    Weight _1 Reps 10-12 10-15 10-15 10-15 10-15   Interval Training   Interval Training  Yes Yes Yes Yes   Equipment  Treadmill Treadmill Treadmill Treadmill   Comments  4.2/2 3 min, 5.2/2 all at 1% incline 30 sec - 1 min and then drop back to 4.2/0 and repeat  Add 1 day of exercise at home 4.2/2 3 min, 5.2/2 all at 1% incline 30 sec - 1 min and then drop back to 4.2/0 and repeat  Add 1 day of exercise at home 4.2/2 3 min, 5.2/2 all at 1% incline 30 sec - 1 min and then drop back to 4.2/0 and repeat  Add 1 day of exercise at home 4.2/2 3 min, 5.2/2 all at 1% incline 30 sec - 1 min and then drop back to 4.2/0 and repeat  Add 1 day of exercise at home   Treadmill   MPH  4.2 5.2 5.2 5.2   Grade  _2 Minutes  _3 Bike   Level  1.1 1.1 1.1 1.1   Minutes  _4 Elliptical   Level  _5 Speed  _6 Minutes  _7 05/23/15 0700 06/20/15 0700         Exercise Review   Progression No  Absent since last review; last visit 02/17/15 No  Absent since last review; last visit 02/17/15      Response to Exercise   Duration Progress to 50 minutes of aerobic without signs/symptoms of physical distress Progress to 50 minutes of aerobic without signs/symptoms of physical distress      Intensity THRR New  115-157 (40-85% HRR) THRR New  115-157 (40-85% HRR)      Progression Continue progressive overload as per policy without signs/symptoms or physical distress. Continue progressive overload as per policy without signs/symptoms or physical distress.       Resistance Training   Training Prescription Yes Yes      Weight 10 10      Reps 10-15 10-15      Interval Training   Interval Training Yes Yes      Equipment Treadmill Treadmill      Comments 4.2/2 3 min, 5.2/2 all at 1% incline 30 sec - 1 min and then drop back to 4.2/0 and repeat  Add 1 day of exercise at home 4.2/2 3 min, 5.2/2 all at 1% incline 30 sec - 1 min and then drop back to 4.2/0 and repeat  Add 1 day of exercise at home      Treadmill   MPH 5.2 5.2      Grade 2 2      Minutes 20 20      Bike   Level 1.1 1.1      Minutes 15 15      Elliptical   Level 6 6      Speed 4 4      Minutes 15 15         Discharge Exercise Prescription (Final Exercise Prescription Changes):     Exercise Prescription Changes -  06/20/15 0700    Exercise Review   Progression No  Absent since last review; last visit 02/17/15   Response to Exercise   Duration Progress to 50 minutes of aerobic without signs/symptoms of physical distress   Intensity THRR New  115-157 (40-85% HRR)   Progression Continue progressive overload as per policy without signs/symptoms or physical distress.   Resistance Training   Training Prescription Yes   Weight 10   Reps 10-15   Interval Training   Interval Training Yes   Equipment Treadmill   Comments 4.2/2 3 min, 5.2/2 all at 1% incline 30 sec - 1 min and then drop back to 4.2/0 and repeat  Add 1 day of exercise at home   Treadmill   MPH 5.2   Grade 2   Minutes 20   Bike   Level 1.1   Minutes 15   Elliptical   Level 6   Speed 4   Minutes 15      Nutrition:  Target Goals: Understanding of nutrition guidelines, daily intake of sodium <1535m, cholesterol <2039m calories 30% from fat and 7% or less from saturated fats, daily to have 5 or more servings of fruits and vegetables.  Biometrics:    Nutrition Therapy Plan and Nutrition Goals:     Nutrition Therapy & Goals - 02/03/15 1629    Nutrition Therapy   Diet 1800 calorie meal plan for  diabetes; DASH diet principles   Drug/Food Interactions Statins/Certain Fruits   Fiber 20 grams   Whole Grain Foods 3 servings   Protein 9 ounces/day   Saturated Fats 12 max. grams   Fruits and Vegetables 5 servings/day   Personal Nutrition Goals   Personal Goal #1 Include breakfast every morning that includes a protein food and at least 2 carbohydrate servings.   Personal Goal #2 Read labels for saturated fat, trans fat and sodium   Personal Goal #3 Read labels for carbohydrate grams. Keep meals in range of 45-60 gms and snacks in range of 15-30 gms.      Nutrition Discharge: Rate Your Plate Scores:     Rate Your Plate - 0647/65/4605035  Rate Your Plate Scores   Pre Score 77   Pre Score % 88 %      Nutrition Goals Re-Evaluation:     Nutrition Goals Re-Evaluation      01/06/15 1011 02/01/15 1117 02/07/15 1048 03/07/15 1309 05/25/15 1606   Personal Goal #1 Re-Evaluation   Personal Goal #1 Is eating healtier Working on healthy eating habits Include breakfast every morning that includes a protein food and at least 2 carbohydrate servings. Include breakfast every morning that includes a protein food and at least 2 carbohydrate servings.    Goal Progress Seen Yes Yes Yes  Yes   Comments  Continues to work on healthy eating.  No sugar being used since left hospital Continues to work on healthy eating.  No sugar being used since left hospital Continues to work on healthy eating.  No sugar being used since left hospital I called JeReather Conversend asked him how he was doing and he said he is doing ok. 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 I mentioned to him the different class times and he believes that the 3:45pm class time  might work better for him now.  [name] said he will check his work calendar this week and  call us and let us know when he can return to Cardiac Rehab. Duffy Rhody is trying to eat good while he is traveling for work.    Personal Goal #2 Re-Evaluation   Personal Goal #2   Read labels for saturated fat, trans fat and sodium Read labels for saturated fat, trans fat and sodium    Goal Progress Seen    Yes    Comments    Doing best to read labels    Personal Goal #3 Re-Evaluation   Personal Goal #3   Read labels for carbohydrate grams. Keep meals in range of 45-60 gms and snacks in range of 15-30 gms. Read labels for carbohydrate grams. Keep meals in range of 45-60 gms and snacks in range of 15-30 gms.    Goal Progress Seen    Yes    Comments    Doing best to read labels       Psychosocial: Target Goals: Acknowledge presence or absence of depression, maximize coping skills, provide positive support system. Participant is able to verbalize types and ability to use techniques and skills needed for reducing stress and depression.  Initial Review & Psychosocial Screening:   Quality of Life Scores:     Quality of Life - 02/07/15 1049    Quality of Life Scores   Health/Function Pre 22.25 %   Socioeconomic Pre 23.86 %   Psych/Spiritual Pre 23.5 %   Family Pre 27 %   GLOBAL Pre 23.58 %      PHQ-9:     Recent Review Flowsheet Data    Depression screen [facility] 2/9 12/22/2014   Decreased Interest 0   Down, Depressed, Hopeless 0   PHQ - 2 Score 0   Altered sleeping 2   Tired, decreased energy 0   Change in appetite 0   Feeling bad or failure about yourself  0   Trouble concentrating 0   Moving slowly or fidgety/restless 0   Suicidal thoughts 0   PHQ-9 Score 2      Psychosocial Evaluation and Intervention:   Psychosocial Re-Evaluation:     Psychosocial Re-Evaluation      05/25/15 1608 05/31/15 1102         Psychosocial Re-Evaluation   Interventions Encouraged to attend Cardiac Rehabilitation for the exercise       Comments I called Reather Converse and asked him how he was  doing and he said he is doing ok. 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 [name] said he has been traveling for work the past 3.5 weeks but hopes to come back to finish up his Cardiac Rehab. I mentioned to him the different class times and he believes that the 3:45pm class time might work better for him now.  [name] said he will check his work calendar this week and call us and let us know when he can return to Cardiac Rehab.  --  Still out hopes to return to Cardiac Rehab.          Vocational Rehabilitation: Provide vocational rehab assistance to qualifying candidates.   Vocational Rehab Evaluation & Intervention:   Education: Education Goals: Education classes will be provided on a weekly basis, covering required topics. Participant will state understanding/return demonstration of topics presented.  Learning Barriers/Preferences:   Education Topics: General Nutrition Guidelines/Fats and Fiber: -  Group instruction provided by verbal, written material, models and posters to present the general guidelines for heart healthy nutrition. Gives an explanation and review of dietary fats and fiber.          Cardiac Rehab from 02/15/2015 in [facility]   Date  02/06/15   Educator  CR   Instruction Review Code  2- meets goals/outcomes      Controlling Sodium/Reading Food Labels: -Group verbal and written material supporting the discussion of sodium use in heart healthy nutrition. Review and explanation with models, verbal and written materials for utilization of the food label.   Exercise Physiology & Risk Factors: - Group verbal and written instruction with models to review the exercise physiology of the cardiovascular system and associated critical values. Details cardiovascular disease risk factors and the goals associated with each risk factor.   Aerobic Exercise & Resistance Training: - Gives group verbal and written  discussion on the health impact of inactivity. On the components of aerobic and resistive training programs and the benefits of this training and how to safely progress through these programs.      Cardiac Rehab from 01/09/2015 in [facility] Cardiac Rehab   Date  01/09/15   Educator  Dorisann Frames   Instruction Review Code  2- meets goals/outcomes      Flexibility, Balance, General Exercise Guidelines: - Provides group verbal and written instruction on the benefits of flexibility and balance training programs. Provides general exercise guidelines with specific guidelines to those with heart or lung disease. Demonstration and skill practice provided.      Cardiac Rehab from 02/15/2015 in [facility] Cardiac Rehab   Date  01/11/15   Educator  RM   Instruction Review Code  2- meets goals/outcomes      Stress Management: - Provides group verbal and written instruction about the health risks of elevated stress, cause of high stress, and healthy ways to reduce stress.      Cardiac Rehab from 12/28/2014 in [facility] Cardiac Rehab   Date  12/28/14   Educator  Lucianne Lei, LCSW   Instruction Review Code  2- meets goals/outcomes      Depression: - Provides group verbal and written instruction on the correlation between heart/lung disease and depressed mood, treatment options, and the stigmas associated with seeking treatment.      Cardiac Rehab from 02/15/2015 in [facility]   Date  02/15/15   Educator  [facility]   Instruction Review Code  2- meets goals/outcomes      Anatomy & Physiology of the Heart: - Group verbal and written instruction and models provide basic cardiac anatomy and physiology, with the coronary electrical and arterial systems. Review of: AMI, Angina, Valve disease, Heart Failure, Cardiac Arrhythmia, Pacemakers, and the ICD.      Cardiac Rehab from 02/15/2015 in [facility] Cardiac Rehab   Date  01/23/15   Educator  S. Corleen Otwell   Instruction Review Code  2- meets goals/outcomes      Cardiac  Procedures: - Group verbal and written instruction and models to describe the testing methods done to diagnose heart disease. Reviews the outcomes of the test results. Describes the treatment choices: Medical Management, Angioplasty, or Coronary Bypass Surgery.   Cardiac Medications: - Group verbal and written instruction to review commonly prescribed medications for heart disease. Reviews the medication, class of the drug, and side effects. Includes the steps to properly store meds and maintain the prescription regimen.      Cardiac Rehab from 01/09/2015 in  [place] [facility]   Date  01/02/15   Educator  SB   Instruction Review Code  2- meets goals/outcomes      Go Sex-Intimacy & Heart Disease, Get SMART - Goal Setting: - Group verbal and written instruction through game format to discuss heart disease and the return to sexual intimacy. Provides group verbal and written material to discuss and apply goal setting through the application of the S.M.A.R.T. Method.   Other Matters of the Heart: - Provides group verbal, written materials and models to describe Heart Failure, Angina, Valve Disease, and Diabetes in the realm of heart disease. Includes description of the disease process and treatment options available to the cardiac patient.      Cardiac Rehab from 02/15/2015 in [facility] Cardiac Rehab   Date  02/01/15   Educator  SB   Instruction Review Code  2- meets goals/outcomes      Exercise & Equipment Safety: - Individual verbal instruction and demonstration of equipment use and safety with use of the equipment.      Cardiac Rehab from 12/22/2014 in [facility] Cardiac Rehab   Date  12/22/14   Educator  C. Enterkin   Instruction Review Code  1- partially meets, needs review/practice      Infection Prevention: - Provides verbal and written material to individual with discussion of infection control including proper hand washing and proper equipment cleaning during exercise session.      Cardiac  Rehab from 12/22/2014 in [facility] Cardiac Rehab   Date  12/22/14   Educator  C.Enterkin, RN   Instruction Review Code  2- meets goals/outcomes      Falls Prevention: - Provides verbal and written material to individual with discussion of falls prevention and safety.      Cardiac Rehab from 12/22/2014 in [facility] Cardiac Rehab   Date  12/22/14   Educator  C.Enterkin   Instruction Review Code  2- meets goals/outcomes      Diabetes: - Individual verbal and written instruction to review signs/symptoms of diabetes, desired ranges of glucose level fasting, after meals and with exercise. Advice that pre and post exercise glucose checks will be done for 3 sessions at entry of program.    Knowledge Questionnaire Score:   Personal Goals and Risk Factors at Admission:   Personal Goals and Risk Factors Review:      Goals and Risk Factor Review      01/06/15 1012 02/01/15 1119 02/07/15 1048 03/07/15 1310 05/25/15 1607   Weight Management   Goals Progress/Improvement seen  Yes Yes Yes    Comments  Weight is down since admission to program Weight is down since admission to program weight maintianing    Increase Aerobic Exercise and Physical Activity   Goals Progress/Improvement seen  Yes Yes Yes Yes No   Comments Doing well on Elliptical and Treadmill Doing well with progression and interval training Doing well with progression and interval training Continues to do well with exercise progression. Has been out a couple weeks I called Reather Converse and asked him how he was doing and he said he is doing ok. 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 [name] said he has been traveling for work the past 3.5 weeks but hopes to come back to finish up his Cardiac Rehab. I mentioned to him the different class times and he believes that the 3:45pm class time might work better for him now.  [name]  said he will check his work calendar this week and call us and  let us know when he can return to Cardiac Rehab.    Take Less Medication   Goals Progress/Improvement seen  No No No No   Comments  Understands this is a long term goal,to continue to pursue goal by maintianing the guidelines for nutrition and exerxise learned in program. Understands this is a long term goal,to continue to pursue goal by maintianing the guidelines for nutrition and exerxise learned in program. Understands this is a long term goal,to continue to pursue goal by maintianing the guidelines for nutrition and exerxise learned in program.    Understand more about Heart/Pulmonary Disease   Goals Progress/Improvement seen   Yes Yes Yes    Comments  Attending classes and asking questions as needed. Attending classes and asking questions as needed. Attending classes and asking questions as needed.    Diabetes   Goal   Blood glucose control identified by blood glucose values, HgbA1C. Participant verbalizes understanding of the signs/symptoms of hyper/hypo glycemia, proper foot care and importance of medication and nutrition plan for blood glucose control. Blood glucose control identified by blood glucose values, HgbA1C. Participant verbalizes understanding of the signs/symptoms of hyper/hypo glycemia, proper foot care and importance of medication and nutrition plan for blood glucose control.    Progress seen towards goals  Yes Yes Yes    Comments  Blood sugar control is improved since nutrition changes and exercise regimen. Blood sugar control is improved since nutrition changes and exercise regimen. Blood sugar control is improved since nutrition changes and exercise regimen. Has been out since February 17, 2015     06/26/15 1422           Increase Aerobic Exercise and Physical Activity   Goals Progress/Improvement seen  No       Comments Spoke with JEff last week and he plans to return to the program on Nov 7.           Personal Goals Discharge (Final Personal Goals and Risk Factors Review):       Goals and Risk Factor Review - 06/26/15 1422    Increase Aerobic Exercise and Physical Activity   Goals Progress/Improvement seen  No   Comments Spoke with JEff last week and he plans to return to the program on Nov 7.        Comments: 30 day review. Continue with ITP. [name] plans to return to the program this week.

## 2015-06-28 ENCOUNTER — Other Ambulatory Visit: Payer: Self-pay | Admitting: *Deleted

## 2015-06-28 DIAGNOSIS — Z951 Presence of aortocoronary bypass graft: Secondary | ICD-10-CM

## 2015-07-18 ENCOUNTER — Encounter: Payer: Self-pay | Admitting: *Deleted

## 2015-07-24 ENCOUNTER — Telehealth: Payer: Self-pay | Admitting: *Deleted

## 2015-07-24 NOTE — Progress Notes (Signed)
Cardiac Individual Treatment Plan  Patient Details  Name: Seth Riddle MRN: [id number] Date of Birth: 1960-08-22 Referring Provider:  No ref. provider found  Initial Encounter Date:    Visit Diagnosis: No diagnosis found.  Patient's Home Medications on Admission:  Current outpatient prescriptions:  .  acetaminophen (TYLENOL) 500 MG tablet, Take 500 mg by mouth every 6 (six) hours as needed (pain). , Disp: , Rfl:  .  aspirin EC 81 MG tablet, Take 1 tablet (81 mg total) by mouth daily., Disp: 90 tablet, Rfl: 3 .  atorvastatin (LIPITOR) 80 MG tablet, Take 1 tablet (80 mg total) by mouth daily at 6 PM., Disp: 30 tablet, Rfl: 1 .  blood glucose meter kit and supplies KIT, Dispense based on patient and insurance preference. Use up to four times daily as directed. (FOR ICD-9 250.00, 250.01)., Disp: 1 each, Rfl: 0 .  gabapentin (NEURONTIN) 300 MG capsule, Take 1 capsule (300 mg total) by mouth 3 (three) times daily., Disp: 90 capsule, Rfl: 1 .  lisinopril (PRINIVIL,ZESTRIL) 10 MG tablet, Take 1 tablet (10 mg total) by mouth daily., Disp: 30 tablet, Rfl: 1 .  MELATONIN PO, Take 1 tablet by mouth daily as needed (sleep)., Disp: , Rfl:  .  metFORMIN (GLUCOPHAGE) 500 MG tablet, Take 500 mg by mouth 2 (two) times daily with a meal., Disp: , Rfl:  .  metoprolol tartrate (LOPRESSOR) 25 MG tablet, Take 1 tablet (25 mg total) by mouth 2 (two) times daily., Disp: 60 tablet, Rfl: 1 .  oxyCODONE (OXY IR/ROXICODONE) 5 MG immediate release tablet, Take 1 tablet (5 mg total) by mouth 4 (four) times daily as needed for severe pain., Disp: 30 tablet, Rfl: 0  Past Medical History: Past Medical History  Diagnosis Date  . Dyslipidemia (high LDL; low HDL)   . Family history of premature CAD   . Coronary artery disease   . Low HDL (under 40)   . Diabetes mellitus without complication   . MI (myocardial infarction)     Tobacco Use: History  Smoking status  . Never Smoker   Smokeless tobacco  . Never  Used    Labs: Recent Review Flowsheet Data    Labs for ITP Cardiac and Pulmonary Rehab Latest Ref Rng 11/16/2014 11/16/2014 11/16/2014 11/16/2014 04/27/2015   Cholestrol 0 - 200 mg/dL - - - - 93   LDLCALC 0 - 99 mg/dL - - - - 49   HDL >39.00 mg/dL - - - - 28.30(L)   Trlycerides 0.0 - 149.0 mg/dL - - - - 78.0   PHART 7.350 - 7.450 7.375 7.312(L) 7.379 - -   PCO2ART 35.0 - 45.0 mmHg 38.3 41.4 38.7 - -   HCO3 20.0 - 24.0 mEq/L 22.1 20.8 22.8 - -   TCO2 0 - 100 mmol/L _0 -   ACIDBASEDEF 0.0 - 2.0 mmol/L 2.0 5.0(H) 2.0 - -   O2SAT - 93.0 91.0 90.0 - -       Exercise Target Goals:    Exercise Program Goal: Individual exercise prescription set with THRR, safety & activity barriers. Participant demonstrates ability to understand and report RPE using BORG scale, to self-measure pulse accurately, and to acknowledge the importance of the exercise prescription.  Exercise Prescription Goal: Starting with aerobic activity 30 plus minutes a day, 3 days per week for initial exercise prescription. Provide home exercise prescription and guidelines that participant acknowledges understanding prior to discharge.  Activity Barriers & Risk Stratification:   6 Minute Walk:  Initial Exercise Prescription:   Exercise Prescription Changes:     Exercise Prescription Changes      01/27/15 0900 02/06/15 1700 02/21/15 0700 03/27/15 0800 05/02/15 0700   Exercise Review   Progression  Yes Yes No  Absent since last review No  Absent since last review; last visit 02/17/15   Response to Exercise   Blood Pressure (Admit)  130/70 mmHg 102/70 mmHg 102/70 mmHg 102/70 mmHg   Blood Pressure (Exercise)  144/80 mmHg 140/74 mmHg 140/74 mmHg 140/74 mmHg   Blood Pressure (Exit)  106/62 mmHg 118/72 mmHg 118/72 mmHg 118/72 mmHg   Heart Rate (Admit)  71 bpm 78 bpm 78 bpm 78 bpm   Heart Rate (Exercise)  101 bpm 116 bpm 116 bpm 116 bpm   Heart Rate (Exit)  89 bpm 73 bpm 73 bpm 73 bpm   Rating of Perceived  Exertion (Exercise)  _0 Symptoms  No No No No   Frequency  Add 1 additional day to program exercise sessions. Add 2 additional days to program exercise sessions. Add 2 additional days to program exercise sessions. Add 2 additional days to program exercise sessions.   Duration  Progress to 50 minutes of aerobic without signs/symptoms of physical distress Progress to 50 minutes of aerobic without signs/symptoms of physical distress Progress to 50 minutes of aerobic without signs/symptoms of physical distress Progress to 50 minutes of aerobic without signs/symptoms of physical distress   Intensity  Rest + 30 THRR New  115-157 (40-85% HRR) THRR New  115-157 (40-85% HRR) THRR New  115-157 (40-85% HRR)   Progression  Continue progressive overload as per policy without signs/symptoms or physical distress. Continue progressive overload as per policy without signs/symptoms or physical distress. Continue progressive overload as per policy without signs/symptoms or physical distress. Continue progressive overload as per policy without signs/symptoms or physical distress.   Resistance Training   Training Prescription _1    Weight _2 Reps 10-12 10-15 10-15 10-15 10-15   Interval Training   Interval Training  Yes Yes Yes Yes   Equipment  Treadmill Treadmill Treadmill Treadmill   Comments  4.2/2 3 min, 5.2/2 all at 1% incline 30 sec - 1 min and then drop back to 4.2/0 and repeat  Add 1 day of exercise at home 4.2/2 3 min, 5.2/2 all at 1% incline 30 sec - 1 min and then drop back to 4.2/0 and repeat  Add 1 day of exercise at home 4.2/2 3 min, 5.2/2 all at 1% incline 30 sec - 1 min and then drop back to 4.2/0 and repeat  Add 1 day of exercise at home 4.2/2 3 min, 5.2/2 all at 1% incline 30 sec - 1 min and then drop back to 4.2/0 and repeat  Add 1 day of exercise at home   Treadmill   MPH  4.2 5.2 5.2 5.2   Grade  _3 Minutes  _4 Bike   Level  1.1 1.1  1.1 1.1   Minutes  _5 Elliptical   Level  _6 Speed  _7 Minutes  _8 05/23/15 0700 06/20/15 0700 07/18/15 0700       Exercise Review   Progression No  Absent since last review; last visit 02/17/15 No  Absent since  last review; last visit 02/17/15 No  Absent since last review; last visit 02/17/15     Response to Exercise   Duration Progress to 50 minutes of aerobic without signs/symptoms of physical distress Progress to 50 minutes of aerobic without signs/symptoms of physical distress Progress to 50 minutes of aerobic without signs/symptoms of physical distress     Intensity THRR New  115-157 (40-85% HRR) THRR New  115-157 (40-85% HRR) THRR New  115-157 (40-85% HRR)     Progression Continue progressive overload as per policy without signs/symptoms or physical distress. Continue progressive overload as per policy without signs/symptoms or physical distress. Continue progressive overload as per policy without signs/symptoms or physical distress.     Resistance Training   Training Prescription Yes Yes Yes     Weight _0 Reps 10-15 10-15 10-15     Interval Training   Interval Training Yes Yes Yes     Equipment Treadmill Treadmill Treadmill     Comments 4.2/2 3 min, 5.2/2 all at 1% incline 30 sec - 1 min and then drop back to 4.2/0 and repeat  Add 1 day of exercise at home 4.2/2 3 min, 5.2/2 all at 1% incline 30 sec - 1 min and then drop back to 4.2/0 and repeat  Add 1 day of exercise at home 4.2/2 3 min, 5.2/2 all at 1% incline 30 sec - 1 min and then drop back to 4.2/0 and repeat  Add 1 day of exercise at home     Treadmill   MPH 5.2 5.2 5.2     Grade _1 Minutes _2 Bike   Level 1.1 1.1 1.1     Minutes _3 Elliptical   Level _4 Speed _5 Minutes _6 Discharge Exercise Prescription (Final Exercise Prescription Changes):     Exercise Prescription Changes - 07/18/15 0700    Exercise  Review   Progression No  Absent since last review; last visit 02/17/15   Response to Exercise   Duration Progress to 50 minutes of aerobic without signs/symptoms of physical distress   Intensity THRR New  115-157 (40-85% HRR)   Progression Continue progressive overload as per policy without signs/symptoms or physical distress.   Resistance Training   Training Prescription Yes   Weight 10   Reps 10-15   Interval Training   Interval Training Yes   Equipment Treadmill   Comments 4.2/2 3 min, 5.2/2 all at 1% incline 30 sec - 1 min and then drop back to 4.2/0 and repeat  Add 1 day of exercise at home   Treadmill   MPH 5.2   Grade 2   Minutes 20   Bike   Level 1.1   Minutes 15   Elliptical   Level 6   Speed 4   Minutes 15      Nutrition:  Target Goals: Understanding of nutrition guidelines, daily intake of sodium <1518m, cholesterol <2072m calories 30% from fat and 7% or less from saturated fats, daily to have 5 or more servings of fruits and vegetables.  Biometrics:    Nutrition Therapy Plan and Nutrition Goals:     Nutrition Therapy & Goals - 02/03/15 1629    Nutrition Therapy   Diet 1800 calorie meal plan for diabetes; DASH diet  principles   Drug/Food Interactions Statins/Certain Fruits   Fiber 20 grams   Whole Grain Foods 3 servings   Protein 9 ounces/day   Saturated Fats 12 max. grams   Fruits and Vegetables 5 servings/day   Personal Nutrition Goals   Personal Goal #1 Include breakfast every morning that includes a protein food and at least 2 carbohydrate servings.   Personal Goal #2 Read labels for saturated fat, trans fat and sodium   Personal Goal #3 Read labels for carbohydrate grams. Keep meals in range of 45-60 gms and snacks in range of 15-30 gms.      Nutrition Discharge: Rate Your Plate Scores:     Rate Your Plate - 07/37/10 6269    Rate Your Plate Scores   Pre Score 77   Pre Score % 88 %      Nutrition Goals Re-Evaluation:     Nutrition  Goals Re-Evaluation      02/01/15 1117 02/07/15 1048 03/07/15 1309 05/25/15 1606     Personal Goal #1 Re-Evaluation   Personal Goal #1 Working on healthy eating habits Include breakfast every morning that includes a protein food and at least 2 carbohydrate servings. Include breakfast every morning that includes a protein food and at least 2 carbohydrate servings.     Goal Progress Seen Yes Yes  Yes    Comments Continues to work on healthy eating.  No sugar being used since left hospital Continues to work on healthy eating.  No sugar being used since left hospital Continues to work on healthy eating.  No sugar being used since left hospital I called Seth Riddle and asked him how he was doing and he said he is doing ok. 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 I mentioned to him the different class times and he believes that the 3:45pm class time might work better for him now.  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 Seth Riddle is trying to eat good while he is traveling for work.     Personal Goal #2 Re-Evaluation   Personal Goal #2  Read labels for saturated fat, trans fat and sodium Read labels for saturated fat, trans fat and sodium     Goal Progress Seen   Yes     Comments   Doing best to read labels     Personal Goal #3 Re-Evaluation   Personal Goal #3  Read labels for carbohydrate grams. Keep meals in range of 45-60 gms and snacks in range of 15-30 gms. Read labels for carbohydrate grams. Keep meals in range of 45-60 gms and snacks in range of 15-30 gms.     Goal Progress Seen   Yes     Comments   Doing best to read labels        Psychosocial: Target Goals: Acknowledge presence or absence of depression, maximize coping skills, provide positive  support system. Participant is able to verbalize types and ability to use techniques and skills needed for reducing stress and depression.  Initial Review & Psychosocial Screening:   Quality of Life Scores:     Quality of Life - 02/07/15 1049    Quality of Life Scores   Health/Function Pre  22.25 %   Socioeconomic Pre 23.86 %   Psych/Spiritual Pre 23.5 %   Family Pre 27 %   GLOBAL Pre 23.58 %      PHQ-9:     Recent Review Flowsheet Data    Depression screen [facility] 2/9 12/22/2014   Decreased Interest 0   Down, Depressed, Hopeless 0   PHQ - 2 Score 0   Altered sleeping 2   Tired, decreased energy 0   Change in appetite 0   Feeling bad or failure about yourself  0   Trouble concentrating 0   Moving slowly or fidgety/restless 0   Suicidal thoughts 0   PHQ-9 Score 2      Psychosocial Evaluation and Intervention:   Psychosocial Re-Evaluation:     Psychosocial Re-Evaluation      05/25/15 1608 05/31/15 1102         Psychosocial Re-Evaluation   Interventions Encouraged to attend Cardiac Rehabilitation for the exercise       Comments I called Seth Riddle and asked him how he was doing and he said he is doing ok. 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 I mentioned to him the different class times and he believes that the 3:45pm class time might work better for him now.  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  --  Still out hopes to return to Cardiac Rehab.          Vocational Rehabilitation: Provide vocational rehab assistance to qualifying candidates.   Vocational Rehab Evaluation & Intervention:   Education: Education Goals: Education classes will be provided on a weekly basis, covering required topics.  Participant will state understanding/return demonstration of topics presented.  Learning Barriers/Preferences:   Education Topics: General Nutrition Guidelines/Fats and Fiber: -Group instruction provided by verbal, written material, models and posters to present the general guidelines for heart healthy nutrition. Gives an explanation and review of dietary fats and fiber.          Cardiac Rehab from 02/15/2015 in [facility] Cardiac Rehab   Date  02/06/15   Educator  CR   Instruction Review Code  2- meets goals/outcomes      Controlling Sodium/Reading Food Labels: -Group verbal and written material supporting the discussion of sodium use in heart healthy nutrition. Review and explanation with models, verbal and written materials for utilization of the food label.   Exercise Physiology & Risk Factors: - Group verbal and written instruction with models to review the exercise physiology of the cardiovascular system and associated critical values. Details cardiovascular disease risk factors and the goals associated with each risk factor.   Aerobic Exercise & Resistance Training: - Gives group verbal and written discussion on the health impact of inactivity. On the components of aerobic and resistive training programs and the benefits of this training and how to safely progress through these programs.      Cardiac Rehab from 01/09/2015 in [facility] Cardiac Rehab   Date  01/09/15   Educator  Dorisann Frames   Instruction Review Code  2- meets goals/outcomes      Flexibility, Balance, General Exercise Guidelines: - Provides group verbal and written instruction on the benefits of flexibility and balance training  programs. Provides general exercise guidelines with specific guidelines to those with heart or lung disease. Demonstration and skill practice provided.      Cardiac Rehab from 02/15/2015 in [facility] Cardiac Rehab   Date  01/11/15   Educator  RM   Instruction Review Code  2- meets goals/outcomes       Stress Management: - Provides group verbal and written instruction about the health risks of elevated stress, cause of high stress, and healthy ways to reduce stress.      Cardiac Rehab from 12/28/2014 in [facility] Cardiac Rehab   Date  12/28/14   Educator  Lucianne Lei, LCSW   Instruction Review Code  2- meets goals/outcomes      Depression: - Provides group verbal and written instruction on the correlation between heart/lung disease and depressed mood, treatment options, and the stigmas associated with seeking treatment.      Cardiac Rehab from 02/15/2015 in [facility] Cardiac Rehab   Date  02/15/15   Educator  [facility]   Instruction Review Code  2- meets goals/outcomes      Anatomy & Physiology of the Heart: - Group verbal and written instruction and models provide basic cardiac anatomy and physiology, with the coronary electrical and arterial systems. Review of: AMI, Angina, Valve disease, Heart Failure, Cardiac Arrhythmia, Pacemakers, and the ICD.      Cardiac Rehab from 02/15/2015 in [facility] Cardiac Rehab   Date  01/23/15   Educator  S. Shahana Capes   Instruction Review Code  2- meets goals/outcomes      Cardiac Procedures: - Group verbal and written instruction and models to describe the testing methods done to diagnose heart disease. Reviews the outcomes of the test results. Describes the treatment choices: Medical Management, Angioplasty, or Coronary Bypass Surgery.   Cardiac Medications: - Group verbal and written instruction to review commonly prescribed medications for heart disease. Reviews the medication, class of the drug, and side effects. Includes the steps to properly store meds and maintain the prescription regimen.      Cardiac Rehab from 01/09/2015 in [facility] Cardiac Rehab   Date  01/02/15   Educator  SB   Instruction Review Code  2- meets goals/outcomes      Go Sex-Intimacy & Heart Disease, Get SMART - Goal Setting: - Group verbal and written instruction through game format to  discuss heart disease and the return to sexual intimacy. Provides group verbal and written material to discuss and apply goal setting through the application of the S.M.A.R.T. Method.   Other Matters of the Heart: - Provides group verbal, written materials and models to describe Heart Failure, Angina, Valve Disease, and Diabetes in the realm of heart disease. Includes description of the disease process and treatment options available to the cardiac patient.      Cardiac Rehab from 02/15/2015 in [facility] Cardiac Rehab   Date  02/01/15   Educator  SB   Instruction Review Code  2- meets goals/outcomes      Exercise & Equipment Safety: - Individual verbal instruction and demonstration of equipment use and safety with use of the equipment.      Cardiac Rehab from 12/22/2014 in [facility] Cardiac Rehab   Date  12/22/14   Educator  C. Enterkin   Instruction Review Code  1- partially meets, needs review/practice      Infection Prevention: - Provides verbal and written material to individual with discussion of infection control including proper hand washing and proper equipment cleaning during exercise session.  Cardiac Rehab from 12/22/2014 in [facility] Cardiac Rehab   Date  12/22/14   Educator  C.Enterkin, RN   Instruction Review Code  2- meets goals/outcomes      Falls Prevention: - Provides verbal and written material to individual with discussion of falls prevention and safety.      Cardiac Rehab from 12/22/2014 in [facility] Cardiac Rehab   Date  12/22/14   Educator  C.Enterkin   Instruction Review Code  2- meets goals/outcomes      Diabetes: - Individual verbal and written instruction to review signs/symptoms of diabetes, desired ranges of glucose level fasting, after meals and with exercise. Advice that pre and post exercise glucose checks will be done for 3 sessions at entry of program.    Knowledge Questionnaire Score:   Personal Goals and Risk Factors at Admission:   Personal Goals and  Risk Factors Review:      Goals and Risk Factor Review      02/01/15 1119 02/07/15 1048 03/07/15 1310 05/25/15 1607 06/26/15 1422   Weight Management   Goals Progress/Improvement seen Yes Yes Yes     Comments Weight is down since admission to program Weight is down since admission to program weight maintianing     Increase Aerobic Exercise and Physical Activity   Goals Progress/Improvement seen  Yes Yes Yes No No   Comments Doing well with progression and interval training Doing well with progression and interval training Continues to do well with exercise progression. Has been out a couple weeks I called Seth Riddle and asked him how he was doing and he said he is doing ok. 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 I mentioned to him the different class times and he believes that the 3:45pm class time might work better for him now.  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  Spoke with Seth Riddle last week and he plans to return to the program on Nov 7.    Take Less Medication   Goals Progress/Improvement seen No No No No    Comments Understands this is a long term goal,to continue to pursue goal by maintianing the guidelines for nutrition and exerxise learned in program. Understands this is a long term goal,to continue to pursue goal by maintianing the guidelines for nutrition and exerxise learned in program. Understands this is a long term goal,to continue to pursue goal by maintianing the guidelines for nutrition and exerxise learned in program.     Understand more about Heart/Pulmonary Disease   Goals Progress/Improvement seen  Yes Yes Yes     Comments Attending classes and asking questions as needed. Attending classes and asking questions as needed.  Attending classes and asking questions as needed.     Diabetes   Goal  Blood glucose control identified by blood glucose values, HgbA1C. Participant verbalizes understanding of the signs/symptoms of hyper/hypo glycemia, proper foot care and importance of medication and nutrition plan for blood glucose control. Blood glucose control identified by blood glucose values, HgbA1C. Participant verbalizes understanding of the signs/symptoms of hyper/hypo glycemia, proper foot care and importance of medication and nutrition plan for blood glucose control.     Progress seen towards goals Yes Yes  Yes     Comments Blood sugar control is improved since nutrition changes and exercise regimen. Blood sugar control is improved since nutrition changes and exercise regimen. Blood sugar control is improved since nutrition changes and exercise regimen. Has been out since February 17, 2015       Personal Goals Discharge (Final Personal Goals and Risk Factors Review):      Goals and Risk Factor Review - 06/26/15 1422    Increase Aerobic Exercise and Physical Activity   Goals Progress/Improvement seen  No   Comments Spoke with Seth Riddle last week and he plans to return to the program on Nov 7.       ITP Comments:     ITP Comments      07/24/15 1208           ITP Comments 30 day review preparation: Continue with ITP. Seth Riddle has been absent because of work. He wants to finish the program          Comments: absent this month traveling for work.

## 2015-07-24 NOTE — Telephone Encounter (Signed)
Called to check on status to return to program. Seth Riddle is presently traveling for work, wants to return to the program.

## 2015-07-26 NOTE — Addendum Note (Signed)
Addended by: Rudy Jew on: 07/26/2015 10:41 AM   Modules accepted: Orders

## 2015-08-15 ENCOUNTER — Encounter: Payer: Self-pay | Admitting: *Deleted

## 2015-08-15 DIAGNOSIS — Z951 Presence of aortocoronary bypass graft: Secondary | ICD-10-CM

## 2015-08-17 NOTE — Progress Notes (Signed)
Pulmonary Individual Treatment Plan  Patient Details  Name: Seth Riddle MRN: [id number] Date of Birth: [birth year] Referring Provider:  No ref. provider found  Initial Encounter Date:    Visit Diagnosis: No diagnosis found.  Patient's Home Medications on Admission:  Current outpatient prescriptions:  .  acetaminophen (TYLENOL) 500 MG tablet, Take 500 mg by mouth every 6 (six) hours as needed (pain). , Disp: , Rfl:  .  aspirin EC 81 MG tablet, Take 1 tablet (81 mg total) by mouth daily., Disp: 90 tablet, Rfl: 3 .  atorvastatin (LIPITOR) 80 MG tablet, Take 1 tablet (80 mg total) by mouth daily at 6 PM., Disp: 30 tablet, Rfl: 1 .  blood glucose meter kit and supplies KIT, Dispense based on patient and insurance preference. Use up to four times daily as directed. (FOR ICD-9 250.00, 250.01)., Disp: 1 each, Rfl: 0 .  gabapentin (NEURONTIN) 300 MG capsule, Take 1 capsule (300 mg total) by mouth 3 (three) times daily., Disp: 90 capsule, Rfl: 1 .  lisinopril (PRINIVIL,ZESTRIL) 10 MG tablet, Take 1 tablet (10 mg total) by mouth daily., Disp: 30 tablet, Rfl: 1 .  MELATONIN PO, Take 1 tablet by mouth daily as needed (sleep)., Disp: , Rfl:  .  metFORMIN (GLUCOPHAGE) 500 MG tablet, Take 500 mg by mouth 2 (two) times daily with a meal., Disp: , Rfl:  .  metoprolol tartrate (LOPRESSOR) 25 MG tablet, Take 1 tablet (25 mg total) by mouth 2 (two) times daily., Disp: 60 tablet, Rfl: 1 .  oxyCODONE (OXY IR/ROXICODONE) 5 MG immediate release tablet, Take 1 tablet (5 mg total) by mouth 4 (four) times daily as needed for severe pain., Disp: 30 tablet, Rfl: 0  Past Medical History: Past Medical History  Diagnosis Date  . Dyslipidemia (high LDL; low HDL)   . Family history of premature CAD   . Coronary artery disease   . Low HDL (under 40)   . Diabetes mellitus without complication   . MI (myocardial infarction)     Tobacco Use: History  Smoking status  . Never Smoker   Smokeless tobacco  . Never  Used    Labs: Recent Review Flowsheet Data    Labs for ITP Cardiac and Pulmonary Rehab Latest Ref Rng 11/16/2014 11/16/2014 11/16/2014 11/16/2014 04/27/2015   Cholestrol 0 - 200 mg/dL - - - - 93   LDLCALC 0 - 99 mg/dL - - - - 49   HDL >39.00 mg/dL - - - - 28.30(L)   Trlycerides 0.0 - 149.0 mg/dL - - - - 78.0   PHART 7.350 - 7.450 7.375 7.312(L) 7.379 - -   PCO2ART 35.0 - 45.0 mmHg 38.3 41.4 38.7 - -   HCO3 20.0 - 24.0 mEq/L 22.1 20.8 22.8 - -   TCO2 0 - 100 mmol/L '23 22 24 22 '$ -   ACIDBASEDEF 0.0 - 2.0 mmol/L 2.0 5.0(H) 2.0 - -   O2SAT - 93.0 91.0 90.0 - -       ADL UCSD:    Pulmonary Function Assessment:   Exercise Target Goals:    Exercise Program Goal: Individual exercise prescription set with THRR, safety & activity barriers. Participant demonstrates ability to understand and report RPE using BORG scale, to self-measure pulse accurately, and to acknowledge the importance of the exercise prescription.  Exercise Prescription Goal: Starting with aerobic activity 30 plus minutes a day, 3 days per week for initial exercise prescription. Provide home exercise prescription and guidelines that participant acknowledges understanding prior to discharge.  Activity Barriers & Risk Stratification:   6 Minute Walk:   Initial Exercise Prescription:   Exercise Prescription Changes:     Exercise Prescription Changes      02/21/15 0700 03/27/15 0800 05/02/15 0700 05/23/15 0700 06/20/15 0700   Exercise Review   Progression Yes No  Absent since last review No  Absent since last review; last visit 02/17/15 No  Absent since last review; last visit 02/17/15 No  Absent since last review; last visit 02/17/15   Response to Exercise   Blood Pressure (Admit) 102/70 mmHg 102/70 mmHg 102/70 mmHg     Blood Pressure (Exercise) 140/74 mmHg 140/74 mmHg 140/74 mmHg     Blood Pressure (Exit) 118/72 mmHg 118/72 mmHg 118/72 mmHg     Heart Rate (Admit) 78 bpm 78 bpm 78 bpm     Heart Rate (Exercise) 116  bpm 116 bpm 116 bpm     Heart Rate (Exit) 73 bpm 73 bpm 73 bpm     Rating of Perceived Exertion (Exercise) '15 15 15     '$ Symptoms No No No     Frequency Add 2 additional days to program exercise sessions. Add 2 additional days to program exercise sessions. Add 2 additional days to program exercise sessions.     Duration Progress to 50 minutes of aerobic without signs/symptoms of physical distress Progress to 50 minutes of aerobic without signs/symptoms of physical distress Progress to 50 minutes of aerobic without signs/symptoms of physical distress Progress to 50 minutes of aerobic without signs/symptoms of physical distress Progress to 50 minutes of aerobic without signs/symptoms of physical distress   Intensity THRR New  115-157 (40-85% HRR) THRR New  115-157 (40-85% HRR) THRR New  115-157 (40-85% HRR) THRR New  115-157 (40-85% HRR) THRR New  115-157 (40-85% HRR)   Progression Continue progressive overload as per policy without signs/symptoms or physical distress. Continue progressive overload as per policy without signs/symptoms or physical distress. Continue progressive overload as per policy without signs/symptoms or physical distress. Continue progressive overload as per policy without signs/symptoms or physical distress. Continue progressive overload as per policy without signs/symptoms or physical distress.   Resistance Training   Training Prescription Yes Yes Yes Yes Yes   Weight '10 10 10 10 10   '$ Reps 10-15 10-15 10-15 10-15 10-15   Interval Training   Interval Training Yes Yes Yes Yes Yes   Equipment Treadmill Treadmill Treadmill Treadmill Treadmill   Comments 4.2/2 3 min, 5.2/2 all at 1% incline 30 sec - 1 min and then drop back to 4.2/0 and repeat  Add 1 day of exercise at home 4.2/2 3 min, 5.2/2 all at 1% incline 30 sec - 1 min and then drop back to 4.2/0 and repeat  Add 1 day of exercise at home 4.2/2 3 min, 5.2/2 all at 1% incline 30 sec - 1 min and then drop back to 4.2/0 and  repeat  Add 1 day of exercise at home 4.2/2 3 min, 5.2/2 all at 1% incline 30 sec - 1 min and then drop back to 4.2/0 and repeat  Add 1 day of exercise at home 4.2/2 3 min, 5.2/2 all at 1% incline 30 sec - 1 min and then drop back to 4.2/0 and repeat  Add 1 day of exercise at home   Treadmill   MPH 5.2 5.2 5.2 5.2 5.2   Grade '2 2 2 2 2   '$ Minutes '20 20 20 20 20   '$ Bike   Level 1.1 1.1 1.1  1.1 1.1   Minutes '15 15 15 15 15   '$ Elliptical   Level '6 6 6 6 6   '$ Speed '4 4 4 4 4   '$ Minutes '15 15 15 15 15     '$ 07/18/15 0700 08/15/15 0800         Exercise Review   Progression No  Absent since last review; last visit 02/17/15 No  Absent since last review; last visit 02/17/15      Response to Exercise   Duration Progress to 50 minutes of aerobic without signs/symptoms of physical distress Progress to 50 minutes of aerobic without signs/symptoms of physical distress      Intensity THRR New  115-157 (40-85% HRR) THRR New  115-157 (40-85% HRR)      Progression Continue progressive overload as per policy without signs/symptoms or physical distress. Continue progressive overload as per policy without signs/symptoms or physical distress.      Resistance Training   Training Prescription Yes Yes      Weight 10 10      Reps 10-15 10-15      Interval Training   Interval Training Yes Yes      Equipment Treadmill Treadmill      Comments 4.2/2 3 min, 5.2/2 all at 1% incline 30 sec - 1 min and then drop back to 4.2/0 and repeat  Add 1 day of exercise at home 4.2/2 3 min, 5.2/2 all at 1% incline 30 sec - 1 min and then drop back to 4.2/0 and repeat  Add 1 day of exercise at home      Treadmill   MPH 5.2 5.2      Grade 2 2      Minutes 20 20      Bike   Level 1.1 1.1      Minutes 15 15      Elliptical   Level 6 6      Speed 4 4      Minutes 15 15         Discharge Exercise Prescription (Final Exercise Prescription Changes):     Exercise Prescription Changes - 08/15/15 0800    Exercise Review    Progression No  Absent since last review; last visit 02/17/15   Response to Exercise   Duration Progress to 50 minutes of aerobic without signs/symptoms of physical distress   Intensity THRR New  115-157 (40-85% HRR)   Progression Continue progressive overload as per policy without signs/symptoms or physical distress.   Resistance Training   Training Prescription Yes   Weight 10   Reps 10-15   Interval Training   Interval Training Yes   Equipment Treadmill   Comments 4.2/2 3 min, 5.2/2 all at 1% incline 30 sec - 1 min and then drop back to 4.2/0 and repeat  Add 1 day of exercise at home   Treadmill   MPH 5.2   Grade 2   Minutes 20   Bike   Level 1.1   Minutes 15   Elliptical   Level 6   Speed 4   Minutes 15       Nutrition:  Target Goals: Understanding of nutrition guidelines, daily intake of sodium '1500mg'$ , cholesterol '200mg'$ , calories 30% from fat and 7% or less from saturated fats, daily to have 5 or more servings of fruits and vegetables.  Biometrics:    Nutrition Therapy Plan and Nutrition Goals:   Nutrition Discharge: Rate Your Plate Scores:   Psychosocial: Target Goals: Acknowledge presence or  absence of depression, maximize coping skills, provide positive support system. Participant is able to verbalize types and ability to use techniques and skills needed for reducing stress and depression.  Initial Review & Psychosocial Screening:   Quality of Life Scores:   PHQ-9:     Recent Review Flowsheet Data    Depression screen [facility] 2/9 12/22/2014   Decreased Interest 0   Down, Depressed, Hopeless 0   PHQ - 2 Score 0   Altered sleeping 2   Tired, decreased energy 0   Change in appetite 0   Feeling bad or failure about yourself  0   Trouble concentrating 0   Moving slowly or fidgety/restless 0   Suicidal thoughts 0   PHQ-9 Score 2      Psychosocial Evaluation and Intervention:   Psychosocial Re-Evaluation:     Psychosocial Re-Evaluation       05/25/15 1608 05/31/15 1102 08/17/15 1733       Psychosocial Re-Evaluation   Interventions Encouraged to attend Cardiac Rehabilitation for the exercise       Comments I called Reather Converse and asked him how he was doing and he said he is doing ok. 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 I mentioned to him the different class times and he believes that the 3:45pm class time might work better for him now.  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  --  Still out hopes to return to Cardiac Rehab.  Seth Riddle has not been able to attend [facility] rehab since February 17, 2015       Education: Education Goals: Education classes will be provided on a weekly basis, covering required topics. Participant will state understanding/return demonstration of topics presented.  Learning Barriers/Preferences:   Education Topics: Initial Evaluation Education: - Verbal, written and demonstration of respiratory meds, RPE/PD scales, oximetry and breathing techniques. Instruction on use of nebulizers and MDIs: cleaning and proper use, rinsing mouth with steroid doses and importance of monitoring MDI activations.   General Nutrition Guidelines/Fats and Fiber: -Group instruction provided by verbal, written material, models and posters to present the general guidelines for heart healthy nutrition. Gives an explanation and review of dietary fats and fiber.          Cardiac Rehab from 02/15/2015 in [facility] Cardiac and Pulmonary Rehab   Date  02/06/15   Educator  CR   Instruction Review Code  2- meets goals/outcomes      Controlling Sodium/Reading Food Labels: -Group verbal and written material supporting the discussion of sodium use in heart healthy nutrition. Review and  explanation with models, verbal and written materials for utilization of the food label.   Exercise Physiology & Risk Factors: - Group verbal and written instruction with models to review the exercise physiology of the cardiovascular system and associated critical values. Details cardiovascular disease risk factors and the goals associated with each risk factor.   Aerobic Exercise & Resistance Training: - Gives group verbal and written discussion on the health impact of inactivity. On the components of aerobic and resistive training programs and the benefits of this training and how to safely progress through these programs.      Cardiac Rehab from 01/09/2015  in [facility] Cardiac and Pulmonary Rehab   Date  01/09/15   Educator  Dorisann Frames   Instruction Review Code  2- meets goals/outcomes      Flexibility, Balance, General Exercise Guidelines: - Provides group verbal and written instruction on the benefits of flexibility and balance training programs. Provides general exercise guidelines with specific guidelines to those with heart or lung disease. Demonstration and skill practice provided.      Cardiac Rehab from 02/15/2015 in [facility] Cardiac and Pulmonary Rehab   Date  01/11/15   Educator  RM   Instruction Review Code  2- meets goals/outcomes      Stress Management: - Provides group verbal and written instruction about the health risks of elevated stress, cause of high stress, and healthy ways to reduce stress.      Cardiac Rehab from 12/28/2014 in [facility] Cardiac and Pulmonary Rehab   Date  12/28/14   Educator  Lucianne Lei, LCSW   Instruction Review Code  2- meets goals/outcomes      Depression: - Provides group verbal and written instruction on the correlation between heart/lung disease and depressed mood, treatment options, and the stigmas associated with seeking treatment.      Cardiac Rehab from 02/15/2015 in [facility] Cardiac and Pulmonary Rehab   Date  02/15/15   Educator  [facility]    Instruction Review Code  2- meets goals/outcomes      Exercise & Equipment Safety: - Individual verbal instruction and demonstration of equipment use and safety with use of the equipment.      Cardiac Rehab from 12/22/2014 in [facility] Cardiac and Pulmonary Rehab   Date  12/22/14   Educator  C. Dawnell Bryant   Instruction Review Code  1- partially meets, needs review/practice      Infection Prevention: - Provides verbal and written material to individual with discussion of infection control including proper hand washing and proper equipment cleaning during exercise session.      Cardiac Rehab from 12/22/2014 in [facility] Cardiac and Pulmonary Rehab   Date  12/22/14   Educator  C.Odalys Win, RN   Instruction Review Code  2- meets goals/outcomes      Falls Prevention: - Provides verbal and written material to individual with discussion of falls prevention and safety.      Cardiac Rehab from 12/22/2014 in [facility] Cardiac and Pulmonary Rehab   Date  12/22/14   Educator  C.Agapita Savarino   Instruction Review Code  2- meets goals/outcomes      Diabetes: - Individual verbal and written instruction to review signs/symptoms of diabetes, desired ranges of glucose level fasting, after meals and with exercise. Advice that pre and post exercise glucose checks will be done for 3 sessions at entry of program.   Chronic Lung Diseases: - Group verbal and written instruction to review new updates, new respiratory medications, new advancements in procedures and treatments. Provide informative websites and "800" numbers of self-education.   Lung Procedures: - Group verbal and written instruction to describe testing methods done to diagnose lung disease. Review the outcome of test results. Describe the treatment choices: Pulmonary Function Tests, ABGs and oximetry.   Energy Conservation: - Provide group verbal and written instruction for methods to conserve energy, plan and organize activities. Instruct on pacing techniques,  use of adaptive equipment and posture/positioning to relieve shortness of breath.   Triggers: - Group verbal and written instruction to review types of environmental controls: home humidity, furnaces, filters, dust mite/pet prevention, HEPA vacuums. To discuss weather changes, air  quality and the benefits of nasal washing.   Exacerbations: - Group verbal and written instruction to provide: warning signs, infection symptoms, calling MD promptly, preventive modes, and value of vaccinations. Review: effective airway clearance, coughing and/or vibration techniques. Create an Sports administrator.   Oxygen: - Individual and group verbal and written instruction on oxygen therapy. Includes supplement oxygen, available portable oxygen systems, continuous and intermittent flow rates, oxygen safety, concentrators, and Medicare reimbursement for oxygen.   Respiratory Medications: - Group verbal and written instruction to review medications for lung disease. Drug class, frequency, complications, importance of spacers, rinsing mouth after steroid MDI's, and proper cleaning methods for nebulizers.   AED/CPR: - Group verbal and written instruction with the use of models to demonstrate the basic use of the AED with the basic ABC's of resuscitation.   Breathing Retraining: - Provides individuals verbal and written instruction on purpose, frequency, and proper technique of diaphragmatic breathing and pursed-lipped breathing. Applies individual practice skills.   Anatomy and Physiology of the Lungs: - Group verbal and written instruction with the use of models to provide basic lung anatomy and physiology related to function, structure and complications of lung disease.   Heart Failure: - Group verbal and written instruction on the basics of heart failure: signs/symptoms, treatments, explanation of ejection fraction, enlarged heart and cardiomyopathy.   Sleep Apnea: - Individual verbal and written instruction  to review Obstructive Sleep Apnea. Review of risk factors, methods for diagnosing and types of masks and machines for OSA.   Anxiety: - Provides group, verbal and written instruction on the correlation between heart/lung disease and anxiety, treatment options, and management of anxiety.   Relaxation: - Provides group, verbal and written instruction about the benefits of relaxation for patients with heart/lung disease. Also provides patients with examples of relaxation techniques.   Knowledge Questionnaire Score:   Personal Goals and Risk Factors at Admission:   Personal Goals and Risk Factors Review:      Goals and Risk Factor Review      03/07/15 1310 05/25/15 1607 06/26/15 1422 08/17/15 1732     Weight Management   Goals Progress/Improvement seen Yes   No    Comments weight maintianing   Seth Riddle has not been able to attend [facility] re[facility] since February 17, 2015    Increase Aerobic Exercise and Physical Activity   Goals Progress/Improvement seen  Yes No No No    Comments Continues to do well with exercise progression. Has been out a couple weeks I called Reather Converse and asked him how he was doing and he said he is doing ok. 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 I mentioned to him the different class times and he believes that the 3:45pm class time might work better for him now.  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  Spoke with JEff last week and he plans to return to the program on Nov 7.  Seth Riddle has not been able to attend [facility] since February 17, 2015    Take Less Medication   Goals Progress/Improvement seen No No  No    Comments Understands this is a long term goal,to continue to pursue goal by maintianing the  guidelines for  nutrition and exerxise learned in program.   Seth Riddle has not been able to attend [facility] since February 17, 2015    Understand more about Heart/Pulmonary Disease   Goals Progress/Improvement seen  Yes   No    Comments Attending classes and asking questions as needed.   Seth Riddle has not been able to attend [facility] since February 17, 2015    Diabetes   Goal Blood glucose control identified by blood glucose values, HgbA1C. Participant verbalizes understanding of the signs/symptoms of hyper/hypo glycemia, proper foot care and importance of medication and nutrition plan for blood glucose control.       Progress seen towards goals Yes   Unknown    Comments Blood sugar control is improved since nutrition changes and exercise regimen. Has been out since February 17, 2015  Seth Riddle has not been able to attend [facility] since February 17, 2015       Personal Goals Discharge (Final Personal Goals and Risk Factors Review):      Goals and Risk Factor Review - 08/17/15 1732    Weight Management   Goals Progress/Improvement seen No   Comments Seth Riddle has not been able to attend [facility] since February 17, 2015   Increase Aerobic Exercise and Physical Activity   Goals Progress/Improvement seen  No   Comments Seth Riddle has not been able to attend [facility] since February 17, 2015   Take Less Medication   Goals Progress/Improvement seen No   Comments Seth Riddle has not been able to attend [facility] since February 17, 2015   Understand more about Heart/Pulmonary Disease   Goals Progress/Improvement seen  No   Comments Seth Riddle has not been able to attend [facility] since February 17, 2015   Diabetes   Progress seen towards goals Unknown   Comments Seth Riddle has not been able to attend [facility] since February 17, 2015      ITP Comments:     ITP Comments      07/24/15 1208           ITP Comments 30 day review preparation: Continue with ITP. Seth Riddle has been absent because of work. He wants to finish the program          Comments: Seth Riddle  has not been able to attend [facility] since February 17, 2015

## 2015-08-17 NOTE — Addendum Note (Signed)
Addended by: Virgina Organ on: 08/17/2015 05:34 PM   Modules accepted: Orders

## 2015-08-23 NOTE — Progress Notes (Signed)
Cardiac Individual Treatment Plan  Patient Details  Name: Seth Riddle MRN: [id number] Date of Birth: [birth year] Referring Provider:  Thayer Headings, MD  Initial Encounter Date:    Visit Diagnosis: S/P CABG (coronary artery bypass graft)  Patient's Home Medications on Admission:  Current outpatient prescriptions:  .  acetaminophen (TYLENOL) 500 MG tablet, Take 500 mg by mouth every 6 (six) hours as needed (pain). , Disp: , Rfl:  .  aspirin EC 81 MG tablet, Take 1 tablet (81 mg total) by mouth daily., Disp: 90 tablet, Rfl: 3 .  atorvastatin (LIPITOR) 80 MG tablet, Take 1 tablet (80 mg total) by mouth daily at 6 PM., Disp: 30 tablet, Rfl: 1 .  blood glucose meter kit and supplies KIT, Dispense based on patient and insurance preference. Use up to four times daily as directed. (FOR ICD-9 250.00, 250.01)., Disp: 1 each, Rfl: 0 .  gabapentin (NEURONTIN) 300 MG capsule, Take 1 capsule (300 mg total) by mouth 3 (three) times daily., Disp: 90 capsule, Rfl: 1 .  lisinopril (PRINIVIL,ZESTRIL) 10 MG tablet, Take 1 tablet (10 mg total) by mouth daily., Disp: 30 tablet, Rfl: 1 .  MELATONIN PO, Take 1 tablet by mouth daily as needed (sleep)., Disp: , Rfl:  .  metFORMIN (GLUCOPHAGE) 500 MG tablet, Take 500 mg by mouth 2 (two) times daily with a meal., Disp: , Rfl:  .  metoprolol tartrate (LOPRESSOR) 25 MG tablet, Take 1 tablet (25 mg total) by mouth 2 (two) times daily., Disp: 60 tablet, Rfl: 1 .  oxyCODONE (OXY IR/ROXICODONE) 5 MG immediate release tablet, Take 1 tablet (5 mg total) by mouth 4 (four) times daily as needed for severe pain., Disp: 30 tablet, Rfl: 0  Past Medical History: Past Medical History  Diagnosis Date  . Dyslipidemia (high LDL; low HDL)   . Family history of premature CAD   . Coronary artery disease   . Low HDL (under 40)   . Diabetes mellitus without complication   . MI (myocardial infarction)     Tobacco Use: History  Smoking status  . Never Smoker   Smokeless  tobacco  . Never Used    Labs: Recent Review Flowsheet Data    Labs for ITP Cardiac and Pulmonary Rehab Latest Ref Rng 11/16/2014 11/16/2014 11/16/2014 11/16/2014 04/27/2015   Cholestrol 0 - 200 mg/dL - - - - 93   LDLCALC 0 - 99 mg/dL - - - - 49   HDL >39.00 mg/dL - - - - 28.30(L)   Trlycerides 0.0 - 149.0 mg/dL - - - - 78.0   PHART 7.350 - 7.450 7.375 7.312(L) 7.379 - -   PCO2ART 35.0 - 45.0 mmHg 38.3 41.4 38.7 - -   HCO3 20.0 - 24.0 mEq/L 22.1 20.8 22.8 - -   TCO2 0 - 100 mmol/L _0 -   ACIDBASEDEF 0.0 - 2.0 mmol/L 2.0 5.0(H) 2.0 - -   O2SAT - 93.0 91.0 90.0 - -       Exercise Target Goals:    Exercise Program Goal: Individual exercise prescription set with THRR, safety & activity barriers. Participant demonstrates ability to understand and report RPE using BORG scale, to self-measure pulse accurately, and to acknowledge the importance of the exercise prescription.  Exercise Prescription Goal: Starting with aerobic activity 30 plus minutes a day, 3 days per week for initial exercise prescription. Provide home exercise prescription and guidelines that participant acknowledges understanding prior to discharge.  Activity Barriers & Risk Stratification:  6 Minute Walk:   Initial Exercise Prescription:   Exercise Prescription Changes:     Exercise Prescription Changes      03/27/15 0800 05/02/15 0700 05/23/15 0700 06/20/15 0700 07/18/15 0700   Exercise Review   Progression No  Absent since last review No  Absent since last review; last visit 02/17/15 No  Absent since last review; last visit 02/17/15 No  Absent since last review; last visit 02/17/15 No  Absent since last review; last visit 02/17/15   Response to Exercise   Blood Pressure (Admit) 102/70 mmHg 102/70 mmHg      Blood Pressure (Exercise) 140/74 mmHg 140/74 mmHg      Blood Pressure (Exit) 118/72 mmHg 118/72 mmHg      Heart Rate (Admit) 78 bpm 78 bpm      Heart Rate (Exercise) 116 bpm 116 bpm      Heart Rate  (Exit) 73 bpm 73 bpm      Rating of Perceived Exertion (Exercise) 15 15      Symptoms No No      Frequency Add 2 additional days to program exercise sessions. Add 2 additional days to program exercise sessions.      Duration Progress to 50 minutes of aerobic without signs/symptoms of physical distress Progress to 50 minutes of aerobic without signs/symptoms of physical distress Progress to 50 minutes of aerobic without signs/symptoms of physical distress Progress to 50 minutes of aerobic without signs/symptoms of physical distress Progress to 50 minutes of aerobic without signs/symptoms of physical distress   Intensity THRR New  115-157 (40-85% HRR) THRR New  115-157 (40-85% HRR) THRR New  115-157 (40-85% HRR) THRR New  115-157 (40-85% HRR) THRR New  115-157 (40-85% HRR)   Progression Continue progressive overload as per policy without signs/symptoms or physical distress. Continue progressive overload as per policy without signs/symptoms or physical distress. Continue progressive overload as per policy without signs/symptoms or physical distress. Continue progressive overload as per policy without signs/symptoms or physical distress. Continue progressive overload as per policy without signs/symptoms or physical distress.   Resistance Training   Training Prescription _0    Weight _1 Reps 10-15 10-15 10-15 10-15 10-15   Interval Training   Interval Training _2    Equipment _3    Comments 4.2/2 3 min, 5.2/2 all at 1% incline 30 sec - 1 min and then drop back to 4.2/0 and repeat  Add 1 day of exercise at home 4.2/2 3 min, 5.2/2 all at 1% incline 30 sec - 1 min and then drop back to 4.2/0 and repeat  Add 1 day of exercise at home 4.2/2 3 min, 5.2/2 all at 1% incline 30 sec - 1 min and then drop back to 4.2/0 and repeat  Add 1 day of exercise at home 4.2/2 3 min, 5.2/2 all at 1% incline 30 sec - 1 min and then  drop back to 4.2/0 and repeat  Add 1 day of exercise at home 4.2/2 3 min, 5.2/2 all at 1% incline 30 sec - 1 min and then drop back to 4.2/0 and repeat  Add 1 day of exercise at home   Treadmill   MPH 5.2 5.2 5.2 5.2 5.2   Grade _4 Minutes _5 Bike   Level 1.1 1.1 1.1 1.1 1.1   Minutes _6 15  Elliptical   Level _0 Speed _1 Minutes _2 08/15/15 0800           Exercise Review   Progression No  Absent since last review; last visit 02/17/15       Response to Exercise   Duration Progress to 50 minutes of aerobic without signs/symptoms of physical distress       Intensity THRR New  115-157 (40-85% HRR)       Progression Continue progressive overload as per policy without signs/symptoms or physical distress.       Resistance Training   Training Prescription Yes       Weight 10       Reps 10-15       Interval Training   Interval Training Yes       Equipment Treadmill       Comments 4.2/2 3 min, 5.2/2 all at 1% incline 30 sec - 1 min and then drop back to 4.2/0 and repeat  Add 1 day of exercise at home       Treadmill   MPH 5.2       Grade 2       Minutes 20       Bike   Level 1.1       Minutes 15       Elliptical   Level 6       Speed 4       Minutes 15          Discharge Exercise Prescription (Final Exercise Prescription Changes):     Exercise Prescription Changes - 08/15/15 0800    Exercise Review   Progression No  Absent since last review; last visit 02/17/15   Response to Exercise   Duration Progress to 50 minutes of aerobic without signs/symptoms of physical distress   Intensity THRR New  115-157 (40-85% HRR)   Progression Continue progressive overload as per policy without signs/symptoms or physical distress.   Resistance Training   Training Prescription Yes   Weight 10   Reps 10-15   Interval Training   Interval Training Yes   Equipment Treadmill   Comments 4.2/2 3 min, 5.2/2 all at 1%  incline 30 sec - 1 min and then drop back to 4.2/0 and repeat  Add 1 day of exercise at home   Treadmill   MPH 5.2   Grade 2   Minutes 20   Bike   Level 1.1   Minutes 15   Elliptical   Level 6   Speed 4   Minutes 15      Nutrition:  Target Goals: Understanding of nutrition guidelines, daily intake of sodium <1572m, cholesterol <202m calories 30% from fat and 7% or less from saturated fats, daily to have 5 or more servings of fruits and vegetables.  Biometrics:    Nutrition Therapy Plan and Nutrition Goals:   Nutrition Discharge: Rate Your Plate Scores:   Nutrition Goals Re-Evaluation:     Nutrition Goals Re-Evaluation      03/07/15 1309 05/25/15 1606 08/17/15 1732       Personal Goal #1 Re-Evaluation   Personal Goal #1 Include breakfast every morning that includes a protein food and at least 2 carbohydrate servings.       Goal Progress Seen  Yes No     Comments Continues to work on healthy eating.  No sugar being used since  left hospital I called Seth Riddle and asked him how he was doing and he said he is doing ok. 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 I mentioned to him the different class times and he believes that the 3:45pm class time might work better for him now.  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 Seth Riddle is trying to eat good while he is traveling for work.  Seth Riddle has not been able to attend [facility] since February 17, 2015     Personal Goal #2 Re-Evaluation   Personal Goal #2 Read labels for saturated fat, trans fat and sodium       Goal Progress Seen Yes       Comments Doing best to read labels       Personal Goal #3 Re-Evaluation   Personal Goal #3 Read labels for carbohydrate grams. Keep meals in  range of 45-60 gms and snacks in range of 15-30 gms.       Goal Progress Seen Yes       Comments Doing best to read labels          Psychosocial: Target Goals: Acknowledge presence or absence of depression, maximize coping skills, provide positive support system. Participant is able to verbalize types and ability to use techniques and skills needed for reducing stress and depression.  Initial Review & Psychosocial Screening:   Quality of Life Scores:   PHQ-9:     Recent Review Flowsheet Data    Depression screen [facility] 2/9 12/22/2014   Decreased Interest 0   Down, Depressed, Hopeless 0   PHQ - 2 Score 0   Altered sleeping 2   Tired, decreased energy 0   Change in appetite 0   Feeling bad or failure about yourself  0   Trouble concentrating 0   Moving slowly or fidgety/restless 0   Suicidal thoughts 0   PHQ-9 Score 2      Psychosocial Evaluation and Intervention:   Psychosocial Re-Evaluation:     Psychosocial Re-Evaluation      05/25/15 1608 05/31/15 1102 08/17/15 1733       Psychosocial Re-Evaluation   Interventions Encouraged to attend Cardiac Rehabilitation for the exercise       Comments I called Seth Riddle and asked him how he was doing and he said he is doing ok. 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 I mentioned to him the different class times and he believes that the 3:45pm class time might work better for him now.  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  --  Still out hopes to return to Cardiac Rehab.  Seth Riddle has not been able to attend [facility] since February 17, 2015        Vocational Rehabilitation: Provide vocational rehab assistance to qualifying candidates.   Vocational Rehab Evaluation &  Intervention:   Education: Education Goals: Education  classes will be provided on a weekly basis, covering required topics. Participant will state understanding/return demonstration of topics presented.  Learning Barriers/Preferences:   Education Topics: General Nutrition Guidelines/Fats and Fiber: -Group instruction provided by verbal, written material, models and posters to present the general guidelines for heart healthy nutrition. Gives an explanation and review of dietary fats and fiber.          Cardiac Rehab from 02/15/2015 in [facility] Cardiac and Pulmonary Rehab   Date  02/06/15   Educator  CR   Instruction Review Code  2- meets goals/outcomes      Controlling Sodium/Reading Food Labels: -Group verbal and written material supporting the discussion of sodium use in heart healthy nutrition. Review and explanation with models, verbal and written materials for utilization of the food label.   Exercise Physiology & Risk Factors: - Group verbal and written instruction with models to review the exercise physiology of the cardiovascular system and associated critical values. Details cardiovascular disease risk factors and the goals associated with each risk factor.   Aerobic Exercise & Resistance Training: - Gives group verbal and written discussion on the health impact of inactivity. On the components of aerobic and resistive training programs and the benefits of this training and how to safely progress through these programs.      Cardiac Rehab from 01/09/2015 in [facility] Cardiac and Pulmonary Rehab   Date  01/09/15   Educator  Dorisann Frames   Instruction Review Code  2- meets goals/outcomes      Flexibility, Balance, General Exercise Guidelines: - Provides group verbal and written instruction on the benefits of flexibility and balance training programs. Provides general exercise guidelines with specific guidelines to those with heart or lung disease. Demonstration and skill practice  provided.      Cardiac Rehab from 02/15/2015 in [facility] Cardiac and Pulmonary Rehab   Date  01/11/15   Educator  RM   Instruction Review Code  2- meets goals/outcomes      Stress Management: - Provides group verbal and written instruction about the health risks of elevated stress, cause of high stress, and healthy ways to reduce stress.      Cardiac Rehab from 12/28/2014 in [facility] Cardiac and Pulmonary Rehab   Date  12/28/14   Educator  Lucianne Lei, LCSW   Instruction Review Code  2- meets goals/outcomes      Depression: - Provides group verbal and written instruction on the correlation between heart/lung disease and depressed mood, treatment options, and the stigmas associated with seeking treatment.      Cardiac Rehab from 02/15/2015 in [facility] Cardiac and Pulmonary Rehab   Date  02/15/15   Educator  [facility]   Instruction Review Code  2- meets goals/outcomes      Anatomy & Physiology of the Heart: - Group verbal and written instruction and models provide basic cardiac anatomy and physiology, with the coronary electrical and arterial systems. Review of: AMI, Angina, Valve disease, Heart Failure, Cardiac Arrhythmia, Pacemakers, and the ICD.      Cardiac Rehab from 02/15/2015 in [facility] Cardiac and Pulmonary Rehab   Date  01/23/15   Educator  S. Bice   Instruction Review Code  2- meets goals/outcomes      Cardiac Procedures: - Group verbal and written instruction and models to describe the testing methods done to diagnose heart disease. Reviews the outcomes of the test results. Describes the treatment choices: Medical Management, Angioplasty, or Coronary Bypass Surgery.   Cardiac Medications: - Group verbal and  written instruction to review commonly prescribed medications for heart disease. Reviews the medication, class of the drug, and side effects. Includes the steps to properly store meds and maintain the prescription regimen.      Cardiac Rehab from 01/09/2015 in [facility] Cardiac and Pulmonary  Rehab   Date  01/02/15   Educator  SB   Instruction Review Code  2- meets goals/outcomes      Go Sex-Intimacy & Heart Disease, Get SMART - Goal Setting: - Group verbal and written instruction through game format to discuss heart disease and the return to sexual intimacy. Provides group verbal and written material to discuss and apply goal setting through the application of the S.M.A.R.T. Method.   Other Matters of the Heart: - Provides group verbal, written materials and models to describe Heart Failure, Angina, Valve Disease, and Diabetes in the realm of heart disease. Includes description of the disease process and treatment options available to the cardiac patient.      Cardiac Rehab from 02/15/2015 in [facility] Cardiac and Pulmonary Rehab   Date  02/01/15   Educator  SB   Instruction Review Code  2- meets goals/outcomes      Exercise & Equipment Safety: - Individual verbal instruction and demonstration of equipment use and safety with use of the equipment.      Cardiac Rehab from 12/22/2014 in [facility] Cardiac and Pulmonary Rehab   Date  12/22/14   Educator  C. Enterkin   Instruction Review Code  1- partially meets, needs review/practice      Infection Prevention: - Provides verbal and written material to individual with discussion of infection control including proper hand washing and proper equipment cleaning during exercise session.      Cardiac Rehab from 12/22/2014 in [facility] Cardiac and Pulmonary Rehab   Date  12/22/14   Educator  C.Enterkin, RN   Instruction Review Code  2- meets goals/outcomes      Falls Prevention: - Provides verbal and written material to individual with discussion of falls prevention and safety.      Cardiac Rehab from 12/22/2014 in [facility] Cardiac and Pulmonary Rehab   Date  12/22/14   Educator  C.Enterkin   Instruction Review Code  2- meets goals/outcomes      Diabetes: - Individual verbal and written instruction to review signs/symptoms of diabetes,  desired ranges of glucose level fasting, after meals and with exercise. Advice that pre and post exercise glucose checks will be done for 3 sessions at entry of program.    Knowledge Questionnaire Score:   Personal Goals and Risk Factors at Admission:   Personal Goals and Risk Factors Review:      Goals and Risk Factor Review      03/07/15 1310 05/25/15 1607 06/26/15 1422 08/17/15 1732     Weight Management   Goals Progress/Improvement seen Yes   No    Comments weight maintianing   Seth Riddle has not been able to attend [facility] since February 17, 2015    Increase Aerobic Exercise and Physical Activity   Goals Progress/Improvement seen  Yes No No No    Comments Continues to do well with exercise progression. Has been out a couple weeks I called Seth Riddle and asked him how he was doing and he said he is doing ok. 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 Seth Riddle said he has been traveling for work the past 3.5 weeks but hopes  to come back to finish up his Cardiac Rehab. I mentioned to him the different class times and he believes that the 3:45pm class time might work better for him now.  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  Spoke with Seth Riddle last week and he plans to return to the program on Nov 7.  Seth Riddle has not been able to attend [facility] since February 17, 2015    Take Less Medication   Goals Progress/Improvement seen No No  No    Comments Understands this is a long term goal,to continue to pursue goal by maintianing the guidelines for nutrition and exerxise learned in program.   Seth Riddle has not been able to attend [facility] since February 17, 2015    Understand more about Heart/Pulmonary Disease   Goals Progress/Improvement seen  Yes   No    Comments Attending classes and asking questions as needed.   Seth Riddle has not been able to attend [facility] since  February 17, 2015    Diabetes   Goal Blood glucose control identified by blood glucose values, HgbA1C. Participant verbalizes understanding of the signs/symptoms of hyper/hypo glycemia, proper foot care and importance of medication and nutrition plan for blood glucose control.       Progress seen towards goals Yes   Unknown    Comments Blood sugar control is improved since nutrition changes and exercise regimen. Has been out since February 17, 2015  Seth Riddle has not been able to attend [facility] since February 17, 2015       Personal Goals Discharge (Final Personal Goals and Risk Factors Review):      Goals and Risk Factor Review - 08/17/15 1732    Weight Management   Goals Progress/Improvement seen No   Comments Seth Riddle has not been able to attend [facility] since February 17, 2015   Increase Aerobic Exercise and Physical Activity   Goals Progress/Improvement seen  No   Comments Seth Riddle has not been able to attend [facility] since February 17, 2015   Take Less Medication   Goals Progress/Improvement seen No   Comments Seth Riddle has not been able to attend [facility] since February 17, 2015   Understand more about Heart/Pulmonary Disease   Goals Progress/Improvement seen  No   Comments Seth Riddle has not been able to attend [facility] since February 17, 2015   Diabetes   Progress seen towards goals Unknown   Comments Seth Riddle has not been able to attend [facility] since February 17, 2015      ITP Comments:     ITP Comments      07/24/15 1208 08/23/15 1149         ITP Comments 30 day review preparation: Continue with ITP. Seth Riddle has been absent because of work. He wants to finish the program continue with ITP   Seth Riddle remains absent. Work schedule interferes with class time         Comments:

## 2015-08-23 NOTE — Addendum Note (Signed)
Addended by: Rudy Jew on: 08/23/2015 11:51 AM   Modules accepted: Orders

## 2015-09-14 NOTE — Addendum Note (Signed)
Addended by: Rudy Jew on: 09/14/2015 11:31 AM   Modules accepted: Orders

## 2015-09-14 NOTE — Progress Notes (Signed)
Cardiac Individual Treatment Plan  Patient Details  Name: Seth Riddle MRN: [id number] Date of Birth: 14-Dec-1960 Referring Provider:  Thayer Headings, MD  Initial Encounter Date:    Visit Diagnosis: S/P CABG (coronary artery bypass graft) - Plan: CARDIAC REHAB 30 DAY REVIEW  Patient's Home Medications on Admission:  Current outpatient prescriptions:  .  acetaminophen (TYLENOL) 500 MG tablet, Take 500 mg by mouth every 6 (six) hours as needed (pain). , Disp: , Rfl:  .  aspirin EC 81 MG tablet, Take 1 tablet (81 mg total) by mouth daily., Disp: 90 tablet, Rfl: 3 .  atorvastatin (LIPITOR) 80 MG tablet, Take 1 tablet (80 mg total) by mouth daily at 6 PM., Disp: 30 tablet, Rfl: 1 .  blood glucose meter kit and supplies KIT, Dispense based on patient and insurance preference. Use up to four times daily as directed. (FOR ICD-9 250.00, 250.01)., Disp: 1 each, Rfl: 0 .  gabapentin (NEURONTIN) 300 MG capsule, Take 1 capsule (300 mg total) by mouth 3 (three) times daily., Disp: 90 capsule, Rfl: 1 .  lisinopril (PRINIVIL,ZESTRIL) 10 MG tablet, Take 1 tablet (10 mg total) by mouth daily., Disp: 30 tablet, Rfl: 1 .  MELATONIN PO, Take 1 tablet by mouth daily as needed (sleep)., Disp: , Rfl:  .  metFORMIN (GLUCOPHAGE) 500 MG tablet, Take 500 mg by mouth 2 (two) times daily with a meal., Disp: , Rfl:  .  metoprolol tartrate (LOPRESSOR) 25 MG tablet, Take 1 tablet (25 mg total) by mouth 2 (two) times daily., Disp: 60 tablet, Rfl: 1 .  oxyCODONE (OXY IR/ROXICODONE) 5 MG immediate release tablet, Take 1 tablet (5 mg total) by mouth 4 (four) times daily as needed for severe pain., Disp: 30 tablet, Rfl: 0  Past Medical History: Past Medical History  Diagnosis Date  . Dyslipidemia (high LDL; low HDL)   . Family history of premature CAD   . Coronary artery disease   . Low HDL (under 40)   . Diabetes mellitus without complication   . MI (myocardial infarction)     Tobacco Use: History  Smoking  status  . Never Smoker   Smokeless tobacco  . Never Used    Labs: Recent Review Flowsheet Data    Labs for ITP Cardiac and Pulmonary Rehab Latest Ref Rng 11/16/2014 11/16/2014 11/16/2014 11/16/2014 04/27/2015   Cholestrol 0 - 200 mg/dL - - - - 93   LDLCALC 0 - 99 mg/dL - - - - 49   HDL >39.00 mg/dL - - - - 28.30(L)   Trlycerides 0.0 - 149.0 mg/dL - - - - 78.0   PHART 7.350 - 7.450 7.375 7.312(L) 7.379 - -   PCO2ART 35.0 - 45.0 mmHg 38.3 41.4 38.7 - -   HCO3 20.0 - 24.0 mEq/L 22.1 20.8 22.8 - -   TCO2 0 - 100 mmol/L _0 -   ACIDBASEDEF 0.0 - 2.0 mmol/L 2.0 5.0(H) 2.0 - -   O2SAT - 93.0 91.0 90.0 - -       Exercise Target Goals:    Exercise Program Goal: Individual exercise prescription set with THRR, safety & activity barriers. Participant demonstrates ability to understand and report RPE using BORG scale, to self-measure pulse accurately, and to acknowledge the importance of the exercise prescription.  Exercise Prescription Goal: Starting with aerobic activity 30 plus minutes a day, 3 days per week for initial exercise prescription. Provide home exercise prescription and guidelines that participant acknowledges understanding prior to discharge.  Activity Barriers & Risk Stratification:   6 Minute Walk:   Initial Exercise Prescription:   Exercise Prescription Changes:     Exercise Prescription Changes      03/27/15 0800 05/02/15 0700 05/23/15 0700 06/20/15 0700 07/18/15 0700   Exercise Review   Progression No  Absent since last review No  Absent since last review; last visit 02/17/15 No  Absent since last review; last visit 02/17/15 No  Absent since last review; last visit 02/17/15 No  Absent since last review; last visit 02/17/15   Response to Exercise   Blood Pressure (Admit) 102/70 mmHg 102/70 mmHg      Blood Pressure (Exercise) 140/74 mmHg 140/74 mmHg      Blood Pressure (Exit) 118/72 mmHg 118/72 mmHg      Heart Rate (Admit) 78 bpm 78 bpm      Heart Rate  (Exercise) 116 bpm 116 bpm      Heart Rate (Exit) 73 bpm 73 bpm      Rating of Perceived Exertion (Exercise) 15 15      Symptoms No No      Frequency Add 2 additional days to program exercise sessions. Add 2 additional days to program exercise sessions.      Duration Progress to 50 minutes of aerobic without signs/symptoms of physical distress Progress to 50 minutes of aerobic without signs/symptoms of physical distress Progress to 50 minutes of aerobic without signs/symptoms of physical distress Progress to 50 minutes of aerobic without signs/symptoms of physical distress Progress to 50 minutes of aerobic without signs/symptoms of physical distress   Intensity THRR New  115-157 (40-85% HRR) THRR New  115-157 (40-85% HRR) THRR New  115-157 (40-85% HRR) THRR New  115-157 (40-85% HRR) THRR New  115-157 (40-85% HRR)   Progression Continue progressive overload as per policy without signs/symptoms or physical distress. Continue progressive overload as per policy without signs/symptoms or physical distress. Continue progressive overload as per policy without signs/symptoms or physical distress. Continue progressive overload as per policy without signs/symptoms or physical distress. Continue progressive overload as per policy without signs/symptoms or physical distress.   Resistance Training   Training Prescription _0    Weight _1 Reps 10-15 10-15 10-15 10-15 10-15   Interval Training   Interval Training _2    Equipment _3    Comments 4.2/2 3 min, 5.2/2 all at 1% incline 30 sec - 1 min and then drop back to 4.2/0 and repeat  Add 1 day of exercise at home 4.2/2 3 min, 5.2/2 all at 1% incline 30 sec - 1 min and then drop back to 4.2/0 and repeat  Add 1 day of exercise at home 4.2/2 3 min, 5.2/2 all at 1% incline 30 sec - 1 min and then drop back to 4.2/0 and repeat  Add 1 day of exercise at home 4.2/2 3 min,  5.2/2 all at 1% incline 30 sec - 1 min and then drop back to 4.2/0 and repeat  Add 1 day of exercise at home 4.2/2 3 min, 5.2/2 all at 1% incline 30 sec - 1 min and then drop back to 4.2/0 and repeat  Add 1 day of exercise at home   Treadmill   MPH 5.2 5.2 5.2 5.2 5.2   Grade _4 Minutes _5 Bike   Level 1.1 1.1 1.1 1.1 1.1   Minutes  _0 Elliptical   Level _1 Speed _2 Minutes _3 08/15/15 0800           Exercise Review   Progression No  Absent since last review; last visit 02/17/15       Response to Exercise   Duration Progress to 50 minutes of aerobic without signs/symptoms of physical distress       Intensity THRR New  115-157 (40-85% HRR)       Progression Continue progressive overload as per policy without signs/symptoms or physical distress.       Resistance Training   Training Prescription Yes       Weight 10       Reps 10-15       Interval Training   Interval Training Yes       Equipment Treadmill       Comments 4.2/2 3 min, 5.2/2 all at 1% incline 30 sec - 1 min and then drop back to 4.2/0 and repeat  Add 1 day of exercise at home       Treadmill   MPH 5.2       Grade 2       Minutes 20       Bike   Level 1.1       Minutes 15       Elliptical   Level 6       Speed 4       Minutes 15          Discharge Exercise Prescription (Final Exercise Prescription Changes):     Exercise Prescription Changes - 08/15/15 0800    Exercise Review   Progression No  Absent since last review; last visit 02/17/15   Response to Exercise   Duration Progress to 50 minutes of aerobic without signs/symptoms of physical distress   Intensity THRR New  115-157 (40-85% HRR)   Progression Continue progressive overload as per policy without signs/symptoms or physical distress.   Resistance Training   Training Prescription Yes   Weight 10   Reps 10-15   Interval Training   Interval Training Yes   Equipment  Treadmill   Comments 4.2/2 3 min, 5.2/2 all at 1% incline 30 sec - 1 min and then drop back to 4.2/0 and repeat  Add 1 day of exercise at home   Treadmill   MPH 5.2   Grade 2   Minutes 20   Bike   Level 1.1   Minutes 15   Elliptical   Level 6   Speed 4   Minutes 15      Nutrition:  Target Goals: Understanding of nutrition guidelines, daily intake of sodium <1534m, cholesterol <20100m calories 30% from fat and 7% or less from saturated fats, daily to have 5 or more servings of fruits and vegetables.  Biometrics:    Nutrition Therapy Plan and Nutrition Goals:   Nutrition Discharge: Rate Your Plate Scores:   Nutrition Goals Re-Evaluation:     Nutrition Goals Re-Evaluation      05/25/15 1606 08/17/15 1732         Personal Goal #1 Re-Evaluation   Goal Progress Seen Yes No      Comments I called JeReather Conversend asked him how he was doing and he said he is doing ok. I asked him if he was planning on returning to Cardiac  Rehab since he has been out since February 17, 2015. 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 I mentioned to him the different class times and he believes that the 3:45pm class time might work better for him now.  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 Seth Riddle is trying to eat good while he is traveling for work.  Seth Riddle has not been able to attend [facility] rehab since February 17, 2015         Psychosocial: Target Goals: Acknowledge presence or absence of depression, maximize coping skills, provide positive support system. Participant is able to verbalize types and ability to use techniques and skills needed for reducing stress and depression.  Initial Review & Psychosocial Screening:   Quality of Life Scores:   PHQ-9:     Recent Review Flowsheet Data    Depression screen [facility] 2/9 12/22/2014   Decreased Interest 0    Down, Depressed, Hopeless 0   PHQ - 2 Score 0   Altered sleeping 2   Tired, decreased energy 0   Change in appetite 0   Feeling bad or failure about yourself  0   Trouble concentrating 0   Moving slowly or fidgety/restless 0   Suicidal thoughts 0   PHQ-9 Score 2      Psychosocial Evaluation and Intervention:   Psychosocial Re-Evaluation:     Psychosocial Re-Evaluation      05/25/15 1608 05/31/15 1102 08/17/15 1733       Psychosocial Re-Evaluation   Interventions Encouraged to attend Cardiac Rehabilitation for the exercise       Comments I called Seth Riddle and asked him how he was doing and he said he is doing ok. 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 I mentioned to him the different class times and he believes that the 3:45pm class time might work better for him now.  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  --  Still out hopes to return to Cardiac Rehab.  Seth Riddle has not been able to attend [facility] since February 17, 2015        Vocational Rehabilitation: Provide vocational rehab assistance to qualifying candidates.   Vocational Rehab Evaluation & Intervention:   Education: Education Goals: Education classes will be provided on a weekly basis, covering required topics. Participant will state understanding/return demonstration of topics presented.  Learning Barriers/Preferences:   Education Topics: General Nutrition Guidelines/Fats and Fiber: -Group instruction provided by verbal, written material, models and posters to present the general guidelines for heart healthy nutrition. Gives an explanation and review of dietary fats and fiber.          Cardiac Rehab from 02/15/2015 in [facility] Cardiac and Pulmonary Rehab    Date  02/06/15   Educator  CR   Instruction Review Code  2- meets goals/outcomes      Controlling Sodium/Reading Food Labels: -Group verbal and written material supporting the discussion of sodium use in heart healthy nutrition. Review and explanation with models, verbal and written materials for  utilization of the food label.   Exercise Physiology & Risk Factors: - Group verbal and written instruction with models to review the exercise physiology of the cardiovascular system and associated critical values. Details cardiovascular disease risk factors and the goals associated with each risk factor.   Aerobic Exercise & Resistance Training: - Gives group verbal and written discussion on the health impact of inactivity. On the components of aerobic and resistive training programs and the benefits of this training and how to safely progress through these programs.      Cardiac Rehab from 01/09/2015 in [facility] Cardiac and Pulmonary Rehab   Date  01/09/15   Educator  Dorisann Frames   Instruction Review Code  2- meets goals/outcomes      Flexibility, Balance, General Exercise Guidelines: - Provides group verbal and written instruction on the benefits of flexibility and balance training programs. Provides general exercise guidelines with specific guidelines to those with heart or lung disease. Demonstration and skill practice provided.      Cardiac Rehab from 02/15/2015 in [facility] Cardiac and Pulmonary Rehab   Date  01/11/15   Educator  RM   Instruction Review Code  2- meets goals/outcomes      Stress Management: - Provides group verbal and written instruction about the health risks of elevated stress, cause of high stress, and healthy ways to reduce stress.      Cardiac Rehab from 12/28/2014 in [facility] Cardiac and Pulmonary Rehab   Date  12/28/14   Educator  Lucianne Lei, LCSW   Instruction Review Code  2- meets goals/outcomes      Depression: - Provides group verbal and written instruction  on the correlation between heart/lung disease and depressed mood, treatment options, and the stigmas associated with seeking treatment.      Cardiac Rehab from 02/15/2015 in [facility] Cardiac and Pulmonary Rehab   Date  02/15/15   Educator  [facility]   Instruction Review Code  2- meets goals/outcomes      Anatomy & Physiology of the Heart: - Group verbal and written instruction and models provide basic cardiac anatomy and physiology, with the coronary electrical and arterial systems. Review of: AMI, Angina, Valve disease, Heart Failure, Cardiac Arrhythmia, Pacemakers, and the ICD.      Cardiac Rehab from 02/15/2015 in [facility] Cardiac and Pulmonary Rehab   Date  01/23/15   Educator  S. Bice   Instruction Review Code  2- meets goals/outcomes      Cardiac Procedures: - Group verbal and written instruction and models to describe the testing methods done to diagnose heart disease. Reviews the outcomes of the test results. Describes the treatment choices: Medical Management, Angioplasty, or Coronary Bypass Surgery.   Cardiac Medications: - Group verbal and written instruction to review commonly prescribed medications for heart disease. Reviews the medication, class of the drug, and side effects. Includes the steps to properly store meds and maintain the prescription regimen.      Cardiac Rehab from 01/09/2015 in [facility] Cardiac and Pulmonary Rehab   Date  01/02/15   Educator  SB   Instruction Review Code  2- meets goals/outcomes      Go Sex-Intimacy & Heart Disease, Get SMART - Goal Setting: - Group verbal and written instruction through game format to discuss heart disease and the return to sexual intimacy. Provides group verbal and written material to discuss and apply goal setting through the application of the S.M.A.R.T. Method.   Other Matters of the Heart: - Provides group verbal, written materials  and models to describe Heart Failure, Angina, Valve Disease, and Diabetes in the realm of heart disease.  Includes description of the disease process and treatment options available to the cardiac patient.      Cardiac Rehab from 02/15/2015 in [facility] Cardiac and Pulmonary Rehab   Date  02/01/15   Educator  SB   Instruction Review Code  2- meets goals/outcomes      Exercise & Equipment Safety: - Individual verbal instruction and demonstration of equipment use and safety with use of the equipment.      Cardiac Rehab from 12/22/2014 in [facility] Cardiac and Pulmonary Rehab   Date  12/22/14   Educator  C. Enterkin   Instruction Review Code  1- partially meets, needs review/practice      Infection Prevention: - Provides verbal and written material to individual with discussion of infection control including proper hand washing and proper equipment cleaning during exercise session.      Cardiac Rehab from 12/22/2014 in [facility] Cardiac and Pulmonary Rehab   Date  12/22/14   Educator  C.Enterkin, RN   Instruction Review Code  2- meets goals/outcomes      Falls Prevention: - Provides verbal and written material to individual with discussion of falls prevention and safety.      Cardiac Rehab from 12/22/2014 in [facility] Cardiac and Pulmonary Rehab   Date  12/22/14   Educator  C.Enterkin   Instruction Review Code  2- meets goals/outcomes      Diabetes: - Individual verbal and written instruction to review signs/symptoms of diabetes, desired ranges of glucose level fasting, after meals and with exercise. Advice that pre and post exercise glucose checks will be done for 3 sessions at entry of program.    Knowledge Questionnaire Score:   Personal Goals and Risk Factors at Admission:   Personal Goals and Risk Factors Review:      Goals and Risk Factor Review      05/25/15 1607 06/26/15 1422 08/17/15 1732       Weight Management   Goals Progress/Improvement seen   No     Comments   Seth Riddle has not been able to attend [facility] since February 17, 2015     Increase Aerobic Exercise and Physical Activity    Goals Progress/Improvement seen  No No No     Comments I called Seth Riddle and asked him how he was doing and he said he is doing ok. 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 I mentioned to him the different class times and he believes that the 3:45pm class time might work better for him now.  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  Spoke with Seth Riddle last week and he plans to return to the program on Nov 7.  Seth Riddle has not been able to attend [facility] since February 17, 2015     Take Less Medication   Goals Progress/Improvement seen No  No     Comments   Seth Riddle has not been able to attend [facility] since February 17, 2015     Understand more about Heart/Pulmonary Disease   Goals Progress/Improvement seen    No     Comments  Seth Riddle has not been able to attend [facility] since February 17, 2015     Diabetes   Progress seen towards goals   Unknown     Comments Has been out since February 17, 2015  Seth Riddle has not been able to attend [facility] since February 17, 2015        Personal Goals Discharge (Final Personal Goals and Risk Factors Review):      Goals and Risk Factor Review - 08/17/15 1732    Weight Management   Goals Progress/Improvement seen No   Comments Seth Riddle has not been able to attend [facility] since February 17, 2015   Increase Aerobic Exercise and Physical Activity   Goals Progress/Improvement seen  No   Comments Seth Riddle has not been able to attend [facility] since February 17, 2015   Take Less Medication   Goals Progress/Improvement seen No   Comments Seth Riddle has not been able to attend [facility] since February 17, 2015   Understand more about Heart/Pulmonary Disease   Goals Progress/Improvement seen  No   Comments Seth Riddle has not been able to attend  [facility] since February 17, 2015   Diabetes   Progress seen towards goals Unknown   Comments Seth Riddle has not been able to attend [facility] since February 17, 2015      ITP Comments:     ITP Comments      07/24/15 1208 08/23/15 1149 09/14/15 1128       ITP Comments 30 day review preparation: Continue with ITP. Seth Riddle has been absent because of work. He wants to finish the program continue with ITP   Seth Riddle remains absent. Work schedule interferes with class time Ready for 30 day review. Marland Kitchen  discharged        Comments:

## 2015-10-02 ENCOUNTER — Encounter: Payer: Self-pay | Admitting: *Deleted

## 2015-10-25 ENCOUNTER — Encounter: Payer: Self-pay | Admitting: Cardiovascular Disease

## 2015-10-25 ENCOUNTER — Ambulatory Visit (INDEPENDENT_AMBULATORY_CARE_PROVIDER_SITE_OTHER): Payer: BC Managed Care – PPO | Admitting: Cardiovascular Disease

## 2015-10-25 ENCOUNTER — Other Ambulatory Visit (INDEPENDENT_AMBULATORY_CARE_PROVIDER_SITE_OTHER): Payer: BC Managed Care – PPO | Admitting: *Deleted

## 2015-10-25 VITALS — BP 102/74 | HR 90 | Ht 71.0 in | Wt 266.8 lb

## 2015-10-25 DIAGNOSIS — Z951 Presence of aortocoronary bypass graft: Secondary | ICD-10-CM | POA: Diagnosis not present

## 2015-10-25 DIAGNOSIS — I2510 Atherosclerotic heart disease of native coronary artery without angina pectoris: Secondary | ICD-10-CM

## 2015-10-25 DIAGNOSIS — I5022 Chronic systolic (congestive) heart failure: Secondary | ICD-10-CM

## 2015-10-25 DIAGNOSIS — I2102 ST elevation (STEMI) myocardial infarction involving left anterior descending coronary artery: Secondary | ICD-10-CM

## 2015-10-25 DIAGNOSIS — E784 Other hyperlipidemia: Secondary | ICD-10-CM

## 2015-10-25 DIAGNOSIS — E785 Hyperlipidemia, unspecified: Secondary | ICD-10-CM

## 2015-10-25 LAB — COMPREHENSIVE METABOLIC PANEL
ALT: 35 U/L (ref 9–46)
AST: 25 U/L (ref 10–35)
Albumin: 4.2 g/dL (ref 3.6–5.1)
Alkaline Phosphatase: 80 U/L (ref 40–115)
BUN: 16 mg/dL (ref 7–25)
CALCIUM: 9.4 mg/dL (ref 8.6–10.3)
CHLORIDE: 102 mmol/L (ref 98–110)
CO2: 26 mmol/L (ref 20–31)
Creat: 1.17 mg/dL (ref 0.70–1.33)
GLUCOSE: 102 mg/dL — AB (ref 65–99)
POTASSIUM: 4.3 mmol/L (ref 3.5–5.3)
Sodium: 136 mmol/L (ref 135–146)
Total Bilirubin: 1.1 mg/dL (ref 0.2–1.2)
Total Protein: 6.8 g/dL (ref 6.1–8.1)

## 2015-10-25 LAB — LIPID PANEL
CHOL/HDL RATIO: 7.9 ratio — AB (ref <=5.0)
Cholesterol: 189 mg/dL (ref 125–200)
HDL: 24 mg/dL — AB (ref >=40)
LDL CALC: 116 mg/dL (ref <130)
TRIGLYCERIDES: 247 mg/dL — AB (ref <150)
VLDL: 49 mg/dL — ABNORMAL HIGH (ref <30)

## 2015-10-25 NOTE — Progress Notes (Signed)
Cardiology Office Note   Date:  10/25/2015   ID:  Seth Riddle, DOB [birth year], MRN [id number]  PCP:  Seth Box, MD  Cardiologist:   Seth Mixer, MD   Chief Complaint  Patient presents with  . Follow-up    no sx      History of Present Illness: Seth Riddle is a 55 y.o. male who presents for follow-up for his recent hospitalization for coronary artery disease, myocardial infarction and subsequent coronary artery bypass grafting. His admitted with symptoms of unstable angina. Cardiac catheterization the following day revealed significant three-vessel coronary artery disease.  The left main coronary artery was angiographically normal and bifurcated into the LAD and left circumflex coronary artery.  he LAD was a large caliber vessel and immmediately gave rise to a very high diagonal (ramus intermediate like vessel). There was diffuse 95% stenosis in the LAD in the region of this diagonal takeoff with 90-95% stenosis diffusely just beyond the origin of this ramus like vessel and 90% diffuse stenosis at the origin of the ramus like high diagonal vessel almost immediately after the left main. The left circumflex coronary artery was a moderate size vessel that gave rise to one major marginal branch. There was 95% focal stenosis just beyond an ectatic segment in the proximal obtuse marginal branch and there was diffuse distal 90% marginal stenosis. The RCA was angiographically normal , dominant vessel that gave rise to a large PDA and PLA vessel.  Left ventriculography revealed normal global LV contractility without focal segmental wall motion abnormalities. There was no evidence for mitral regurgitation. Ejection fraction is 55-60%.    Unfortunately, the night after his cardiac catheterization he occluded his left anterior descending artery and had ongoing chest pain. He returned to the Cath Lab the following morning an emergency balloon angioplasty  was performed to  open his LAD.    He was taken to emergent coronary artery bypass grafting at that time. Had a relatively slow recovery but has done fairly well. Echo done prior to DC revealed mild LV dysfunction:  Left ventricle: The cavity size was normal. Wall thickness was increased in a pattern of mild LVH. Systolic function was mildly reduced. The estimated ejection fraction was in the range of 45% to 50%. Diffuse hypokinesis. Doppler parameters are consistent with abnormal left ventricular relaxation (grade 1 diastolic dysfunction). - Right ventricle: The cavity size was mildly dilated. - Right atrium: The atrium was mildly dilated.  Impressions:  - Mild global reduction in LV function (EF 50); grade 1 diastolic dysfunction; mild RAE/RVE.    CABG  Left internal mammary artery to left anterior descending Saphenous vein graft to first diagonal Saphenous vein graft to first obtuse marginal   He has had significant right leg pain since the initial cath. He's had back surgery and the pain feels like a slipped disc.  Jan 05, 2015: Seth Riddle is doing well from a cardiac standpoint. He continues to have severe leg pain. He apparently had some injury that occurred following the initial cardiac catheterization prior to his bypass surgery. His echo from this week shows improved LV systolic function.  - Left ventricle: The cavity size was normal. Wall thickness was increased in a pattern of mild LVH. Systolic function was mildly to moderately reduced. The estimated ejection fraction was in the range of 40% to 45%. Akinesis of the apical myocardium. Moderate hypokinesis of the apicalanterior myocardium.  Sept. 8, 2016:  doing ok. Still has low stamina  Walking  some.  Had been running on the treadmill at cardiac rehab.   October 25, 2015: Doing just ok still walking .   Prior to his MI, he could work all day, spend time with family and still work 3 hours later in the  night.  His stamina has gone.    Is aware of his hear beat.    Feels mildly exhausted all day , every day .     No CP or angina   Past Medical History  Diagnosis Date  . Dyslipidemia (high LDL; low HDL)   . Family history of premature CAD   . Coronary artery disease   . Low HDL (under 40)   . Diabetes mellitus without complication (HCC)   . MI (myocardial infarction) [facility])     Past Surgical History  Procedure Laterality Date  . Left heart catheterization with coronary angiogram N/A 11/14/2014    Procedure: LEFT HEART CATHETERIZATION WITH CORONARY ANGIOGRAM;  Surgeon: Seth Bihari, MD;  Location: [facility] CATH LAB;  Service: Cardiovascular;  Laterality: N/A;  . Intra-aortic balloon pump insertion N/A 11/15/2014    Procedure: INTRA-AORTIC BALLOON PUMP INSERTION;  Surgeon: Seth Bihari, MD;  Location: [facility] CATH LAB;  Service: Cardiovascular;  Laterality: N/A;  . Percutaneous coronary stent intervention (pci-s) N/A 11/15/2014    Procedure: PERCUTANEOUS CORONARY STENT INTERVENTION (PCI-S);  Surgeon: Seth Bihari, MD;  Location: [facility] CATH LAB;  Service: Cardiovascular;  Laterality: N/A;  . Coronary artery bypass graft N/A 11/15/2014    Procedure: CORONARY ARTERY BYPASS GRAFTING (CABG), ON PUMP, TIMES THREE, USING LEFT INTERNAL MAMMARY ARTERY, RIGHT GREATER SAPHENOUS VEIN HARVESTED ENDOSCOPICALLY;  Surgeon: Seth Slot, MD;  Location: [facility] OR;  Service: Open Heart Surgery;  Laterality: N/A;  . Tee without cardioversion N/A 11/15/2014    Procedure: TRANSESOPHAGEAL ECHOCARDIOGRAM (TEE);  Surgeon: Seth Slot, MD;  Location: [facility] OR;  Service: Open Heart Surgery;  Laterality: N/A;     Current Outpatient Prescriptions  Medication Sig Dispense Refill  . acetaminophen (TYLENOL) 500 MG tablet Take 500 mg by mouth every 6 (six) hours as needed (pain).     Marland Kitchen aspirin EC 81 MG tablet Take 1 tablet (81 mg total) by mouth daily. 90 tablet 3  . atorvastatin (LIPITOR) 80 MG tablet Take 1 tablet (80  mg total) by mouth daily at 6 PM. 30 tablet 1  . lisinopril (PRINIVIL,ZESTRIL) 10 MG tablet Take 1 tablet (10 mg total) by mouth daily. 30 tablet 1  . metFORMIN (GLUCOPHAGE) 500 MG tablet Take 500 mg by mouth 2 (two) times daily with a meal.    . metoprolol tartrate (LOPRESSOR) 25 MG tablet Take 1 tablet (25 mg total) by mouth 2 (two) times daily. 60 tablet 1   No current facility-administered medications for this visit.    Allergies:   Coconut oil    Social History:  The patient  reports that he has never smoked. He has never used smokeless tobacco. He reports that he does not drink alcohol or use illicit drugs.   Family History:  The patient's family history includes Heart attack in his father; Heart disease in his mother; Stroke in his father and mother.    ROS:  Please see the history of present illness.    Review of Systems: Constitutional:  denies fever, chills, diaphoresis, appetite change and fatigue.  HEENT: denies photophobia, eye pain, redness, hearing loss, ear pain, congestion, sore throat, rhinorrhea, sneezing, neck pain, neck stiffness and tinnitus.  Respiratory: denies SOB, DOE,  cough, chest tightness, and wheezing.  Cardiovascular: denies chest pain, palpitations and leg swelling.  Gastrointestinal: denies nausea, vomiting, abdominal pain, diarrhea, constipation, blood in stool.  Genitourinary: denies dysuria, urgency, frequency, hematuria, flank pain and difficulty urinating.  Musculoskeletal: admits to  myalgias, back pain / significant right leg pain   Skin: denies pallor, rash and wound.  Neurological: denies dizziness, seizures, syncope, weakness, light-headedness, numbness and headaches.   Hematological: denies adenopathy, easy bruising, personal or family bleeding history.  Psychiatric/ Behavioral: denies suicidal ideation, mood changes, confusion, nervousness, sleep disturbance and agitation.       All other systems are reviewed and negative.    PHYSICAL  EXAM: VS:  BP 102/74 mmHg  Pulse 90  Ht 5\' 11"  (1.803 m)  Wt 266 lb 12.8 oz (121.02 kg)  BMI 37.23 kg/m2  SpO2 95% , BMI Body mass index is 37.23 kg/(m^2). GEN: Well nourished, well developed, in no acute distress HEENT: normal Neck: no JVD, carotid bruits, or masses Cardiac: RRR; no murmurs, rubs, or gallops,no edema . His sternotomy scar is healing well.  The thoracostomy sites are healing well. Respiratory:  clear to auscultation bilaterally, normal work of breathing.  GI: soft, nontender, nondistended, + BS MS: no deformity or atrophyThe saphenous vein harvest sites are healing well. Skin: warm and dry, no rash Neuro:  Strength and sensation are intact Psych: normal   EKG:  EKG is ordered today.   Recent Labs: 11/16/2014: Magnesium 2.0 11/19/2014: Hemoglobin 12.7*; Platelets 145* 04/27/2015: ALT 28; BUN 18; Creatinine, Ser 1.03; Potassium 4.3; Sodium 138    Lipid Panel    Component Value Date/Time   CHOL 93 04/27/2015 0955   TRIG 78.0 04/27/2015 0955   HDL 28.30* 04/27/2015 0955   CHOLHDL 3 04/27/2015 0955   VLDL 15.6 04/27/2015 0955   LDLCALC 49 04/27/2015 0955      Wt Readings from Last 3 Encounters:  10/25/15 266 lb 12.8 oz (121.02 kg)  04/27/15 244 lb (110.678 kg)  01/05/15 243 lb 12 oz (110.564 kg)      Other studies Reviewed: Additional studies/ records that were reviewed today include: . Review of the above records demonstrates:    ASSESSMENT AND PLAN:  1.  Coronary artery disease: He status post coronary artery bypass grafting. -LIMA to LAD -SVG to DIAGONAL 1 -SVG to OM1  He's not having any episodes of angina. We will continue the current dose of aspirin. I anticipate decreasing his aspirin to 81 mg a day .  I'll see him again in 6 months. Will check fasting labs today   2. Chronic systolic congestive heart failure: The patient had an acute anterior wall myocardial infarction resulting in decreased LV function. His echocardiogram shows that his  left ventricle systolic function has improved since his second heart cavitation. I do not have an inaccurate LV EF but the description from Dr. Dorris Fetch  describes an akinetic anterior wall. His ejection fraction is now 40-45% and he does not need an ICD . Will check an echo .   Marland Kitchen He'll continue with the metoprolol and lisinopril for now.    3. Hyperlipidemia: Continue atorvastatin and will also refer him to a nutritionist. -  Check labs today   4. Leg pain :  Has gradually improved.  Walking regularly .   Current medicines are reviewed at length with the patient today.  The patient does not have concerns regarding medicines.  The following changes have been made:  no change  Labs/ tests ordered today  include:   No orders of the defined types were placed in this encounter.     Disposition:   FU with me in 6  months.    Signed, Ladislaus Repsher, Deloris Ping, MD  10/25/2015 9:49 AM    [facility] [address] [place], [place], [place]  [address] Phone: [phone]; Fax: [phone]

## 2015-10-25 NOTE — Patient Instructions (Signed)
Medication Instructions:  Your physician recommends that you continue on your current medications as directed. Please refer to the Current Medication list given to you today.   Labwork: TODAY - complete metabolic panel, cholesterol   Testing/Procedures: Your physician has requested that you have an echocardiogram. Echocardiography is a painless test that uses sound waves to create images of your heart. It provides your doctor with information about the size and shape of your heart and how well your heart's chambers and valves are working. This procedure takes approximately one hour. There are no restrictions for this procedure.    Follow-Up: Your physician wants you to follow-up in: 6 months with Dr. Elease Hashimoto.  You will receive a reminder letter in the mail two months in advance. If you don't receive a letter, please call our office to schedule the follow-up appointment.   If you need a refill on your cardiac medications before your next appointment, please call your pharmacy.   Thank you for choosing [facility]! Eligha Bridegroom, RN [phone]

## 2015-10-26 ENCOUNTER — Telehealth: Payer: Self-pay | Admitting: Cardiovascular Disease

## 2015-10-26 DIAGNOSIS — Z79899 Other long term (current) drug therapy: Secondary | ICD-10-CM

## 2015-10-26 MED ORDER — ROSUVASTATIN CALCIUM 20 MG PO TABS: 20.0000 mg | ORAL_TABLET | Freq: Every day | ORAL | Status: DC

## 2015-10-26 NOTE — Telephone Encounter (Signed)
Returning your call. °

## 2015-10-26 NOTE — Telephone Encounter (Signed)
Called patient back about lab results. Per Dr. Elease Hashimoto, His trigs and cholesterol are still elevated. I suggest that we stop the Atorvastatin and start generic rosuvatatin 20 mg a day chec CMET and lipids in 3 months. Patient verbalized understanding. Patient will come in 01/26/16 for repeat labs.

## 2015-11-07 ENCOUNTER — Other Ambulatory Visit: Payer: Self-pay

## 2015-11-07 ENCOUNTER — Ambulatory Visit (INDEPENDENT_AMBULATORY_CARE_PROVIDER_SITE_OTHER): Payer: BC Managed Care – PPO

## 2015-11-07 DIAGNOSIS — I2510 Atherosclerotic heart disease of native coronary artery without angina pectoris: Secondary | ICD-10-CM | POA: Diagnosis not present

## 2015-11-07 DIAGNOSIS — I5022 Chronic systolic (congestive) heart failure: Secondary | ICD-10-CM | POA: Diagnosis not present

## 2015-11-07 DIAGNOSIS — Z951 Presence of aortocoronary bypass graft: Secondary | ICD-10-CM

## 2015-11-08 ENCOUNTER — Encounter: Payer: Self-pay | Admitting: Cardiovascular Disease

## 2016-01-26 ENCOUNTER — Other Ambulatory Visit (INDEPENDENT_AMBULATORY_CARE_PROVIDER_SITE_OTHER): Payer: BC Managed Care – PPO | Admitting: *Deleted

## 2016-01-26 DIAGNOSIS — Z79899 Other long term (current) drug therapy: Secondary | ICD-10-CM

## 2016-01-26 LAB — COMPREHENSIVE METABOLIC PANEL WITH GFR
ALT: 35 U/L (ref 9–46)
AST: 24 U/L (ref 10–35)
Albumin: 4.3 g/dL (ref 3.6–5.1)
Alkaline Phosphatase: 96 U/L (ref 40–115)
BUN: 19 mg/dL (ref 7–25)
CO2: 25 mmol/L (ref 20–31)
Calcium: 9.1 mg/dL (ref 8.6–10.3)
Chloride: 102 mmol/L (ref 98–110)
Creat: 1.15 mg/dL (ref 0.70–1.33)
Glucose, Bld: 123 mg/dL — ABNORMAL HIGH (ref 65–99)
Potassium: 4.1 mmol/L (ref 3.5–5.3)
Sodium: 139 mmol/L (ref 135–146)
Total Bilirubin: 0.9 mg/dL (ref 0.2–1.2)
Total Protein: 6.8 g/dL (ref 6.1–8.1)

## 2016-01-26 LAB — LIPID PANEL
Cholesterol: 121 mg/dL — ABNORMAL LOW (ref 125–200)
HDL: 25 mg/dL — AB (ref >=40)
LDL Cholesterol: 64 mg/dL (ref <130)
Total CHOL/HDL Ratio: 4.8 Ratio (ref <=5.0)
Triglycerides: 160 mg/dL — ABNORMAL HIGH (ref <150)
VLDL: 32 mg/dL — ABNORMAL HIGH (ref <30)

## 2016-04-26 ENCOUNTER — Emergency Department: Payer: BC Managed Care – PPO

## 2016-04-26 ENCOUNTER — Emergency Department
Admission: EM | Admit: 2016-04-26 | Discharge: 2016-04-26 | Disposition: A | Discharge status: Home / Self Care | Payer: BC Managed Care – PPO | Attending: Emergency Medicine | Admitting: Emergency Medicine

## 2016-04-26 ENCOUNTER — Encounter: Payer: Self-pay | Admitting: Emergency Medicine

## 2016-04-26 DIAGNOSIS — Z7982 Long term (current) use of aspirin: Secondary | ICD-10-CM | POA: Diagnosis not present

## 2016-04-26 DIAGNOSIS — I5022 Chronic systolic (congestive) heart failure: Secondary | ICD-10-CM | POA: Insufficient documentation

## 2016-04-26 DIAGNOSIS — I2510 Atherosclerotic heart disease of native coronary artery without angina pectoris: Secondary | ICD-10-CM | POA: Insufficient documentation

## 2016-04-26 DIAGNOSIS — R109 Unspecified abdominal pain: Secondary | ICD-10-CM | POA: Diagnosis present

## 2016-04-26 DIAGNOSIS — E119 Type 2 diabetes mellitus without complications: Secondary | ICD-10-CM | POA: Insufficient documentation

## 2016-04-26 DIAGNOSIS — N200 Calculus of kidney: Secondary | ICD-10-CM | POA: Diagnosis not present

## 2016-04-26 DIAGNOSIS — I252 Old myocardial infarction: Secondary | ICD-10-CM | POA: Insufficient documentation

## 2016-04-26 DIAGNOSIS — N390 Urinary tract infection, site not specified: Secondary | ICD-10-CM | POA: Diagnosis not present

## 2016-04-26 DIAGNOSIS — Z79899 Other long term (current) drug therapy: Secondary | ICD-10-CM | POA: Insufficient documentation

## 2016-04-26 DIAGNOSIS — Z7984 Long term (current) use of oral hypoglycemic drugs: Secondary | ICD-10-CM | POA: Insufficient documentation

## 2016-04-26 LAB — CBC
HCT: 46.3 % (ref 40.0–52.0)
Hemoglobin: 16.3 g/dL (ref 13.0–18.0)
MCH: 31 pg (ref 26.0–34.0)
MCHC: 35.1 g/dL (ref 32.0–36.0)
MCV: 88.4 fL (ref 80.0–100.0)
PLATELETS: 155 10*3/uL (ref 150–440)
RBC: 5.24 MIL/uL (ref 4.40–5.90)
RDW: 12.7 % (ref 11.5–14.5)
WBC: 9.9 10*3/uL (ref 3.8–10.6)

## 2016-04-26 LAB — COMPREHENSIVE METABOLIC PANEL
ALK PHOS: 74 U/L (ref 38–126)
ALT: 42 U/L (ref 17–63)
AST: 31 U/L (ref 15–41)
Albumin: 4.3 g/dL (ref 3.5–5.0)
Anion gap: 7 (ref 5–15)
BUN: 18 mg/dL (ref 6–20)
CALCIUM: 9.3 mg/dL (ref 8.9–10.3)
CHLORIDE: 104 mmol/L (ref 101–111)
CO2: 27 mmol/L (ref 22–32)
CREATININE: 1.38 mg/dL — AB (ref 0.61–1.24)
GFR calc Af Amer: >60 mL/min (ref >60)
GFR calc non Af Amer: 56 mL/min — ABNORMAL LOW (ref >60)
GLUCOSE: 139 mg/dL — AB (ref 65–99)
Potassium: 4 mmol/L (ref 3.5–5.1)
SODIUM: 138 mmol/L (ref 135–145)
Total Bilirubin: 1.5 mg/dL — ABNORMAL HIGH (ref 0.3–1.2)
Total Protein: 7.4 g/dL (ref 6.5–8.1)

## 2016-04-26 LAB — URINALYSIS COMPLETE WITH MICROSCOPIC (ARMC ONLY)
BILIRUBIN URINE: NEGATIVE
Glucose, UA: NEGATIVE mg/dL
KETONES UR: NEGATIVE mg/dL
Leukocytes, UA: NEGATIVE
NITRITE: NEGATIVE
PH: 5 (ref 5.0–8.0)
Protein, ur: 100 mg/dL — AB
Specific Gravity, Urine: 1.026 (ref 1.005–1.030)

## 2016-04-26 MED ORDER — KETOROLAC TROMETHAMINE 30 MG/ML IJ SOLN
30.0000 mg | Freq: Once | INTRAMUSCULAR | Status: AC
  Administered 2016-04-26: 30 mg via INTRAVENOUS

## 2016-04-26 MED ORDER — ONDANSETRON 4 MG PO TBDP
ORAL_TABLET | ORAL | Status: AC
  Administered 2016-04-26: 4 mg via ORAL
  Filled 2016-04-26: qty 1

## 2016-04-26 MED ORDER — CEFUROXIME AXETIL 500 MG PO TABS: 500.0000 mg | ORAL_TABLET | Freq: Two times a day (BID) | ORAL | 0 refills | Status: AC

## 2016-04-26 MED ORDER — TAMSULOSIN HCL 0.4 MG PO CAPS
0.8000 mg | ORAL_CAPSULE | Freq: Once | ORAL | Status: AC
  Administered 2016-04-26: 0.8 mg via ORAL
  Filled 2016-04-26 (×2): qty 2

## 2016-04-26 MED ORDER — OXYCODONE-ACETAMINOPHEN 5-325 MG PO TABS: 1.0000 | ORAL_TABLET | Freq: Four times a day (QID) | ORAL | 0 refills | Status: DC | PRN

## 2016-04-26 MED ORDER — CEFUROXIME AXETIL 500 MG PO TABS
500.0000 mg | ORAL_TABLET | Freq: Two times a day (BID) | ORAL | Status: DC
  Administered 2016-04-26: 500 mg via ORAL
  Filled 2016-04-26: qty 1

## 2016-04-26 MED ORDER — KETOROLAC TROMETHAMINE 30 MG/ML IJ SOLN
INTRAMUSCULAR | Status: AC
  Filled 2016-04-26: qty 1

## 2016-04-26 MED ORDER — SODIUM CHLORIDE 0.9 % IV BOLUS (SEPSIS)
1000.0000 mL | Freq: Once | INTRAVENOUS | Status: AC
  Administered 2016-04-26: 1000 mL via INTRAVENOUS

## 2016-04-26 MED ORDER — ONDANSETRON 4 MG PO TBDP
4.0000 mg | ORAL_TABLET | Freq: Once | ORAL | Status: AC | PRN
  Administered 2016-04-26: 4 mg via ORAL

## 2016-04-26 MED ORDER — TAMSULOSIN HCL 0.4 MG PO CAPS: 0.4000 mg | ORAL_CAPSULE | Freq: Every day | ORAL | 0 refills | Status: DC

## 2016-04-26 NOTE — ED Triage Notes (Signed)
Sent over from [facility] with right flank pain which moves into testicle  pain started at 2 am

## 2016-04-26 NOTE — ED Provider Notes (Signed)
[facility] Emergency Department Provider Note   ____________________________________________   First MD Initiated Contact with Patient 04/26/16 1720     (approximate)  I have reviewed the triage vital signs and the nursing notes.   HISTORY  Chief Complaint Flank Pain   HPI Seth Riddle is a 55 y.o. male with a history of coronary artery disease as well as diabetes who is presenting with right lower back pain. He says that the pain started at 2 AM last night when he says it felt like "someone kicked me in the right testicle." However, he said that the pain today began radiating into his abdomen and through to his back. He says that is not tender to touch to his right testicle. No history of kidney stones. No burning with urination. Does not know if he has any family members with kidney stones. Says that he was nauseous earlier but denies any vomiting. No nausea at this time. Says the pain is a 3-4 out of 10 and sharp.   Past Medical History:  Diagnosis Date  . Coronary artery disease   . Diabetes mellitus without complication (HCC)   . Dyslipidemia (high LDL; low HDL)   . Family history of premature CAD   . Low HDL (under 40)   . MI (myocardial infarction) [facility])     Patient Active Problem List   Diagnosis Date Noted  . Chronic systolic CHF (congestive heart failure) (HCC) 01/05/2015  . 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 ST elevation (STEMI) myocardial infarction involving left anterior descending coronary artery (HCC)   . Dyslipidemia (high LDL; low HDL)   . Family history of premature CAD   . Acute chest pain   . NSTEMI (non-ST elevated myocardial infarction) (HCC) 11/13/2014  . Plantar fasciitis, right 12/10/2012    Past Surgical History:  Procedure Laterality Date  . CORONARY ARTERY BYPASS GRAFT N/A 11/15/2014   Procedure: CORONARY ARTERY BYPASS GRAFTING (CABG), ON PUMP, TIMES THREE, USING LEFT INTERNAL MAMMARY ARTERY, RIGHT GREATER SAPHENOUS  VEIN HARVESTED ENDOSCOPICALLY;  Surgeon: Loreli Slot, MD;  Location: [facility] OR;  Service: Open Heart Surgery;  Laterality: N/A;  . INTRA-AORTIC BALLOON PUMP INSERTION N/A 11/15/2014   Procedure: INTRA-AORTIC BALLOON PUMP INSERTION;  Surgeon: Lennette Bihari, MD;  Location: [facility] CATH LAB;  Service: Cardiovascular;  Laterality: N/A;  . LEFT HEART CATHETERIZATION WITH CORONARY ANGIOGRAM N/A 11/14/2014   Procedure: LEFT HEART CATHETERIZATION WITH CORONARY ANGIOGRAM;  Surgeon: Lennette Bihari, MD;  Location: [facility] CATH LAB;  Service: Cardiovascular;  Laterality: N/A;  . PERCUTANEOUS CORONARY STENT INTERVENTION (PCI-S) N/A 11/15/2014   Procedure: PERCUTANEOUS CORONARY STENT INTERVENTION (PCI-S);  Surgeon: Lennette Bihari, MD;  Location: [facility] CATH LAB;  Service: Cardiovascular;  Laterality: N/A;  . TEE WITHOUT CARDIOVERSION N/A 11/15/2014   Procedure: TRANSESOPHAGEAL ECHOCARDIOGRAM (TEE);  Surgeon: Loreli Slot, MD;  Location: [facility] OR;  Service: Open Heart Surgery;  Laterality: N/A;    Prior to Admission medications   Medication Sig Start Date End Date Taking? Authorizing Provider  acetaminophen (TYLENOL) 500 MG tablet Take 500 mg by mouth every 6 (six) hours as needed (pain).     Historical Provider, MD  aspirin EC 81 MG tablet Take 1 tablet (81 mg total) by mouth daily. 01/05/15   Vesta Mixer, MD  lisinopril (PRINIVIL,ZESTRIL) 10 MG tablet Take 1 tablet (10 mg total) by mouth daily. 11/21/14   Donielle Margaretann Loveless, PA-C  metFORMIN (GLUCOPHAGE) 500 MG tablet Take 500 mg by  mouth 2 (two) times daily with a meal.    Historical Provider, MD  metoprolol tartrate (LOPRESSOR) 25 MG tablet Take 1 tablet (25 mg total) by mouth 2 (two) times daily. 11/21/14   Donielle Margaretann Loveless, PA-C  rosuvastatin (CRESTOR) 20 MG tablet Take 1 tablet (20 mg total) by mouth daily. 10/26/15   Vesta Mixer, MD    Allergies Coconut oil  Family History  Problem Relation Age of Onset  . Heart disease Mother   . Stroke Mother     . Heart attack Father   . Stroke Father     Social History Social History  Substance Use Topics  . Smoking status: Never Smoker  . Smokeless tobacco: Never Used  . Alcohol use No    Review of Systems Constitutional: No fever/chills Eyes: No visual changes. ENT: No sore throat. Cardiovascular: Denies chest pain. Respiratory: Denies shortness of breath. Gastrointestinal:  no vomiting.  No diarrhea.  No constipation. Genitourinary: Negative for dysuria. Musculoskeletal: as above Skin: Negative for rash. Neurological: Negative for headaches, focal weakness or numbness.  10-point ROS otherwise negative.  ____________________________________________   PHYSICAL EXAM:  VITAL SIGNS: ED Triage Vitals  Enc Vitals Group     BP 04/26/16 1714 123/80     Pulse Rate 04/26/16 1714 63     Resp 04/26/16 1714 20     Temp 04/26/16 1714 98 F (36.7 C)     Temp Source 04/26/16 1714 Oral     SpO2 04/26/16 1714 97 %     Weight 04/26/16 1713 266 lb (120.7 kg)     Height 04/26/16 1713 5\' 11"  (1.803 m)     Head Circumference --      Peak Flow --      Pain Score 04/26/16 1713 7     Pain Loc --      Pain Edu? --      Excl. in GC? --     Constitutional: Alert and oriented. Well appearing and in no acute distress. Eyes: Conjunctivae are normal. PERRL. EOMI. Head: Atraumatic. Nose: No congestion/rhinnorhea. Mouth/Throat: Mucous membranes are moist.  Neck: No stridor.   Cardiovascular: Normal rate, regular rhythm. Grossly normal heart sounds.   Respiratory: Normal respiratory effort.  No retractions. Lungs CTAB. Gastrointestinal: Soft and nontender. No distention. Mild low CVA tenderness on the right. Musculoskeletal: No lower extremity tenderness nor edema.  No joint effusions. Neurologic:  Normal speech and language. No gross focal neurologic deficits are appreciated.  Skin:  Skin is warm, dry and intact. No rash noted. Psychiatric: Mood and affect are normal. Speech and behavior are  normal.  ____________________________________________   LABS (all labs ordered are listed, but only abnormal results are displayed)  Labs Reviewed  COMPREHENSIVE METABOLIC PANEL - Abnormal; Notable for the following:       Result Value   Glucose, Bld 139 (*)    Creatinine, Ser 1.38 (*)    Total Bilirubin 1.5 (*)    GFR calc non Af Amer 56 (*)    All other components within normal limits  URINALYSIS COMPLETEWITH MICROSCOPIC ([facility] ONLY) - Abnormal; Notable for the following:    Color, Urine YELLOW (*)    APPearance CLOUDY (*)    Hgb urine dipstick 3+ (*)    Protein, ur 100 (*)    Bacteria, UA RARE (*)    Squamous Epithelial / LPF 0-5 (*)    All other components within normal limits  URINE CULTURE  CBC   ____________________________________________  EKG  ____________________________________________  RADIOLOGY CT RENAL STONE STUDY (Accession [id number]) (Order [id number])  Imaging  Date: 04/26/2016 Department: [facility] EMERGENCY DEPARTMENT Released By/Authorizing: Myrna Blazeravid Matthew Wirt Hemmerich, MD (auto-released)  PACS Images   Show images for CT RENAL STONE STUDY  Study Result   CLINICAL DATA:  Right flank pain.  EXAM: CT ABDOMEN AND PELVIS WITHOUT CONTRAST  TECHNIQUE: Multidetector CT imaging of the abdomen and pelvis was performed following the standard protocol without IV contrast.  COMPARISON:  None.  FINDINGS: Lower chest: No acute abnormality.  Hepatobiliary: No focal liver abnormality is seen. Small stones noted within the gallbladder neck. No gallbladder wall thickening or pericholecystic fluid. No biliary dilatation.  Pancreas: Unremarkable. No pancreatic ductal dilatation or surrounding inflammatory changes.  Spleen: Normal in size without focal abnormality.  Adrenals/Urinary Tract: Adrenal glands are unremarkable. Stone within the inferior pole of the right kidney measures 5 mm. There is right-sided hydronephrosis.  There is a stone at the right UPJ which measures 7 mm, image 44 of series 2. Inferior pole right renal cyst is incompletely characterized without IV contrast measuring 5.8 cm. Punctate stone is identified within the inferior pole of the left kidney measuring 2 mm, image 41 of series 2. No left-sided hydronephrosis or hydroureter. The urinary bladder is normal.  Stomach/Bowel: The stomach is normal. The small bowel loops are unremarkable. The appendix is visualized and appears normal. No pathologic dilatation of the colon.  Vascular/Lymphatic: Calcified atherosclerotic disease involves the abdominal aorta. No aneurysm. No enlarged retroperitoneal or mesenteric adenopathy. No enlarged pelvic or inguinal lymph nodes.  Reproductive: Prostate is unremarkable.  Other: No abdominal wall hernia or abnormality. No abdominopelvic ascites.  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 This is most advanced at L5-S1.  IMPRESSION: 1. Right UPJ calculus measures 7 mm. This results and right-sided hydronephrosis. 2. Nonobstructing calculi within the inferior pole the right kidney are also noted. 3. Aortic atherosclerosis   Electronically Signed   By: Signa Kellaylor  Stroud M.D.   On: 04/26/2016 19:02      ____________________________________________   PROCEDURES  Procedure(s) performed:   Procedures  Critical Care performed:   ____________________________________________   INITIAL IMPRESSION / ASSESSMENT AND PLAN / ED COURSE  Pertinent labs & imaging results that were available during my care of the patient were reviewed by me and considered in my medical decision making (see chart for details).  ----------------------------------------- 8:37 PM on 04/26/2016 -----------------------------------------  Patient with a 1 out of 10 pain after Toradol. No nausea or vomiting at this time. Found to have a 7 mm UPJ stone. White blood cells in the  urine but this may be from a large amount of red blood cells. The patient does not have any leukocyte esterase or nitrite associated. I discussed case with Dr. Selena BattenKim who says the patient should be tried with a spontaneous trial of passage but with Flomax. I will also give the patient cefuroxime to cover him for infection. He'll also be given a strainer. He knows to follow up with the urologist for further evaluation for possible lithotripsy. He'll also given Percocet to go home with. He is understanding of these plans and Sharon SellerLloyd to comply. I also discussed strict return precautions including worsening pain, nausea vomiting or fever. He understands and is willing to comply.  Clinical Course     ____________________________________________   FINAL CLINICAL IMPRESSION(S) / ED DIAGNOSES  Final diagnoses:  Right flank pain  Kidney stones. UTI.    NEW MEDICATIONS STARTED DURING THIS  VISIT:  New Prescriptions   No medications on file     Note:  This document was prepared using Dragon voice recognition software and may include unintentional dictation errors.    Myrna Blazer, MD 04/26/16 2038

## 2016-04-26 NOTE — ED Notes (Signed)
Pt discharged to home.  Family member driving.  Discharge instructions reviewed.  Verbalized understanding.  No questions or concerns at this time.  Teach back verified.  Pt in NAD.  No items left in ED.   

## 2016-04-26 NOTE — ED Notes (Signed)
Called pharmacy to get Ceftin tablet sent to ED.

## 2016-04-28 LAB — URINE CULTURE

## 2016-05-01 ENCOUNTER — Encounter: Payer: Self-pay | Admitting: Urology

## 2016-05-01 ENCOUNTER — Telehealth: Payer: Self-pay | Admitting: Radiology

## 2016-05-01 ENCOUNTER — Telehealth: Payer: Self-pay | Admitting: Cardiovascular Disease

## 2016-05-01 ENCOUNTER — Ambulatory Visit (INDEPENDENT_AMBULATORY_CARE_PROVIDER_SITE_OTHER): Payer: BC Managed Care – PPO | Admitting: Urology

## 2016-05-01 ENCOUNTER — Other Ambulatory Visit: Payer: Self-pay | Admitting: Radiology

## 2016-05-01 VITALS — BP 136/84 | HR 98 | Ht 71.0 in | Wt 264.0 lb

## 2016-05-01 DIAGNOSIS — N201 Calculus of ureter: Secondary | ICD-10-CM | POA: Diagnosis not present

## 2016-05-01 DIAGNOSIS — N281 Cyst of kidney, acquired: Secondary | ICD-10-CM | POA: Diagnosis not present

## 2016-05-01 DIAGNOSIS — N1330 Unspecified hydronephrosis: Secondary | ICD-10-CM | POA: Diagnosis not present

## 2016-05-01 DIAGNOSIS — N179 Acute kidney failure, unspecified: Secondary | ICD-10-CM

## 2016-05-01 LAB — URINALYSIS, COMPLETE
BILIRUBIN UA: NEGATIVE
Glucose, UA: NEGATIVE
KETONES UA: NEGATIVE
Nitrite, UA: NEGATIVE
PROTEIN UA: NEGATIVE
SPEC GRAV UA: 1.02 (ref 1.005–1.030)
Urobilinogen, Ur: 0.2 mg/dL (ref 0.2–1.0)
pH, UA: 5.5 (ref 5.0–7.5)

## 2016-05-01 LAB — MICROSCOPIC EXAMINATION: Epithelial Cells (non renal): NONE SEEN /hpf (ref 0–10)

## 2016-05-01 MED ORDER — TRAMADOL HCL 50 MG PO TABS: 50.0000 mg | ORAL_TABLET | Freq: Four times a day (QID) | ORAL | 0 refills | Status: DC | PRN

## 2016-05-01 NOTE — Telephone Encounter (Signed)
Seth Riddle may hold his aspirin for 72 hours prior to lithrotrypsy procedure . He is at low risk for this procedure

## 2016-05-01 NOTE — Telephone Encounter (Signed)
Notified pt of ESWL scheduled with Dr Apolinar Junes on 05/16/16 with arrival time to [facility] of 6:15am & EKG scheduled at pre-admit testing office on 05/13/16 @10 :00. Pt voices understanding.

## 2016-05-01 NOTE — Telephone Encounter (Signed)
Will forward to Dr. Elease Hashimoto per his request.

## 2016-05-01 NOTE — Progress Notes (Signed)
05/01/2016 10:03 AM   Seth Riddle 1961/02/16 [id number]  Referring provider: [name], MD [phone] [facility] [place], [place] [address]  Chief Complaint  Patient presents with  . Nephrolithiasis    HPI: 55 year old male who presents today as follow-up from an emergency room visit on 04/26/2016. He presented to the emergency room with acute onset right flank pain radiating to the right testicle. CT scan showed an obstructing 7 mm right UPJ stone with moderate hydronephrosis.  On CT scan, he also has an approximate 6 mm right nonobstructing lower pole stone. There is also a 5.8 cm right lower pole renal cyst which is not fully characterize a noncontrast CT scan.  He had no leukocytosis but is somewhat suspicious urinalysis. His urine culture was ultimately negative.  He denies any fevers or chills. No nausea or vomiting.  He last had pain this AM.  He has been straining his urine and not seen it pass.  No personal history of kidney stones.    History of CAD s/p CABG on ASA.   PMH: Past Medical History:  Diagnosis Date  . Coronary artery disease   . Diabetes mellitus without complication (HCC)   . Dyslipidemia (high LDL; low HDL)   . Family history of premature CAD   . Low HDL (under 40)   . MI (myocardial infarction) [facility])     Surgical History: Past Surgical History:  Procedure Laterality Date  . CORONARY ARTERY BYPASS GRAFT N/A 11/15/2014   Procedure: CORONARY ARTERY BYPASS GRAFTING (CABG), ON PUMP, TIMES THREE, USING LEFT INTERNAL MAMMARY ARTERY, RIGHT GREATER SAPHENOUS VEIN HARVESTED ENDOSCOPICALLY;  Surgeon: Loreli Slot, MD;  Location: [facility] OR;  Service: Open Heart Surgery;  Laterality: N/A;  . INTRA-AORTIC BALLOON PUMP INSERTION N/A 11/15/2014   Procedure: INTRA-AORTIC BALLOON PUMP INSERTION;  Surgeon: Lennette Bihari, MD;  Location[facility] CATH LAB;  Service: Cardiovascular;  Laterality: N/A;  . LEFT HEART  CATHETERIZATION WITH CORONARY ANGIOGRAM N/A 11/14/2014   Procedure: LEFT HEART CATHETERIZATION WITH CORONARY ANGIOGRAM;  Surgeon: Lennette Bihari, MD;  Location: [facility] CATH LAB;  Service: Cardiovascular;  Laterality: N/A;  . PERCUTANEOUS CORONARY STENT INTERVENTION (PCI-S) N/A 11/15/2014   Procedure: PERCUTANEOUS CORONARY STENT INTERVENTION (PCI-S);  Surgeon: Lennette Bihari, MD;  Location: [facility] CATH LAB;  Service: Cardiovascular;  Laterality: N/A;  . TEE WITHOUT CARDIOVERSION N/A 11/15/2014   Procedure: TRANSESOPHAGEAL ECHOCARDIOGRAM (TEE);  Surgeon: Loreli Slot, MD;  Location: [facility] OR;  Service: Open Heart Surgery;  Laterality: N/A;    Home Medications:    Medication List       Accurate as of 05/01/16 10:03 AM. Always use your most recent med list.          aspirin EC 81 MG tablet Take 1 tablet (81 mg total) by mouth daily.   cefUROXime 500 MG tablet Commonly known as:  CEFTIN Take 1 tablet (500 mg total) by mouth 2 (two) times daily.   lisinopril 10 MG tablet Commonly known as:  PRINIVIL,ZESTRIL Take 1 tablet (10 mg total) by mouth daily.   metFORMIN 500 MG tablet Commonly known as:  GLUCOPHAGE Take 500 mg by mouth 2 (two) times daily with a meal.   metoprolol tartrate 25 MG tablet Commonly known as:  LOPRESSOR Take 1 tablet (25 mg total) by mouth 2 (two) times daily.   oxyCODONE-acetaminophen 5-325 MG tablet Commonly known as:  ROXICET Take 1-2 tablets by mouth every 6 (six) hours as  needed.   rosuvastatin 20 MG tablet Commonly known as:  CRESTOR Take 1 tablet (20 mg total) by mouth daily.   tamsulosin 0.4 MG Caps capsule Commonly known as:  FLOMAX Take 1 capsule (0.4 mg total) by mouth daily.   traMADol 50 MG tablet Commonly known as:  ULTRAM Take 1 tablet (50 mg total) by mouth every 6 (six) hours as needed.       Allergies:  Allergies  Allergen Reactions  . Coconut Oil Hives    Family History: Family History  Problem Relation Age of Onset  . Heart  disease Mother   . Stroke Mother   . Nephrolithiasis Mother   . Heart attack Father   . Stroke Father   . Kidney cancer Neg Hx   . Bladder Cancer Neg Hx   . Prostate cancer Neg Hx     Social History:  reports that he has never smoked. He has never used smokeless tobacco. He reports that he does not drink alcohol or use drugs.  ROS: UROLOGY Frequent Urination?: No Hard to postpone urination?: No Burning/pain with urination?: No Get up at night to urinate?: No Leakage of urine?: No Urine stream starts and stops?: No Trouble starting stream?: No Do you have to strain to urinate?: No Blood in urine?: No Urinary tract infection?: No Sexually transmitted disease?: No Injury to kidneys or bladder?: No Painful intercourse?: No Weak stream?: No Erection problems?: No Penile pain?: No  Gastrointestinal Nausea?: Yes Vomiting?: No Indigestion/heartburn?: No Diarrhea?: No Constipation?: No  Constitutional Fever: No Night sweats?: No Weight loss?: No Fatigue?: No  Skin Skin rash/lesions?: No Itching?: No  Eyes Blurred vision?: No Double vision?: No  Ears/Nose/Throat Sore throat?: No Sinus problems?: No  Hematologic/Lymphatic Swollen glands?: No Easy bruising?: No  Cardiovascular Leg swelling?: No Chest pain?: No  Respiratory Cough?: No Shortness of breath?: No  Endocrine Excessive thirst?: No  Musculoskeletal Back pain?: Yes Joint pain?: No  Neurological Headaches?: No Dizziness?: No  Psychologic Depression?: No Anxiety?: No  Physical Exam: BP 136/84   Pulse 98   Ht 5\' 11"  (1.803 m)   Wt 264 lb (119.7 kg)   BMI 36.82 kg/m   Constitutional:  Alert and oriented, No acute distress. Presents with wife today. HEENT: [place] AT, moist mucus membranes.  Trachea midline, no masses. Cardiovascular: No clubbing, cyanosis, or edema. Respiratory: Normal respiratory effort, no increased work of breathing. GI: Abdomen is soft, nontender, nondistended, no  abdominal masses GU: No CVA tenderness.  Skin: No rashes, bruises or suspicious lesions. Neurologic: Grossly intact, no focal deficits, moving all 4 extremities. Psychiatric: Normal mood and affect.  Laboratory Data: Lab Results  Component Value Date   WBC 9.9 04/26/2016   HGB 16.3 04/26/2016   HCT 46.3 04/26/2016   MCV 88.4 04/26/2016   PLT 155 04/26/2016    Lab Results  Component Value Date   CREATININE 1.38 (H) 04/26/2016     Lab Results  Component Value Date   HGBA1C 6.6 (H) 11/13/2014    Urinalysis Component     Latest Ref Rng & Units 04/26/2016  Color, Urine     YELLOW YELLOW (A)  Appearance     CLEAR CLOUDY (A)  Glucose     NEGATIVE mg/dL NEGATIVE  Bilirubin Urine     NEGATIVE NEGATIVE  Ketones, ur     NEGATIVE mg/dL NEGATIVE  Specific Gravity, Urine     1.005 - 1.030 1.026  Hgb urine dipstick     NEGATIVE 3+ (A)  pH     5.0 - 8.0 5.0  Protein     NEGATIVE mg/dL 161 (A)  Nitrite     NEGATIVE NEGATIVE  Leukocytes, UA     NEGATIVE NEGATIVE  RBC / HPF     0 - 5 RBC/hpf TOO NUMEROUS TO COUNT  WBC, UA     0 - 5 WBC/hpf TOO NUMEROUS TO COUNT  Bacteria, UA     NONE SEEN RARE (A)  Squamous Epithelial / LPF     NONE SEEN 0-5 (A)  Mucous      PRESENT  Budding Yeast      PRESENT  Urobilinogen, UA     0.0 - 1.0 mg/dL    Component     Latest Ref Rng & Units 04/26/2016  Specimen Description      URINE, RANDOM  Special Requests      NONE  Culture      8,000 COLONIES/mL INSIGNIFICANT GROWTH (A) . . .  Report Status      04/28/2016 FINAL    Pertinent Imaging: CLINICAL DATA:  Right flank pain.  EXAM: CT ABDOMEN AND PELVIS WITHOUT CONTRAST  TECHNIQUE: Multidetector CT imaging of the abdomen and pelvis was performed following the standard protocol without IV contrast.  COMPARISON:  None.  FINDINGS: Lower chest: No acute abnormality.  Hepatobiliary: No focal liver abnormality is seen. Small stones noted within the gallbladder neck. No  gallbladder wall thickening or pericholecystic fluid. No biliary dilatation.  Pancreas: Unremarkable. No pancreatic ductal dilatation or surrounding inflammatory changes.  Spleen: Normal in size without focal abnormality.  Adrenals/Urinary Tract: Adrenal glands are unremarkable. Stone within the inferior pole of the right kidney measures 5 mm. There is right-sided hydronephrosis. There is a stone at the right UPJ which measures 7 mm, image 44 of series 2. Inferior pole right renal cyst is incompletely characterized without IV contrast measuring 5.8 cm. Punctate stone is identified within the inferior pole of the left kidney measuring 2 mm, image 41 of series 2. No left-sided hydronephrosis or hydroureter. The urinary bladder is normal.  Stomach/Bowel: The stomach is normal. The small bowel loops are unremarkable. The appendix is visualized and appears normal. No pathologic dilatation of the colon.  Vascular/Lymphatic: Calcified atherosclerotic disease involves the abdominal aorta. No aneurysm. No enlarged retroperitoneal or mesenteric adenopathy. No enlarged pelvic or inguinal lymph nodes.  Reproductive: Prostate is unremarkable.  Other: No abdominal wall hernia or abnormality. No abdominopelvic ascites.  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 This is most advanced at L5-S1.  IMPRESSION: 1. Right UPJ calculus measures 7 mm. This results and right-sided hydronephrosis. 2. Nonobstructing calculi within the inferior pole the right kidney are also noted. 3. Aortic atherosclerosis   Electronically Signed   By: Signa Kell M.D.   On: 04/26/2016 19:02   CT scan personally reviewed today and with the patient.  Assessment & Plan:    1. Obstruction of right ureteropelvic junction (UPJ) due to stone 7 mm right UPJ stone with hydronephrosis.  Hemodynamically stable without signs or symptoms of infection. Urine culture  negative.  We reviewed various options including continued medical expulsive therapy versus surgical intervention. His pain has been intermittently severe but tolerable for the most part.  We discussed various treatment options including ESWL vs. ureteroscopy, laser lithotripsy, and stent. We discussed the risks and benefits of both including bleeding, infection, damage to surrounding structures, efficacy with need for possible further intervention, and need for temporary ureteral stent.  He is  most interested in continuing medical expulsive therapy for the next 2 weeks and if the stone fails to pass, then proceeding to ESWL. He will continue Flomax until then and strain his urine. Warning symptoms including signs of infection were reviewed in detail including indications for urgent intervention with ureteral stent placement.  He will need cardiac clearance to hold his aspirin given his history of coronary artery disease.  All he and his wife's questions were answered today.  Additional prescription for tramadol #20 given today for pain control.  Schedule 05/16/2016 with continued medical expulsive therapy until that date. He will call and let us know if he passes a stone prior to shockwave lithotripsy.  - Urinalysis, Complete  2. Hydronephrosis, right Secondary to #1 - Urinalysis, Complete  3. Acute kidney injury (HCC) Likely secondary to obstructing ureteral stone. Advised sparing use of NSAIDs and other nephrotoxic drugs.  4. Renal cyst, acquired, right Recommend follow-up with renal ultrasound after the stone has passed to further characterize this cyst.   [name], MD  [facility] [address] [place], [place] [address] [phone]

## 2016-05-01 NOTE — Telephone Encounter (Signed)
Faxed note to [phone] to the attention of Amy with Dr. Harvie Bridge response that he may hold ASA 72 hrs prior to lithrotryost.

## 2016-05-01 NOTE — Telephone Encounter (Signed)
New message       Request for surgical clearance:  What type of surgery is being performed? Shock wave lithotripsy 1. When is this surgery scheduled?  05-16-16  2. Are there any medications that need to be held prior to surgery and how long? Hold aspirin 72hrs prior and cardiac clearance   Name of physician performing surgery? Dr Apolinar Junes What is your office phone and fax number?  Fax [phone]

## 2016-05-05 ENCOUNTER — Other Ambulatory Visit: Payer: Self-pay | Admitting: Physician Assistant

## 2016-05-05 DIAGNOSIS — I10 Essential (primary) hypertension: Secondary | ICD-10-CM | POA: Insufficient documentation

## 2016-05-05 MED ORDER — METOPROLOL TARTRATE 25 MG PO TABS: 25.0000 mg | ORAL_TABLET | Freq: Two times a day (BID) | ORAL | 3 refills | Status: DC

## 2016-05-05 NOTE — Progress Notes (Signed)
Patient and his wife called in to the answering service.  The patient has noted high BPs the last few days.  He has had a HA as well.  No TIA symptoms.  No chest pain, dyspnea, syncope. While on the phone, patient did admit to being out of Lisinopril and Metoprolol for some time now. He just refilled the Lisinopril and took his first dose today. I will send in a refill for Metoprolol to his pharmacy. If he does not feel any better with getting his BP down later this afternoon, he should go to the ED. Otherwise, I will ask the office to call him tomorrow to arrange FU. He agrees with this plan. Tereso Newcomer, PA-C   05/05/2016 3:18 PM

## 2016-05-06 ENCOUNTER — Encounter: Payer: Self-pay | Admitting: Cardiovascular Disease

## 2016-05-07 ENCOUNTER — Encounter: Payer: Self-pay | Admitting: Cardiovascular Disease

## 2016-05-07 ENCOUNTER — Ambulatory Visit (INDEPENDENT_AMBULATORY_CARE_PROVIDER_SITE_OTHER): Payer: BC Managed Care – PPO | Admitting: Cardiovascular Disease

## 2016-05-07 VITALS — BP 100/78 | HR 68 | Ht 71.0 in | Wt 262.1 lb

## 2016-05-07 DIAGNOSIS — I2510 Atherosclerotic heart disease of native coronary artery without angina pectoris: Secondary | ICD-10-CM

## 2016-05-07 DIAGNOSIS — G4710 Hypersomnia, unspecified: Secondary | ICD-10-CM

## 2016-05-07 DIAGNOSIS — I5022 Chronic systolic (congestive) heart failure: Secondary | ICD-10-CM | POA: Diagnosis not present

## 2016-05-07 DIAGNOSIS — R400 Somnolence: Secondary | ICD-10-CM

## 2016-05-07 MED ORDER — LISINOPRIL 5 MG PO TABS: 5.0000 mg | ORAL_TABLET | Freq: Every day | ORAL | 11 refills | Status: DC

## 2016-05-07 NOTE — Patient Instructions (Addendum)
Medication Instructions:  DECREASE Lisinopril to 5 mg once daily   Labwork: None Ordered   Testing/Procedures: Your physician has recommended that you have a sleep study. This test records several body functions during sleep, including: brain activity, eye movement, oxygen and carbon dioxide blood levels, heart rate and rhythm, breathing rate and rhythm, the flow of air through your mouth and nose, snoring, body muscle movements, and chest and belly movement.   Follow-Up: Your physician recommends that you return for follow up with Dr. Elease Hashimoto on Monday Oct. 2 at 9:30 am   If you need a refill on your cardiac medications before your next appointment, please call your pharmacy.   Thank you for choosing [facility]! Eligha Bridegroom, RN [phone]

## 2016-05-07 NOTE — Progress Notes (Signed)
Cardiology Office Note   Date:  05/07/2016   ID:  [name], DOB 1961/04/14, MRN [id number]  PCP:  Rozanna Box, MD  Cardiologist:   Kristeen Miss, MD   Chief Complaint  Patient presents with  . Coronary Artery Disease  . Congestive Heart Failure      History of Present Illness: Seth Riddle is a 55 y.o. male who presents for follow-up for his recent hospitalization for coronary artery disease, myocardial infarction and subsequent coronary artery bypass grafting. His admitted with symptoms of unstable angina. Cardiac catheterization the following day revealed significant three-vessel coronary artery disease.  The left main coronary artery was angiographically normal and bifurcated into the LAD and left circumflex coronary artery.  he LAD was a large caliber vessel and immmediately gave rise to a very high diagonal (ramus intermediate like vessel). There was diffuse 95% stenosis in the LAD in the region of this diagonal takeoff with 90-95% stenosis diffusely just beyond the origin of this ramus like vessel and 90% diffuse stenosis at the origin of the ramus like high diagonal vessel almost immediately after the left main. The left circumflex coronary artery was a moderate size vessel that gave rise to one major marginal branch. There was 95% focal stenosis just beyond an ectatic segment in the proximal obtuse marginal branch and there was diffuse distal 90% marginal stenosis. The RCA was angiographically normal , dominant vessel that gave rise to a large PDA and PLA vessel.  Left ventriculography revealed normal global LV contractility without focal segmental wall motion abnormalities. There was no evidence for mitral regurgitation. Ejection fraction is 55-60%.    Unfortunately, the night after his cardiac catheterization he occluded his left anterior descending artery and had ongoing chest pain. He returned to the Cath Lab the following morning an emergency balloon  angioplasty  was performed to open his LAD.    He was taken to emergent coronary artery bypass grafting at that time. Had a relatively slow recovery but has done fairly well. Echo done prior to DC revealed mild LV dysfunction:  Left ventricle: The cavity size was normal. Wall thickness was increased in a pattern of mild LVH. Systolic function was mildly reduced. The estimated ejection fraction was in the range of 45% to 50%. Diffuse hypokinesis. Doppler parameters are consistent with abnormal left ventricular relaxation (grade 1 diastolic dysfunction). - Right ventricle: The cavity size was mildly dilated. - Right atrium: The atrium was mildly dilated.  Impressions:  - Mild global reduction in LV function (EF 50); grade 1 diastolic dysfunction; mild RAE/RVE.    CABG  Left internal mammary artery to left anterior descending Saphenous vein graft to first diagonal Saphenous vein graft to first obtuse marginal   He has had significant right leg pain since the initial cath. He's had back surgery and the pain feels like a slipped disc.  Jan 05, 2015: Seth Riddle is doing well from a cardiac standpoint. He continues to have severe leg pain. He apparently had some injury that occurred following the initial cardiac catheterization prior to his bypass surgery. His echo from this week shows improved LV systolic function.  - Left ventricle: The cavity size was normal. Wall thickness was increased in a pattern of mild LVH. Systolic function was mildly to moderately reduced. The estimated ejection fraction was in the range of 40% to 45%. Akinesis of the apical myocardium. Moderate hypokinesis of the apicalanterior myocardium.  Sept. 8, 2016:  doing ok. Still has low stamina  Walking some.  Had been running on the treadmill at cardiac rehab.   October 25, 2015: Doing just ok still walking .   Prior to his MI, he could work all day, spend time with family and  still work 3 hours later in the night.  His stamina has gone.    Is aware of his hear beat.    Feels mildly exhausted all day , every day .     No CP or angina   Sept. 19, 2017  Seth Riddle is seen today for eval and management of his elevated BP  Hx of CAD with emergent CABG  Has been feeling poorly for the past week or so. Has had a headache ( 8/10)  BP has been high  He had run out of his BP for 10 days. Had his meds refilled this weekend.   No orthostasis Has not been sleeping well - anxious about issues and isn't sleeping well. Has been depressed.  Has been walking regularly  - 2 miles 3 days a week.  , walks sporadically  No angina pain  No CP  Had some left arm tingling in his left arm last night      Past Medical History:  Diagnosis Date  . Coronary artery disease   . Diabetes mellitus without complication (HCC)   . Dyslipidemia (high LDL; low HDL)   . Family history of premature CAD   . Low HDL (under 40)   . MI (myocardial infarction) [facility])     Past Surgical History:  Procedure Laterality Date  . CORONARY ARTERY BYPASS GRAFT N/A 11/15/2014   Procedure: CORONARY ARTERY BYPASS GRAFTING (CABG), ON PUMP, TIMES THREE, USING LEFT INTERNAL MAMMARY ARTERY, RIGHT GREATER SAPHENOUS VEIN HARVESTED ENDOSCOPICALLY;  Surgeon: Loreli Slot, MD;  Location: [facility] OR;  Service: Open Heart Surgery;  Laterality: N/A;  . INTRA-AORTIC BALLOON PUMP INSERTION N/A 11/15/2014   Procedure: INTRA-AORTIC BALLOON PUMP INSERTION;  Surgeon: Lennette Bihari, MD;  Location: [facility] CATH LAB;  Service: Cardiovascular;  Laterality: N/A;  . LEFT HEART CATHETERIZATION WITH CORONARY ANGIOGRAM N/A 11/14/2014   Procedure: LEFT HEART CATHETERIZATION WITH CORONARY ANGIOGRAM;  Surgeon: Lennette Bihari, MD;  Location: [facility] CATH LAB;  Service: Cardiovascular;  Laterality: N/A;  . PERCUTANEOUS CORONARY STENT INTERVENTION (PCI-S) N/A 11/15/2014   Procedure: PERCUTANEOUS CORONARY STENT INTERVENTION (PCI-S);  Surgeon:  Lennette Bihari, MD;  Location: [facility] CATH LAB;  Service: Cardiovascular;  Laterality: N/A;  . TEE WITHOUT CARDIOVERSION N/A 11/15/2014   Procedure: TRANSESOPHAGEAL ECHOCARDIOGRAM (TEE);  Surgeon: Loreli Slot, MD;  Location: [facility] OR;  Service: Open Heart Surgery;  Laterality: N/A;     Current Outpatient Prescriptions  Medication Sig Dispense Refill  . aspirin EC 81 MG tablet Take 1 tablet (81 mg total) by mouth daily. 90 tablet 3  . lisinopril (PRINIVIL,ZESTRIL) 10 MG tablet Take 1 tablet (10 mg total) by mouth daily. 30 tablet 1  . metFORMIN (GLUCOPHAGE) 500 MG tablet Take 500 mg by mouth 2 (two) times daily with a meal.    . metoprolol tartrate (LOPRESSOR) 25 MG tablet Take 1 tablet (25 mg total) by mouth 2 (two) times daily. 60 tablet 3  . oxyCODONE-acetaminophen (ROXICET) 5-325 MG tablet Take 1-2 tablets by mouth every 6 (six) hours as needed. 15 tablet 0  . rosuvastatin (CRESTOR) 20 MG tablet Take 1 tablet (20 mg total) by mouth daily. 90 tablet 3  . tamsulosin (FLOMAX) 0.4 MG CAPS capsule Take 1 capsule (0.4 mg total) by mouth  daily. 30 capsule 0  . traMADol (ULTRAM) 50 MG tablet Take 1 tablet (50 mg total) by mouth every 6 (six) hours as needed. 20 tablet 0   No current facility-administered medications for this visit.     Allergies:   Coconut oil    Social History:  The patient  reports that he has never smoked. He has never used smokeless tobacco. He reports that he does not drink alcohol or use drugs.   Family History:  The patient's family history includes Heart attack in his father; Heart disease in his mother; Nephrolithiasis in his mother; Stroke in his father and mother.    ROS:  Please see the history of present illness.    Review of Systems: Constitutional:  denies fever, chills, diaphoresis, appetite change and fatigue.  HEENT: denies photophobia, eye pain, redness, hearing loss, ear pain, congestion, sore throat, rhinorrhea, sneezing, neck pain, neck stiffness  and tinnitus.  Respiratory: denies SOB, DOE, cough, chest tightness, and wheezing.  Cardiovascular: denies chest pain, palpitations and leg swelling.  Gastrointestinal: denies nausea, vomiting, abdominal pain, diarrhea, constipation, blood in stool.  Genitourinary: denies dysuria, urgency, frequency, hematuria, flank pain and difficulty urinating.  Musculoskeletal: admits to  myalgias, back pain / significant right leg pain   Skin: denies pallor, rash and wound.  Neurological: denies dizziness, seizures, syncope, weakness, light-headedness, numbness and headaches.   Hematological: denies adenopathy, easy bruising, personal or family bleeding history.  Psychiatric/ Behavioral: denies suicidal ideation, mood changes, confusion, nervousness, sleep disturbance and agitation.       All other systems are reviewed and negative.    PHYSICAL EXAM: VS:  BP 90/66 (BP Location: Left Arm, Patient Position: Sitting, Cuff Size: Large)   Pulse 68   Ht 5\' 11"  (1.803 m)   Wt 262 lb 1.9 oz (118.9 kg)   BMI 36.56 kg/m  , BMI Body mass index is 36.56 kg/m. GEN: Well nourished, well developed, in no acute distress  HEENT: normal  Neck: no JVD, carotid bruits, or masses Cardiac: RRR; no murmurs, rubs, or gallops,no edema . His sternotomy scar is healing well.  The thoracostomy sites are healing well. Respiratory:  clear to auscultation bilaterally, normal work of breathing.  GI: soft, nontender, nondistended, + BS MS: no deformity or atrophy The saphenous vein harvest sites are healing well. Skin: warm and dry, no rash Neuro:  Strength and sensation are intact Psych: normal   EKG:  EKG is not  ordered today.     Recent Labs: 04/26/2016: ALT 42; BUN 18; Creatinine, Ser 1.38; Hemoglobin 16.3; Platelets 155; Potassium 4.0; Sodium 138    Lipid Panel    Component Value Date/Time   CHOL 121 (L) 01/26/2016 0807   TRIG 160 (H) 01/26/2016 0807   HDL 25 (L) 01/26/2016 0807   CHOLHDL 4.8 01/26/2016  0807   VLDL 32 (H) 01/26/2016 0807   LDLCALC 64 01/26/2016 0807      Wt Readings from Last 3 Encounters:  05/07/16 262 lb 1.9 oz (118.9 kg)  05/01/16 264 lb (119.7 kg)  04/26/16 266 lb (120.7 kg)      Other studies Reviewed: Additional studies/ records that were reviewed today include: . Review of the above records demonstrates:    ASSESSMENT AND PLAN:  1.  Coronary artery disease: He status post coronary artery bypass grafting. -LIMA to LAD -SVG to DIAGONAL 1 -SVG to OM1  He's not having any episodes of angina. We will continue the current dose of aspirin. I anticipate decreasing his  aspirin to 81 mg a day .  I'll see him again in 6 months.   He's been having some episodes of chest pain and elbow pain. Some the symptoms are similar to his symptoms prior to his heart attack. I offered to do a stress test that he was to wait and see if he feels better. I think that a lot of his symptoms are due to his blood pressure fluctuations from the fact that he had missed almost 2 weeks of medications.  I would have a low threshold to order a stress Myoview study if he does not feel better very soon.   2. Chronic systolic congestive heart failure: The patient had an acute anterior wall myocardial infarction resulting in decreased LV function.   His blood pressure is a little low today. We will decrease the lisinopril to 5 mg a day.   3. Hyperlipidemia: Continue Crestor    4. Leg pain :  Has gradually improved.  Walking regularly .   5. Kidney Stone:   Seth Riddle needs to have lithotripsy. He is at low risk for his upcoming procedure   Current medicines are reviewed at length with the patient today.  The patient does not have concerns regarding medicines.  The following changes have been made:  no change  Labs/ tests ordered today include:   No orders of the defined types were placed in this encounter.   Disposition:   FU with me in 2-3 weeks    Kristeen MissPhilip Nahser, MD  05/07/2016  1:34 PM    [facility] [address], [place], [place]  [address] Phone: [phone]; Fax: [phone]

## 2016-05-10 NOTE — Telephone Encounter (Signed)
Pt states he wants to cancel right ESWL because he was not given a written quote for the cost of the procedure. His insurance benefits were explained to him as reported by Rose from [place] & explained that there would be charges from [facility] as well as the professional charges & that I was unable to give him an exact number. Per Kimmie Q. From the pre-service center pt would not allow her to give financial information before he stated he wants to cancel the procedure. Advised pt that per Dr Apolinar Junes she would like for him to follow up with her in the office. Pt refused follow up appt against medical advice.

## 2016-05-13 ENCOUNTER — Inpatient Hospital Stay: Admission: RE | Admit: 2016-05-13 | Payer: BC Managed Care – PPO | Source: Ambulatory Visit

## 2016-05-16 ENCOUNTER — Ambulatory Visit: Admit: 2016-05-16 | Payer: BC Managed Care – PPO | Admitting: Urology

## 2016-05-16 SURGERY — LITHOTRIPSY, ESWL
Anesthesia: Moderate Sedation | Laterality: Right

## 2016-05-20 ENCOUNTER — Ambulatory Visit (INDEPENDENT_AMBULATORY_CARE_PROVIDER_SITE_OTHER): Payer: BC Managed Care – PPO | Admitting: Cardiovascular Disease

## 2016-05-20 ENCOUNTER — Encounter: Payer: Self-pay | Admitting: Cardiovascular Disease

## 2016-05-20 VITALS — BP 96/68 | HR 67 | Ht 71.0 in | Wt 260.6 lb

## 2016-05-20 DIAGNOSIS — I10 Essential (primary) hypertension: Secondary | ICD-10-CM

## 2016-05-20 DIAGNOSIS — I5022 Chronic systolic (congestive) heart failure: Secondary | ICD-10-CM

## 2016-05-20 DIAGNOSIS — I2510 Atherosclerotic heart disease of native coronary artery without angina pectoris: Secondary | ICD-10-CM

## 2016-05-20 LAB — BASIC METABOLIC PANEL
BUN: 18 mg/dL (ref 7–25)
CHLORIDE: 103 mmol/L (ref 98–110)
CO2: 28 mmol/L (ref 20–31)
Calcium: 9.8 mg/dL (ref 8.6–10.3)
Creat: 1.12 mg/dL (ref 0.70–1.33)
GLUCOSE: 122 mg/dL — AB (ref 65–99)
POTASSIUM: 4.5 mmol/L (ref 3.5–5.3)
SODIUM: 139 mmol/L (ref 135–146)

## 2016-05-20 NOTE — Progress Notes (Signed)
Cardiology Office Note   Date:  05/20/2016   ID:  Seth Riddle, DOB 1960-09-03, MRN [id number]  PCP:  Marcello Fennel, MD  Cardiologist:   Mertie Moores, MD   Chief Complaint  Patient presents with  . Coronary Artery Disease      History of Present Illness: Seth Riddle is a 55 y.o. male who presents for follow-up for his recent hospitalization for coronary artery disease, myocardial infarction and subsequent coronary artery bypass grafting. His admitted with symptoms of unstable angina. Cardiac catheterization the following day revealed significant three-vessel coronary artery disease.  The left main coronary artery was angiographically normal and bifurcated into the LAD and left circumflex coronary artery.  he LAD was a large caliber vessel and immmediately gave rise to a very high diagonal (ramus intermediate like vessel). There was diffuse 95% stenosis in the LAD in the region of this diagonal takeoff with 90-95% stenosis diffusely just beyond the origin of this ramus like vessel and 90% diffuse stenosis at the origin of the ramus like high diagonal vessel almost immediately after the left main. The left circumflex coronary artery was a moderate size vessel that gave rise to one major marginal branch. There was 95% focal stenosis just beyond an ectatic segment in the proximal obtuse marginal branch and there was diffuse distal 90% marginal stenosis. The RCA was angiographically normal , dominant vessel that gave rise to a large PDA and PLA vessel.  Left ventriculography revealed normal global LV contractility without focal segmental wall motion abnormalities. There was no evidence for mitral regurgitation. Ejection fraction is 55-60%.    Unfortunately, the night after his cardiac catheterization he occluded his left anterior descending artery and had ongoing chest pain. He returned to the Cath Lab the following morning an emergency balloon angioplasty  was performed to  open his LAD.    He was taken to emergent coronary artery bypass grafting at that time. Had a relatively slow recovery but has done fairly well. Echo done prior to DC revealed mild LV dysfunction:  Left ventricle: The cavity size was normal. Wall thickness was increased in a pattern of mild LVH. Systolic function was mildly reduced. The estimated ejection fraction was in the range of 45% to 50%. Diffuse hypokinesis. Doppler parameters are consistent with abnormal left ventricular relaxation (grade 1 diastolic dysfunction). - Right ventricle: The cavity size was mildly dilated. - Right atrium: The atrium was mildly dilated.  Impressions:  - Mild global reduction in LV function (EF 50); grade 1 diastolic dysfunction; mild RAE/RVE.    CABG  Left internal mammary artery to left anterior descending Saphenous vein graft to first diagonal Saphenous vein graft to first obtuse marginal   He has had significant right leg pain since the initial cath. He's had back surgery and the pain feels like a slipped disc.  Jan 05, 2015: Seth Riddle is doing well from a cardiac standpoint. He continues to have severe leg pain. He apparently had some injury that occurred following the initial cardiac catheterization prior to his bypass surgery. His echo from this week shows improved LV systolic function.  - Left ventricle: The cavity size was normal. Wall thickness was increased in a pattern of mild LVH. Systolic function was mildly to moderately reduced. The estimated ejection fraction was in the range of 40% to 45%. Akinesis of the apical myocardium. Moderate hypokinesis of the apicalanterior myocardium.  Sept. 8, 2016:  doing ok. Still has low stamina  Walking some.  Had been  running on the treadmill at cardiac rehab.   October 25, 2015: Doing just ok still walking .   Prior to his MI, he could work all day, spend time with family and still work 3 hours later in the  night.  His stamina has gone.    Is aware of his hear beat.    Feels mildly exhausted all day , every day .     No CP or angina   Sept. 19, 2017  Seth Riddle is seen today for eval and management of his elevated BP  Hx of CAD with emergent CABG  Has been feeling poorly for the past week or so. Has had a headache ( 8/10)  BP has been high  He had run out of his BP for 10 days. Had his meds refilled this weekend.   No orthostasis Has not been sleeping well - anxious about issues and isn't sleeping well. Has been depressed.  Has been walking regularly  - 2 miles 3 days a week.  , walks sporadically  No angina pain  No CP  Had some left arm tingling in his left arm last night  Oct. 2, 2017:  Seth Riddle is seen today for follow-up of his coronary artery disease, coronary artery bypass grafting, and hypertension. He had run out of his meds prior to his last visit . BP has been low HA has resolved. The anxiousness has resolved as well.    Past Medical History:  Diagnosis Date  . Coronary artery disease   . Diabetes mellitus without complication ([place])   . Dyslipidemia (high LDL; low HDL)   . Family history of premature CAD   . Low HDL (under 40)   . MI (myocardial infarction)     Past Surgical History:  Procedure Laterality Date  . CORONARY ARTERY BYPASS GRAFT N/A 11/15/2014   Procedure: CORONARY ARTERY BYPASS GRAFTING (CABG), ON PUMP, TIMES THREE, USING LEFT INTERNAL MAMMARY ARTERY, RIGHT GREATER SAPHENOUS VEIN HARVESTED ENDOSCOPICALLY;  Surgeon: Melrose Nakayama, MD;  Location: [place];  Service: Open Heart Surgery;  Laterality: N/A;  . INTRA-AORTIC BALLOON PUMP INSERTION N/A 11/15/2014   Procedure: INTRA-AORTIC BALLOON PUMP INSERTION;  Surgeon: Troy Sine, MD;  Location: [facility] CATH LAB;  Service: Cardiovascular;  Laterality: N/A;  . LEFT HEART CATHETERIZATION WITH CORONARY ANGIOGRAM N/A 11/14/2014   Procedure: LEFT HEART CATHETERIZATION WITH CORONARY ANGIOGRAM;  Surgeon: Troy Sine, MD;  [facility] LAB;  Service: Cardiovascular;  Laterality: N/A;  . PERCUTANEOUS CORONARY STENT INTERVENTION (PCI-S) N/A 11/15/2014   Procedure: PERCUTANEOUS CORONARY STENT INTERVENTION (PCI-S);  Surgeon: Troy Sine, MD;  Location: [facility] CATH LAB;  Service: Cardiovascular;  Laterality: N/A;  . TEE WITHOUT CARDIOVERSION N/A 11/15/2014   Procedure: TRANSESOPHAGEAL ECHOCARDIOGRAM (TEE);  Surgeon: Melrose Nakayama, MD;  Location: [place];  Service: Open Heart Surgery;  Laterality: N/A;     Current Outpatient Prescriptions  Medication Sig Dispense Refill  . aspirin EC 81 MG tablet Take 1 tablet (81 mg total) by mouth daily. 90 tablet 3  . lisinopril (PRINIVIL,ZESTRIL) 5 MG tablet Take 1 tablet (5 mg total) by mouth daily. 30 tablet 11  . metFORMIN (GLUCOPHAGE) 500 MG tablet Take 500 mg by mouth 2 (two) times daily with a meal.    . metoprolol tartrate (LOPRESSOR) 25 MG tablet Take 1 tablet (25 mg total) by mouth 2 (two) times daily. 60 tablet 3  . oxyCODONE-acetaminophen (PERCOCET/ROXICET) 5-325 MG tablet Take 1-2 tablets by mouth every 6 (six) hours  as needed for pain.    . rosuvastatin (CRESTOR) 20 MG tablet Take 1 tablet (20 mg total) by mouth daily. 90 tablet 3  . tamsulosin (FLOMAX) 0.4 MG CAPS capsule Take 1 capsule (0.4 mg total) by mouth daily. 30 capsule 0  . traMADol (ULTRAM) 50 MG tablet Take 50 mg by mouth every 6 (six) hours as needed for pain.     No current facility-administered medications for this visit.     Allergies:   Coconut oil    Social History:  The patient  reports that he has never smoked. He has never used smokeless tobacco. He reports that he does not drink alcohol or use drugs.   Family History:  The patient's family history includes Heart attack in his father; Heart disease in his mother; Nephrolithiasis in his mother; Stroke in his father and mother.    ROS:  Please see the history of present illness.    Review of Systems: Constitutional:   denies fever, chills, diaphoresis, appetite change and fatigue.  HEENT: denies photophobia, eye pain, redness, hearing loss, ear pain, congestion, sore throat, rhinorrhea, sneezing, neck pain, neck stiffness and tinnitus.  Respiratory: denies SOB, DOE, cough, chest tightness, and wheezing.  Cardiovascular: denies chest pain, palpitations and leg swelling.  Gastrointestinal: denies nausea, vomiting, abdominal pain, diarrhea, constipation, blood in stool.  Genitourinary: denies dysuria, urgency, frequency, hematuria, flank pain and difficulty urinating.  Musculoskeletal: admits to  myalgias, back pain / significant right leg pain   Skin: denies pallor, rash and wound.  Neurological: denies dizziness, seizures, syncope, weakness, light-headedness, numbness and headaches.   Hematological: denies adenopathy, easy bruising, personal or family bleeding history.  Psychiatric/ Behavioral: denies suicidal ideation, mood changes, confusion, nervousness, sleep disturbance and agitation.       All other systems are reviewed and negative.    PHYSICAL EXAM: VS:  BP 96/68   Pulse 67   Ht _0  (1.803 m)   Wt 260 lb 9.6 oz (118.2 kg)   BMI 36.35 kg/m  , BMI Body mass index is 36.35 kg/m. GEN: Well nourished, well developed, in no acute distress  HEENT: normal  Neck: no JVD, carotid bruits, or masses Cardiac: RRR; no murmurs, rubs, or gallops,no edema . His sternotomy scar is healing well.  The thoracostomy sites are healing well. Respiratory:  clear to auscultation bilaterally, normal work of breathing.  GI: soft, nontender, nondistended, + BS MS: no deformity or atrophy The saphenous vein harvest sites are healing well. Skin: warm and dry, no rash Neuro:  Strength and sensation are intact Psych: normal   EKG:  EKG is not  ordered today.     Recent Labs: 04/26/2016: ALT 42; BUN 18; Creatinine, Ser 1.38; Hemoglobin 16.3; Platelets 155; Potassium 4.0; Sodium 138    Lipid Panel      Component Value Date/Time   CHOL 121 (L) 01/26/2016 0807   TRIG 160 (H) 01/26/2016 0807   HDL 25 (L) 01/26/2016 0807   CHOLHDL 4.8 01/26/2016 0807   VLDL 32 (H) 01/26/2016 0807   LDLCALC 64 01/26/2016 0807      Wt Readings from Last 3 Encounters:  05/20/16 260 lb 9.6 oz (118.2 kg)  05/07/16 262 lb 1.9 oz (118.9 kg)  05/01/16 264 lb (119.7 kg)      Other studies Reviewed: Additional studies/ records that were reviewed today include: . Review of the above records demonstrates:    ASSESSMENT AND PLAN:  1.  Coronary artery disease: He status post coronary artery  bypass grafting. -LIMA to LAD -SVG to DIAGONAL 1 -SVG to OM1  He's not having any episodes of angina. We will continue the current dose of aspirin. I anticipate decreasing his aspirin to 81 mg a day .  I'll see him again in 6 months.   He's been having some episodes of chest pain and elbow pain. Some the symptoms are similar to his symptoms prior to his heart attack. I offered to do a stress test that he was to wait and see if he feels better. I think that a lot of his symptoms are due to his blood pressure fluctuations from the fact that he had missed almost 2 weeks of medications.  I would have a low threshold to order a stress Myoview study if he does not feel better very soon.   2. Chronic systolic congestive heart failure: The patient had an acute anterior wall myocardial infarction resulting in decreased LV function.   His blood pressure is a little low today. We will decrease the lisinopril to 5 mg a day.Burnis Medin check a basic medical profile today. I'll see me in 6 month.   3. Hyperlipidemia: Continue Crestor    4. Leg pain :  Has gradually improved.  Walking regularly .    Current medicines are reviewed at length with the patient today.  The patient does not have concerns regarding medicines.  The following changes have been made:  no change  Labs/ tests ordered today include:   Orders Placed This  Encounter  Procedures  . Basic Metabolic Panel (BMET)  . Comp Met (CMET)  . Lipid Profile    Disposition:   FU with me in 6 months     Mertie Moores, MD  05/20/2016 11:37 AM    [facility] [address], [place], [place]  [address] Phone: [phone]; Fax: [phone]

## 2016-05-20 NOTE — Patient Instructions (Signed)
Medication Instructions:  Your physician recommends that you continue on your current medications as directed. Please refer to the Current Medication list given to you today.   Labwork: TODAY - basic metabolic panel  Your physician recommends that you return for lab work in: 6 months on the day of or a few days before your office visit with Dr. Nahser.  You will need to FAST for this appointment - nothing to eat or drink after midnight the night before except water.   Testing/Procedures: None Ordered   Follow-Up: Your physician wants you to follow-up in: 6 months with Dr. Nahser.  You will receive a reminder letter in the mail two months in advance. If you don't receive a letter, please call our office to schedule the follow-up appointment.   If you need a refill on your cardiac medications before your next appointment, please call your pharmacy.   Thank you for choosing [facility]! Michelle Swinyer, RN [phone]    

## 2016-06-03 ENCOUNTER — Other Ambulatory Visit: Payer: Self-pay | Admitting: *Deleted

## 2016-06-03 MED ORDER — METOPROLOL TARTRATE 25 MG PO TABS
25.0000 mg | ORAL_TABLET | Freq: Two times a day (BID) | ORAL | 1 refills | Status: DC
Start: 2016-06-03 — End: 2018-03-10

## 2016-07-10 ENCOUNTER — Encounter (HOSPITAL_BASED_OUTPATIENT_CLINIC_OR_DEPARTMENT_OTHER): Payer: BC Managed Care – PPO

## 2016-08-08 ENCOUNTER — Telehealth: Payer: Self-pay | Admitting: Cardiovascular Disease

## 2016-08-08 NOTE — Telephone Encounter (Signed)
Spoke with patient who called to ask if he can go scuba diving at 40 ft or less.  He is s/p CABG 3/16.  I advised him I will review with Dr. Elease Hashimoto and call him back with his advice.  He verbalized understanding and agreement.

## 2016-08-08 NOTE — Telephone Encounter (Signed)
He may go scuba diving .

## 2016-08-08 NOTE — Telephone Encounter (Signed)
Advised patient that per Dr. Elease Hashimoto he may go scuba diving.  He verbalized understanding and agreement and thanked me for the call.

## 2016-08-08 NOTE — Telephone Encounter (Signed)
[employer] is calling because he has a question about some different activities in which he wants to do. Please call

## 2016-08-27 ENCOUNTER — Ambulatory Visit (HOSPITAL_BASED_OUTPATIENT_CLINIC_OR_DEPARTMENT_OTHER): Payer: BC Managed Care – PPO | Attending: Cardiovascular Disease | Admitting: Cardiology

## 2016-08-27 VITALS — Ht 71.0 in | Wt 250.0 lb

## 2016-08-27 DIAGNOSIS — G4734 Idiopathic sleep related nonobstructive alveolar hypoventilation: Secondary | ICD-10-CM | POA: Diagnosis not present

## 2016-08-27 DIAGNOSIS — R0683 Snoring: Secondary | ICD-10-CM | POA: Diagnosis not present

## 2016-08-27 DIAGNOSIS — G4736 Sleep related hypoventilation in conditions classified elsewhere: Secondary | ICD-10-CM | POA: Diagnosis not present

## 2016-08-27 DIAGNOSIS — R400 Somnolence: Secondary | ICD-10-CM | POA: Diagnosis not present

## 2016-08-29 ENCOUNTER — Telehealth: Payer: Self-pay | Admitting: *Deleted

## 2016-08-29 ENCOUNTER — Other Ambulatory Visit: Payer: Self-pay | Admitting: *Deleted

## 2016-08-29 DIAGNOSIS — G4734 Idiopathic sleep related nonobstructive alveolar hypoventilation: Secondary | ICD-10-CM

## 2016-08-29 NOTE — Telephone Encounter (Signed)
-----   Message from Quintella Reichert, MD sent at 08/29/2016  5:07 PM EST ----- Please let patient know that sleep study showed no significant sleep apnea but did have borderline low oxygen at night.  Please set up an overnight pulse oximetry at home

## 2016-08-29 NOTE — Procedures (Signed)
   Patient Name: Seth Riddle, Seth Riddle Date: 08/27/2016 Gender: Male D.O.B: [birth year] Age (years): 55 Referring Provider: Kristeen Miss Height (inches): 71 Interpreting Physician: Armanda Magic MD, ABSM Weight (lbs): 250 RPSGT: Wylie Hail BMI: 35 MRN: [id number] Neck Size: 17.50  CLINICAL INFORMATION Sleep Study Type: NPSG  Indication for sleep study: N/A  Epworth Sleepiness Score: 1  SLEEP STUDY TECHNIQUE As per the AASM Manual for the Scoring of Sleep and Associated Events v2.3 (April 2016) with a hypopnea requiring 4% desaturations.  The channels recorded and monitored were frontal, central and occipital EEG, electrooculogram (EOG), submentalis EMG (chin), nasal and oral airflow, thoracic and abdominal wall motion, anterior tibialis EMG, snore microphone, electrocardiogram, and pulse oximetry.  MEDICATIONS Medications self-administered by patient taken the night of the study : N/A  SLEEP ARCHITECTURE The study was initiated at 10:27:35 PM and ended at 4:31:01 AM.  Sleep onset time was 119.4 minutes and the sleep efficiency was 64.9%. The total sleep time was 236.0 minutes.  Stage REM latency was 63.0 minutes.  The patient spent 8.05% of the night in stage N1 sleep, 72.46% in stage N2 sleep, 0.00% in stage N3 and 19.49% in REM.  Alpha intrusion was absent.  Supine sleep was 2.97%.  RESPIRATORY PARAMETERS The overall apnea/hypopnea index (AHI) was 0.5 per hour. There were 0 total apneas, including 0 obstructive, 0 central and 0 mixed apneas. There were 2 hypopneas and 5 RERAs. The AHI during Stage REM sleep was 0.0 per hour. AHI while supine was 8.6 per hour. The mean oxygen saturation was 90.32%. The minimum SpO2 during sleep was 85.00%. Loud snoring was noted during this study.  CARDIAC DATA The 2 lead EKG demonstrated sinus rhythm. The mean heart rate was 70.98 beats per minute. Other EKG findings include: None.  LEG MOVEMENT DATA The total PLMS were 0  with a resulting PLMS index of 0.00. Associated arousal with leg movement index was 0.0 .  IMPRESSIONS - No significant obstructive sleep apnea occurred during this study (AHI = 0.5/h). - No significant central sleep apnea occurred during this study (CAI = 0.0/h). - Mild oxygen desaturation was noted during this study (Min O2 = 85.00%). - The patient snored with Loud snoring volume. - No cardiac abnormalities were noted during this study. - Clinically significant periodic limb movements did not occur during sleep. No significant associated arousals.  DIAGNOSIS - Nocturnal Hypoxemia (327.26 [G47.36 ICD-10])  RECOMMENDATIONS - Avoid alcohol, sedatives and other CNS depressants that may worsen sleep apnea and disrupt normal sleep architecture. - Sleep hygiene should be reviewed to assess factors that may improve sleep quality. - Weight management and regular exercise should be initiated or continued if appropriate. - The patient had minimal nocturnal hypoxemia with oxygen saturations < 88% for 5.9 minutes.  Recommend overnight home pulse oximetry to evaluate further  Armanda Magic Diplomate, American Board of Sleep Medicine  ELECTRONICALLY SIGNED ON:  08/29/2016, 5:03 PM [facility] PH: [phone]   FX: [phone] ACCREDITED BY THE AMERICAN ACADEMY OF SLEEP MEDICINE

## 2016-11-07 ENCOUNTER — Other Ambulatory Visit: Payer: Self-pay | Admitting: Cardiovascular Disease

## 2017-01-03 ENCOUNTER — Ambulatory Visit
Admission: RE | Admit: 2017-01-03 | Discharge: 2017-01-03 | Disposition: A | Discharge status: Home / Self Care | Payer: BC Managed Care – PPO | Source: Ambulatory Visit | Attending: Urology | Admitting: Urology

## 2017-01-03 ENCOUNTER — Telehealth: Payer: Self-pay

## 2017-01-03 ENCOUNTER — Ambulatory Visit (INDEPENDENT_AMBULATORY_CARE_PROVIDER_SITE_OTHER): Payer: BC Managed Care – PPO | Admitting: Urology

## 2017-01-03 VITALS — BP 153/98 | HR 59 | Ht 71.0 in | Wt 265.0 lb

## 2017-01-03 DIAGNOSIS — Z87442 Personal history of urinary calculi: Secondary | ICD-10-CM

## 2017-01-03 DIAGNOSIS — R110 Nausea: Secondary | ICD-10-CM

## 2017-01-03 DIAGNOSIS — R109 Unspecified abdominal pain: Secondary | ICD-10-CM

## 2017-01-03 DIAGNOSIS — N200 Calculus of kidney: Secondary | ICD-10-CM

## 2017-01-03 DIAGNOSIS — N281 Cyst of kidney, acquired: Secondary | ICD-10-CM | POA: Diagnosis not present

## 2017-01-03 DIAGNOSIS — R10A2 Flank pain, left side: Secondary | ICD-10-CM

## 2017-01-03 LAB — URINALYSIS, COMPLETE
Bilirubin, UA: NEGATIVE
GLUCOSE, UA: NEGATIVE
KETONES UA: NEGATIVE
Nitrite, UA: NEGATIVE
SPEC GRAV UA: 1.025 (ref 1.005–1.030)
Urobilinogen, Ur: 0.2 mg/dL (ref 0.2–1.0)
pH, UA: 5 (ref 5.0–7.5)

## 2017-01-03 LAB — MICROSCOPIC EXAMINATION

## 2017-01-03 MED ORDER — OXYCODONE-ACETAMINOPHEN 5-325 MG PO TABS: 1.0000 | ORAL_TABLET | ORAL | 0 refills | Status: DC | PRN

## 2017-01-03 MED ORDER — ONDANSETRON HCL 4 MG PO TABS: 4.0000 mg | ORAL_TABLET | Freq: Three times a day (TID) | ORAL | 0 refills | Status: DC | PRN

## 2017-01-03 MED ORDER — KETOROLAC TROMETHAMINE 60 MG/2ML IM SOLN
60.0000 mg | Freq: Once | INTRAMUSCULAR | Status: AC
  Administered 2017-01-03: 30 mg via INTRAMUSCULAR

## 2017-01-03 MED ORDER — TAMSULOSIN HCL 0.4 MG PO CAPS: 0.4000 mg | ORAL_CAPSULE | Freq: Every day | ORAL | 0 refills | Status: DC

## 2017-01-03 NOTE — Progress Notes (Signed)
01/03/2017 3:31 PM   Seth Riddle June 16, 1961 [id number]  Referring provider: [name], MD [phone] [address] [facility] [place], [place] [address]  CC: flank pain  HPI: 56 year old male who called the office this morning complaining of severe left flank pain, nausea and vomiting which started at 7 am today.   He describes the pain as severe radiating from his left lower quadrant into his left scrotum.  He was seen and evaluated last year in 04/2016 for a 7 mm right UPJ stone with moderate hydronephrosis. ESWL was booked that he ultimately elected to cancel this procedure due to concern for cost. He was counseled to return for follow-up and also declined a follow-up AGAINST MEDICAL ADVICE.  On CT scan in 04/2016, he also has an approximate 6 mm right nonobstructing lower pole stone. There is also a 5.8 cm right lower pole renal cyst which is not fully characterize a noncontrast CT scan.    He denies any fevers or chills.  No difficulties urinating. No gross hematuria or dysuria.  History of CAD s/p CABG on ASA.   PMH: Past Medical History:  Diagnosis Date  . Coronary artery disease   . Diabetes mellitus without complication (HCC)   . Dyslipidemia (high LDL; low HDL)   . Family history of premature CAD   . Low HDL (under 40)   . MI (myocardial infarction)     Surgical History: Past Surgical History:  Procedure Laterality Date  . CORONARY ARTERY BYPASS GRAFT N/A 11/15/2014   Procedure: CORONARY ARTERY BYPASS GRAFTING (CABG), ON PUMP, TIMES THREE, USING LEFT INTERNAL MAMMARY ARTERY, RIGHT GREATER SAPHENOUS VEIN HARVESTED ENDOSCOPICALLY;  Surgeon: Loreli Slot, MD;  Location: [facility] OR;  Service: Open Heart Surgery;  Laterality: N/A;  . INTRA-AORTIC BALLOON PUMP INSERTION N/A 11/15/2014   Procedure: INTRA-AORTIC BALLOON PUMP INSERTION;  Surgeon: Lennette Bihari, MD;  Location: [facility] CATH LAB;  Service: Cardiovascular;  Laterality: N/A;    . LEFT HEART CATHETERIZATION WITH CORONARY ANGIOGRAM N/A 11/14/2014   Procedure: LEFT HEART CATHETERIZATION WITH CORONARY ANGIOGRAM;  Surgeon: Lennette Bihari, MD;  Location: [facility] CATH LAB;  Service: Cardiovascular;  Laterality: N/A;  . PERCUTANEOUS CORONARY STENT INTERVENTION (PCI-S) N/A 11/15/2014   Procedure: PERCUTANEOUS CORONARY STENT INTERVENTION (PCI-S);  Surgeon: Lennette Bihari, MD;  Location[facility] CATH LAB;  Service: Cardiovascular;  Laterality: N/A;  . TEE WITHOUT CARDIOVERSION N/A 11/15/2014   Procedure: TRANSESOPHAGEAL ECHOCARDIOGRAM (TEE);  Surgeon: Loreli Slot, MD;  Location: [facility] OR;  Service: Open Heart Surgery;  Laterality: N/A;    Home Medications:  Allergies as of 01/03/2017      Reactions   Coconut Oil Hives      Medication List       Accurate as of 01/03/17 11:59 PM. Always use your most recent med list.          aspirin EC 81 MG tablet Take 1 tablet (81 mg total) by mouth daily.   lisinopril 5 MG tablet Commonly known as:  PRINIVIL,ZESTRIL Take 1 tablet (5 mg total) by mouth daily.   metFORMIN 500 MG tablet Commonly known as:  GLUCOPHAGE Take 500 mg by mouth 2 (two) times daily with a meal.   metoprolol tartrate 25 MG tablet Commonly known as:  LOPRESSOR Take 1 tablet (25 mg total) by mouth 2 (two) times daily.   ondansetron 4 MG tablet Commonly known as:  ZOFRAN Take 1 tablet (4 mg total) by mouth every 8 (  eight) hours as needed for nausea or vomiting.   oxyCODONE-acetaminophen 5-325 MG tablet Commonly known as:  PERCOCET Take 1-2 tablets by mouth every 4 (four) hours as needed for moderate pain or severe pain.   rosuvastatin 20 MG tablet Commonly known as:  CRESTOR TAKE 1 TABLET BY MOUTH EVERY DAY   tamsulosin 0.4 MG Caps capsule Commonly known as:  FLOMAX Take 1 capsule (0.4 mg total) by mouth daily.   tamsulosin 0.4 MG Caps capsule Commonly known as:  FLOMAX Take 1 capsule (0.4 mg total) by mouth daily.   traMADol 50 MG  tablet Commonly known as:  ULTRAM Take 50 mg by mouth every 6 (six) hours as needed for pain.       Allergies:  Allergies  Allergen Reactions  . Coconut Oil Hives    Family History: Family History  Problem Relation Age of Onset  . Heart disease Mother   . Stroke Mother   . Nephrolithiasis Mother   . Heart attack Father   . Stroke Father   . Kidney cancer Neg Hx   . Bladder Cancer Neg Hx   . Prostate cancer Neg Hx     Social History:  reports that he has never smoked. He has never used smokeless tobacco. He reports that he does not drink alcohol or use drugs.  ROS: 12 point review of systems was performed and is negative other than as per history of present illness.  Physical Exam: BP (!) 153/98   Pulse (!) 59   Ht 5\' 11"  (1.803 m)   Wt 265 lb (120.2 kg)   BMI 36.96 kg/m   Constitutional:  Alert and oriented, Presents with wife today.  Mild stress caring emesis basin. HEENT: [place] AT, moist mucus membranes.  Trachea midline, no masses. Cardiovascular: No clubbing, cyanosis, or edema. Respiratory: Normal respiratory effort, no increased work of breathing. GI: Abdomen is soft, nontender, nondistended, no abdominal masses GU: No CVA tenderness.  Skin: No rashes, bruises or suspicious lesions. Neurologic: Grossly intact, no focal deficits, moving all 4 extremities. Psychiatric: Normal mood and affect.  Laboratory Data: Lab Results  Component Value Date   WBC 9.9 04/26/2016   HGB 16.3 04/26/2016   HCT 46.3 04/26/2016   MCV 88.4 04/26/2016   PLT 155 04/26/2016    Lab Results  Component Value Date   CREATININE 1.12 05/20/2016     Lab Results  Component Value Date   HGBA1C 6.6 (H) 11/13/2014    Urinalysis UA today shows greater than 30 red blood cells per high-powered field, 3+ dip. He also 6-10 white blood cells per high-powered field, few bacteria, nitrate negative. No crystals identified.  Pertinent Imaging: CLINICAL DATA:  Left flank  pain.  EXAM: ABDOMEN - 1 VIEW  COMPARISON:  CT scan of April 26, 2016.  FINDINGS: The bowel gas pattern is normal. No radio-opaque calculi or other significant radiographic abnormality are seen.  IMPRESSION: No evidence of bowel obstruction or ileus. No definite renal calculi are noted.   Electronically Signed   By: Lupita Raider, M.D.   On: 01/03/2017 11:49  KUB image was personally reviewed today. There is a small calcification overlying his left sacrum which may represent a punctate ureteral calcification.  Assessment & Plan:    1. Left flank pain Based on clinical presentation as well as presence of ectopic blood in urine, I suspect he is having acute stone episode KUB reviewed today, there is a punctate counts patient over the left sacrum which may  represent the stone Patient was administered IM Toradol in the office today for pain control which is helped significantly. Based on the size of the calcification, medical expulsive therapy was discussed as an optionis more comfortable.  He was given Flomax, urinary strainer, and strict return precautions Follow-up in 1-2 weeks with a renal ultrasound prior - Urinalysis, Complete - US Renal; Future - ketorolac (TORADOL) injection 60 mg; Inject 2 mLs (60 mg total) into the muscle once.  2. Nausea Improved with pain control, prescription for Zofran given  3. History of nephrolithiasis Recommend surveillance with renal ultrasound to ensure that he does not have pain and right ureteral calcification in addition to #1 and #4  4. Renal cyst, acquired, right Recommend follow-up with renal ultrasound after the stone has passed to further characterize this cyst as previously recommended  Return in about 2 weeks (around 01/17/2017), or renal ultrasound.    [name], MD  [facility]  [address] [place], [place] [address] [phone]

## 2017-01-03 NOTE — Telephone Encounter (Signed)
Pt wife called stating pt was seen for a kidney stone back in 04/2016 and had ESWL to treat stone. Wife stated that pt is currently having another "kidney stone attack" and wanted to know how to proceed. Per Dr. Apolinar Junes pt should go get a KUB and come to [facility] for OV at 11:30. Made wife aware. Orders placed and pt added to schedule.

## 2017-01-03 NOTE — Progress Notes (Signed)
IM Injection  Patient is present today for an IM Injection for treatment of kidney stone pain. Drug: ketorolac (Toradol)    Dose:30mg  Location:Right Upper Outer Quadrant Lot: [id number] Exp:02/2018 Patient tolerated well, no complications were noted  Preformed by: C.Rana Snare, CMA

## 2017-01-09 IMAGING — CR DG CHEST 2V
2 series · 2 of 2 positions shown · non-contrast
Comparison: Portable chest x-ray of 11/18/2014

CLINICAL DATA: Post CABG 2 weeks ago, cough

EXAM:
CHEST  2 VIEW

[w chest pa]
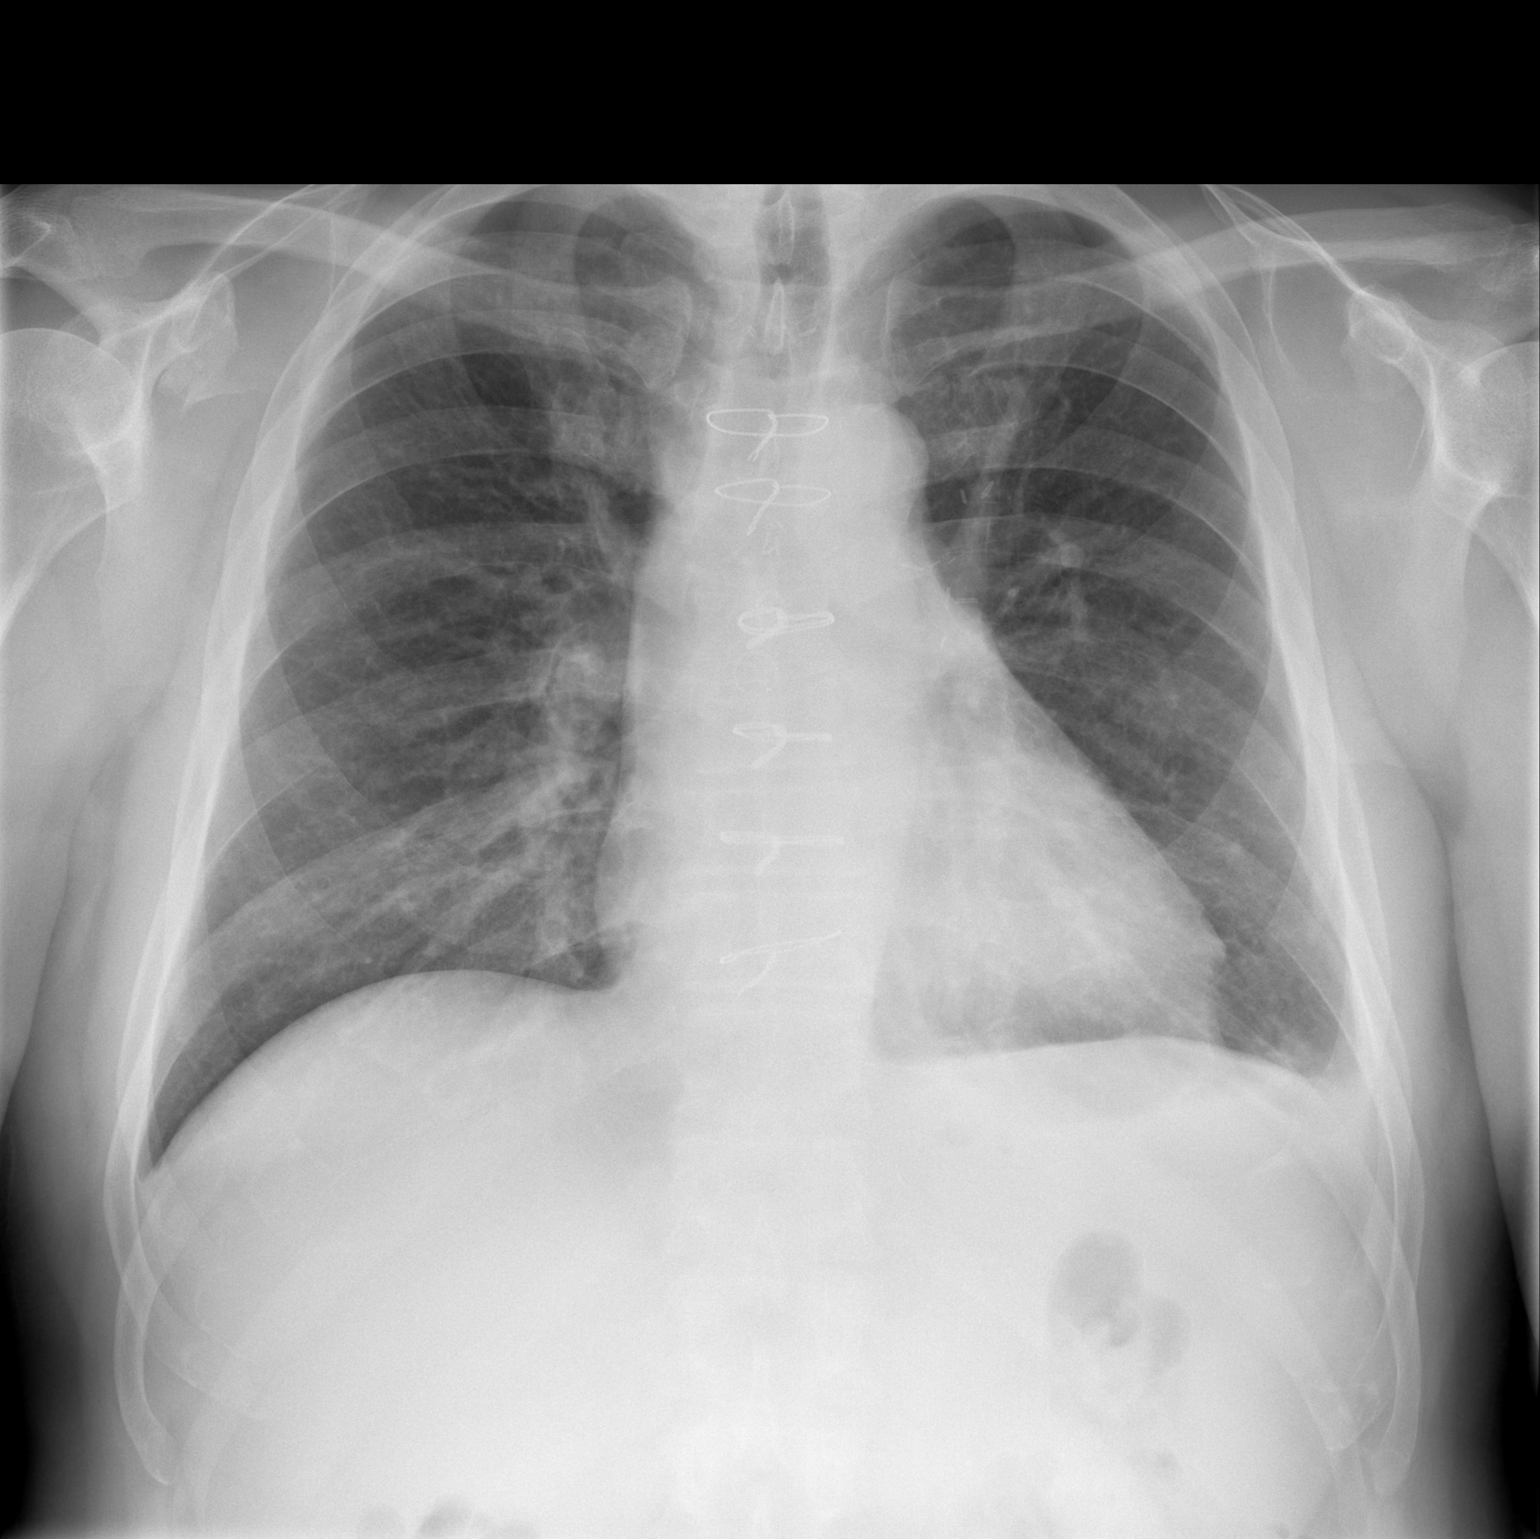

[w chest lat]
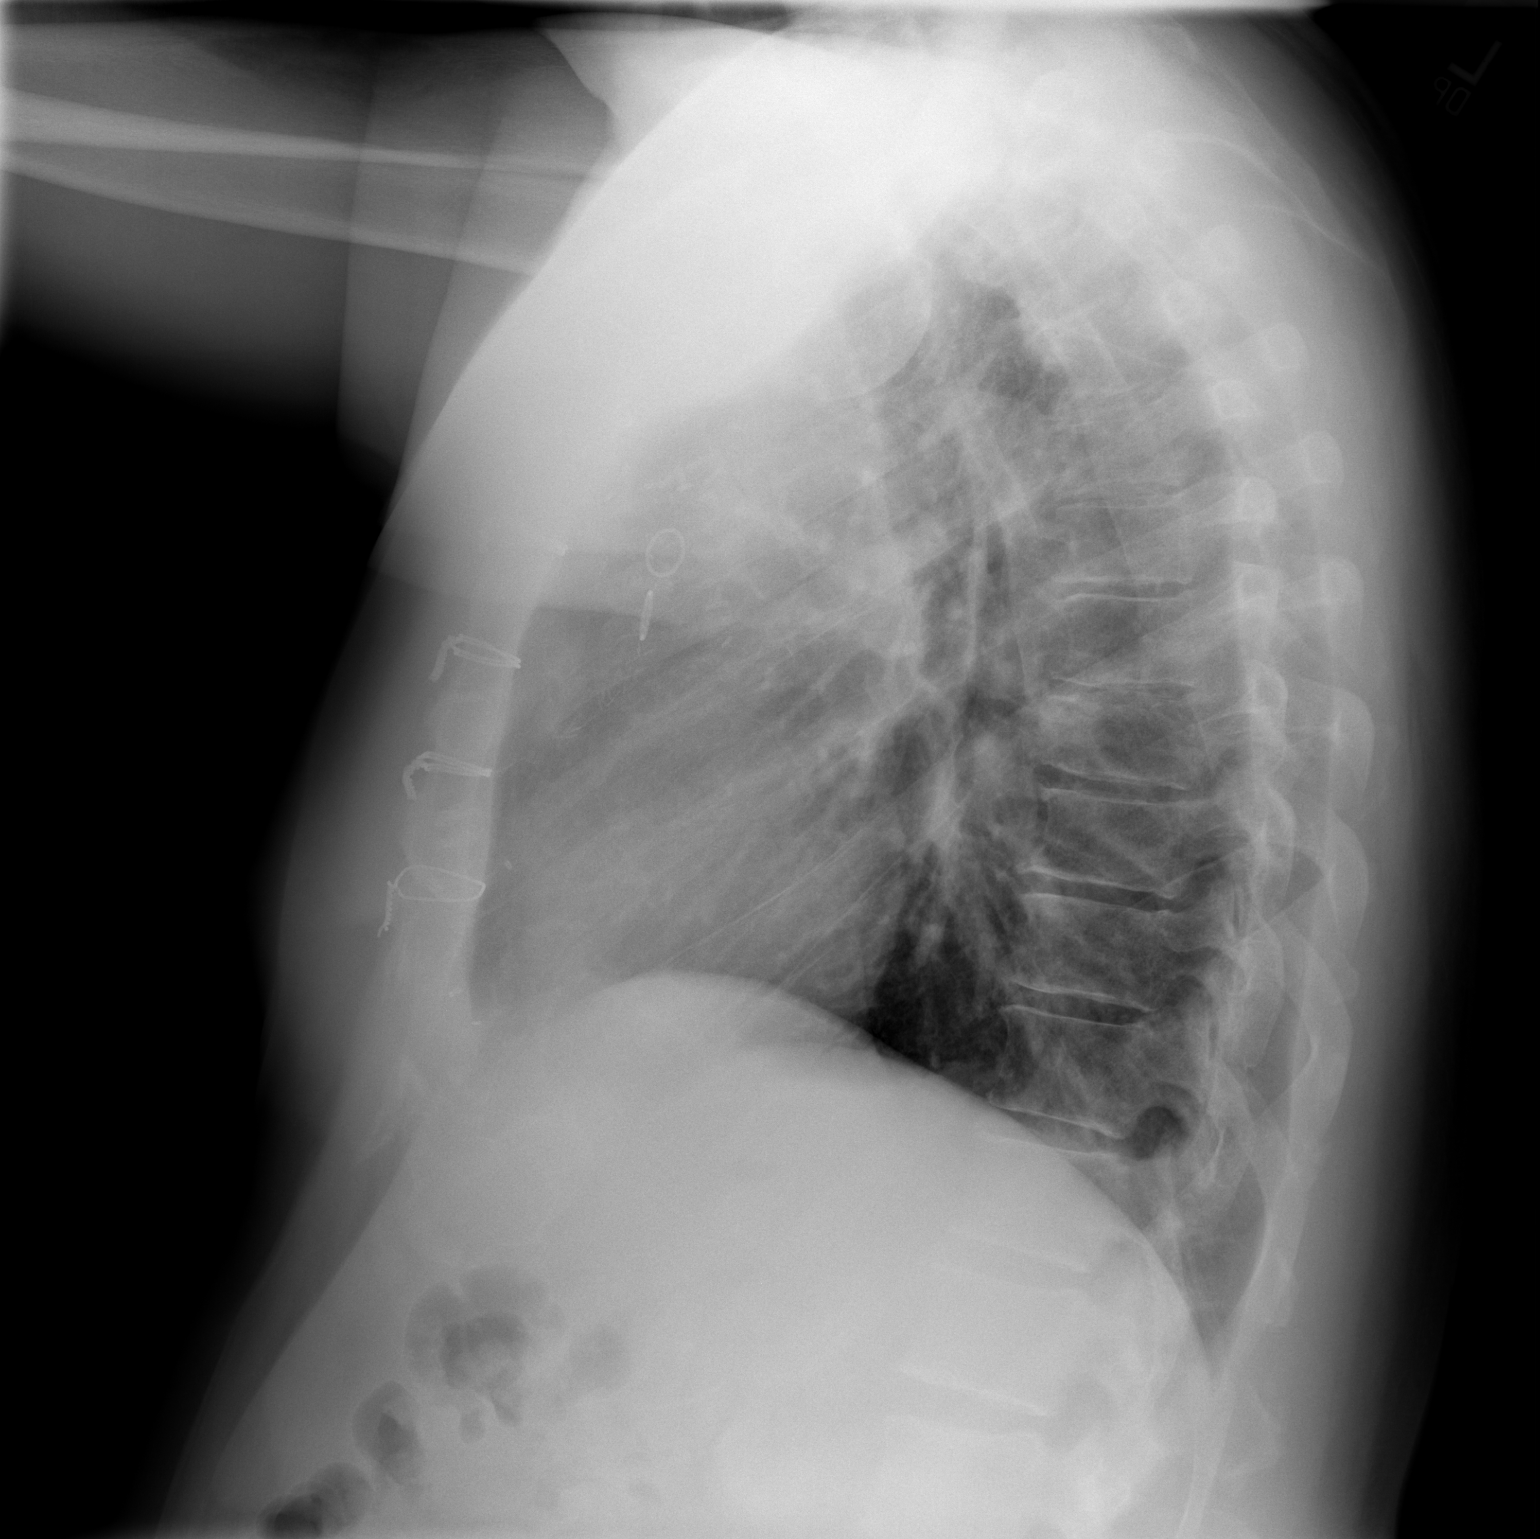

[2 of 2 positions shown; findings below may reference images not displayed]

FINDINGS: Aeration of the lungs has improved. There is a small left pleural
effusion present. No pneumonia is seen. Mild cardiomegaly is stable.
Median sternotomy sutures are noted. There are degenerative changes
throughout the thoracic spine.
IMPRESSION: Only a small left pleural effusion is noted. No definite pneumonia
is seen.

## 2017-01-14 ENCOUNTER — Ambulatory Visit
Admission: RE | Admit: 2017-01-14 | Discharge: 2017-01-14 | Disposition: A | Discharge status: Home / Self Care | Payer: BC Managed Care – PPO | Source: Ambulatory Visit | Attending: Urology | Admitting: Urology

## 2017-01-14 ENCOUNTER — Telehealth: Payer: Self-pay | Admitting: Urology

## 2017-01-14 DIAGNOSIS — N1330 Unspecified hydronephrosis: Secondary | ICD-10-CM | POA: Insufficient documentation

## 2017-01-14 DIAGNOSIS — N281 Cyst of kidney, acquired: Secondary | ICD-10-CM | POA: Insufficient documentation

## 2017-01-14 DIAGNOSIS — R109 Unspecified abdominal pain: Secondary | ICD-10-CM | POA: Insufficient documentation

## 2017-01-14 NOTE — Telephone Encounter (Signed)
Called ptient to discuss renal ultrasound results from today. He has moderate right hydronephrosis, possibly related to an untreated stone last year. He also has mild left hydronephrosis concerning for partial bilateral obstruction.  He reports today that he has no further left flank pain other than occasional twinge. He has not seen a stone pass.  I have recommended urgent CT stone protocol, ideally tomorrow as well as a BMP to assess overall renal function. Patient is agreeable to plan. He will go to the emergency room if he has decreased urine output or recurrent pain in the meantime.  He already has a follow-up with me in the 31st.  [name], MD

## 2017-01-15 ENCOUNTER — Ambulatory Visit
Admission: RE | Admit: 2017-01-15 | Discharge: 2017-01-15 | Disposition: A | Discharge status: Home / Self Care | Payer: BC Managed Care – PPO | Source: Ambulatory Visit | Attending: Urology | Admitting: Urology

## 2017-01-15 ENCOUNTER — Other Ambulatory Visit: Payer: Self-pay

## 2017-01-15 ENCOUNTER — Other Ambulatory Visit: Payer: Self-pay | Admitting: Radiology

## 2017-01-15 ENCOUNTER — Telehealth: Payer: Self-pay | Admitting: Cardiovascular Disease

## 2017-01-15 ENCOUNTER — Other Ambulatory Visit: Payer: BC Managed Care – PPO

## 2017-01-15 DIAGNOSIS — I2510 Atherosclerotic heart disease of native coronary artery without angina pectoris: Secondary | ICD-10-CM | POA: Insufficient documentation

## 2017-01-15 DIAGNOSIS — N1330 Unspecified hydronephrosis: Secondary | ICD-10-CM | POA: Insufficient documentation

## 2017-01-15 DIAGNOSIS — K760 Fatty (change of) liver, not elsewhere classified: Secondary | ICD-10-CM | POA: Diagnosis not present

## 2017-01-15 DIAGNOSIS — K8020 Calculus of gallbladder without cholecystitis without obstruction: Secondary | ICD-10-CM | POA: Insufficient documentation

## 2017-01-15 DIAGNOSIS — I700 Atherosclerosis of aorta: Secondary | ICD-10-CM | POA: Diagnosis not present

## 2017-01-15 DIAGNOSIS — N201 Calculus of ureter: Secondary | ICD-10-CM

## 2017-01-15 DIAGNOSIS — N132 Hydronephrosis with renal and ureteral calculous obstruction: Secondary | ICD-10-CM | POA: Insufficient documentation

## 2017-01-15 DIAGNOSIS — K5730 Diverticulosis of large intestine without perforation or abscess without bleeding: Secondary | ICD-10-CM | POA: Diagnosis not present

## 2017-01-15 NOTE — Telephone Encounter (Signed)
New message    Amy from [facility] is calling for pt. She said he need surgery and they need clearance. She saw in the system pt was supposed to come back for 6 month follow up in March but pt did not come. Appt scheduled tomorrow at 2 with Vin Bhagat.  She asked for appt for pt.    Request for surgical clearance:  1. What type of surgery is being performed? Bilateral obstructing renal stones-laser lithothripsy  When is this surgery scheduled? Friday June 1st   2. Are there any medications that need to be held prior to surgery and how long? No, they will have him stay on his aspirin  3. Name of physician performing surgery? Dr. [name]  4. What is your office phone and fax number? Phone-[phone] [phone]

## 2017-01-15 NOTE — Telephone Encounter (Signed)
I had Seth Riddle put another order in for a stat CT scan and he is having it done today at 10:30 and he will see you tomorrow at 10:00.  Seth Riddle

## 2017-01-15 NOTE — Telephone Encounter (Signed)
Noted. Routing to Dr. Nahser for his awareness. 

## 2017-01-15 NOTE — Progress Notes (Signed)
Cardiology Office Note    Date:  01/16/2017   ID:  Seth Riddle, DOB 06/12/1961, MRN [id number]  PCP:  [name], MD  Cardiologist:  Dr. Elease Hashimoto Chief Complaint: Surgical clearance for laser lithothripsy for Bilateral obstructing renal stones by Dr. [name] (Phone-[phone] [phone])   History of Present Illness:   Seth Riddle is a 56 y.o. male CAD s/p CABG, DM, HLD and chronic systolic heart failure/ischemic cardiomyopathy present for cardiac clearance.  Hx of CAD with initial admission with NSTEMI. 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 Developed recurrent chest pain with anterior STEMI on 11/14/14. Underwent emergent PCI and IABP followed by emergent CABG.   Last echo 10/2015 showed LVEF of 40-45% (essestially unchanged since CABG) and grade 1 DD. He was complaining of chest pain and no pain when seen by Dr. Elease Hashimoto 05/2016. Offered stress test however declined. Her symptoms could be due to fluctuating blood pressure.  Sleep study 1/18 showed no significant OSA. Noted to have minimal noctural hypoxemia. Recommended to avoid alcohol, sedatives and other CNS depressants that may worsen sleep apnea and disrupt normal sleep architecture. Regular exercise.    Past Medical History:  Diagnosis Date  . Coronary artery disease   . Diabetes mellitus without complication (HCC)   . Dyslipidemia (high LDL; low HDL)   . Family history of premature CAD   . Headache   . History of kidney stones   . Low HDL (under 40)   . MI (myocardial infarction) (HCC) 2016  . PONV (postoperative nausea and vomiting)    as a child eye surgery    Past Surgical History:  Procedure Laterality Date  . CORONARY ARTERY BYPASS GRAFT N/A 11/15/2014   Procedure: CORONARY ARTERY BYPASS GRAFTING (CABG), ON PUMP, TIMES THREE, USING LEFT INTERNAL MAMMARY ARTERY, RIGHT GREATER SAPHENOUS VEIN HARVESTED ENDOSCOPICALLY;  Surgeon: Loreli Slot, MD;  Location: [facility] OR;  Service: Open  Heart Surgery;  Laterality: N/A;  . EYE SURGERY     multiple eye surgeries as a child  . INTRA-AORTIC BALLOON PUMP INSERTION N/A 11/15/2014   Procedure: INTRA-AORTIC BALLOON PUMP INSERTION;  Surgeon: Lennette Bihari, MD;  Location: [facility] CATH LAB;  Service: Cardiovascular;  Laterality: N/A;  . LEFT HEART CATHETERIZATION WITH CORONARY ANGIOGRAM N/A 11/14/2014   Procedure: LEFT HEART CATHETERIZATION WITH CORONARY ANGIOGRAM;  Surgeon: Lennette Bihari, MD;  Location: [facility] CATH LAB;  Service: Cardiovascular;  Laterality: N/A;  . PERCUTANEOUS CORONARY STENT INTERVENTION (PCI-S) N/A 11/15/2014   Procedure: PERCUTANEOUS CORONARY STENT INTERVENTION (PCI-S);  Surgeon: Lennette Bihari, MD;  Location: [facility] CATH LAB;  Service: Cardiovascular;  Laterality: N/A;  . STOMACH SURGERY     at 59 weeks old  . TEE WITHOUT CARDIOVERSION N/A 11/15/2014   Procedure: TRANSESOPHAGEAL ECHOCARDIOGRAM (TEE);  Surgeon: Loreli Slot, MD;  Location: [facility] OR;  Service: Open Heart Surgery;  Laterality: N/A;    Current Medications: Prior to Admission medications   Medication Sig Start Date End Date Taking? Authorizing Provider  aspirin EC 81 MG tablet Take 1 tablet (81 mg total) by mouth daily. 01/05/15   Nahser, Deloris Ping, MD  lisinopril (PRINIVIL,ZESTRIL) 5 MG tablet Take 1 tablet (5 mg total) by mouth daily. 05/07/16   Nahser, Deloris Ping, MD  metFORMIN (GLUCOPHAGE) 500 MG tablet Take 500 mg by mouth 2 (two) times daily with a meal.    [provider]  metoprolol tartrate (LOPRESSOR) 25 MG tablet Take 1 tablet (25 mg total) by mouth 2 (two) times  daily. 06/03/16   Nahser, Deloris Ping, MD  ondansetron (ZOFRAN) 4 MG tablet Take 1 tablet (4 mg total) by mouth every 8 (eight) hours as needed for nausea or vomiting. 01/03/17   [name], MD  oxyCODONE-acetaminophen (PERCOCET) 5-325 MG tablet Take 1-2 tablets by mouth every 4 (four) hours as needed for moderate pain or severe pain. 01/03/17   [name], MD  rosuvastatin  (CRESTOR) 20 MG tablet TAKE 1 TABLET BY MOUTH EVERY DAY 11/07/16   Nahser, Deloris Ping, MD  tamsulosin (FLOMAX) 0.4 MG CAPS capsule Take 1 capsule (0.4 mg total) by mouth daily. Patient not taking: Reported on 01/03/2017 04/26/16   Myrna Blazer, MD  tamsulosin (FLOMAX) 0.4 MG CAPS capsule Take 1 capsule (0.4 mg total) by mouth daily. 01/03/17   [name], MD  traMADol (ULTRAM) 50 MG tablet Take 50 mg by mouth every 6 (six) hours as needed for pain. 05/01/16   [provider]    Allergies:   Coconut oil   Social History   Social History  . Marital status: Married    Spouse name: N/A  . Number of children: N/A  . Years of education: N/A   Social History Main Topics  . Smoking status: Never Smoker  . Smokeless tobacco: Never Used  . Alcohol use 0.0 - 0.6 oz/week     Comment: 1 glass of wine every 3 months  . Drug use: No  . Sexual activity: Not Asked   Other Topics Concern  . None   Social History Narrative  . None     Family History:  The patient's family history includes Heart attack in his father; Heart disease in his mother; Nephrolithiasis in his mother; Stroke in his father and mother.  ROS:   Please see the history of present illness.    ROS All other systems reviewed and are negative.   PHYSICAL EXAM:   VS:  BP 100/72 (BP Location: Left Arm)   Pulse 84   Ht 5\' 11"  (1.803 m)   Wt 267 lb (121.1 kg)   BMI 37.24 kg/m    GEN: Well nourished, well developed, in no acute distress  HEENT: normal  Neck: no JVD, carotid bruits, or masses Cardiac: RRR; no murmurs, rubs, or gallops,no edema  Respiratory:  clear to auscultation bilaterally, normal work of breathing GI: soft, nontender, nondistended, + BS MS: no deformity or atrophy  Skin: warm and dry, no rash Neuro:  Alert and Oriented x 3, Strength and sensation are intact Psych: euthymic mood, full affect  Wt Readings from Last 3 Encounters:  01/16/17 267 lb (121.1 kg)  01/16/17 265 lb (120.2  kg)  01/16/17 265 lb (120.2 kg)      Studies/Labs Reviewed:   EKG:  EKG is ordered today.  The ekg ordered today demonstrates NSR  Recent Labs: 04/26/2016: ALT 42 01/15/2017: BUN 19; Creatinine, Ser 1.31; Potassium 4.3; Sodium 139 01/16/2017: Hemoglobin 15.9; Platelets 178   Lipid Panel    Component Value Date/Time   CHOL 121 (L) 01/26/2016 0807   TRIG 160 (H) 01/26/2016 0807   HDL 25 (L) 01/26/2016 0807   CHOLHDL 4.8 01/26/2016 0807   VLDL 32 (H) 01/26/2016 0807   LDLCALC 64 01/26/2016 0807    Additional studies/ records that were reviewed today include:   Echocardiogram: 10/2015 Study Conclusions  - Left ventricle: The cavity size was normal. There was mild   concentric hypertrophy. Systolic function was mildly to   moderately reduced. The estimated ejection  fraction was in the   range of 40% to 45%. Severe hypokinesis of the   mid-apicalanteroseptal and anterior myocardium. Akinesis of the   apical myocardium. Doppler parameters are consistent with   abnormal left ventricular relaxation (grade 1 diastolic   dysfunction). - Left atrium: The atrium was mildly dilated.  CABG 10/2014 Left internal mammary artery to left anterior descending Saphenous vein graft to first diagonal Saphenous vein graft to first obtuse marginal   ASSESSMENT & PLAN:    1. CAD s/p CABG x 3 - No angina. Continue aspirin, statin, beta blocker and ace.  2. ICM/Chronic systolic heart failure - Last echo 10/2015 showed LVEF of 40-45% (essestially unchanged since CABG) and grade 1 DD. No sign of HF. Continue BB and ACE.   3. HTN - Stable and well controlled on current medications.   4. HLD - 01/26/2016: Cholesterol 121; HDL 25; LDL Cholesterol 64; Triglycerides 160; VLDL 32  - Continue statin   5. Pre-operative clearance - He is cleared for surgery on moderate risk for cardiac complication based on Revised Cardiac Risk Index Nedra Hai Criteria).   Medication Adjustments/Labs and  Tests Ordered: Current medicines are reviewed at length with the patient today.  Concerns regarding medicines are outlined above.  Medication changes, Labs and Tests ordered today are listed in the Patient Instructions below. Patient Instructions  Medication Instructions:  Your physician recommends that you continue on your current medications as directed. Please refer to the Current Medication list given to you today.   Labwork: None ordered  Testing/Procedures: None ordered  Follow-Up: Your physician wants you to follow-up in: 6 MONTHS WITH DR. Elease Hashimoto   You will receive a reminder letter in the mail two months in advance. If you don't receive a letter, please call our office to schedule the follow-up appointment.   Any Other Special Instructions Will Be Listed Below (If Applicable).     If you need a refill on your cardiac medications before your next appointment, please call your pharmacy.      Lorelei Pont, [place]  01/16/2017 2:24 PM    [facility] [address] [place], [place], [place]  [address] Phone: [phone]; Fax: [phone]

## 2017-01-16 ENCOUNTER — Encounter
Admission: RE | Admit: 2017-01-16 | Discharge: 2017-01-16 | Disposition: A | Discharge status: Home / Self Care | Payer: BC Managed Care – PPO | Source: Ambulatory Visit | Attending: Urology | Admitting: Urology

## 2017-01-16 ENCOUNTER — Ambulatory Visit (INDEPENDENT_AMBULATORY_CARE_PROVIDER_SITE_OTHER): Payer: BC Managed Care – PPO | Admitting: Urology

## 2017-01-16 ENCOUNTER — Encounter: Payer: Self-pay | Admitting: Urology

## 2017-01-16 ENCOUNTER — Ambulatory Visit (INDEPENDENT_AMBULATORY_CARE_PROVIDER_SITE_OTHER): Payer: BC Managed Care – PPO | Admitting: Physician Assistant

## 2017-01-16 ENCOUNTER — Encounter: Payer: Self-pay | Admitting: Physician Assistant

## 2017-01-16 VITALS — BP 112/76 | HR 76 | Ht 71.0 in | Wt 265.0 lb

## 2017-01-16 VITALS — BP 100/72 | HR 84 | Ht 71.0 in | Wt 267.0 lb

## 2017-01-16 DIAGNOSIS — Z7982 Long term (current) use of aspirin: Secondary | ICD-10-CM | POA: Diagnosis not present

## 2017-01-16 DIAGNOSIS — I10 Essential (primary) hypertension: Secondary | ICD-10-CM | POA: Diagnosis not present

## 2017-01-16 DIAGNOSIS — Z87442 Personal history of urinary calculi: Secondary | ICD-10-CM | POA: Diagnosis not present

## 2017-01-16 DIAGNOSIS — I5022 Chronic systolic (congestive) heart failure: Secondary | ICD-10-CM | POA: Diagnosis not present

## 2017-01-16 DIAGNOSIS — I2510 Atherosclerotic heart disease of native coronary artery without angina pectoris: Secondary | ICD-10-CM | POA: Diagnosis not present

## 2017-01-16 DIAGNOSIS — Z79899 Other long term (current) drug therapy: Secondary | ICD-10-CM | POA: Diagnosis not present

## 2017-01-16 DIAGNOSIS — N132 Hydronephrosis with renal and ureteral calculous obstruction: Secondary | ICD-10-CM | POA: Diagnosis not present

## 2017-01-16 DIAGNOSIS — N200 Calculus of kidney: Secondary | ICD-10-CM

## 2017-01-16 DIAGNOSIS — I252 Old myocardial infarction: Secondary | ICD-10-CM | POA: Diagnosis not present

## 2017-01-16 DIAGNOSIS — N281 Cyst of kidney, acquired: Secondary | ICD-10-CM

## 2017-01-16 DIAGNOSIS — E7849 Other hyperlipidemia: Secondary | ICD-10-CM

## 2017-01-16 DIAGNOSIS — Z7984 Long term (current) use of oral hypoglycemic drugs: Secondary | ICD-10-CM | POA: Diagnosis not present

## 2017-01-16 DIAGNOSIS — N201 Calculus of ureter: Secondary | ICD-10-CM | POA: Diagnosis present

## 2017-01-16 DIAGNOSIS — I25708 Atherosclerosis of coronary artery bypass graft(s), unspecified, with other forms of angina pectoris: Secondary | ICD-10-CM | POA: Diagnosis not present

## 2017-01-16 DIAGNOSIS — Z8249 Family history of ischemic heart disease and other diseases of the circulatory system: Secondary | ICD-10-CM | POA: Diagnosis not present

## 2017-01-16 DIAGNOSIS — Z951 Presence of aortocoronary bypass graft: Secondary | ICD-10-CM | POA: Diagnosis not present

## 2017-01-16 DIAGNOSIS — N1330 Unspecified hydronephrosis: Secondary | ICD-10-CM

## 2017-01-16 DIAGNOSIS — Z01818 Encounter for other preprocedural examination: Secondary | ICD-10-CM

## 2017-01-16 DIAGNOSIS — E784 Other hyperlipidemia: Secondary | ICD-10-CM

## 2017-01-16 DIAGNOSIS — E119 Type 2 diabetes mellitus without complications: Secondary | ICD-10-CM | POA: Diagnosis not present

## 2017-01-16 DIAGNOSIS — Z91018 Allergy to other foods: Secondary | ICD-10-CM | POA: Diagnosis not present

## 2017-01-16 DIAGNOSIS — E785 Hyperlipidemia, unspecified: Secondary | ICD-10-CM | POA: Diagnosis not present

## 2017-01-16 HISTORY — DX: Headache, unspecified: R51.9

## 2017-01-16 HISTORY — DX: Personal history of urinary calculi: Z87.442

## 2017-01-16 HISTORY — DX: Other specified postprocedural states: Z98.890

## 2017-01-16 HISTORY — DX: Headache: R51

## 2017-01-16 HISTORY — DX: Other specified postprocedural states: R11.2

## 2017-01-16 LAB — CBC
HEMATOCRIT: 46.8 % (ref 40.0–52.0)
HEMOGLOBIN: 15.9 g/dL (ref 13.0–18.0)
MCH: 30.5 pg (ref 26.0–34.0)
MCHC: 34 g/dL (ref 32.0–36.0)
MCV: 89.7 fL (ref 80.0–100.0)
Platelets: 178 10*3/uL (ref 150–440)
RBC: 5.22 MIL/uL (ref 4.40–5.90)
RDW: 12.4 % (ref 11.5–14.5)
WBC: 8.1 10*3/uL (ref 3.8–10.6)

## 2017-01-16 LAB — BASIC METABOLIC PANEL
BUN/Creatinine Ratio: 15 (ref 9–20)
BUN: 19 mg/dL (ref 6–24)
CALCIUM: 9.7 mg/dL (ref 8.7–10.2)
CO2: 25 mmol/L (ref 18–29)
CREATININE: 1.31 mg/dL — AB (ref 0.76–1.27)
Chloride: 100 mmol/L (ref 96–106)
GFR, EST AFRICAN AMERICAN: 70 mL/min/{1.73_m2} (ref >59)
GFR, EST NON AFRICAN AMERICAN: 60 mL/min/{1.73_m2} (ref >59)
Glucose: 113 mg/dL — ABNORMAL HIGH (ref 65–99)
POTASSIUM: 4.3 mmol/L (ref 3.5–5.2)
Sodium: 139 mmol/L (ref 134–144)

## 2017-01-16 LAB — URINALYSIS, COMPLETE
Bilirubin, UA: NEGATIVE
Glucose, UA: NEGATIVE
Ketones, UA: NEGATIVE
LEUKOCYTES UA: NEGATIVE
NITRITE UA: NEGATIVE
PROTEIN UA: NEGATIVE
RBC UA: NEGATIVE
Specific Gravity, UA: 1.025 (ref 1.005–1.030)
Urobilinogen, Ur: 0.2 mg/dL (ref 0.2–1.0)
pH, UA: 5 (ref 5.0–7.5)

## 2017-01-16 MED ORDER — CEFAZOLIN SODIUM-DEXTROSE 2-4 GM/100ML-% IV SOLN
2.0000 g | INTRAVENOUS | Status: AC
  Administered 2017-01-17: 2 g via INTRAVENOUS

## 2017-01-16 NOTE — Pre-Procedure Instructions (Signed)
Pt has an appt at his cardiologist's office at 2pm today for a clearance to include an EKG.

## 2017-01-16 NOTE — Progress Notes (Signed)
01/16/2017 3:56 PM   Seth Riddle 1961-06-05 [id number]  Referring provider: [name], MD [phone] [address] [facility] [place], [place] [address]  CC: flank pain  HPI: 56 year old male with bilateral partially obstructing ureteral calculi, 4 mm left UVJ stone along with 25 mm right distal ureteral stones (mild hydro) at the level of the iliac with moderate hydroureteronephrosis on the side.  He initially was seen and evaluated last year in 04/2016 for a 7 mm right UPJ stone with moderate hydronephrosis. ESWL was booked that he ultimately elected to cancel this procedure due to concern for cost. He was counseled to return for follow-up and also declined a follow-up AGAINST MEDICAL ADVICE.  He returned to the office on 01/03/2017 with acute onset severe left flank pain. We are able to control his pain. Stone was not able to be identified on KUB that day. He underwent follow-up renal ultrasound 2 days ago which showed moderate right and mild left hydronephrosis concerning for bilateral stones. He underwent stat CT scan the following day indicating the above.   There is also a 5.8 cm right lower pole renal cyst which is not fully characterize a noncontrast CT scan.    He denies any fevers or chills.  No difficulties urinating. No gross hematuria or dysuria.  His pain on the left side has now resolved to have occasional dull ache but no severe pain.  History of CAD s/p CABG on ASA.   PMH: Past Medical History:  Diagnosis Date  . Coronary artery disease   . Diabetes mellitus without complication (HCC)   . Dyslipidemia (high LDL; low HDL)   . Family history of premature CAD   . Headache   . History of kidney stones   . Low HDL (under 40)   . MI (myocardial infarction) (HCC) 2016  . PONV (postoperative nausea and vomiting)    as a child eye surgery    Surgical History: Past Surgical History:  Procedure Laterality Date  . CORONARY  ARTERY BYPASS GRAFT N/A 11/15/2014   Procedure: CORONARY ARTERY BYPASS GRAFTING (CABG), ON PUMP, TIMES THREE, USING LEFT INTERNAL MAMMARY ARTERY, RIGHT GREATER SAPHENOUS VEIN HARVESTED ENDOSCOPICALLY;  Surgeon: Loreli Slot, MD;  Location: [facility] OR;  Service: Open Heart Surgery;  Laterality: N/A;  . EYE SURGERY     multiple eye surgeries as a child  . INTRA-AORTIC BALLOON PUMP INSERTION N/A 11/15/2014   Procedure: INTRA-AORTIC BALLOON PUMP INSERTION;  Surgeon: Lennette Bihari, MD;  Location: [facility] CATH LAB;  Service: Cardiovascular;  Laterality: N/A;  . LEFT HEART CATHETERIZATION WITH CORONARY ANGIOGRAM N/A 11/14/2014   Procedure: LEFT HEART CATHETERIZATION WITH CORONARY ANGIOGRAM;  Surgeon: Lennette Bihari, MD;  Location: [facility] CATH LAB;  Service: Cardiovascular;  Laterality: N/A;  . PERCUTANEOUS CORONARY STENT INTERVENTION (PCI-S) N/A 11/15/2014   Procedure: PERCUTANEOUS CORONARY STENT INTERVENTION (PCI-S);  Surgeon: Lennette Bihari, MD;  Location: [facility] CATH LAB;  Service: Cardiovascular;  Laterality: N/A;  . STOMACH SURGERY     at 50 weeks old  . TEE WITHOUT CARDIOVERSION N/A 11/15/2014   Procedure: TRANSESOPHAGEAL ECHOCARDIOGRAM (TEE);  Surgeon: Loreli Slot, MD;  Location: [facility] OR;  Service: Open Heart Surgery;  Laterality: N/A;    Home Medications:  Allergies as of 01/16/2017      Reactions   Coconut Oil Hives      Medication List       Accurate as of 01/16/17  3:56 PM. Always use your  most recent med list.          aspirin EC 81 MG tablet Take 1 tablet (81 mg total) by mouth daily.   lisinopril 5 MG tablet Commonly known as:  PRINIVIL,ZESTRIL Take 1 tablet (5 mg total) by mouth daily.   metFORMIN 500 MG tablet Commonly known as:  GLUCOPHAGE Take 500 mg by mouth 2 (two) times daily with a meal.   metoprolol tartrate 25 MG tablet Commonly known as:  LOPRESSOR Take 1 tablet (25 mg total) by mouth 2 (two) times daily.   ondansetron 4 MG tablet Commonly known as:   ZOFRAN Take 1 tablet (4 mg total) by mouth every 8 (eight) hours as needed for nausea or vomiting.   oxyCODONE-acetaminophen 5-325 MG tablet Commonly known as:  PERCOCET Take 1-2 tablets by mouth every 4 (four) hours as needed for moderate pain or severe pain.   rosuvastatin 20 MG tablet Commonly known as:  CRESTOR TAKE 1 TABLET BY MOUTH EVERY DAY   tamsulosin 0.4 MG Caps capsule Commonly known as:  FLOMAX Take 1 capsule (0.4 mg total) by mouth daily.   traMADol 50 MG tablet Commonly known as:  ULTRAM Take 50 mg by mouth every 6 (six) hours as needed for pain.       Allergies:  Allergies  Allergen Reactions  . Coconut Oil Hives    Family History: Family History  Problem Relation Age of Onset  . Heart disease Mother   . Stroke Mother   . Nephrolithiasis Mother   . Heart attack Father   . Stroke Father   . Kidney cancer Neg Hx   . Bladder Cancer Neg Hx   . Prostate cancer Neg Hx     Social History:  reports that he has never smoked. He has never used smokeless tobacco. He reports that he drinks alcohol. He reports that he does not use drugs.  ROS: 12 point review of systems was performed and is negative other than as per history of present illness.  Physical Exam: BP 112/76   Pulse 76   Ht 5\' 11"  (1.803 m)   Wt 265 lb (120.2 kg)   BMI 36.96 kg/m   Constitutional:  Alert and oriented, Presents with wife today.  Mild stress caring emesis basin. HEENT:  AT, moist mucus membranes.  Trachea midline, no masses. Cardiovascular: No clubbing, cyanosis, or edema. Respiratory: Normal respiratory effort, no increased work of breathing. GI: Abdomen is soft, nontender, nondistended, no abdominal masses GU: No CVA tenderness.  Skin: No rashes, bruises or suspicious lesions. Neurologic: Grossly intact, no focal deficits, moving all 4 extremities. Psychiatric: Normal mood and affect.  Laboratory Data: Lab Results  Component Value Date   WBC 8.1 01/16/2017   HGB 15.9  01/16/2017   HCT 46.8 01/16/2017   MCV 89.7 01/16/2017   PLT 178 01/16/2017    Lab Results  Component Value Date   CREATININE 1.31 (H) 01/15/2017    Lab Results  Component Value Date   HGBA1C 6.6 (H) 11/13/2014    Urinalysis Results for orders placed or performed in visit on 01/16/17  Urinalysis, Complete  Result Value Ref Range   Specific Gravity, UA 1.025 1.005 - 1.030   pH, UA 5.0 5.0 - 7.5   Color, UA Yellow Yellow   Appearance Ur Clear Clear   Leukocytes, UA Negative Negative   Protein, UA Negative Negative/Trace   Glucose, UA Negative Negative   Ketones, UA Negative Negative   RBC, UA Negative Negative  Bilirubin, UA Negative Negative   Urobilinogen, Ur 0.2 0.2 - 1.0 mg/dL   Nitrite, UA Negative Negative    Pertinent Imaging: CLINICAL DATA:  Bilateral hydronephrosis on renal sonogram from 1 day prior. History of nephrolithiasis.  EXAM: CT ABDOMEN AND PELVIS WITHOUT CONTRAST  TECHNIQUE: Multidetector CT imaging of the abdomen and pelvis was performed following the standard protocol without IV contrast.  COMPARISON:  01/14/2017 renal sonogram. 01/03/2017 abdominal radiographs. 04/26/2016 unenhanced CT abdomen/ pelvis.  FINDINGS: Lower chest: No significant pulmonary nodules or acute consolidative airspace disease. Right coronary atherosclerosis.  Hepatobiliary: Normal liver size. Mild diffuse hepatic steatosis. No liver mass. No definite liver surface irregularity. Cholelithiasis. No gallbladder distention, gallbladder wall thickening or pericholecystic fluid. No biliary ductal dilatation.  Pancreas: Normal, with no mass or duct dilation.  Spleen: Normal size. No mass.  Adrenals/Urinary Tract: Normal adrenals. There are two clustered obstructing 5 mm right ureteral stones in the proximal pelvic segment of the right ureter, with moderate right hydroureteronephrosis. There is a 4 mm stone in the distal pelvic segment of the left ureter just  proximal to the left ureterovesical junction, with minimal left hydroureteronephrosis. No stones in the renal collecting systems. No additional ureteral stones. Simple 6.0 cm lower right renal cyst. No additional contour deforming renal lesions. Normal bladder.  Stomach/Bowel: Grossly normal stomach. Normal caliber small bowel with no small bowel wall thickening. Normal appendix. Mild sigmoid diverticulosis, with no large bowel wall thickening or pericolonic fat stranding.  Vascular/Lymphatic: Atherosclerotic nonaneurysmal abdominal aorta. No pathologically enlarged lymph nodes in the abdomen or pelvis.  Reproductive: Top-normal size prostate with nonspecific internal prostatic calcifications.  Other: No pneumoperitoneum, ascites or focal fluid collection.  Musculoskeletal: No aggressive appearing focal osseous lesions. Mild thoracolumbar spondylosis. Lower sternotomy wire is intact.  IMPRESSION: 1. Two clustered obstructing 5 mm right ureteral stones in the proximal pelvic segment of the right ureter, with moderate right hydroureteronephrosis. 2. Distal left pelvic ureteral 4 mm stone just proximal to the left UVJ, with minimal left hydroureteronephrosis . 3. Additional findings include aortic atherosclerosis, coronary atherosclerosis, mild diffuse hepatic steatosis, cholelithiasis and mild sigmoid diverticulosis.  These results will be called to the ordering clinician or representative by the Radiologist Assistant, and communication documented in the PACS or zVision Dashboard.   Electronically Signed   By: Delbert Phenix M.D.   On: 01/15/2017 11:44  CT scan imaging was personally reviewed today along with renal ultrasound imaging. This is also reviewed with the patient and his wife (by face time).  Assessment & Plan:    1. Bilateral partially obstructing ureteral calculi Mildly elevated creatinine yesterday, but not significantly to suggest severe acute renal  failure In the setting of bilateral partially ejected stones, I recommended urgent intervention. In the absence of infection, would recommend proceeding with bilateral ureteroscopy, laser lithotripsy, and ureteral stent placement. Risks and benefits of ureteroscopy were reviewed including but not limited to infection, bleeding, pain, ureteral injury which could require open surgery versus prolonged indwelling if ureteralperforation occurs, persistent stone disease, requirement for staged procedure, possible stent, and global anesthesia risks. Patient expressed understanding and desires to proceed with ureteroscopy. Given his history of CAD, would recommend continuation of 81 mg of aspirin Cardiac clearance today -UA  2. Renal cyst, acquired, right Appears to be a simple cyst on the right, no further workup indicated  Schedule surgery tomorrow as above   [name], MD  [facility]  [address] [place], [place] [address] [phone]  I spent 25 min with this patient of which greater than 50% was spent in counseling and coordination of care with the patient.

## 2017-01-16 NOTE — Patient Instructions (Signed)
  Your procedure is scheduled on: Tomorrow June 1 , 2018. Report to Same Day Surgery. To find out your arrival time please call [phone] between 1PM - 3PM later today.  Remember: Instructions that are not followed completely may result in serious medical risk, up to and including death, or upon the discretion of your surgeon and anesthesiologist your surgery may need to be rescheduled.    _x___ 1. Do not eat food or drink liquids after midnight. No gum chewing or hard candies.     _x___ 2. No Alcohol for 24 hours before or after surgery.   ____ 3. Bring all medications with you on the day of surgery if instructed.    __x__ 4. Notify your doctor if there is any change in your medical condition     (cold, fever, infections).    _____ 5. No smoking 24 hours prior to surgery.     Do not wear jewelry, make-up, hairpins, clips or nail polish.  Do not wear lotions, powders, or perfumes.   Do not shave 48 hours prior to surgery. Men may shave face and neck.  Do not bring valuables to the hospital.    [facility] is not responsible for any belongings or valuables.               Contacts, dentures or bridgework may not be worn into surgery.  Leave your suitcase in the car. After surgery it may be brought to your room.  For patients admitted to the hospital, discharge time is determined by your treatment team.   Patients discharged the day of surgery will not be allowed to drive home.    Please read over the following fact sheets that you were given:   [facility] Preparing for Surgery  _x___ Take these medicines the morning of surgery with A SIP OF WATER:    1. lisinopril (PRINIVIL,ZESTRIL)   2. metoprolol tartrate (LOPRESSOR)   ____ Fleet Enema (as directed)   ____ Use CHG Soap as directed on instruction sheet  ____ Use inhalers on the day of surgery and bring to hospital day of surgery  ____ Stop metformin 2 days prior to surgery    ____ Take 1/2 of usual insulin dose the  night before surgery and none on the morning of surgery.   ____ Stop Coumadin/Plavix/aspirin on does not apply.  _x__ Stop Anti-inflammatories such as Advil, Aleve, Ibuprofen, Motrin, Naproxen,  Naprosyn, Goodies powders or aspirin products. OK to take Tylenol.   ____ Stop supplements until after surgery.    ____ Bring C-Pap to the hospital.

## 2017-01-16 NOTE — Patient Instructions (Signed)
Medication Instructions:  Your physician recommends that you continue on your current medications as directed. Please refer to the Current Medication list given to you today.   Labwork: None ordered  Testing/Procedures: None ordered  Follow-Up: Your physician wants you to follow-up in: 6 MONTHS WITH DR. NAHSER  You will receive a reminder letter in the mail two months in advance. If you don't receive a letter, please call our office to schedule the follow-up appointment.   Any Other Special Instructions Will Be Listed Below (If Applicable).     If you need a refill on your cardiac medications before your next appointment, please call your pharmacy.   

## 2017-01-16 NOTE — Telephone Encounter (Signed)
He has been stable and should be at low risk for lithotripsy. Agree with continuing ASA

## 2017-01-17 ENCOUNTER — Ambulatory Visit
Admission: RE | Admit: 2017-01-17 | Discharge: 2017-01-17 | Disposition: A | Discharge status: Home / Self Care | Payer: BC Managed Care – PPO | Source: Ambulatory Visit | Attending: Urology | Admitting: Urology

## 2017-01-17 ENCOUNTER — Ambulatory Visit: Payer: BC Managed Care – PPO | Admitting: Anesthesiology

## 2017-01-17 ENCOUNTER — Encounter
Admission: RE | Disposition: A | Discharge status: Home / Self Care | Payer: Self-pay | Source: Ambulatory Visit | Attending: Urology

## 2017-01-17 ENCOUNTER — Encounter: Payer: Self-pay | Admitting: *Deleted

## 2017-01-17 DIAGNOSIS — Z7982 Long term (current) use of aspirin: Secondary | ICD-10-CM | POA: Insufficient documentation

## 2017-01-17 DIAGNOSIS — I252 Old myocardial infarction: Secondary | ICD-10-CM | POA: Insufficient documentation

## 2017-01-17 DIAGNOSIS — N132 Hydronephrosis with renal and ureteral calculous obstruction: Secondary | ICD-10-CM | POA: Diagnosis not present

## 2017-01-17 DIAGNOSIS — Z951 Presence of aortocoronary bypass graft: Secondary | ICD-10-CM | POA: Insufficient documentation

## 2017-01-17 DIAGNOSIS — N201 Calculus of ureter: Secondary | ICD-10-CM | POA: Diagnosis not present

## 2017-01-17 DIAGNOSIS — E785 Hyperlipidemia, unspecified: Secondary | ICD-10-CM | POA: Insufficient documentation

## 2017-01-17 DIAGNOSIS — Z87442 Personal history of urinary calculi: Secondary | ICD-10-CM | POA: Insufficient documentation

## 2017-01-17 DIAGNOSIS — E119 Type 2 diabetes mellitus without complications: Secondary | ICD-10-CM | POA: Insufficient documentation

## 2017-01-17 DIAGNOSIS — I10 Essential (primary) hypertension: Secondary | ICD-10-CM | POA: Insufficient documentation

## 2017-01-17 DIAGNOSIS — Z8249 Family history of ischemic heart disease and other diseases of the circulatory system: Secondary | ICD-10-CM | POA: Insufficient documentation

## 2017-01-17 DIAGNOSIS — I2510 Atherosclerotic heart disease of native coronary artery without angina pectoris: Secondary | ICD-10-CM | POA: Insufficient documentation

## 2017-01-17 DIAGNOSIS — Z91018 Allergy to other foods: Secondary | ICD-10-CM | POA: Insufficient documentation

## 2017-01-17 DIAGNOSIS — Z7984 Long term (current) use of oral hypoglycemic drugs: Secondary | ICD-10-CM | POA: Insufficient documentation

## 2017-01-17 DIAGNOSIS — Z79899 Other long term (current) drug therapy: Secondary | ICD-10-CM | POA: Insufficient documentation

## 2017-01-17 HISTORY — PX: CYSTOSCOPY WITH STENT PLACEMENT: SHX5790

## 2017-01-17 HISTORY — PX: URETEROSCOPY WITH HOLMIUM LASER LITHOTRIPSY: SHX6645

## 2017-01-17 LAB — GLUCOSE, CAPILLARY
GLUCOSE-CAPILLARY: 125 mg/dL — AB (ref 65–99)
Glucose-Capillary: 122 mg/dL — ABNORMAL HIGH (ref 65–99)

## 2017-01-17 SURGERY — URETEROSCOPY, WITH LITHOTRIPSY USING HOLMIUM LASER
Anesthesia: General | Site: Ureter | Laterality: Bilateral | Wound class: Clean Contaminated

## 2017-01-17 MED ORDER — FENTANYL CITRATE (PF) 100 MCG/2ML IJ SOLN
INTRAMUSCULAR | Status: AC
  Filled 2017-01-17: qty 2

## 2017-01-17 MED ORDER — PHENYLEPHRINE HCL 10 MG/ML IJ SOLN
INTRAMUSCULAR | Status: AC
  Filled 2017-01-17: qty 1

## 2017-01-17 MED ORDER — DOCUSATE SODIUM 100 MG PO CAPS: 100.0000 mg | ORAL_CAPSULE | Freq: Two times a day (BID) | ORAL | 0 refills | Status: DC

## 2017-01-17 MED ORDER — OXYCODONE HCL 5 MG PO TABS
5.0000 mg | ORAL_TABLET | Freq: Once | ORAL | Status: AC | PRN
  Administered 2017-01-17: 5 mg via ORAL

## 2017-01-17 MED ORDER — BELLADONNA ALKALOIDS-OPIUM 16.2-60 MG RE SUPP
RECTAL | Status: AC
  Filled 2017-01-17: qty 1

## 2017-01-17 MED ORDER — OXYCODONE-ACETAMINOPHEN 5-325 MG PO TABS: 1.0000 | ORAL_TABLET | ORAL | 0 refills | Status: DC | PRN

## 2017-01-17 MED ORDER — BELLADONNA ALKALOIDS-OPIUM 16.2-60 MG RE SUPP
1.0000 | Freq: Every day | RECTAL | Status: DC
  Administered 2017-01-17: 1 via RECTAL

## 2017-01-17 MED ORDER — DEXAMETHASONE SODIUM PHOSPHATE 10 MG/ML IJ SOLN
INTRAMUSCULAR | Status: AC
  Filled 2017-01-17: qty 1

## 2017-01-17 MED ORDER — FAMOTIDINE 20 MG PO TABS
20.0000 mg | ORAL_TABLET | Freq: Once | ORAL | Status: AC
  Administered 2017-01-17: 20 mg via ORAL

## 2017-01-17 MED ORDER — MIDAZOLAM HCL 2 MG/2ML IJ SOLN
INTRAMUSCULAR | Status: DC | PRN
  Administered 2017-01-17: 2 mg via INTRAVENOUS

## 2017-01-17 MED ORDER — PROPOFOL 10 MG/ML IV BOLUS
INTRAVENOUS | Status: AC
  Filled 2017-01-17: qty 20

## 2017-01-17 MED ORDER — MIDAZOLAM HCL 2 MG/2ML IJ SOLN
INTRAMUSCULAR | Status: AC
  Filled 2017-01-17: qty 2

## 2017-01-17 MED ORDER — FENTANYL CITRATE (PF) 100 MCG/2ML IJ SOLN
INTRAMUSCULAR | Status: DC | PRN
  Administered 2017-01-17: 25 ug via INTRAVENOUS
  Administered 2017-01-17 (×2): 50 ug via INTRAVENOUS

## 2017-01-17 MED ORDER — OXYCODONE HCL 5 MG PO TABS
ORAL_TABLET | ORAL | Status: AC
  Filled 2017-01-17: qty 1

## 2017-01-17 MED ORDER — PHENYLEPHRINE HCL 10 MG/ML IJ SOLN
INTRAMUSCULAR | Status: DC | PRN
  Administered 2017-01-17: 100 ug via INTRAVENOUS
  Administered 2017-01-17: 50 ug via INTRAVENOUS
  Administered 2017-01-17: 100 ug via INTRAVENOUS

## 2017-01-17 MED ORDER — DEXAMETHASONE SODIUM PHOSPHATE 10 MG/ML IJ SOLN
INTRAMUSCULAR | Status: DC | PRN
  Administered 2017-01-17: 10 mg via INTRAVENOUS

## 2017-01-17 MED ORDER — OXYBUTYNIN CHLORIDE 5 MG PO TABS: 5.0000 mg | ORAL_TABLET | Freq: Three times a day (TID) | ORAL | 0 refills | Status: DC | PRN

## 2017-01-17 MED ORDER — SODIUM CHLORIDE 0.9 % IV SOLN
INTRAVENOUS | Status: DC
  Administered 2017-01-17: 08:00:00 via INTRAVENOUS

## 2017-01-17 MED ORDER — FENTANYL CITRATE (PF) 100 MCG/2ML IJ SOLN
25.0000 ug | INTRAMUSCULAR | Status: DC | PRN
  Administered 2017-01-17 (×3): 50 ug via INTRAVENOUS

## 2017-01-17 MED ORDER — OXYCODONE HCL 5 MG/5ML PO SOLN: 5.0000 mg | Freq: Once | ORAL | Status: AC | PRN

## 2017-01-17 MED ORDER — ONDANSETRON HCL 4 MG/2ML IJ SOLN
INTRAMUSCULAR | Status: AC
  Filled 2017-01-17: qty 2

## 2017-01-17 MED ORDER — IOTHALAMATE MEGLUMINE 43 % IV SOLN
INTRAVENOUS | Status: DC | PRN
  Administered 2017-01-17: 20 mL via URETHRAL

## 2017-01-17 SURGICAL SUPPLY — 31 items
BAG DRAIN CYSTO-URO LG1000N (MISCELLANEOUS) ×3 IMPLANT
BASKET ZERO TIP 1.9FR (BASKET) ×3 IMPLANT
CATH ROBINSON RED A/P 16FR (CATHETERS) ×3 IMPLANT
CATH URETL 5X70 OPEN END (CATHETERS) ×3 IMPLANT
CNTNR SPEC 2.5X3XGRAD LEK (MISCELLANEOUS) ×1
CONRAY 43 FOR UROLOGY 50M (MISCELLANEOUS) ×3 IMPLANT
CONT SPEC 4OZ STER OR WHT (MISCELLANEOUS) ×2
CONTAINER SPEC 2.5X3XGRAD LEK (MISCELLANEOUS) ×1 IMPLANT
DRAPE UTILITY 15X26 TOWEL STRL (DRAPES) ×3 IMPLANT
FIBER LASER LITHO 273 (Laser) ×3 IMPLANT
GLOVE BIO SURGEON STRL SZ 6.5 (GLOVE) ×2 IMPLANT
GLOVE BIO SURGEONS STRL SZ 6.5 (GLOVE) ×1
GOWN STRL REUS W/ TWL LRG LVL3 (GOWN DISPOSABLE) ×2 IMPLANT
GOWN STRL REUS W/TWL LRG LVL3 (GOWN DISPOSABLE) ×4
GUIDEWIRE SUPER STIFF (WIRE) IMPLANT
INFUSOR MANOMETER BAG 3000ML (MISCELLANEOUS) ×3 IMPLANT
INTRODUCER DILATOR DOUBLE (INTRODUCER) IMPLANT
KIT RM TURNOVER CYSTO AR (KITS) ×3 IMPLANT
PACK CYSTO AR (MISCELLANEOUS) ×3 IMPLANT
SCRUB POVIDONE IODINE 4 OZ (MISCELLANEOUS) ×3 IMPLANT
SENSORWIRE 0.038 NOT ANGLED (WIRE) ×9
SET CYSTO W/LG BORE CLAMP LF (SET/KITS/TRAYS/PACK) ×3 IMPLANT
SHEATH URETERAL 12FRX35CM (MISCELLANEOUS) IMPLANT
SOL .9 NS 3000ML IRR  AL (IV SOLUTION) ×2
SOL .9 NS 3000ML IRR UROMATIC (IV SOLUTION) ×1 IMPLANT
STENT URET 6FRX24 CONTOUR (STENTS) IMPLANT
STENT URET 6FRX26 CONTOUR (STENTS) ×6 IMPLANT
SURGILUBE 2OZ TUBE FLIPTOP (MISCELLANEOUS) ×3 IMPLANT
SYRINGE IRR TOOMEY STRL 70CC (SYRINGE) IMPLANT
WATER STERILE IRR 1000ML POUR (IV SOLUTION) ×3 IMPLANT
WIRE SENSOR 0.038 NOT ANGLED (WIRE) ×3 IMPLANT

## 2017-01-17 NOTE — H&P (View-Only) (Signed)
01/16/2017 3:56 PM   Seth Riddle 1961-06-05 [id number]  Referring provider: [name], MD [phone] [address] [facility] [place], [place] [address]  CC: flank pain  HPI: 56 year old male with bilateral partially obstructing ureteral calculi, 4 mm left UVJ stone along with 25 mm right distal ureteral stones (mild hydro) at the level of the iliac with moderate hydroureteronephrosis on the side.  He initially was seen and evaluated last year in 04/2016 for a 7 mm right UPJ stone with moderate hydronephrosis. ESWL was booked that he ultimately elected to cancel this procedure due to concern for cost. He was counseled to return for follow-up and also declined a follow-up AGAINST MEDICAL ADVICE.  He returned to the office on 01/03/2017 with acute onset severe left flank pain. We are able to control his pain. Stone was not able to be identified on KUB that day. He underwent follow-up renal ultrasound 2 days ago which showed moderate right and mild left hydronephrosis concerning for bilateral stones. He underwent stat CT scan the following day indicating the above.   There is also a 5.8 cm right lower pole renal cyst which is not fully characterize a noncontrast CT scan.    He denies any fevers or chills.  No difficulties urinating. No gross hematuria or dysuria.  His pain on the left side has now resolved to have occasional dull ache but no severe pain.  History of CAD s/p CABG on ASA.   PMH: Past Medical History:  Diagnosis Date  . Coronary artery disease   . Diabetes mellitus without complication (HCC)   . Dyslipidemia (high LDL; low HDL)   . Family history of premature CAD   . Headache   . History of kidney stones   . Low HDL (under 40)   . MI (myocardial infarction) (HCC) 2016  . PONV (postoperative nausea and vomiting)    as a child eye surgery    Surgical History: Past Surgical History:  Procedure Laterality Date  . CORONARY  ARTERY BYPASS GRAFT N/A 11/15/2014   Procedure: CORONARY ARTERY BYPASS GRAFTING (CABG), ON PUMP, TIMES THREE, USING LEFT INTERNAL MAMMARY ARTERY, RIGHT GREATER SAPHENOUS VEIN HARVESTED ENDOSCOPICALLY;  Surgeon: Loreli Slot, MD;  Location: [facility] OR;  Service: Open Heart Surgery;  Laterality: N/A;  . EYE SURGERY     multiple eye surgeries as a child  . INTRA-AORTIC BALLOON PUMP INSERTION N/A 11/15/2014   Procedure: INTRA-AORTIC BALLOON PUMP INSERTION;  Surgeon: Lennette Bihari, MD;  Location: [facility] CATH LAB;  Service: Cardiovascular;  Laterality: N/A;  . LEFT HEART CATHETERIZATION WITH CORONARY ANGIOGRAM N/A 11/14/2014   Procedure: LEFT HEART CATHETERIZATION WITH CORONARY ANGIOGRAM;  Surgeon: Lennette Bihari, MD;  Location: [facility] CATH LAB;  Service: Cardiovascular;  Laterality: N/A;  . PERCUTANEOUS CORONARY STENT INTERVENTION (PCI-S) N/A 11/15/2014   Procedure: PERCUTANEOUS CORONARY STENT INTERVENTION (PCI-S);  Surgeon: Lennette Bihari, MD;  Location: [facility] CATH LAB;  Service: Cardiovascular;  Laterality: N/A;  . STOMACH SURGERY     at 50 weeks old  . TEE WITHOUT CARDIOVERSION N/A 11/15/2014   Procedure: TRANSESOPHAGEAL ECHOCARDIOGRAM (TEE);  Surgeon: Loreli Slot, MD;  Location: [facility] OR;  Service: Open Heart Surgery;  Laterality: N/A;    Home Medications:  Allergies as of 01/16/2017      Reactions   Coconut Oil Hives      Medication List       Accurate as of 01/16/17  3:56 PM. Always use your  most recent med list.          aspirin EC 81 MG tablet Take 1 tablet (81 mg total) by mouth daily.   lisinopril 5 MG tablet Commonly known as:  PRINIVIL,ZESTRIL Take 1 tablet (5 mg total) by mouth daily.   metFORMIN 500 MG tablet Commonly known as:  GLUCOPHAGE Take 500 mg by mouth 2 (two) times daily with a meal.   metoprolol tartrate 25 MG tablet Commonly known as:  LOPRESSOR Take 1 tablet (25 mg total) by mouth 2 (two) times daily.   ondansetron 4 MG tablet Commonly known as:   ZOFRAN Take 1 tablet (4 mg total) by mouth every 8 (eight) hours as needed for nausea or vomiting.   oxyCODONE-acetaminophen 5-325 MG tablet Commonly known as:  PERCOCET Take 1-2 tablets by mouth every 4 (four) hours as needed for moderate pain or severe pain.   rosuvastatin 20 MG tablet Commonly known as:  CRESTOR TAKE 1 TABLET BY MOUTH EVERY DAY   tamsulosin 0.4 MG Caps capsule Commonly known as:  FLOMAX Take 1 capsule (0.4 mg total) by mouth daily.   traMADol 50 MG tablet Commonly known as:  ULTRAM Take 50 mg by mouth every 6 (six) hours as needed for pain.       Allergies:  Allergies  Allergen Reactions  . Coconut Oil Hives    Family History: Family History  Problem Relation Age of Onset  . Heart disease Mother   . Stroke Mother   . Nephrolithiasis Mother   . Heart attack Father   . Stroke Father   . Kidney cancer Neg Hx   . Bladder Cancer Neg Hx   . Prostate cancer Neg Hx     Social History:  reports that he has never smoked. He has never used smokeless tobacco. He reports that he drinks alcohol. He reports that he does not use drugs.  ROS: 12 point review of systems was performed and is negative other than as per history of present illness.  Physical Exam: BP 112/76   Pulse 76   Ht 5\' 11"  (1.803 m)   Wt 265 lb (120.2 kg)   BMI 36.96 kg/m   Constitutional:  Alert and oriented, Presents with wife today.  Mild stress caring emesis basin. HEENT:  AT, moist mucus membranes.  Trachea midline, no masses. Cardiovascular: No clubbing, cyanosis, or edema. Respiratory: Normal respiratory effort, no increased work of breathing. GI: Abdomen is soft, nontender, nondistended, no abdominal masses GU: No CVA tenderness.  Skin: No rashes, bruises or suspicious lesions. Neurologic: Grossly intact, no focal deficits, moving all 4 extremities. Psychiatric: Normal mood and affect.  Laboratory Data: Lab Results  Component Value Date   WBC 8.1 01/16/2017   HGB 15.9  01/16/2017   HCT 46.8 01/16/2017   MCV 89.7 01/16/2017   PLT 178 01/16/2017    Lab Results  Component Value Date   CREATININE 1.31 (H) 01/15/2017    Lab Results  Component Value Date   HGBA1C 6.6 (H) 11/13/2014    Urinalysis Results for orders placed or performed in visit on 01/16/17  Urinalysis, Complete  Result Value Ref Range   Specific Gravity, UA 1.025 1.005 - 1.030   pH, UA 5.0 5.0 - 7.5   Color, UA Yellow Yellow   Appearance Ur Clear Clear   Leukocytes, UA Negative Negative   Protein, UA Negative Negative/Trace   Glucose, UA Negative Negative   Ketones, UA Negative Negative   RBC, UA Negative Negative  Bilirubin, UA Negative Negative   Urobilinogen, Ur 0.2 0.2 - 1.0 mg/dL   Nitrite, UA Negative Negative    Pertinent Imaging: CLINICAL DATA:  Bilateral hydronephrosis on renal sonogram from 1 day prior. History of nephrolithiasis.  EXAM: CT ABDOMEN AND PELVIS WITHOUT CONTRAST  TECHNIQUE: Multidetector CT imaging of the abdomen and pelvis was performed following the standard protocol without IV contrast.  COMPARISON:  01/14/2017 renal sonogram. 01/03/2017 abdominal radiographs. 04/26/2016 unenhanced CT abdomen/ pelvis.  FINDINGS: Lower chest: No significant pulmonary nodules or acute consolidative airspace disease. Right coronary atherosclerosis.  Hepatobiliary: Normal liver size. Mild diffuse hepatic steatosis. No liver mass. No definite liver surface irregularity. Cholelithiasis. No gallbladder distention, gallbladder wall thickening or pericholecystic fluid. No biliary ductal dilatation.  Pancreas: Normal, with no mass or duct dilation.  Spleen: Normal size. No mass.  Adrenals/Urinary Tract: Normal adrenals. There are two clustered obstructing 5 mm right ureteral stones in the proximal pelvic segment of the right ureter, with moderate right hydroureteronephrosis. There is a 4 mm stone in the distal pelvic segment of the left ureter just  proximal to the left ureterovesical junction, with minimal left hydroureteronephrosis. No stones in the renal collecting systems. No additional ureteral stones. Simple 6.0 cm lower right renal cyst. No additional contour deforming renal lesions. Normal bladder.  Stomach/Bowel: Grossly normal stomach. Normal caliber small bowel with no small bowel wall thickening. Normal appendix. Mild sigmoid diverticulosis, with no large bowel wall thickening or pericolonic fat stranding.  Vascular/Lymphatic: Atherosclerotic nonaneurysmal abdominal aorta. No pathologically enlarged lymph nodes in the abdomen or pelvis.  Reproductive: Top-normal size prostate with nonspecific internal prostatic calcifications.  Other: No pneumoperitoneum, ascites or focal fluid collection.  Musculoskeletal: No aggressive appearing focal osseous lesions. Mild thoracolumbar spondylosis. Lower sternotomy wire is intact.  IMPRESSION: 1. Two clustered obstructing 5 mm right ureteral stones in the proximal pelvic segment of the right ureter, with moderate right hydroureteronephrosis. 2. Distal left pelvic ureteral 4 mm stone just proximal to the left UVJ, with minimal left hydroureteronephrosis . 3. Additional findings include aortic atherosclerosis, coronary atherosclerosis, mild diffuse hepatic steatosis, cholelithiasis and mild sigmoid diverticulosis.  These results will be called to the ordering clinician or representative by the Radiologist Assistant, and communication documented in the PACS or zVision Dashboard.   Electronically Signed   By: Delbert Phenix M.D.   On: 01/15/2017 11:44  CT scan imaging was personally reviewed today along with renal ultrasound imaging. This is also reviewed with the patient and his wife (by face time).  Assessment & Plan:    1. Bilateral partially obstructing ureteral calculi Mildly elevated creatinine yesterday, but not significantly to suggest severe acute renal  failure In the setting of bilateral partially ejected stones, I recommended urgent intervention. In the absence of infection, would recommend proceeding with bilateral ureteroscopy, laser lithotripsy, and ureteral stent placement. Risks and benefits of ureteroscopy were reviewed including but not limited to infection, bleeding, pain, ureteral injury which could require open surgery versus prolonged indwelling if ureteralperforation occurs, persistent stone disease, requirement for staged procedure, possible stent, and global anesthesia risks. Patient expressed understanding and desires to proceed with ureteroscopy. Given his history of CAD, would recommend continuation of 81 mg of aspirin Cardiac clearance today -UA  2. Renal cyst, acquired, right Appears to be a simple cyst on the right, no further workup indicated  Schedule surgery tomorrow as above   [name], MD  [facility]  [address] [place], [place] [address] [phone]  I spent 25 min with this patient of which greater than 50% was spent in counseling and coordination of care with the patient.

## 2017-01-17 NOTE — Anesthesia Postprocedure Evaluation (Signed)
Anesthesia Post Note  Patient: GAYLAND SIVERLING  Procedure(s) Performed: Procedure(s) (LRB): URETEROSCOPY WITH HOLMIUM LASER LITHOTRIPSY (Bilateral) CYSTOSCOPY WITH STENT PLACEMENT (Bilateral)  Patient location during evaluation: PACU Anesthesia Type: General Level of consciousness: awake and alert Pain management: pain level controlled Vital Signs Assessment: post-procedure vital signs reviewed and stable Respiratory status: spontaneous breathing, nonlabored ventilation, respiratory function stable and patient connected to nasal cannula oxygen Cardiovascular status: blood pressure returned to baseline and stable Postop Assessment: no signs of nausea or vomiting Anesthetic complications: no     Last Vitals:  Vitals:   01/17/17 1131 01/17/17 1149  BP: 121/78 134/86  Pulse: 69 75  Resp: 14 13  Temp: 36.2 C     Last Pain:  Vitals:   01/17/17 1131  TempSrc: Temporal  PainSc: 4                  Jomarie Longs K Lorann Tani

## 2017-01-17 NOTE — Anesthesia Post-op Follow-up Note (Cosign Needed)
Anesthesia QCDR form completed.        

## 2017-01-17 NOTE — Anesthesia Procedure Notes (Signed)
Procedure Name: LMA Insertion Date/Time: 01/17/2017 8:41 AM Performed by: Omer Jack Pre-anesthesia Checklist: Patient identified, Patient being monitored, Timeout performed, Emergency Drugs available and Suction available Patient Re-evaluated:Patient Re-evaluated prior to inductionOxygen Delivery Method: Circle system utilized Preoxygenation: Pre-oxygenation with 100% oxygen Intubation Type: IV induction Ventilation: Mask ventilation without difficulty LMA: LMA inserted LMA Size: 4.5 Tube type: Oral Number of attempts: 1 Placement Confirmation: positive ETCO2 and breath sounds checked- equal and bilateral Tube secured with: Tape Dental Injury: Teeth and Oropharynx as per pre-operative assessment

## 2017-01-17 NOTE — Transfer of Care (Signed)
Immediate Anesthesia Transfer of Care Note  Patient: Seth Riddle  Procedure(s) Performed: Procedure(s): URETEROSCOPY WITH HOLMIUM LASER LITHOTRIPSY (Bilateral) CYSTOSCOPY WITH STENT PLACEMENT (Bilateral)  Patient Location: PACU  Anesthesia Type:General  Level of Consciousness: drowsy and patient cooperative  Airway & Oxygen Therapy: Patient Spontanous Breathing and Patient connected to face mask oxygen  Post-op Assessment: Report given to RN and Post -op Vital signs reviewed and stable  Post vital signs: Reviewed and stable  Last Vitals:  Vitals:   01/17/17 0727 01/17/17 1020  BP: 116/74 114/80  Pulse: 78 72  Resp: 18 18  Temp: 36.7 C 36.2 C    Last Pain:  Vitals:   01/17/17 1020  TempSrc:   PainSc: Asleep         Complications: No apparent anesthesia complications

## 2017-01-17 NOTE — Op Note (Signed)
Date of procedure: 01/17/17  Preoperative diagnosis:  1. Bilateral ureteral calculi   Postoperative diagnosis:  1. Bilateral ureteral calculi   Procedure: 1. Bilateral ureteroscopy 2. Bilateral retrograde pyelogram 3. Bilateral ureteral stent placement 4. Bilateral laser lithotripsy with basket extraction of Stone fragment  Surgeon: [name], MD  Anesthesia: General  Complications: None  Intraoperative findings: Severely impacted right ureteral stones at the level of the iliac 2. Unable to clear all stone burden on the side. 4 mm left distal ureteral stone easily treated.  EBL: Minimal  Specimens: Stone fragment  Drains: 6 x 26 French double-J ureteral stent bilaterally  Indication: Seth Riddle is a 56 y.o. patient with history of right ureteral stone, chronic and acute left distal ureteral stone who presents today for bilateral ureteroscopy.  After reviewing the management options for treatment, he elected to proceed with the above surgical procedure(s). We have discussed the potential benefits and risks of the procedure, side effects of the proposed treatment, the likelihood of the patient achieving the goals of the procedure, and any potential problems that might occur during the procedure or recuperation. Informed consent has been obtained.  Description of procedure:  The patient was taken to the operating room and general anesthesia was induced.  The patient was placed in the dorsal lithotomy position, prepped and draped in the usual sterile fashion, and preoperative antibiotics were administered. A preoperative time-out was performed.   A 21 French scope was advanced per urethra into the bladder. The bladder was carefully inspected and there is no evidence of lesions, tumors, or any other significant findings. His return to the left ureteral orifice which was cannulated using a 5 [place] open-ended ureteral catheter just within the orifice. Retrograde pyelogram  was performed which revealed no overt hydronephrosis or obvious filling defects. The stone was not readily seen on imaging. A sensor wire was then placed up to level of the renal pelvis without difficulty. This was snapped in place as a safety wire.  A 4.5 French semirigid ureteroscope was then advanced through the distal ureter up to level of the stone within the distal ureter. A 270  laser fiber was then used using settings of 0.8 J and 10 Hz to fragment the stone into approximately 5 smaller pieces. Each of these pieces were then extracted via 1.9 [place] nitinol basket. Once the stone was adequately cleared within the distal ureter, the scope was advanced all the way up to level approximately ureter no additional stone fragments were identified. The safety wire was then backloaded over 2 cystoscope and a 6 x 26 French double-J ureteral stent was advanced over the wire up to level of the kidney. The wire was partially withdrawn until full coil was noted within the renal pelvis. Were then fully withdrawn and a full coil was noted within the bladder.  Attention was then turned to the right ureter. 5 [place] open-ended ureteral catheter was used again on this side and a retrograde pyelogram was performed. This revealed a high-grade obstruction without any contrast material refluxing above the level of the stones at the level of the iliacs. Ultimately, a sensor wire was able to be advanced with some some manipulation up to level of the kidney beyond the stones. This was snapped in place as a safety wire. A 4.5 French semirigid ureteroscope was then advanced up to level of the first 5 mm distal ureteral stone at the level just below the iliacs. At this level, there was significant ureteral edema in the  stone appeared almost mucosalized with a thin layer of mucosa material lying calculus.  Careful and judicious use of laser fiber was then used to fragment the stone which was somewhat difficult given the very impacted  stone.  Ultimately, I was able to clear all stone burden basket all fragments from the stone. Due to the severe edema and bleeding, I had difficulty initially advancing the scope to the next oh. Several retrograde pyelogram circumflex probed to ensure that there was no extravasation of contrast which was negative. Finally, I did see some contrast effluxed above the level of the stone with severely dilated ureter to the level of the stone. I tempted on several attempts to pass a wire beyond the stone under direct visualization was unsuccessful due to the degree of impaction. I was able to coil the wire just distal to the stone and advanced the scope up to this level. Ultimately, I was able to fragment approximately 50% of the stone but the portion embedded into the wall was very difficult to visualize, required some torquing of the scope.  Point, I elected to place a stent to help decompress the proximal ureter as well as hopefully dilate the the ureter near the level of the stone and reduce the ureteral edema to help facilitate more significant stone fragmentation. The safety wire was backloaded over the rigid ureteroscope no stent was placed in the usual fashion as previously described. The bladder was then drained and fragments were collected and sent for specimen. The patient was then cleaned and dried, repositioned the supine position, reversed from anesthesia, taken to the PACU in stable condition.  Plan: I'll bring the patient back in approximately 2 weeks for staged right ureteroscopy. We will plan on removing his left ureteral stent that time. Findings were discussed today with the patient's wife, father, and the patient himself. All questions were answered.   [name], M.D.

## 2017-01-17 NOTE — Interval H&P Note (Signed)
History and Physical Interval Note:  01/17/2017 7:22 AM  Seth Riddle  has presented today for surgery, with the diagnosis of bilateral ureteral stone  The various methods of treatment have been discussed with the patient and family. After consideration of risks, benefits and other options for treatment, the patient has consented to  Procedure(s): URETEROSCOPY WITH HOLMIUM LASER LITHOTRIPSY (Bilateral) CYSTOSCOPY WITH STENT PLACEMENT (Bilateral) as a surgical intervention .  The patient's history has been reviewed, patient examined, no change in status, stable for surgery.  I have reviewed the patient's chart and labs.  Questions were answered to the patient's satisfaction.    RRR CTAB  [name]

## 2017-01-17 NOTE — Anesthesia Preprocedure Evaluation (Signed)
Anesthesia Evaluation  Patient identified by MRN, date of birth, ID band Patient awake    Reviewed: Allergy & Precautions, H&P , NPO status , Patient's Chart, lab work & pertinent test results  History of Anesthesia Complications (+) PONV and history of anesthetic complications  Airway Mallampati: III  TM Distance: >3 FB Neck ROM: full    Dental  (+) Poor Dentition, Chipped, Caps   Pulmonary neg pulmonary ROS, neg shortness of breath,    Pulmonary exam normal breath sounds clear to auscultation       Cardiovascular Exercise Tolerance: Good hypertension, (-) angina+ CAD, + Past MI and +CHF  (-) DOE Normal cardiovascular exam Rhythm:regular Rate:Normal     Neuro/Psych  Headaches, negative psych ROS   GI/Hepatic Neg liver ROS, GERD  Medicated and Controlled,  Endo/Other  diabetes, Type 2  Renal/GU      Musculoskeletal   Abdominal   Peds  Hematology negative hematology ROS (+)   Anesthesia Other Findings Past Medical History: No date: Coronary artery disease No date: Diabetes mellitus without complication (HCC) No date: Dyslipidemia (high LDL; low HDL) No date: Family history of premature CAD No date: Headache No date: History of kidney stones No date: Low HDL (under 40) 2016: MI (myocardial infarction) (HCC) No date: PONV (postoperative nausea and vomiting)     Comment: as a child eye surgery  Past Surgical History: 11/15/2014: CORONARY ARTERY BYPASS GRAFT N/A     Comment: Procedure: CORONARY ARTERY BYPASS GRAFTING               (CABG), ON PUMP, TIMES THREE, USING LEFT               INTERNAL MAMMARY ARTERY, RIGHT GREATER               SAPHENOUS VEIN HARVESTED ENDOSCOPICALLY;                Surgeon: Loreli Slot, MD;  Location:               [facility] OR;  Service: Open Heart Surgery;                Laterality: N/A; No date: EYE SURGERY     Comment: multiple eye surgeries as a child 11/15/2014:  INTRA-AORTIC BALLOON PUMP INSERTION N/A     Comment: Procedure: INTRA-AORTIC BALLOON PUMP               INSERTION;  Surgeon: Lennette Bihari, MD;                Location: [facility] CATH LAB;  Service:               Cardiovascular;  Laterality: N/A; 11/14/2014: LEFT HEART CATHETERIZATION WITH CORONARY ANGIO* N/A     Comment: Procedure: LEFT HEART CATHETERIZATION WITH               CORONARY ANGIOGRAM;  Surgeon: Lennette Bihari,               MD;  Location: [facility] CATH LAB;  Service:               Cardiovascular;  Laterality: N/A; 11/15/2014: PERCUTANEOUS CORONARY STENT INTERVENTION (PCI-* N/A     Comment: Procedure: PERCUTANEOUS CORONARY STENT               INTERVENTION (PCI-S);  Surgeon: Lennette Bihari,              MD;  Location: [facility] CATH LAB;  Service:  Cardiovascular;  Laterality: N/A; No date: STOMACH SURGERY     Comment: at 21 weeks old 11/15/2014: TEE WITHOUT CARDIOVERSION N/A     Comment: Procedure: TRANSESOPHAGEAL ECHOCARDIOGRAM               (TEE);  Surgeon: Loreli Slot, MD;                Location: [facility] OR;  Service: Open Heart Surgery;               Laterality: N/A;     Reproductive/Obstetrics negative OB ROS                             Anesthesia Physical Anesthesia Plan  ASA: III  Anesthesia Plan: General LMA   Post-op Pain Management:    Induction: Intravenous  Airway Management Planned: LMA  Additional Equipment:   Intra-op Plan:   Post-operative Plan: Extubation in OR  Informed Consent: I have reviewed the patients History and Physical, chart, labs and discussed the procedure including the risks, benefits and alternatives for the proposed anesthesia with the patient or authorized representative who has indicated his/her understanding and acceptance.   Dental Advisory Given  Plan Discussed with: Anesthesiologist, CRNA and Surgeon  Anesthesia Plan Comments: (Patient has cardiac clearance for this procedure.   Patient  consented for risks of anesthesia including but not limited to:  - adverse reactions to medications - damage to teeth, lips or other oral mucosa - sore throat or hoarseness - Damage to heart, brain, lungs or loss of life  Patient voiced understanding.)        Anesthesia Quick Evaluation

## 2017-01-17 NOTE — Discharge Instructions (Signed)
You have a ureteral stent in place.  This is a tube that extends from your kidney to your bladder.  This may cause urinary bleeding, burning with urination, and urinary frequency.  Please call our office or present to the ED if you develop fevers >101 or pain which is not able to be controlled with oral pain medications.  You may be given either Flomax and/ or ditropan to help with bladder spasms and stent pain in addition to pain medications.    [facility] [address] [place], [place] [address] [phone]  General Anesthesia, Adult, Care After These instructions provide you with information about caring for yourself after your procedure. Your health care provider may also give you more specific instructions. Your treatment has been planned according to current medical practices, but problems sometimes occur. Call your health care provider if you have any problems or questions after your procedure. What can I expect after the procedure? After the procedure, it is common to have:  Vomiting.  A sore throat.  Mental slowness.  It is common to feel:  Nauseous.  Cold or shivery.  Sleepy.  Tired.  Sore or achy, even in parts of your body where you did not have surgery.  Follow these instructions at home: For at least 24 hours after the procedure:  Do not: ? Participate in activities where you could fall or become injured. ? Drive. ? Use heavy machinery. ? Drink alcohol. ? Take sleeping pills or medicines that cause drowsiness. ? Make important decisions or sign legal documents. ? Take care of children on your own.  Rest. Eating and drinking  If you vomit, drink water, juice, or soup when you can drink without vomiting.  Drink enough fluid to keep your urine clear or pale yellow.  Make sure you have little or no nausea before eating solid foods.  Follow the diet recommended by your health care provider. General  instructions  Have a responsible adult stay with you until you are awake and alert.  Return to your normal activities as told by your health care provider. Ask your health care provider what activities are safe for you.  Take over-the-counter and prescription medicines only as told by your health care provider.  If you smoke, do not smoke without supervision.  Keep all follow-up visits as told by your health care provider. This is important. Contact a health care provider if:  You continue to have nausea or vomiting at home, and medicines are not helpful.  You cannot drink fluids or start eating again.  You cannot urinate after 8-12 hours.  You develop a skin rash.  You have fever.  You have increasing redness at the site of your procedure. Get help right away if:  You have difficulty breathing.  You have chest pain.  You have unexpected bleeding.  You feel that you are having a life-threatening or urgent problem. This information is not intended to replace advice given to you by your health care provider. Make sure you discuss any questions you have with your health care provider. Document Released: 11/11/2000 Document Revised: 01/08/2016 Document Reviewed: 07/20/2015 Elsevier Interactive Patient Education  [employer].

## 2017-01-21 ENCOUNTER — Telehealth: Payer: Self-pay | Admitting: Radiology

## 2017-01-21 NOTE — Telephone Encounter (Signed)
Notified pt of ureteroscopy with laser lithotripsy scheduled with Dr Apolinar Junes on 02/04/17, to be npo after mn prior to surgery & to follow instructions given to pt at pre-admit testing for prior surgery with Dr Apolinar Junes. Advised pt to call day prior to surgery for arrival time to SDS. Pt's questions were answered & no further questions at this time. Pt voices understanding.

## 2017-01-22 ENCOUNTER — Other Ambulatory Visit: Payer: Self-pay | Admitting: Radiology

## 2017-01-22 DIAGNOSIS — N201 Calculus of ureter: Secondary | ICD-10-CM

## 2017-01-23 ENCOUNTER — Telehealth: Payer: Self-pay | Admitting: Radiology

## 2017-01-23 NOTE — Telephone Encounter (Signed)
Pt's wife called the after hours nurse line stating pt is having discomfort on the right side & lower back & is also very bloated. Spoke with pt who stated he was told to call the office if he had any problems & he just wanted to report that he was having moderate right side & lower back pain with bloating. Reassured pt that this was common with a ureteral stent in place & that he could take Tylenol or ibuprofen as needed for mild pain or percocet for severe pain. Pt states he has plenty of percocet left as he has not taken it often. Also encouraged pt to increase water intake & may use oxybutynin to help with bladder spasms. Pt has no further questions at this time & voices understanding.

## 2017-01-24 LAB — STONE ANALYSIS
CA OXALATE, MONOHYDR.: 65 %
CA PHOS CRY STONE QL IR: 5 %
Ca Oxalate,Dihydrate: 30 %
STONE WEIGHT KSTONE: 31.7 mg

## 2017-01-30 ENCOUNTER — Telehealth: Payer: Self-pay | Admitting: Radiology

## 2017-01-30 NOTE — Telephone Encounter (Signed)
Discussed with patient. Unfortunately, there is no alternatives other than surgical intervention to go back and get the remainder of the stone. It'll be risky to take out the stent with an obstructing calculus still present due to concern for sepsis and obstruction. Given the embedded nature of the stone, shockwave lithotripsy is not a good option.  I have encouraged him to call the Hospital work out a payment plan if he is unable to afford the payments.  [name], MD

## 2017-01-30 NOTE — Telephone Encounter (Signed)
Pt would like to talk about alternative options regarding surgery planned for 02/04/17 due to cost of surgery. Please return pt's call at [phone].

## 2017-02-03 MED ORDER — SODIUM CHLORIDE 0.9 % IV SOLN
1.0000 g | INTRAVENOUS | Status: AC
  Administered 2017-02-04: 1 g via INTRAVENOUS
  Filled 2017-02-03: qty 1000

## 2017-02-03 MED ORDER — GENTAMICIN IN SALINE 1.6-0.9 MG/ML-% IV SOLN
80.0000 mg | INTRAVENOUS | Status: DC
  Filled 2017-02-03: qty 50

## 2017-02-04 ENCOUNTER — Ambulatory Visit: Payer: BC Managed Care – PPO | Admitting: Anesthesiology

## 2017-02-04 ENCOUNTER — Ambulatory Visit
Admission: RE | Admit: 2017-02-04 | Discharge: 2017-02-04 | Disposition: A | Discharge status: Home / Self Care | Payer: BC Managed Care – PPO | Source: Ambulatory Visit | Attending: Urology | Admitting: Urology

## 2017-02-04 ENCOUNTER — Encounter: Payer: Self-pay | Admitting: *Deleted

## 2017-02-04 ENCOUNTER — Encounter
Admission: RE | Disposition: A | Discharge status: Home / Self Care | Payer: Self-pay | Source: Ambulatory Visit | Attending: Urology

## 2017-02-04 ENCOUNTER — Other Ambulatory Visit: Payer: Self-pay

## 2017-02-04 DIAGNOSIS — Z7982 Long term (current) use of aspirin: Secondary | ICD-10-CM | POA: Insufficient documentation

## 2017-02-04 DIAGNOSIS — I2510 Atherosclerotic heart disease of native coronary artery without angina pectoris: Secondary | ICD-10-CM | POA: Insufficient documentation

## 2017-02-04 DIAGNOSIS — E785 Hyperlipidemia, unspecified: Secondary | ICD-10-CM | POA: Diagnosis not present

## 2017-02-04 DIAGNOSIS — Z87442 Personal history of urinary calculi: Secondary | ICD-10-CM | POA: Insufficient documentation

## 2017-02-04 DIAGNOSIS — I252 Old myocardial infarction: Secondary | ICD-10-CM | POA: Diagnosis not present

## 2017-02-04 DIAGNOSIS — K760 Fatty (change of) liver, not elsewhere classified: Secondary | ICD-10-CM | POA: Diagnosis not present

## 2017-02-04 DIAGNOSIS — Z951 Presence of aortocoronary bypass graft: Secondary | ICD-10-CM | POA: Insufficient documentation

## 2017-02-04 DIAGNOSIS — E119 Type 2 diabetes mellitus without complications: Secondary | ICD-10-CM | POA: Insufficient documentation

## 2017-02-04 DIAGNOSIS — N132 Hydronephrosis with renal and ureteral calculous obstruction: Secondary | ICD-10-CM | POA: Diagnosis not present

## 2017-02-04 DIAGNOSIS — N201 Calculus of ureter: Secondary | ICD-10-CM | POA: Diagnosis not present

## 2017-02-04 DIAGNOSIS — Z7984 Long term (current) use of oral hypoglycemic drugs: Secondary | ICD-10-CM | POA: Insufficient documentation

## 2017-02-04 HISTORY — PX: CYSTOSCOPY W/ URETERAL STENT PLACEMENT: SHX1429

## 2017-02-04 HISTORY — PX: URETEROSCOPY WITH HOLMIUM LASER LITHOTRIPSY: SHX6645

## 2017-02-04 HISTORY — PX: CYSTOSCOPY W/ URETERAL STENT REMOVAL: SHX1430

## 2017-02-04 LAB — GLUCOSE, CAPILLARY: Glucose-Capillary: 124 mg/dL — ABNORMAL HIGH (ref 65–99)

## 2017-02-04 SURGERY — URETEROSCOPY, WITH LITHOTRIPSY USING HOLMIUM LASER
Anesthesia: General | Laterality: Right

## 2017-02-04 MED ORDER — DEXAMETHASONE SODIUM PHOSPHATE 10 MG/ML IJ SOLN
INTRAMUSCULAR | Status: DC | PRN
  Administered 2017-02-04: 10 mg via INTRAVENOUS

## 2017-02-04 MED ORDER — OXYCODONE-ACETAMINOPHEN 5-325 MG PO TABS: 1.0000 | ORAL_TABLET | ORAL | 0 refills | Status: DC | PRN

## 2017-02-04 MED ORDER — TAMSULOSIN HCL 0.4 MG PO CAPS: 0.4000 mg | ORAL_CAPSULE | Freq: Every day | ORAL | 2 refills | Status: DC

## 2017-02-04 MED ORDER — FENTANYL CITRATE (PF) 100 MCG/2ML IJ SOLN
INTRAMUSCULAR | Status: DC | PRN
  Administered 2017-02-04 (×2): 25 ug via INTRAVENOUS
  Administered 2017-02-04: 50 ug via INTRAVENOUS

## 2017-02-04 MED ORDER — ONDANSETRON HCL 4 MG/2ML IJ SOLN
INTRAMUSCULAR | Status: DC | PRN
  Administered 2017-02-04: 4 mg via INTRAVENOUS

## 2017-02-04 MED ORDER — OXYCODONE-ACETAMINOPHEN 5-325 MG PO TABS
ORAL_TABLET | ORAL | Status: AC
  Administered 2017-02-04: 1 via ORAL
  Filled 2017-02-04: qty 1

## 2017-02-04 MED ORDER — ONDANSETRON HCL 4 MG/2ML IJ SOLN: 4.0000 mg | Freq: Once | INTRAMUSCULAR | Status: DC | PRN

## 2017-02-04 MED ORDER — ONDANSETRON HCL 4 MG/2ML IJ SOLN
INTRAMUSCULAR | Status: AC
  Filled 2017-02-04: qty 2

## 2017-02-04 MED ORDER — PROPOFOL 10 MG/ML IV BOLUS
INTRAVENOUS | Status: AC
  Filled 2017-02-04: qty 20

## 2017-02-04 MED ORDER — SUCCINYLCHOLINE CHLORIDE 20 MG/ML IJ SOLN
INTRAMUSCULAR | Status: AC
  Filled 2017-02-04: qty 1

## 2017-02-04 MED ORDER — FAMOTIDINE 20 MG PO TABS
20.0000 mg | ORAL_TABLET | Freq: Once | ORAL | Status: AC
Start: 2017-02-04 — End: 2017-02-04
  Administered 2017-02-04: 20 mg via ORAL

## 2017-02-04 MED ORDER — OXYCODONE-ACETAMINOPHEN 5-325 MG PO TABS
1.0000 | ORAL_TABLET | ORAL | Status: DC | PRN
  Administered 2017-02-04: 1 via ORAL

## 2017-02-04 MED ORDER — EPHEDRINE SULFATE 50 MG/ML IJ SOLN
INTRAMUSCULAR | Status: AC
  Filled 2017-02-04: qty 1

## 2017-02-04 MED ORDER — SODIUM CHLORIDE 0.9 % IV SOLN
INTRAVENOUS | Status: DC
  Administered 2017-02-04: 07:00:00 via INTRAVENOUS

## 2017-02-04 MED ORDER — FAMOTIDINE 20 MG PO TABS
ORAL_TABLET | ORAL | Status: AC
  Administered 2017-02-04: 20 mg via ORAL
  Filled 2017-02-04: qty 1

## 2017-02-04 MED ORDER — DEXAMETHASONE SODIUM PHOSPHATE 10 MG/ML IJ SOLN
INTRAMUSCULAR | Status: AC
  Filled 2017-02-04: qty 1

## 2017-02-04 MED ORDER — ROCURONIUM BROMIDE 50 MG/5ML IV SOLN
INTRAVENOUS | Status: AC
  Filled 2017-02-04: qty 1

## 2017-02-04 MED ORDER — DEXTROSE 5 % IV SOLN
INTRAVENOUS | Status: AC
  Administered 2017-02-04: 80 mL via INTRAVENOUS
  Filled 2017-02-04: qty 50

## 2017-02-04 MED ORDER — FENTANYL CITRATE (PF) 100 MCG/2ML IJ SOLN
INTRAMUSCULAR | Status: AC
  Filled 2017-02-04: qty 2

## 2017-02-04 MED ORDER — SUGAMMADEX SODIUM 500 MG/5ML IV SOLN
INTRAVENOUS | Status: AC
  Filled 2017-02-04: qty 5

## 2017-02-04 MED ORDER — MIDAZOLAM HCL 2 MG/2ML IJ SOLN
INTRAMUSCULAR | Status: DC | PRN
  Administered 2017-02-04: 2 mg via INTRAVENOUS

## 2017-02-04 MED ORDER — PHENYLEPHRINE HCL 10 MG/ML IJ SOLN
INTRAMUSCULAR | Status: AC
  Filled 2017-02-04: qty 1

## 2017-02-04 MED ORDER — FENTANYL CITRATE (PF) 100 MCG/2ML IJ SOLN: 25.0000 ug | INTRAMUSCULAR | Status: DC | PRN

## 2017-02-04 MED ORDER — MIDAZOLAM HCL 2 MG/2ML IJ SOLN
INTRAMUSCULAR | Status: AC
  Filled 2017-02-04: qty 2

## 2017-02-04 MED ORDER — PROPOFOL 10 MG/ML IV BOLUS
INTRAVENOUS | Status: DC | PRN
  Administered 2017-02-04: 150 mg via INTRAVENOUS

## 2017-02-04 SURGICAL SUPPLY — 30 items
BAG DRAIN CYSTO-URO LG1000N (MISCELLANEOUS) ×4 IMPLANT
BASKET ZERO TIP 1.9FR (BASKET) ×4 IMPLANT
CATH URETL 5X70 OPEN END (CATHETERS) ×4 IMPLANT
CNTNR SPEC 2.5X3XGRAD LEK (MISCELLANEOUS)
CONRAY 43 FOR UROLOGY 50M (MISCELLANEOUS) ×4 IMPLANT
CONT SPEC 4OZ STER OR WHT (MISCELLANEOUS)
CONTAINER SPEC 2.5X3XGRAD LEK (MISCELLANEOUS) IMPLANT
DRAPE UTILITY 15X26 TOWEL STRL (DRAPES) ×4 IMPLANT
FIBER LASER LITHO 273 (Laser) ×4 IMPLANT
GLOVE BIO SURGEON STRL SZ 6.5 (GLOVE) ×3 IMPLANT
GLOVE BIO SURGEONS STRL SZ 6.5 (GLOVE) ×1
GOWN STRL REUS W/ TWL LRG LVL3 (GOWN DISPOSABLE) ×4 IMPLANT
GOWN STRL REUS W/TWL LRG LVL3 (GOWN DISPOSABLE) ×4
GUIDEWIRE GREEN .038 145CM (MISCELLANEOUS) ×4 IMPLANT
INFUSOR MANOMETER BAG 3000ML (MISCELLANEOUS) ×4 IMPLANT
INTRODUCER DILATOR DOUBLE (INTRODUCER) IMPLANT
KIT RM TURNOVER CYSTO AR (KITS) ×4 IMPLANT
PACK CYSTO AR (MISCELLANEOUS) ×4 IMPLANT
SCRUB POVIDONE IODINE 4 OZ (MISCELLANEOUS) IMPLANT
SENSORWIRE 0.038 NOT ANGLED (WIRE) ×4
SET CYSTO W/LG BORE CLAMP LF (SET/KITS/TRAYS/PACK) ×4 IMPLANT
SHEATH URETERAL 12FRX35CM (MISCELLANEOUS) IMPLANT
SOL .9 NS 3000ML IRR  AL (IV SOLUTION) ×2
SOL .9 NS 3000ML IRR UROMATIC (IV SOLUTION) ×2 IMPLANT
STENT URET 6FRX24 CONTOUR (STENTS) IMPLANT
STENT URET 6FRX26 CONTOUR (STENTS) ×4 IMPLANT
SURGILUBE 2OZ TUBE FLIPTOP (MISCELLANEOUS) ×4 IMPLANT
SYRINGE IRR TOOMEY STRL 70CC (SYRINGE) ×4 IMPLANT
WATER STERILE IRR 1000ML POUR (IV SOLUTION) ×4 IMPLANT
WIRE SENSOR 0.038 NOT ANGLED (WIRE) ×2 IMPLANT

## 2017-02-04 NOTE — Op Note (Signed)
Date of procedure: 02/04/17  Preoperative diagnosis:  1. Right impacted ureteral stones    Postoperative diagnosis:  1. Same as above   Procedure: 1. Left ureteral stent removal 2. Right ureteroscopy 3. Laser lithotripsy with basket extraction of stone 4. Right ureteral stent exchange  Surgeon: [name], MD  Anesthesia: General  Complications: None  Intraoperative findings: Additional impacted left distal ureteral stones removed, partially embedded into wall of ureter. All stone burden satisfactorily removed at the end of the case. Ureteral edema and mild narrowing appreciated.  EBL: Minimal  Specimens: None  Drains: 6 x 26 French which a ureteral stent on right  Indication: Seth Riddle is a 56 y.o. patient with a history of bilateral ureteral calculi status post bilateral ureteroscopy with residual embedded/impacted right distal ureteral stone who returns today for second stage ureteroscopy..  After reviewing the management options for treatment, he elected to proceed with the above surgical procedure(s). We have discussed the potential benefits and risks of the procedure, side effects of the proposed treatment, the likelihood of the patient achieving the goals of the procedure, and any potential problems that might occur during the procedure or recuperation. Informed consent has been obtained.  Description of procedure:  The patient was taken to the operating room and general anesthesia was induced.  The patient was placed in the dorsal lithotomy position, prepped and draped in the usual sterile fashion, and preoperative antibiotics were administered. A preoperative time-out was performed.   A 21 French scope was advanced per urethra into the bladder. Attention was turned to the left ureteral orifice from which a ureteral stent was seen emanating. The distal coil of the stent was grasped and the stent was removed. The same procedure was performed on the right side  grasping the distal coil bringing to level urethral meatus. The stent was then cannulated using a sensor wire up to level of the kidney. The wire stopped in place as a safety wire. 4.5 semirigid ureteroscope was then advanced to the distal ureter up to level of the impacted stone. There is a few small fragments which were free floating. These were extracted using a 1.9 [place] to plus nitinol basket. A 273  laser fiber was then utilized using the settings of 0.8 J and 10 Hz to fragment the stone and the scope beak was used to gently manipulate the stone to dislodge it from the wall of the ureter. Eventually, I was able to remove all visible stone burden. The scope was passed just beyond the level of the embedded stone and no residual stone fragment was identified. To ensure that there was no retropulsion of Stone fragment, a Super Stiff wire was introduced through the ureteroscope up to level of the kidney. The semirigid ureteroscope was then exchanged for a 7 French flexible ureteroscope which was advanced level of the kidney easily. The scope was then slowly backed down the length of the ureter inspecting along the way. No residual stone fragments were identified. There was fairly significant edema at the level of the impacted ureteral stone and some mild narrowing at this level of the ureter, I was able to be easily traversed. The scope was then removed. The safety wire was then backloaded over a rigid cystoscope. A 6 x 26 French double-J ureteral stent was advanced over the wire up to level of the kidney. The wire was partially drawn until full coil was noted within the renal pelvis. The wire simply withdrawn and a full coil was noted  within the bladder. The bladder was then drained. The scope was removed. The patient was then cleaned and dried, repositioned supine position, reversed from anesthesia, and taken the PACU in stable condition.  Plan: Patient will follow-up in 2 weeks for cystoscopy, stent  removal.  [name], M.D.

## 2017-02-04 NOTE — Anesthesia Preprocedure Evaluation (Signed)
Anesthesia Evaluation  Patient identified by MRN, date of birth, ID band Patient awake    Reviewed: Allergy & Precautions, NPO status , Patient's Chart, lab work & pertinent test results  History of Anesthesia Complications (+) PONV and history of anesthetic complications (PONV as a child)  Airway Mallampati: II       Dental   Pulmonary           Cardiovascular hypertension, Pt. on medications and Pt. on home beta blockers + CAD, + Past MI, + CABG and +CHF       Neuro/Psych    GI/Hepatic negative GI ROS, Neg liver ROS,   Endo/Other  diabetes (borderline)  Renal/GU Renal disease (stones)     Musculoskeletal   Abdominal   Peds  Hematology   Anesthesia Other Findings   Reproductive/Obstetrics                             Anesthesia Physical Anesthesia Plan  ASA: III  Anesthesia Plan: General   Post-op Pain Management:    Induction:   PONV Risk Score and Plan: 2 and Ondansetron and Dexamethasone  Airway Management Planned: Oral ETT  Additional Equipment:   Intra-op Plan:   Post-operative Plan:   Informed Consent: I have reviewed the patients History and Physical, chart, labs and discussed the procedure including the risks, benefits and alternatives for the proposed anesthesia with the patient or authorized representative who has indicated his/her understanding and acceptance.     Plan Discussed with:   Anesthesia Plan Comments:         Anesthesia Quick Evaluation

## 2017-02-04 NOTE — Progress Notes (Signed)
Patient request for follow up visit to be July 6 instead of July 3 due to vacation. Dr. Apolinar Junes was informed and permission granted for stent to remain in until July 6.

## 2017-02-04 NOTE — Anesthesia Post-op Follow-up Note (Cosign Needed)
Anesthesia QCDR form completed.        

## 2017-02-04 NOTE — Discharge Instructions (Signed)
You have a ureteral stent in place.  This is a tube that extends from your kidney to your bladder.  This may cause urinary bleeding, burning with urination, and urinary frequency.  Please call our office or present to the ED if you develop fevers >101 or pain which is not able to be controlled with oral pain medications.  You may be given either Flomax and/ or ditropan to help with bladder spasms and stent pain in addition to pain medications.    [facility] [address] [place], [place] [address] [phone]     AMBULATORY SURGERY  DISCHARGE INSTRUCTIONS   1) The drugs that you were given will stay in your system until tomorrow so for the next 24 hours you should not:  A) Drive an automobile B) Make any legal decisions C) Drink any alcoholic beverage   2) You may resume regular meals tomorrow.  Today it is better to start with liquids and gradually work up to solid foods.  You may eat anything you prefer, but it is better to start with liquids, then soup and crackers, and gradually work up to solid foods.   3) Please notify your doctor immediately if you have any unusual bleeding, trouble breathing, redness and pain at the surgery site, drainage, fever, or pain not relieved by medication.

## 2017-02-04 NOTE — Interval H&P Note (Signed)
History and Physical Interval Note:  02/04/2017 7:24 AM  Seth Riddle  has presented today for surgery, with the diagnosis of bilateral ureteral stones  The various methods of treatment have been discussed with the patient and family. After consideration of risks, benefits and other options for treatment, the patient has consented to  Procedure(s): URETEROSCOPY WITH HOLMIUM LASER LITHOTRIPSY (Right) CYSTOSCOPY WITH STENT REPLACEMENT (Right) as a surgical intervention .  The patient's history has been reviewed, patient examined, no change in status, stable for surgery.  I have reviewed the patient's chart and labs.  Questions were answered to the patient's satisfaction.    RRR cTAB  REturns today for staged right sided procedure to address residual stone burden.    [name]

## 2017-02-04 NOTE — Transfer of Care (Signed)
Immediate Anesthesia Transfer of Care Note  Patient: TAVAREZ AFONSO  Procedure(s) Performed: Procedure(s): URETEROSCOPY WITH HOLMIUM LASER LITHOTRIPSY (Right) CYSTOSCOPY WITH STENT REPLACEMENT (Right) CYSTOSCOPY WITH STENT REMOVAL (Left)  Patient Location: PACU  Anesthesia Type:General  Level of Consciousness: drowsy and patient cooperative  Airway & Oxygen Therapy: Patient Spontanous Breathing and Patient connected to face mask oxygen  Post-op Assessment: Report given to RN and Post -op Vital signs reviewed and stable  Post vital signs: Reviewed and stable  Last Vitals:  Vitals:   02/04/17 0612 02/04/17 0855  BP: 119/85 119/79  Pulse: 85 77  Resp: 16 16  Temp: 36.2 C 36.6 C    Last Pain:  Vitals:   02/04/17 0612  TempSrc: Oral         Complications: No apparent anesthesia complications

## 2017-02-04 NOTE — Anesthesia Postprocedure Evaluation (Signed)
Anesthesia Post Note  Patient: Seth Riddle  Procedure(s) Performed: Procedure(s) (LRB): URETEROSCOPY WITH HOLMIUM LASER LITHOTRIPSY (Right) CYSTOSCOPY WITH STENT REPLACEMENT (Right) CYSTOSCOPY WITH STENT REMOVAL (Left)  Patient location during evaluation: PACU Anesthesia Type: General Level of consciousness: awake and alert Pain management: pain level controlled Vital Signs Assessment: post-procedure vital signs reviewed and stable Respiratory status: spontaneous breathing and respiratory function stable Cardiovascular status: stable Anesthetic complications: no     Last Vitals:  Vitals:   02/04/17 0855 02/04/17 0910  BP: 119/79 107/72  Pulse: 77 72  Resp: 16 13  Temp: 36.6 C     Last Pain:  Vitals:   02/04/17 0910  TempSrc:   PainSc: 4                  Zilphia Kozinski K

## 2017-02-04 NOTE — H&P (View-Only) (Signed)
01/16/2017 3:56 PM   Seth Riddle 1961-06-05 [id number]  Referring provider: [name], MD [phone] [address] [facility] [place], [place] [address]  CC: flank pain  HPI: 56 year old male with bilateral partially obstructing ureteral calculi, 4 mm left UVJ stone along with 25 mm right distal ureteral stones (mild hydro) at the level of the iliac with moderate hydroureteronephrosis on the side.  He initially was seen and evaluated last year in 04/2016 for a 7 mm right UPJ stone with moderate hydronephrosis. ESWL was booked that he ultimately elected to cancel this procedure due to concern for cost. He was counseled to return for follow-up and also declined a follow-up AGAINST MEDICAL ADVICE.  He returned to the office on 01/03/2017 with acute onset severe left flank pain. We are able to control his pain. Stone was not able to be identified on KUB that day. He underwent follow-up renal ultrasound 2 days ago which showed moderate right and mild left hydronephrosis concerning for bilateral stones. He underwent stat CT scan the following day indicating the above.   There is also a 5.8 cm right lower pole renal cyst which is not fully characterize a noncontrast CT scan.    He denies any fevers or chills.  No difficulties urinating. No gross hematuria or dysuria.  His pain on the left side has now resolved to have occasional dull ache but no severe pain.  History of CAD s/p CABG on ASA.   PMH: Past Medical History:  Diagnosis Date  . Coronary artery disease   . Diabetes mellitus without complication (HCC)   . Dyslipidemia (high LDL; low HDL)   . Family history of premature CAD   . Headache   . History of kidney stones   . Low HDL (under 40)   . MI (myocardial infarction) (HCC) 2016  . PONV (postoperative nausea and vomiting)    as a child eye surgery    Surgical History: Past Surgical History:  Procedure Laterality Date  . CORONARY  ARTERY BYPASS GRAFT N/A 11/15/2014   Procedure: CORONARY ARTERY BYPASS GRAFTING (CABG), ON PUMP, TIMES THREE, USING LEFT INTERNAL MAMMARY ARTERY, RIGHT GREATER SAPHENOUS VEIN HARVESTED ENDOSCOPICALLY;  Surgeon: Loreli Slot, MD;  Location: [facility] OR;  Service: Open Heart Surgery;  Laterality: N/A;  . EYE SURGERY     multiple eye surgeries as a child  . INTRA-AORTIC BALLOON PUMP INSERTION N/A 11/15/2014   Procedure: INTRA-AORTIC BALLOON PUMP INSERTION;  Surgeon: Lennette Bihari, MD;  Location: [facility] CATH LAB;  Service: Cardiovascular;  Laterality: N/A;  . LEFT HEART CATHETERIZATION WITH CORONARY ANGIOGRAM N/A 11/14/2014   Procedure: LEFT HEART CATHETERIZATION WITH CORONARY ANGIOGRAM;  Surgeon: Lennette Bihari, MD;  Location: [facility] CATH LAB;  Service: Cardiovascular;  Laterality: N/A;  . PERCUTANEOUS CORONARY STENT INTERVENTION (PCI-S) N/A 11/15/2014   Procedure: PERCUTANEOUS CORONARY STENT INTERVENTION (PCI-S);  Surgeon: Lennette Bihari, MD;  Location: [facility] CATH LAB;  Service: Cardiovascular;  Laterality: N/A;  . STOMACH SURGERY     at 50 weeks old  . TEE WITHOUT CARDIOVERSION N/A 11/15/2014   Procedure: TRANSESOPHAGEAL ECHOCARDIOGRAM (TEE);  Surgeon: Loreli Slot, MD;  Location: [facility] OR;  Service: Open Heart Surgery;  Laterality: N/A;    Home Medications:  Allergies as of 01/16/2017      Reactions   Coconut Oil Hives      Medication List       Accurate as of 01/16/17  3:56 PM. Always use your  most recent med list.          aspirin EC 81 MG tablet Take 1 tablet (81 mg total) by mouth daily.   lisinopril 5 MG tablet Commonly known as:  PRINIVIL,ZESTRIL Take 1 tablet (5 mg total) by mouth daily.   metFORMIN 500 MG tablet Commonly known as:  GLUCOPHAGE Take 500 mg by mouth 2 (two) times daily with a meal.   metoprolol tartrate 25 MG tablet Commonly known as:  LOPRESSOR Take 1 tablet (25 mg total) by mouth 2 (two) times daily.   ondansetron 4 MG tablet Commonly known as:   ZOFRAN Take 1 tablet (4 mg total) by mouth every 8 (eight) hours as needed for nausea or vomiting.   oxyCODONE-acetaminophen 5-325 MG tablet Commonly known as:  PERCOCET Take 1-2 tablets by mouth every 4 (four) hours as needed for moderate pain or severe pain.   rosuvastatin 20 MG tablet Commonly known as:  CRESTOR TAKE 1 TABLET BY MOUTH EVERY DAY   tamsulosin 0.4 MG Caps capsule Commonly known as:  FLOMAX Take 1 capsule (0.4 mg total) by mouth daily.   traMADol 50 MG tablet Commonly known as:  ULTRAM Take 50 mg by mouth every 6 (six) hours as needed for pain.       Allergies:  Allergies  Allergen Reactions  . Coconut Oil Hives    Family History: Family History  Problem Relation Age of Onset  . Heart disease Mother   . Stroke Mother   . Nephrolithiasis Mother   . Heart attack Father   . Stroke Father   . Kidney cancer Neg Hx   . Bladder Cancer Neg Hx   . Prostate cancer Neg Hx     Social History:  reports that he has never smoked. He has never used smokeless tobacco. He reports that he drinks alcohol. He reports that he does not use drugs.  ROS: 12 point review of systems was performed and is negative other than as per history of present illness.  Physical Exam: BP 112/76   Pulse 76   Ht 5\' 11"  (1.803 m)   Wt 265 lb (120.2 kg)   BMI 36.96 kg/m   Constitutional:  Alert and oriented, Presents with wife today.  Mild stress caring emesis basin. HEENT:  AT, moist mucus membranes.  Trachea midline, no masses. Cardiovascular: No clubbing, cyanosis, or edema. Respiratory: Normal respiratory effort, no increased work of breathing. GI: Abdomen is soft, nontender, nondistended, no abdominal masses GU: No CVA tenderness.  Skin: No rashes, bruises or suspicious lesions. Neurologic: Grossly intact, no focal deficits, moving all 4 extremities. Psychiatric: Normal mood and affect.  Laboratory Data: Lab Results  Component Value Date   WBC 8.1 01/16/2017   HGB 15.9  01/16/2017   HCT 46.8 01/16/2017   MCV 89.7 01/16/2017   PLT 178 01/16/2017    Lab Results  Component Value Date   CREATININE 1.31 (H) 01/15/2017    Lab Results  Component Value Date   HGBA1C 6.6 (H) 11/13/2014    Urinalysis Results for orders placed or performed in visit on 01/16/17  Urinalysis, Complete  Result Value Ref Range   Specific Gravity, UA 1.025 1.005 - 1.030   pH, UA 5.0 5.0 - 7.5   Color, UA Yellow Yellow   Appearance Ur Clear Clear   Leukocytes, UA Negative Negative   Protein, UA Negative Negative/Trace   Glucose, UA Negative Negative   Ketones, UA Negative Negative   RBC, UA Negative Negative  Bilirubin, UA Negative Negative   Urobilinogen, Ur 0.2 0.2 - 1.0 mg/dL   Nitrite, UA Negative Negative    Pertinent Imaging: CLINICAL DATA:  Bilateral hydronephrosis on renal sonogram from 1 day prior. History of nephrolithiasis.  EXAM: CT ABDOMEN AND PELVIS WITHOUT CONTRAST  TECHNIQUE: Multidetector CT imaging of the abdomen and pelvis was performed following the standard protocol without IV contrast.  COMPARISON:  01/14/2017 renal sonogram. 01/03/2017 abdominal radiographs. 04/26/2016 unenhanced CT abdomen/ pelvis.  FINDINGS: Lower chest: No significant pulmonary nodules or acute consolidative airspace disease. Right coronary atherosclerosis.  Hepatobiliary: Normal liver size. Mild diffuse hepatic steatosis. No liver mass. No definite liver surface irregularity. Cholelithiasis. No gallbladder distention, gallbladder wall thickening or pericholecystic fluid. No biliary ductal dilatation.  Pancreas: Normal, with no mass or duct dilation.  Spleen: Normal size. No mass.  Adrenals/Urinary Tract: Normal adrenals. There are two clustered obstructing 5 mm right ureteral stones in the proximal pelvic segment of the right ureter, with moderate right hydroureteronephrosis. There is a 4 mm stone in the distal pelvic segment of the left ureter just  proximal to the left ureterovesical junction, with minimal left hydroureteronephrosis. No stones in the renal collecting systems. No additional ureteral stones. Simple 6.0 cm lower right renal cyst. No additional contour deforming renal lesions. Normal bladder.  Stomach/Bowel: Grossly normal stomach. Normal caliber small bowel with no small bowel wall thickening. Normal appendix. Mild sigmoid diverticulosis, with no large bowel wall thickening or pericolonic fat stranding.  Vascular/Lymphatic: Atherosclerotic nonaneurysmal abdominal aorta. No pathologically enlarged lymph nodes in the abdomen or pelvis.  Reproductive: Top-normal size prostate with nonspecific internal prostatic calcifications.  Other: No pneumoperitoneum, ascites or focal fluid collection.  Musculoskeletal: No aggressive appearing focal osseous lesions. Mild thoracolumbar spondylosis. Lower sternotomy wire is intact.  IMPRESSION: 1. Two clustered obstructing 5 mm right ureteral stones in the proximal pelvic segment of the right ureter, with moderate right hydroureteronephrosis. 2. Distal left pelvic ureteral 4 mm stone just proximal to the left UVJ, with minimal left hydroureteronephrosis . 3. Additional findings include aortic atherosclerosis, coronary atherosclerosis, mild diffuse hepatic steatosis, cholelithiasis and mild sigmoid diverticulosis.  These results will be called to the ordering clinician or representative by the Radiologist Assistant, and communication documented in the PACS or zVision Dashboard.   Electronically Signed   By: Delbert Phenix M.D.   On: 01/15/2017 11:44  CT scan imaging was personally reviewed today along with renal ultrasound imaging. This is also reviewed with the patient and his wife (by face time).  Assessment & Plan:    1. Bilateral partially obstructing ureteral calculi Mildly elevated creatinine yesterday, but not significantly to suggest severe acute renal  failure In the setting of bilateral partially ejected stones, I recommended urgent intervention. In the absence of infection, would recommend proceeding with bilateral ureteroscopy, laser lithotripsy, and ureteral stent placement. Risks and benefits of ureteroscopy were reviewed including but not limited to infection, bleeding, pain, ureteral injury which could require open surgery versus prolonged indwelling if ureteralperforation occurs, persistent stone disease, requirement for staged procedure, possible stent, and global anesthesia risks. Patient expressed understanding and desires to proceed with ureteroscopy. Given his history of CAD, would recommend continuation of 81 mg of aspirin Cardiac clearance today -UA  2. Renal cyst, acquired, right Appears to be a simple cyst on the right, no further workup indicated  Schedule surgery tomorrow as above   [name], MD  [facility]  [address] [place], [place] [address] [phone]  I spent 25 min with this patient of which greater than 50% was spent in counseling and coordination of care with the patient.

## 2017-02-04 NOTE — Anesthesia Procedure Notes (Signed)
Procedure Name: LMA Insertion Date/Time: 02/04/2017 7:48 AM Performed by: Omer Jack Pre-anesthesia Checklist: Patient identified, Patient being monitored, Timeout performed, Emergency Drugs available and Suction available Patient Re-evaluated:Patient Re-evaluated prior to inductionOxygen Delivery Method: Circle system utilized Preoxygenation: Pre-oxygenation with 100% oxygen Intubation Type: IV induction Ventilation: Mask ventilation without difficulty LMA: LMA inserted LMA Size: 4.5 Tube type: Oral Number of attempts: 1 Placement Confirmation: positive ETCO2 and breath sounds checked- equal and bilateral Tube secured with: Tape Dental Injury: Teeth and Oropharynx as per pre-operative assessment

## 2017-02-05 ENCOUNTER — Encounter: Payer: Self-pay | Admitting: Urology

## 2017-02-18 ENCOUNTER — Other Ambulatory Visit: Payer: BC Managed Care – PPO

## 2017-02-21 ENCOUNTER — Encounter: Payer: Self-pay | Admitting: Urology

## 2017-02-21 ENCOUNTER — Ambulatory Visit (INDEPENDENT_AMBULATORY_CARE_PROVIDER_SITE_OTHER): Payer: BC Managed Care – PPO | Admitting: Urology

## 2017-02-21 VITALS — BP 115/75 | HR 79 | Ht 71.0 in | Wt 260.0 lb

## 2017-02-21 DIAGNOSIS — N200 Calculus of kidney: Secondary | ICD-10-CM | POA: Diagnosis not present

## 2017-02-21 LAB — MICROSCOPIC EXAMINATION: RBC, UA: >30 /hpf — ABNORMAL HIGH (ref 0–?)

## 2017-02-21 LAB — URINALYSIS, COMPLETE
Bilirubin, UA: NEGATIVE
Glucose, UA: NEGATIVE
Ketones, UA: NEGATIVE
Nitrite, UA: NEGATIVE
Specific Gravity, UA: >1.03 — ABNORMAL HIGH (ref 1.005–1.030)
Urobilinogen, Ur: 1 mg/dL (ref 0.2–1.0)
pH, UA: 6.5 (ref 5.0–7.5)

## 2017-02-21 MED ORDER — CIPROFLOXACIN HCL 500 MG PO TABS
500.0000 mg | ORAL_TABLET | Freq: Once | ORAL | Status: AC
  Administered 2017-02-21: 500 mg via ORAL

## 2017-02-21 MED ORDER — LIDOCAINE HCL 2 % EX GEL
1.0000 "application " | Freq: Once | CUTANEOUS | Status: AC
  Administered 2017-02-21: 1 via URETHRAL

## 2017-02-21 NOTE — Progress Notes (Signed)
   02/21/17  CC:  Chief Complaint  Patient presents with  . Cysto Stent Removal    HPI: Indication: Seth Riddle is a 56 y.o. patient with a history of bilateral ureteral calculi status post staged bilateral ureteroscopy Returns today for removal of his remaining right ureteral stent. His left ureteral stent was removed at the time of his last surgery in the OR.  Blood pressure 115/75, pulse 79, height 5\' 11"  (1.803 m), weight 260 lb (117.9 kg). NED. A&Ox3.   No respiratory distress   Abd soft, NT, ND Normal phallus with bilateral descended testicles  Cystoscopy Procedure Note  Patient identification was confirmed, informed consent was obtained, and patient was prepped using Betadine solution.  Lidocaine jelly was administered per urethral meatus.    Preoperative abx where received prior to procedure.     Pre-Procedure: - Inspection reveals a normal caliber ureteral meatus.  Procedure: The flexible cystoscope was introduced without difficulty - No urethral strictures/lesions are present. - Right ureteral stent removed with flexible graspers per meatus intact.   Post-Procedure: - Patient tolerated the procedure well  Assessment/ Plan:  1. Bilateral ureteral stone status post ureteroscopy The patient will follow-up in 1 month with renal ultrasound prior with his primary urologist, Dr. Apolinar Junes, to rule out iatrogenic hydronephrosis. His stone analysis should be available at that time.

## 2017-03-18 ENCOUNTER — Ambulatory Visit: Payer: BC Managed Care – PPO

## 2017-03-19 ENCOUNTER — Telehealth: Payer: Self-pay | Admitting: Urology

## 2017-03-19 ENCOUNTER — Ambulatory Visit: Payer: BC Managed Care – PPO | Admitting: Urology

## 2017-03-19 NOTE — Telephone Encounter (Signed)
I got patient rescheduled to next week and he is scheduling his RUS right now.  Thanks,  Marcelino Duster

## 2017-03-21 ENCOUNTER — Ambulatory Visit: Payer: BC Managed Care – PPO

## 2017-03-25 ENCOUNTER — Ambulatory Visit: Payer: BC Managed Care – PPO | Admitting: Urology

## 2017-03-28 ENCOUNTER — Ambulatory Visit
Admission: RE | Admit: 2017-03-28 | Discharge: 2017-03-28 | Disposition: A | Discharge status: Home / Self Care | Payer: BC Managed Care – PPO | Source: Ambulatory Visit | Attending: Urology | Admitting: Urology

## 2017-03-28 DIAGNOSIS — N200 Calculus of kidney: Secondary | ICD-10-CM | POA: Diagnosis present

## 2017-03-28 DIAGNOSIS — N281 Cyst of kidney, acquired: Secondary | ICD-10-CM | POA: Insufficient documentation

## 2017-04-11 ENCOUNTER — Ambulatory Visit (INDEPENDENT_AMBULATORY_CARE_PROVIDER_SITE_OTHER): Payer: BC Managed Care – PPO | Admitting: Urology

## 2017-04-11 ENCOUNTER — Encounter: Payer: Self-pay | Admitting: Urology

## 2017-04-11 VITALS — BP 124/85 | HR 83 | Ht 71.0 in | Wt 260.0 lb

## 2017-04-11 DIAGNOSIS — N200 Calculus of kidney: Secondary | ICD-10-CM | POA: Diagnosis not present

## 2017-04-11 DIAGNOSIS — N281 Cyst of kidney, acquired: Secondary | ICD-10-CM

## 2017-04-11 NOTE — Progress Notes (Signed)
04/11/2017 9:00 AM   Seth Riddle 03/23/1961 [id number]  Referring provider: [name], MD [phone] [address] [facility] [place], [place] [address]  CC: flank pain  HPI: 56 year old male with history of nephrolithiasis returns today for routine follow-up.   He presented with bilateral partially obstructing ureteral calculi, 4 mm left UVJ stone along with 2- 5 mm right distal ureteral stones (mild hydro) at the level of the iliac with moderate hydroureteronephrosis on the side.  He was taken to the operating room on 01/17/2017 as well as 02/04/2017 for staged ureteroscopy to treat the stones. The stone on the right was relatively impacted and required 2 procedures to clear all stone burden.  Stents were subsequently removed.  Follow-up renal ultrasound shows no residual stone burden or hydroureteronephrosis.  Stone composition 95% calcium oxalate, 5% calcium proximal.  He denies any flank pain, gross hematuria, or urinary symptoms. He is overall feeling quite well.  He doesn't to not drinking enough fluids.  PMH: Past Medical History:  Diagnosis Date  . Coronary artery disease   . Diabetes mellitus without complication (HCC)   . Dyslipidemia (high LDL; low HDL)   . Family history of premature CAD   . Headache   . History of kidney stones   . Low HDL (under 40)   . MI (myocardial infarction) (HCC) 2016  . PONV (postoperative nausea and vomiting)    as a child eye surgery    Surgical History: Past Surgical History:  Procedure Laterality Date  . CORONARY ARTERY BYPASS GRAFT N/A 11/15/2014   Procedure: CORONARY ARTERY BYPASS GRAFTING (CABG), ON PUMP, TIMES THREE, USING LEFT INTERNAL MAMMARY ARTERY, RIGHT GREATER SAPHENOUS VEIN HARVESTED ENDOSCOPICALLY;  Surgeon: Loreli Slot, MD;  Location: [facility] OR;  Service: Open Heart Surgery;  Laterality: N/A;  . CYSTOSCOPY W/ URETERAL STENT PLACEMENT Right 02/04/2017   Procedure:  CYSTOSCOPY WITH STENT REPLACEMENT;  Surgeon: [name], MD;  Location: [facility] ORS;  Service: Urology;  Laterality: Right;  . CYSTOSCOPY W/ URETERAL STENT REMOVAL Left 02/04/2017   Procedure: CYSTOSCOPY WITH STENT REMOVAL;  Surgeon: [name], MD;  Location: [facility] ORS;  Service: Urology;  Laterality: Left;  . CYSTOSCOPY WITH STENT PLACEMENT Bilateral 01/17/2017   Procedure: CYSTOSCOPY WITH STENT PLACEMENT;  Surgeon: [name], MD;  Location: [facility] ORS;  Service: Urology;  Laterality: Bilateral;  . EYE SURGERY     multiple eye surgeries as a child  . INTRA-AORTIC BALLOON PUMP INSERTION N/A 11/15/2014   Procedure: INTRA-AORTIC BALLOON PUMP INSERTION;  Surgeon: Lennette Bihari, MD;  Location: [facility] CATH LAB;  Service: Cardiovascular;  Laterality: N/A;  . LEFT HEART CATHETERIZATION WITH CORONARY ANGIOGRAM N/A 11/14/2014   Procedure: LEFT HEART CATHETERIZATION WITH CORONARY ANGIOGRAM;  Surgeon: Lennette Bihari, MD;  Location: [facility] CATH LAB;  Service: Cardiovascular;  Laterality: N/A;  . PERCUTANEOUS CORONARY STENT INTERVENTION (PCI-S) N/A 11/15/2014   Procedure: PERCUTANEOUS CORONARY STENT INTERVENTION (PCI-S);  Surgeon: Lennette Bihari, MD;  Location: [facility] CATH LAB;  Service: Cardiovascular;  Laterality: N/A;  . STOMACH SURGERY     at 62 weeks old  . TEE WITHOUT CARDIOVERSION N/A 11/15/2014   Procedure: TRANSESOPHAGEAL ECHOCARDIOGRAM (TEE);  Surgeon: Loreli Slot, MD;  Location: [facility] OR;  Service: Open Heart Surgery;  Laterality: N/A;  . URETEROSCOPY WITH HOLMIUM LASER LITHOTRIPSY Bilateral 01/17/2017   Procedure: URETEROSCOPY WITH HOLMIUM LASER LITHOTRIPSY;  Surgeon: [name], MD;  Location: [facility] ORS;  Service: Urology;  Laterality: Bilateral;  .  URETEROSCOPY WITH HOLMIUM LASER LITHOTRIPSY Right 02/04/2017   Procedure: URETEROSCOPY WITH HOLMIUM LASER LITHOTRIPSY;  Surgeon: [name], MD;  Location: [facility] ORS;  Service: Urology;  Laterality: Right;    Home Medications:  Allergies as of  04/11/2017      Reactions   Coconut Oil Hives      Medication List       Accurate as of 04/11/17  9:00 AM. Always use your most recent med list.          aspirin EC 81 MG tablet Take 1 tablet (81 mg total) by mouth daily.   lisinopril 5 MG tablet Commonly known as:  PRINIVIL,ZESTRIL Take 1 tablet (5 mg total) by mouth daily.   metFORMIN 500 MG tablet Commonly known as:  GLUCOPHAGE Take 500 mg by mouth 2 (two) times daily with a meal.   metoprolol tartrate 25 MG tablet Commonly known as:  LOPRESSOR Take 1 tablet (25 mg total) by mouth 2 (two) times daily.   rosuvastatin 20 MG tablet Commonly known as:  CRESTOR TAKE 1 TABLET BY MOUTH EVERY DAY   tamsulosin 0.4 MG Caps capsule Commonly known as:  FLOMAX Take 1 capsule (0.4 mg total) by mouth daily.       Allergies:  Allergies  Allergen Reactions  . Coconut Oil Hives    Family History: Family History  Problem Relation Age of Onset  . Heart disease Mother   . Stroke Mother   . Nephrolithiasis Mother   . Heart attack Father   . Stroke Father   . Kidney cancer Neg Hx   . Bladder Cancer Neg Hx   . Prostate cancer Neg Hx     Social History:  reports that he has never smoked. He has never used smokeless tobacco. He reports that he drinks alcohol. He reports that he does not use drugs.  ROS: 12 point review of systems was performed and is negative other than as per history of present illness.  Physical Exam: BP 124/85   Pulse 83   Ht 5\' 11"  (1.803 m)   Wt 260 lb (117.9 kg)   BMI 36.26 kg/m   Constitutional:  Alert and oriented,  NED HEENT: [place] AT, moist mucus membranes.  Trachea midline, no masses. Cardiovascular: No clubbing, cyanosis, or edema. Respiratory: Normal respiratory effort, no increased work of breathing. GI: Abdomen is soft, nontender, nondistended, no abdominal masses GU: No CVA tenderness.  Skin: No rashes, bruises or suspicious lesions. Neurologic: Grossly intact, no focal deficits,  moving all 4 extremities. Psychiatric: Normal mood and affect.  Laboratory Data: Lab Results  Component Value Date   WBC 8.1 01/16/2017   HGB 15.9 01/16/2017   HCT 46.8 01/16/2017   MCV 89.7 01/16/2017   PLT 178 01/16/2017    Lab Results  Component Value Date   CREATININE 1.31 (H) 01/15/2017    Lab Results  Component Value Date   HGBA1C 6.6 (H) 11/13/2014    Urinalysis N/a  Pertinent Imaging: CLINICAL DATA:  Nephrolithiasis. Status post stone removal approximately 6 weeks ago.  EXAM: RENAL / URINARY TRACT ULTRASOUND COMPLETE  COMPARISON:  Abdomen pelvis CT dated 01/15/2017 and renal ultrasound dated 01/14/2017.  FINDINGS: Right Kidney:  Length: 12.2 cm. Large, exophytic lower pole cyst measuring 5.3 cm in maximum diameter today. Normal renal echotexture. No hydronephrosis or visible calculi.  Left Kidney:  Length: 11.8 cm. Echogenicity within normal limits. No mass or hydronephrosis visualized. No visible calculi.  Bladder:  Appears normal for degree of bladder distention.  IMPRESSION: 1. Resolved hydronephrosis. 2. Previously demonstrated large exophytic lower pole right renal cyst.   Electronically Signed   By: Beckie Salts M.D.   On: 03/28/2017 22:50  RUS personally reviewed  Assessment & Plan:    1. History of nephrolithiasis S/p URS x 2, impacted right ureteral stone F/u RUS negative for hydro Stone composition reivewed We discussed general stone prevention techniques including drinking plenty water with goal of producing 2.5 L urine daily, increased citric acid intake, avoidance of high oxalate containing foods, and decreased salt intake.  Information about dietary recommendations given today.   2. Renal cyst, acquired, right Simple cyst on the right, no further workup indicated  F/u 1 year with KUB  [name], MD  [facility]  [address] [place], [place] [address] [phone]

## 2018-02-23 ENCOUNTER — Encounter: Payer: Self-pay | Admitting: Physician Assistant

## 2018-03-10 ENCOUNTER — Ambulatory Visit (INDEPENDENT_AMBULATORY_CARE_PROVIDER_SITE_OTHER): Payer: BC Managed Care – PPO | Admitting: Physician Assistant

## 2018-03-10 ENCOUNTER — Encounter: Payer: Self-pay | Admitting: Physician Assistant

## 2018-03-10 VITALS — BP 118/84 | HR 73 | Ht 71.0 in | Wt 258.0 lb

## 2018-03-10 DIAGNOSIS — R079 Chest pain, unspecified: Secondary | ICD-10-CM | POA: Diagnosis not present

## 2018-03-10 DIAGNOSIS — I2102 ST elevation (STEMI) myocardial infarction involving left anterior descending coronary artery: Secondary | ICD-10-CM | POA: Diagnosis not present

## 2018-03-10 DIAGNOSIS — E785 Hyperlipidemia, unspecified: Secondary | ICD-10-CM

## 2018-03-10 DIAGNOSIS — I25708 Atherosclerosis of coronary artery bypass graft(s), unspecified, with other forms of angina pectoris: Secondary | ICD-10-CM | POA: Diagnosis not present

## 2018-03-10 DIAGNOSIS — I10 Essential (primary) hypertension: Secondary | ICD-10-CM

## 2018-03-10 DIAGNOSIS — E119 Type 2 diabetes mellitus without complications: Secondary | ICD-10-CM

## 2018-03-10 NOTE — Progress Notes (Signed)
Cardiology Office Note    Date:  03/10/2018   ID:  Seth Riddle, DOB [birth year], MRN [id number]  PCP:  [name], MD  Cardiologist:  Dr. Elease Hashimoto  Chief Complaint: Yearly follow up  History of Present Illness:   Seth Riddle is a 57 y.o. male with hx of CAD s/p CABG, DM, HLD and chronic systolic heart failure/ischemic cardiomyopathy present for follow up.   Hx of CAD with initial admission with NSTEMI. 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 Developed recurrent chest pain with anterior STEMI on 11/14/14. Underwent emergent PCI and IABP followed by emergent CABG.   Last echo 10/2015 showed LVEF of 40-45% (essestially unchanged since CABG) and grade 1 DD. He was complaining of chest pain and no pain when seen by Dr. Elease Hashimoto 05/2016. Offered stress test however declined. Her symptoms could be due to fluctuating blood pressure.  Here was doing well on cardiac stand point when last seen by me 01/16/2017 for surgical clearance.   Here today for follow-up. he has not been taking any of medication for past year.  He swims for about 30 minutes and lifts weights for about 60 minutes 5-6 times per week.  No limiting symptoms. The patient denies nausea, vomiting, fever, chest pain, palpitations, shortness of breath, orthopnea, PND, dizziness, syncope, cough, congestion, abdominal pain, hematochezia, melena, lower extremity edema.  Past Medical History:  Diagnosis Date  . Coronary artery disease   . Diabetes mellitus without complication (HCC)   . Dyslipidemia (high LDL; low HDL)   . Family history of premature CAD   . Headache   . History of kidney stones   . Low HDL (under 40)   . MI (myocardial infarction) (HCC) 2016  . PONV (postoperative nausea and vomiting)    as a child eye surgery    Past Surgical History:  Procedure Laterality Date  . CORONARY ARTERY BYPASS GRAFT N/A 11/15/2014   Procedure: CORONARY ARTERY BYPASS GRAFTING (CABG), ON PUMP, TIMES THREE, USING LEFT  INTERNAL MAMMARY ARTERY, RIGHT GREATER SAPHENOUS VEIN HARVESTED ENDOSCOPICALLY;  Surgeon: Loreli Slot, MD;  Location: [facility] OR;  Service: Open Heart Surgery;  Laterality: N/A;  . CYSTOSCOPY W/ URETERAL STENT PLACEMENT Right 02/04/2017   Procedure: CYSTOSCOPY WITH STENT REPLACEMENT;  Surgeon: [name], MD;  Location: [facility] ORS;  Service: Urology;  Laterality: Right;  . CYSTOSCOPY W/ URETERAL STENT REMOVAL Left 02/04/2017   Procedure: CYSTOSCOPY WITH STENT REMOVAL;  Surgeon: [name], MD;  Location: [facility] ORS;  Service: Urology;  Laterality: Left;  . CYSTOSCOPY WITH STENT PLACEMENT Bilateral 01/17/2017   Procedure: CYSTOSCOPY WITH STENT PLACEMENT;  Surgeon: [name], MD;  Location: [facility] ORS;  Service: Urology;  Laterality: Bilateral;  . EYE SURGERY     multiple eye surgeries as a child  . INTRA-AORTIC BALLOON PUMP INSERTION N/A 11/15/2014   Procedure: INTRA-AORTIC BALLOON PUMP INSERTION;  Surgeon: Lennette Bihari, MD;  Location: [facility] CATH LAB;  Service: Cardiovascular;  Laterality: N/A;  . LEFT HEART CATHETERIZATION WITH CORONARY ANGIOGRAM N/A 11/14/2014   Procedure: LEFT HEART CATHETERIZATION WITH CORONARY ANGIOGRAM;  Surgeon: Lennette Bihari, MD;  Location: [facility] CATH LAB;  Service: Cardiovascular;  Laterality: N/A;  . PERCUTANEOUS CORONARY STENT INTERVENTION (PCI-S) N/A 11/15/2014   Procedure: PERCUTANEOUS CORONARY STENT INTERVENTION (PCI-S);  Surgeon: Lennette Bihari, MD;  Location: [facility] CATH LAB;  Service: Cardiovascular;  Laterality: N/A;  . STOMACH SURGERY     at 41 weeks old  . TEE WITHOUT CARDIOVERSION N/A 11/15/2014  Procedure: TRANSESOPHAGEAL ECHOCARDIOGRAM (TEE);  Surgeon: Loreli Slot, MD;  Location: [facility] OR;  Service: Open Heart Surgery;  Laterality: N/A;  . URETEROSCOPY WITH HOLMIUM LASER LITHOTRIPSY Bilateral 01/17/2017   Procedure: URETEROSCOPY WITH HOLMIUM LASER LITHOTRIPSY;  Surgeon: [name], MD;  Location: [facility] ORS;  Service: Urology;  Laterality: Bilateral;    . URETEROSCOPY WITH HOLMIUM LASER LITHOTRIPSY Right 02/04/2017   Procedure: URETEROSCOPY WITH HOLMIUM LASER LITHOTRIPSY;  Surgeon: [name], MD;  Location: [facility] ORS;  Service: Urology;  Laterality: Right;    Current Medications: Prior to Admission medications   Medication Sig Start Date End Date Taking? Authorizing Provider  aspirin EC 81 MG tablet Take 1 tablet (81 mg total) by mouth daily. 01/05/15   Nahser, Deloris Ping, MD  lisinopril (PRINIVIL,ZESTRIL) 5 MG tablet Take 1 tablet (5 mg total) by mouth daily. 05/07/16   Nahser, Deloris Ping, MD  metFORMIN (GLUCOPHAGE) 500 MG tablet Take 500 mg by mouth 2 (two) times daily with a meal.    [provider]  metoprolol tartrate (LOPRESSOR) 25 MG tablet Take 1 tablet (25 mg total) by mouth 2 (two) times daily. 06/03/16   Nahser, Deloris Ping, MD  rosuvastatin (CRESTOR) 20 MG tablet TAKE 1 TABLET BY MOUTH EVERY DAY 11/07/16   Nahser, Deloris Ping, MD  tamsulosin (FLOMAX) 0.4 MG CAPS capsule Take 1 capsule (0.4 mg total) by mouth daily. 02/04/17   [name], MD    Allergies:   Coconut oil   Social History   Socioeconomic History  . Marital status: Married    Spouse name: Not on file  . Number of children: Not on file  . Years of education: Not on file  . Highest education level: Not on file  Occupational History  . Not on file  Social Needs  . Financial resource strain: Not on file  . Food insecurity:    Worry: Not on file    Inability: Not on file  . Transportation needs:    Medical: Not on file    Non-medical: Not on file  Tobacco Use  . Smoking status: Never Smoker  . Smokeless tobacco: Never Used  Substance and Sexual Activity  . Alcohol use: Yes    Alcohol/week: 0.0 - 0.6 oz    Comment: 1 glass of wine every 3 months  . Drug use: No  . Sexual activity: Not on file  Lifestyle  . Physical activity:    Days per week: Not on file    Minutes per session: Not on file  . Stress: Not on file  Relationships  . Social  connections:    Talks on phone: Not on file    Gets together: Not on file    Attends religious service: Not on file    Active member of club or organization: Not on file    Attends meetings of clubs or organizations: Not on file    Relationship status: Not on file  Other Topics Concern  . Not on file  Social History Narrative  . Not on file     Family History:  The patient's family history includes Heart attack in his father; Heart disease in his mother; Nephrolithiasis in his mother; Stroke in his father and mother.   ROS:   Please see the history of present illness.    ROS All other systems reviewed and are negative.   PHYSICAL EXAM:   VS:  BP 118/84   Pulse 73   Ht 5\' 11"  (1.803 m)   Wt  258 lb (117 kg)   SpO2 97%   BMI 35.98 kg/m    GEN: Well nourished, well developed, in no acute distress  HEENT: normal  Neck: no JVD, carotid bruits, or masses Cardiac: RRR; no murmurs, rubs, or gallops,no edema  Respiratory:  clear to auscultation bilaterally, normal work of breathing GI: soft, nontender, nondistended, + BS MS: no deformity or atrophy  Skin: warm and dry, no rash Neuro:  Alert and Oriented x 3, Strength and sensation are intact Psych: euthymic mood, full affect  Wt Readings from Last 3 Encounters:  03/10/18 258 lb (117 kg)  04/11/17 260 lb (117.9 kg)  02/21/17 260 lb (117.9 kg)      Studies/Labs Reviewed:   EKG:  EKG is ordered today.  The ekg ordered today demonstrates sinus rhythm at rate of 73 bpm.  No acute changes.  Recent Labs: No results found for requested labs within last 8760 hours.   Lipid Panel    Component Value Date/Time   CHOL 121 (L) 01/26/2016 0807   TRIG 160 (H) 01/26/2016 0807   HDL 25 (L) 01/26/2016 0807   CHOLHDL 4.8 01/26/2016 0807   VLDL 32 (H) 01/26/2016 0807   LDLCALC 64 01/26/2016 0807    Additional studies/ records that were reviewed today include:   Echocardiogram: 10/2015 Study Conclusions  - Left ventricle: The  cavity size was normal. There was mild concentric hypertrophy. Systolic function was mildly to moderately reduced. The estimated ejection fraction was in the range of 40% to 45%. Severe hypokinesis of the mid-apicalanteroseptal and anterior myocardium. Akinesis of the apical myocardium. Doppler parameters are consistent with abnormal left ventricular relaxation (grade 1 diastolic dysfunction). - Left atrium: The atrium was mildly dilated.  CABG 10/2014 Left internal mammary artery to left anterior descending Saphenous vein graft to first diagonal Saphenous vein graft to first obtuse marginal   ASSESSMENT & PLAN:    1. CAD s/p CABG x 3 - No angina.  He is not taking any medication.  Restart aspirin 81 mg daily.  Other medication pending labs.  Continue excellent exercise regimen.  2. ICM/Chronic systolic heart failure - Last echo 10/2015 showed LVEF of 40-45% (essestially unchanged since CABG) and grade 1 DD. No sign of HF.  Not taking BB and ACE.   3. HTN -Well-controlled.  Not on any regimen.  4.  Hyperlipidemia -We will check labs prior to restarting medication.   Medication Adjustments/Labs and Tests Ordered: Current medicines are reviewed at length with the patient today.  Concerns regarding medicines are outlined above.  Medication changes, Labs and Tests ordered today are listed in the Patient Instructions below. Patient Instructions  Medication Instructions: Your physician has recommended you make the following change in your medication:  START: Aspirin 81 mg daily    Labwork: FUTURE: Lipids, Cmp & HgB A1c on   Procedures/Testing: None Ordered  Follow-Up: Your physician wants you to follow-up in: 1 year with Dr. Prescott Parma will receive a reminder letter in the mail two months in advance. If you don't receive a letter, please call our office to schedule the follow-up appointment.   Any Additional Special Instructions Will Be  Listed Below (If Applicable).     If you need a refill on your cardiac medications before your next appointment, please call your pharmacy.      Lorelei Pont, [place]  03/10/2018 4:16 PM    [facility] [address] [place], [place], [place]  [address] Phone: [phone]; Fax: [phone]  

## 2018-03-10 NOTE — Patient Instructions (Addendum)
Medication Instructions: Your physician has recommended you make the following change in your medication:  START: Aspirin 81 mg daily    Labwork: FUTURE: Lipids, Cmp & HgB A1c on   Procedures/Testing: None Ordered  Follow-Up: Your physician wants you to follow-up in: 1 year with Dr. Prescott Parma will receive a reminder letter in the mail two months in advance. If you don't receive a letter, please call our office to schedule the follow-up appointment.   Any Additional Special Instructions Will Be Listed Below (If Applicable).     If you need a refill on your cardiac medications before your next appointment, please call your pharmacy.

## 2018-03-16 ENCOUNTER — Other Ambulatory Visit: Payer: BC Managed Care – PPO | Admitting: *Deleted

## 2018-03-16 DIAGNOSIS — E119 Type 2 diabetes mellitus without complications: Secondary | ICD-10-CM

## 2018-03-16 DIAGNOSIS — I10 Essential (primary) hypertension: Secondary | ICD-10-CM

## 2018-03-16 DIAGNOSIS — R079 Chest pain, unspecified: Secondary | ICD-10-CM

## 2018-03-16 DIAGNOSIS — E785 Hyperlipidemia, unspecified: Secondary | ICD-10-CM

## 2018-03-17 LAB — COMPREHENSIVE METABOLIC PANEL
ALT: 42 IU/L (ref 0–44)
AST: 26 IU/L (ref 0–40)
Albumin/Globulin Ratio: 1.7 (ref 1.2–2.2)
Albumin: 4.3 g/dL (ref 3.5–5.5)
Alkaline Phosphatase: 96 IU/L (ref 39–117)
BILIRUBIN TOTAL: 0.9 mg/dL (ref 0.0–1.2)
BUN/Creatinine Ratio: 12 (ref 9–20)
BUN: 15 mg/dL (ref 6–24)
CHLORIDE: 101 mmol/L (ref 96–106)
CO2: 23 mmol/L (ref 20–29)
CREATININE: 1.21 mg/dL (ref 0.76–1.27)
Calcium: 9.2 mg/dL (ref 8.7–10.2)
GFR calc Af Amer: 76 mL/min/{1.73_m2} (ref >59)
GFR calc non Af Amer: 66 mL/min/{1.73_m2} (ref >59)
GLUCOSE: 113 mg/dL — AB (ref 65–99)
Globulin, Total: 2.5 g/dL (ref 1.5–4.5)
Potassium: 3.9 mmol/L (ref 3.5–5.2)
Sodium: 140 mmol/L (ref 134–144)
Total Protein: 6.8 g/dL (ref 6.0–8.5)

## 2018-03-17 LAB — LIPID PANEL
CHOL/HDL RATIO: 6.6 ratio — AB (ref 0.0–5.0)
Cholesterol, Total: 204 mg/dL — ABNORMAL HIGH (ref 100–199)
HDL: 31 mg/dL — AB (ref >39)
LDL CALC: 142 mg/dL — AB (ref 0–99)
Triglycerides: 156 mg/dL — ABNORMAL HIGH (ref 0–149)
VLDL Cholesterol Cal: 31 mg/dL (ref 5–40)

## 2018-03-17 LAB — HEMOGLOBIN A1C
ESTIMATED AVERAGE GLUCOSE: 123 mg/dL
Hgb A1c MFr Bld: 5.9 % — ABNORMAL HIGH (ref 4.8–5.6)

## 2018-03-18 ENCOUNTER — Telehealth: Payer: Self-pay | Admitting: *Deleted

## 2018-03-18 DIAGNOSIS — E7849 Other hyperlipidemia: Secondary | ICD-10-CM

## 2018-03-18 MED ORDER — ATORVASTATIN CALCIUM 80 MG PO TABS: 80.0000 mg | ORAL_TABLET | Freq: Every day | ORAL | 3 refills | Status: DC

## 2018-03-18 NOTE — Telephone Encounter (Signed)
Pt has been notified of lab results by phone with verbal understanding. Pt agreeable to recommendations to start Lipitor 80 mg daily, FLP/LFT 05/04/18. Rx has been sent to [facility] on [address] in [place]. Pt thanked me for the call.

## 2018-03-18 NOTE — Telephone Encounter (Signed)
-----   Message from [place], [place] sent at 03/18/2018  9:14 AM EDT ----- Electrolytes and renal function are normal. LDL 142, not at goal of less than 70. Start lipitor 80mg  qd. Repeat lipid panel and LFTS in 6 weeks.

## 2018-04-14 ENCOUNTER — Encounter: Payer: Self-pay | Admitting: Urology

## 2018-04-14 ENCOUNTER — Ambulatory Visit: Payer: BC Managed Care – PPO | Admitting: Urology

## 2018-05-04 ENCOUNTER — Other Ambulatory Visit: Payer: BC Managed Care – PPO

## 2018-06-09 ENCOUNTER — Other Ambulatory Visit: Payer: BC Managed Care – PPO

## 2019-02-14 IMAGING — CR DG ABDOMEN 1V
1 series · 2 of 2 positions shown · non-contrast
Comparison: CT scan of April 26, 2016.

CLINICAL DATA: Left flank pain.

EXAM:
ABDOMEN - 1 VIEW

[Series 1: dg abd 1 view · 0.14mm/px · 2 of 2 slices shown]
[im 1/2]
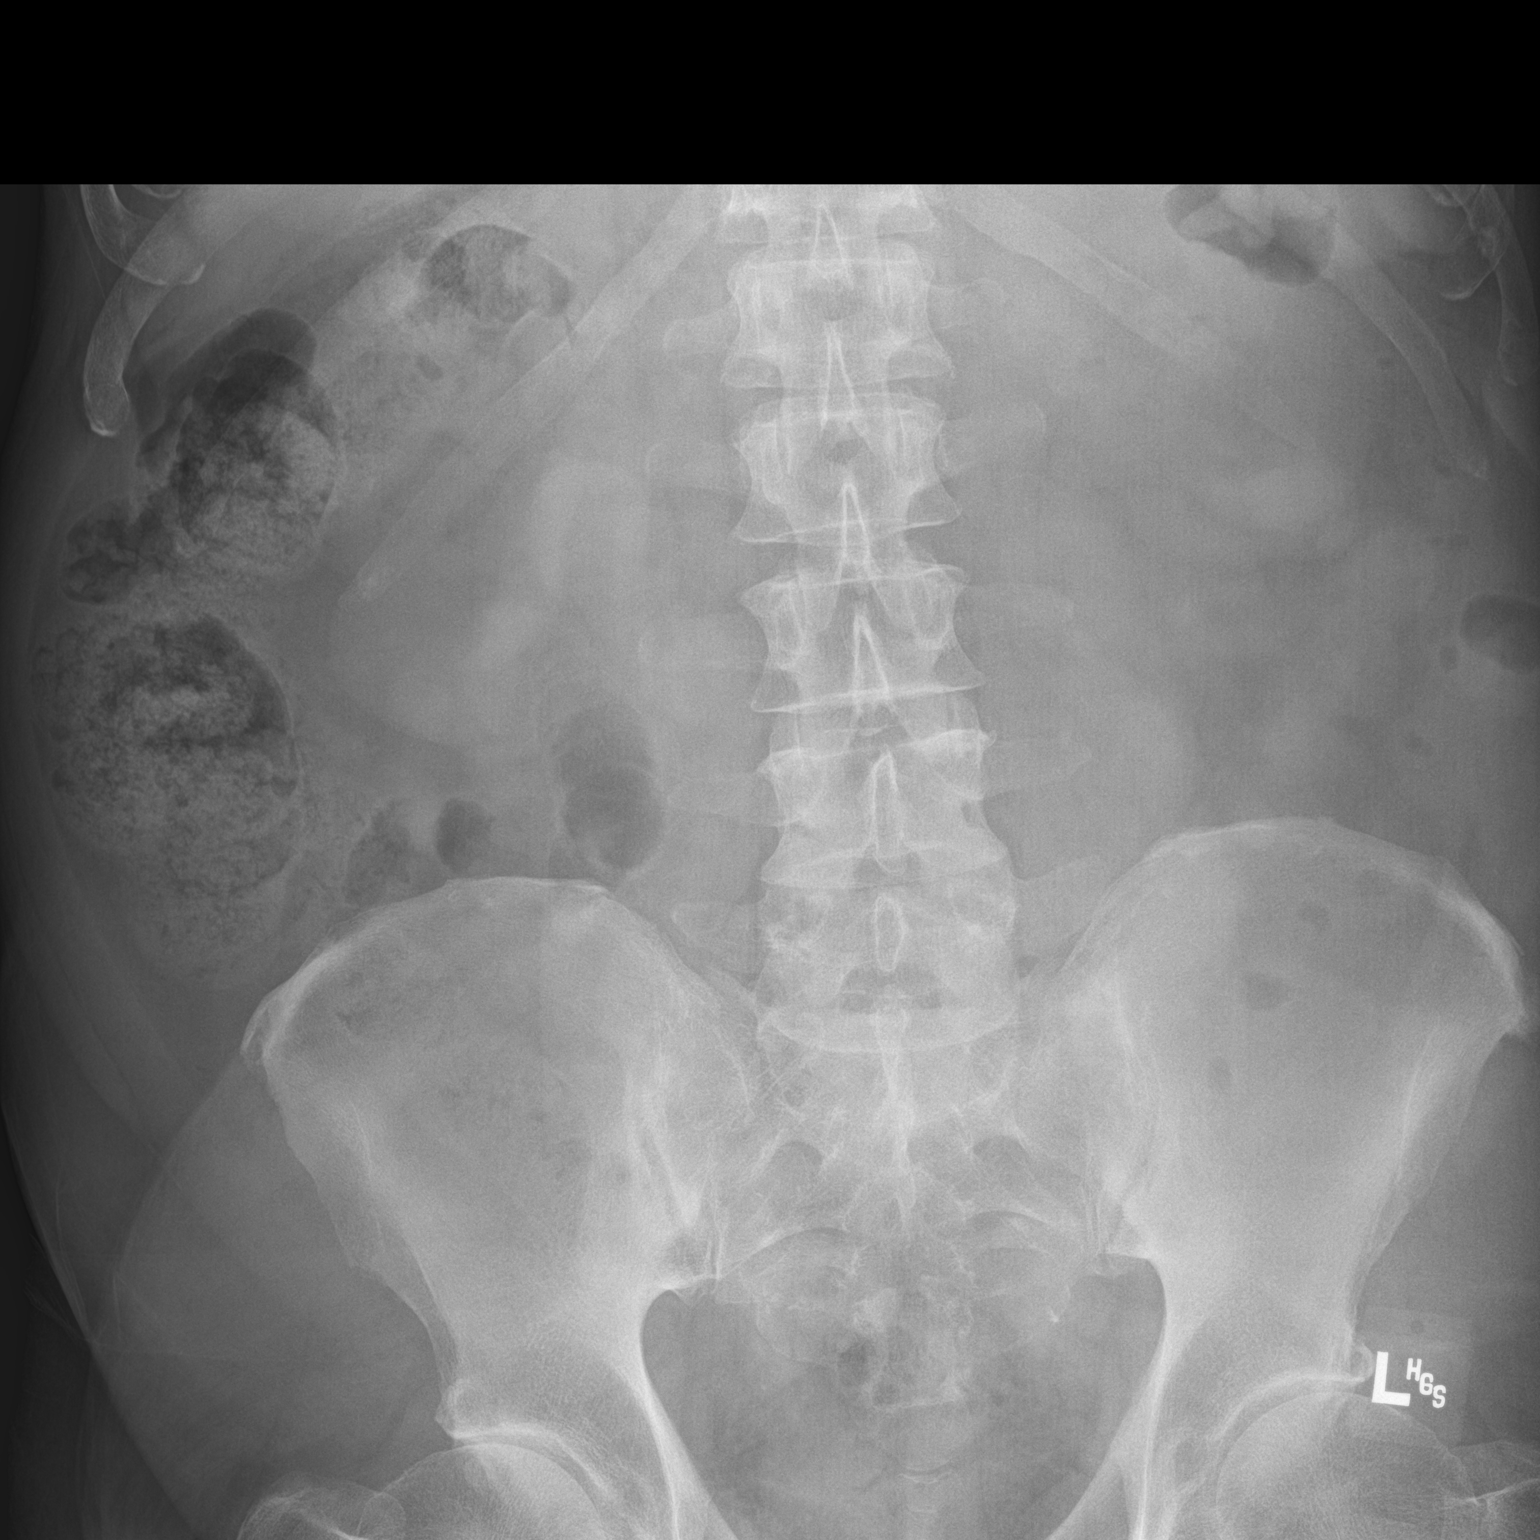
[im 2/2]
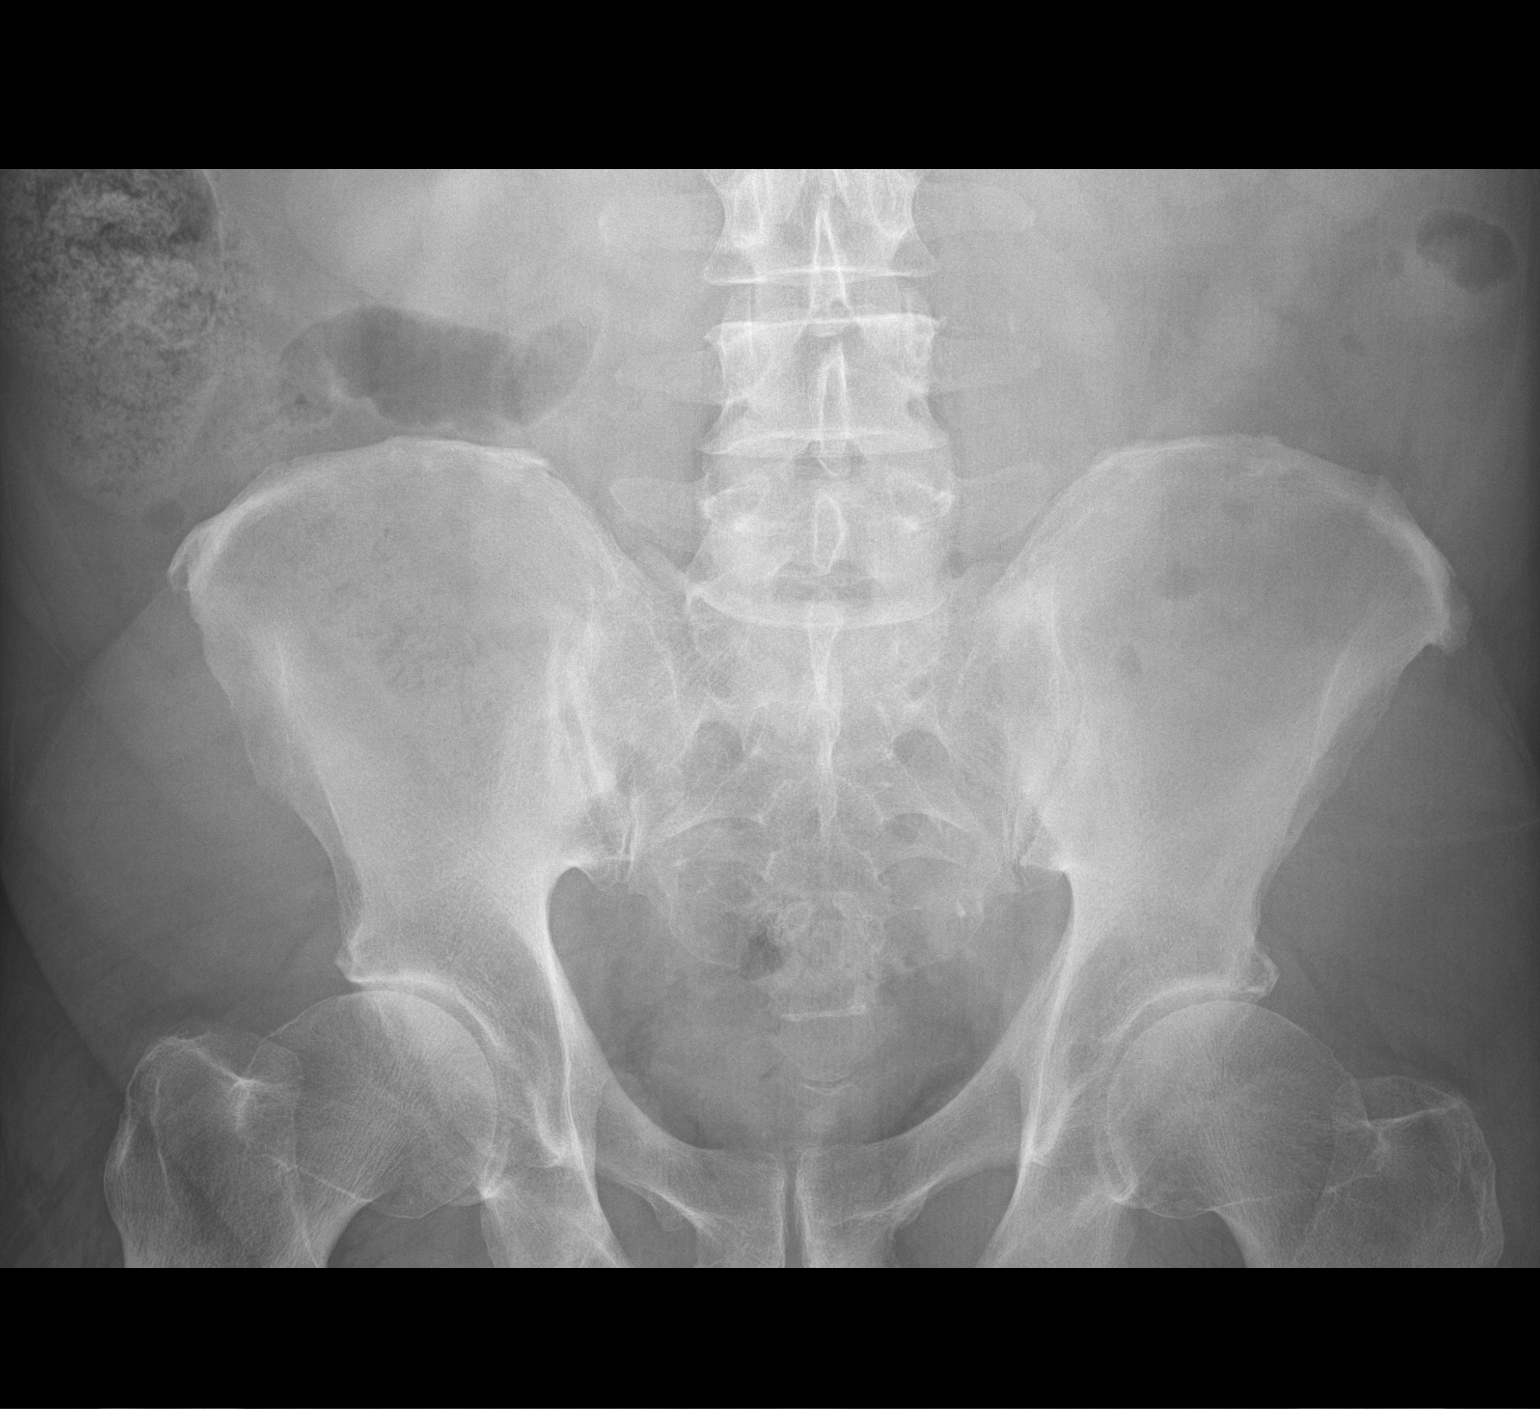

[2 of 2 positions shown; findings below may reference images not displayed]

FINDINGS: The bowel gas pattern is normal. No radio-opaque calculi or other
significant radiographic abnormality are seen.
IMPRESSION: No evidence of bowel obstruction or ileus. No definite renal calculi
are noted.

## 2019-02-26 IMAGING — CT CT RENAL STONE PROTOCOL
2 of 4 series · 15 of 46 positions shown, 17 images · non-contrast
Comparison: 01/14/2017 renal sonogram. 01/03/2017 abdominal
radiographs. 04/26/2016 unenhanced CT abdomen/ pelvis.

CLINICAL DATA: Bilateral hydronephrosis on renal sonogram from 1
day prior. History of nephrolithiasis.

EXAM:
CT ABDOMEN AND PELVIS WITHOUT CONTRAST
TECHNIQUE: Multidetector CT imaging of the abdomen and pelvis was performed
following the standard protocol without IV contrast.

[Series 2: stone full standard · axial · 0.88mm/px · z∈[-562,-72]mm · 12 of 108 slices shown, 14 images]
[im 5/108  soft-tissue]
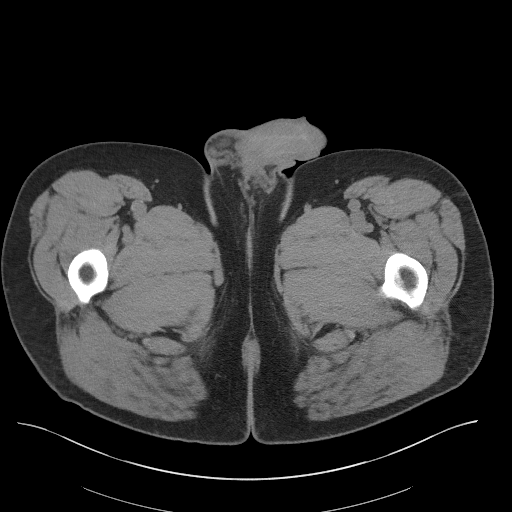
[im 5/108  bone]
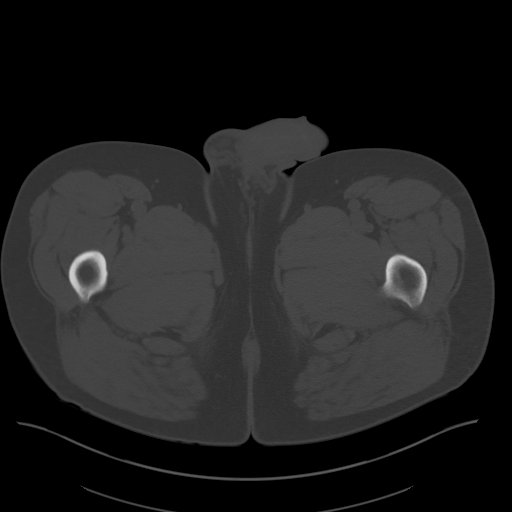
[im 14/108  soft-tissue]
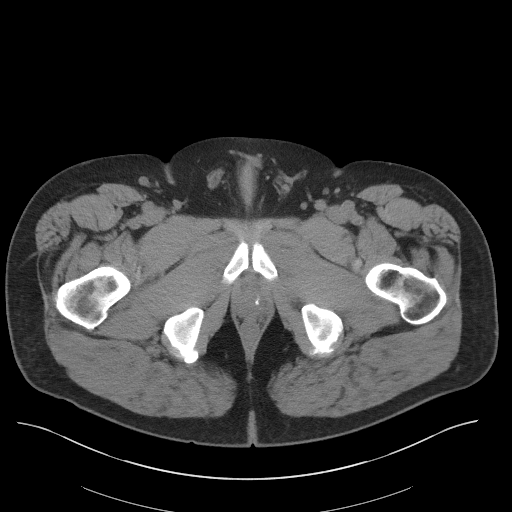
[im 23/108  soft-tissue]
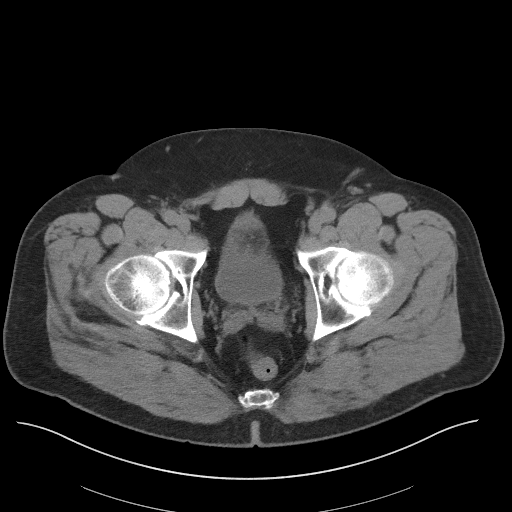
[im 32/108  soft-tissue]
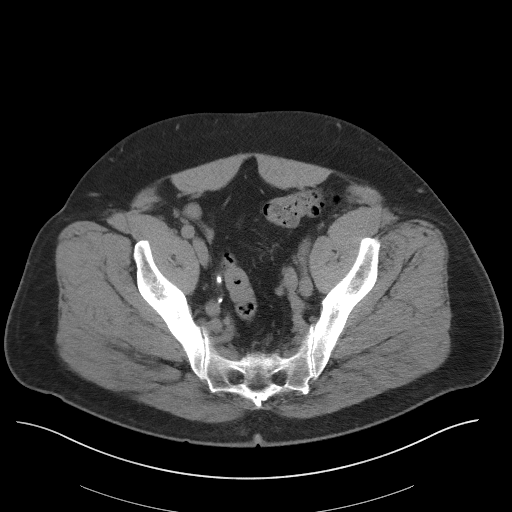
[im 41/108  soft-tissue]
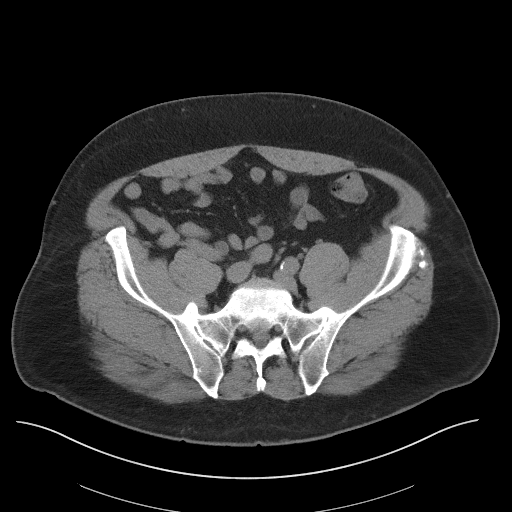
[im 50/108  soft-tissue]
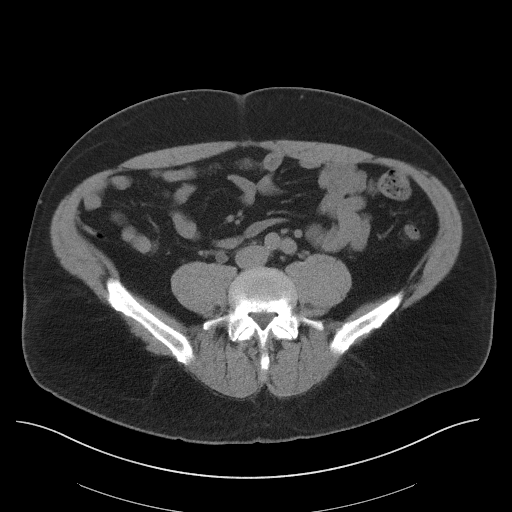
[im 58/108  soft-tissue]
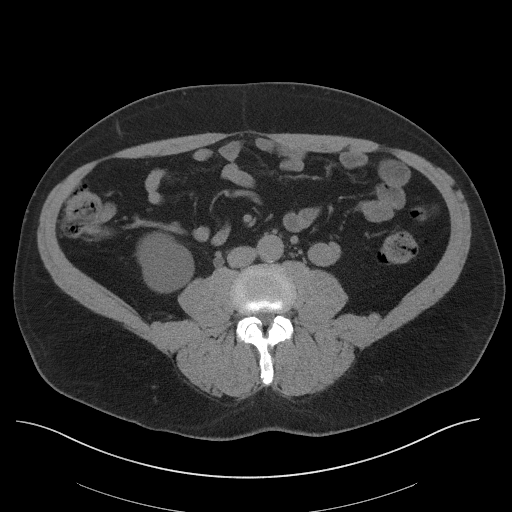
[im 67/108  soft-tissue]
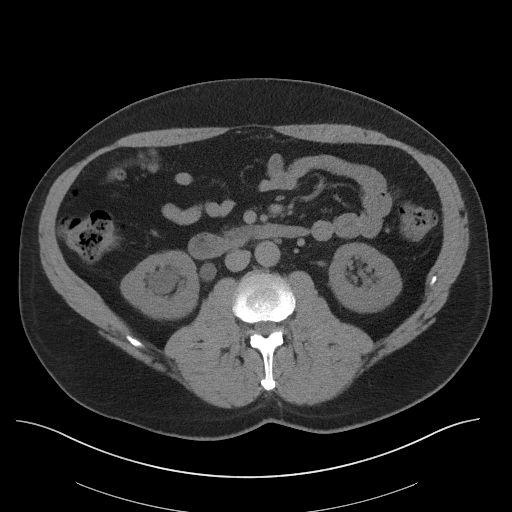
[im 76/108  soft-tissue]
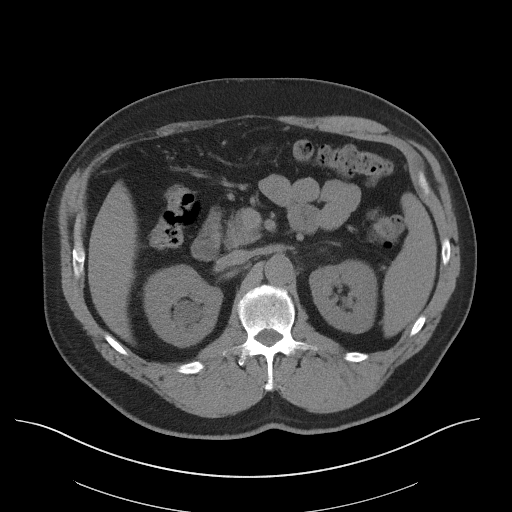
[im 76/108  bone]
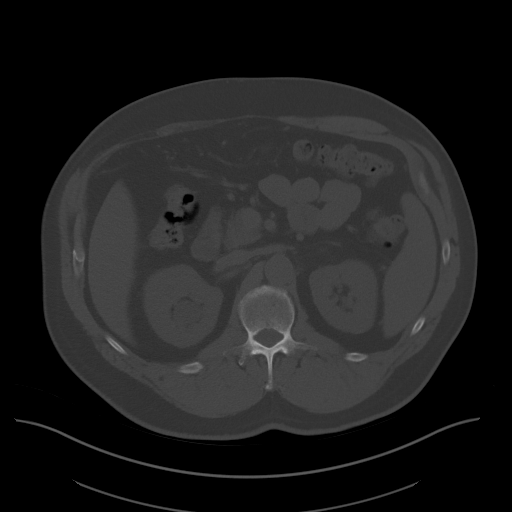
[im 85/108  soft-tissue]
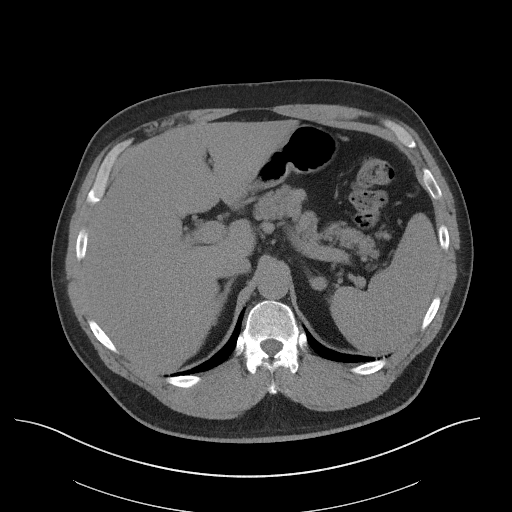
[im 94/108  soft-tissue]
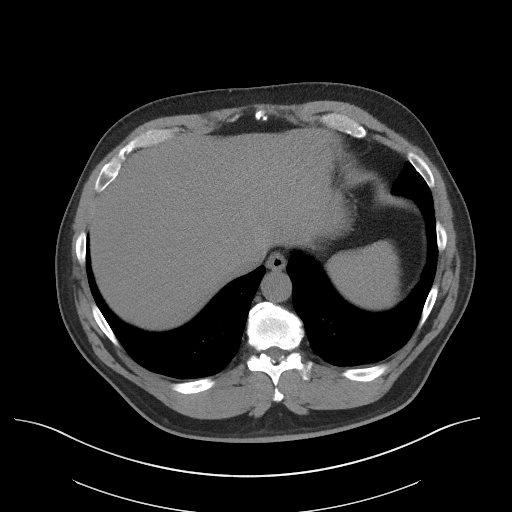
[im 103/108  soft-tissue]
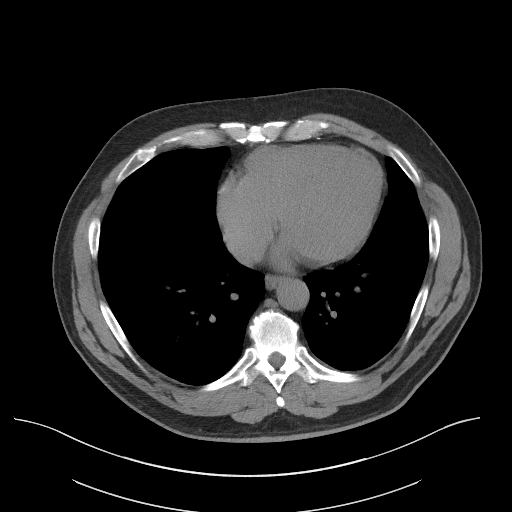

[Series 5: coronal · coronal · 0.90mm/px · 3 of 149 slices shown]
[im 50/149  soft-tissue]
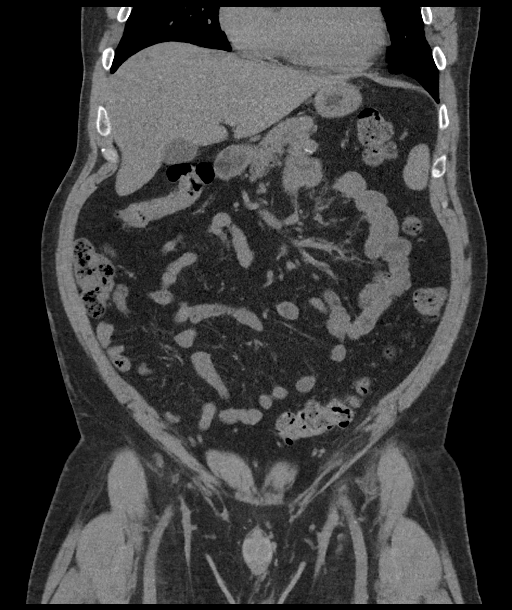
[im 66/149  soft-tissue]
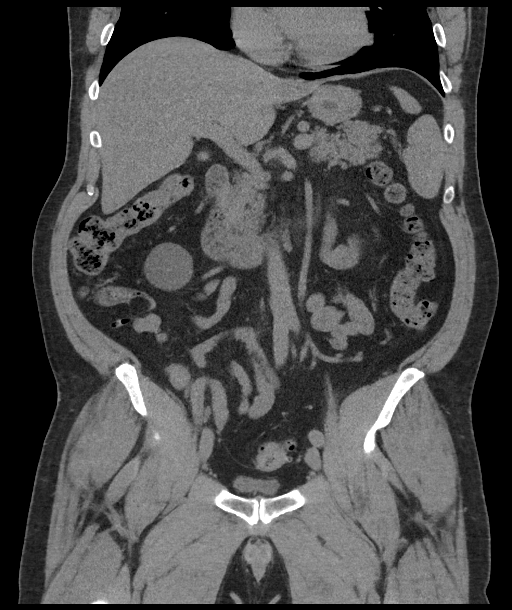
[im 83/149  soft-tissue]
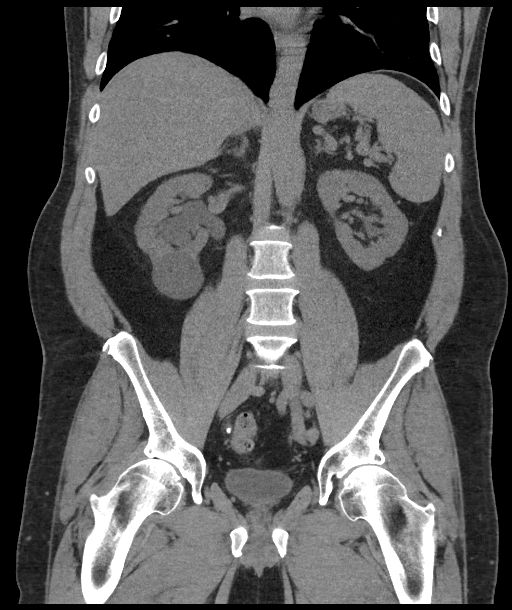

[15 of 46 positions shown; findings below may reference images not displayed]

FINDINGS: Lower chest: No significant pulmonary nodules or acute consolidative
airspace disease. Right coronary atherosclerosis.

Hepatobiliary: Normal liver size. Mild diffuse hepatic steatosis. No
liver mass. No definite liver surface irregularity. Cholelithiasis.
No gallbladder distention, gallbladder wall thickening or
pericholecystic fluid. No biliary ductal dilatation.

Pancreas: Normal, with no mass or duct dilation.

Spleen: Normal size. No mass.

Adrenals/Urinary Tract: Normal adrenals. There are two clustered
obstructing 5 mm right ureteral stones in the proximal pelvic
segment of the right ureter, with moderate right
hydroureteronephrosis. There is a 4 mm stone in the distal pelvic
segment of the left ureter just proximal to the left ureterovesical
junction, with minimal left hydroureteronephrosis. No stones in the
renal collecting systems. No additional ureteral stones. Simple
cm lower right renal cyst. No additional contour deforming renal
lesions. Normal bladder.

Stomach/Bowel: Grossly normal stomach. Normal caliber small bowel
with no small bowel wall thickening. Normal appendix. Mild sigmoid
diverticulosis, with no large bowel wall thickening or pericolonic
fat stranding.

Vascular/Lymphatic: Atherosclerotic nonaneurysmal abdominal aorta.
No pathologically enlarged lymph nodes in the abdomen or pelvis.

Reproductive: Top-normal size prostate with nonspecific internal
prostatic calcifications.

Other: No pneumoperitoneum, ascites or focal fluid collection.

Musculoskeletal: No aggressive appearing focal osseous lesions. Mild
thoracolumbar spondylosis. Lower sternotomy wire is intact.
IMPRESSION: 1. Two clustered obstructing 5 mm right ureteral stones in the
proximal pelvic segment of the right ureter, with moderate right
hydroureteronephrosis.
2. Distal left pelvic ureteral 4 mm stone just proximal to the left
UVJ, with minimal left hydroureteronephrosis .
3. Additional findings include aortic atherosclerosis, coronary
atherosclerosis, mild diffuse hepatic steatosis, cholelithiasis and
mild sigmoid diverticulosis.

These results will be called to the ordering clinician or
representative by the Radiologist Assistant, and communication
documented in the PACS or zVision Dashboard.

## 2019-06-01 ENCOUNTER — Telehealth: Payer: Self-pay | Admitting: *Deleted

## 2019-06-01 NOTE — Telephone Encounter (Signed)
Patient doesn't have home blood pressure cuff to obtain blood presusre  Virtual Visit Pre-Appointment Phone Call  I am calling you today to discuss your upcoming appointment. We are currently trying to limit exposure to the virus that causes COVID-19 by seeing patients at home rather than in the office."  1. "What is the BEST phone number to call the day of the visit?" - include this in appointment notes  2. "Do you have or have access to (through a family member/friend) a smartphone with video capability that we can use for your visit?" a. If yes - list this number in appt notes as "cell" (if different from BEST phone #) and list the appointment type as a VIDEO visit in appointment notes b. If no - list the appointment type as a PHONE visit in appointment notes  3. Confirm consent - "In the setting of the current Covid19 crisis, you are scheduled for a (phone or video) visit with your provider on (date) at (time).  Just as we do with many in-office visits, in order for you to participate in this visit, we must obtain consent.  If you'd like, I can send this to your mychart (if signed up) or email for you to review.  Otherwise, I can obtain your verbal consent now.  All virtual visits are billed to your insurance company just like a normal visit would be.  By agreeing to a virtual visit, we'd like you to understand that the technology does not allow for your provider to perform an examination, and thus may limit your provider's ability to fully assess your condition. If your provider identifies any concerns that need to be evaluated in person, we will make arrangements to do so.  Finally, though the technology is pretty good, we cannot assure that it will always work on either your or our end, and in the setting of a video visit, we may have to convert it to a phone-only visit.  In either situation, we cannot ensure that we have a secure connection.  Are you willing to proceed?" STAFF: Did the  patient verbally acknowledge consent to telehealth visit? Document YES/NO here: YES  4. Advise patient to be prepared - "Two hours prior to your appointment, go ahead and check your blood pressure, pulse, oxygen saturation, and your weight (if you have the equipment to check those) and write them all down. When your visit starts, your provider will ask you for this information. If you have an Apple Watch or Kardia device, please plan to have heart rate information ready on the day of your appointment. Please have a pen and paper handy nearby the day of the visit as well."  5. Give patient instructions for MyChart download to smartphone OR Doximity/Doxy.me as below if video visit (depending on what platform provider is using)  6. Inform patient they will receive a phone call 15 minutes prior to their appointment time (may be from unknown caller ID) so they should be prepared to answer    TELEPHONE CALL NOTE  Seth Riddle has been deemed a candidate for a follow-up tele-health visit to limit community exposure during the Covid-19 pandemic. I spoke with the patient via phone to ensure availability of phone/video source, confirm preferred email & phone number, and discuss instructions and expectations.  I reminded Seth Riddle to be prepared with any vital sign and/or heart rhythm information that could potentially be obtained via home monitoring, at the time of his visit. I reminded  Seth Riddle to expect a phone call prior to his visit.  Oleta Mouse, CMA 06/01/2019 4:44 PM   INSTRUCTIONS FOR DOWNLOADING THE MYCHART APP TO SMARTPHONE  - The patient must first make sure to have activated MyChart and know their login information - If Apple, go to [employer] and type in MyChart in the search bar and download the app. If Android, ask patient to go to [employer] and type in [place] in the search bar and download the app. The app is free but as with any other app downloads,  their phone may require them to verify saved payment information or Apple/Android password.  - The patient will need to then log into the app with their MyChart username and password, and select [facility] as their healthcare provider to link the account. When it is time for your visit, go to the MyChart app, find appointments, and click Begin Video Visit. Be sure to Select Allow for your device to access the Microphone and Camera for your visit. You will then be connected, and your provider will be with you shortly.  **If they have any issues connecting, or need assistance please contact MyChart service desk [phone])**  **If using a computer, in order to ensure the best quality for their visit they will need to use either of the following Internet Browsers: [employer] or Google Chrome**  IF USING DOXIMITY or DOXY.ME - The patient will receive a link just prior to their visit by text.     FULL LENGTH CONSENT FOR TELE-HEALTH VISIT   I hereby voluntarily request, consent and authorize [facility] and its employed or contracted physicians, physician assistants, nurse practitioners or other licensed health care professionals (the Practitioner), to provide me with telemedicine health care services (the "Services") as deemed necessary by the treating Practitioner. I acknowledge and consent to receive the Services by the Practitioner via telemedicine. I understand that the telemedicine visit will involve communicating with the Practitioner through live audiovisual communication technology and the disclosure of certain medical information by electronic transmission. I acknowledge that I have been given the opportunity to request an in-person assessment or other available alternative prior to the telemedicine visit and am voluntarily participating in the telemedicine visit.  I understand that I have the right to withhold or withdraw my consent to the use of telemedicine in the  course of my care at any time, without affecting my right to future care or treatment, and that the Practitioner or I may terminate the telemedicine visit at any time. I understand that I have the right to inspect all information obtained and/or recorded in the course of the telemedicine visit and may receive copies of available information for a reasonable fee.  I understand that some of the potential risks of receiving the Services via telemedicine include:  Marland Kitchen Delay or interruption in medical evaluation due to technological equipment failure or disruption; . Information transmitted may not be sufficient (e.g. poor resolution of images) to allow for appropriate medical decision making by the Practitioner; and/or  . In rare instances, security protocols could fail, causing a breach of personal health information.  Furthermore, I acknowledge that it is my responsibility to provide information about my medical history, conditions and care that is complete and accurate to the best of my ability. I acknowledge that Practitioner's advice, recommendations, and/or decision may be based on factors not within their control, such as incomplete or inaccurate data provided by me or distortions of diagnostic  images or specimens that may result from electronic transmissions. I understand that the practice of medicine is not an exact science and that Practitioner makes no warranties or guarantees regarding treatment outcomes. I acknowledge that I will receive a copy of this consent concurrently upon execution via email to the email address I last provided but may also request a printed copy by calling the office of [facility].    I understand that my insurance will be billed for this visit.   I have read or had this consent read to me. . I understand the contents of this consent, which adequately explains the benefits and risks of the Services being provided via telemedicine.  . I have been provided ample  opportunity to ask questions regarding this consent and the Services and have had my questions answered to my satisfaction. . I give my informed consent for the services to be provided through the use of telemedicine in my medical care  By participating in this telemedicine visit I agree to the above.

## 2019-06-02 ENCOUNTER — Other Ambulatory Visit: Payer: Self-pay

## 2019-06-02 ENCOUNTER — Telehealth (INDEPENDENT_AMBULATORY_CARE_PROVIDER_SITE_OTHER): Payer: BC Managed Care – PPO | Admitting: Cardiovascular Disease

## 2019-06-02 ENCOUNTER — Other Ambulatory Visit: Payer: Self-pay | Admitting: Nurse Practitioner

## 2019-06-02 VITALS — Ht 71.0 in | Wt 245.0 lb

## 2019-06-02 DIAGNOSIS — I10 Essential (primary) hypertension: Secondary | ICD-10-CM

## 2019-06-02 DIAGNOSIS — E785 Hyperlipidemia, unspecified: Secondary | ICD-10-CM | POA: Diagnosis not present

## 2019-06-02 DIAGNOSIS — I2510 Atherosclerotic heart disease of native coronary artery without angina pectoris: Secondary | ICD-10-CM

## 2019-06-02 DIAGNOSIS — E119 Type 2 diabetes mellitus without complications: Secondary | ICD-10-CM

## 2019-06-02 DIAGNOSIS — I5022 Chronic systolic (congestive) heart failure: Secondary | ICD-10-CM

## 2019-06-02 NOTE — Progress Notes (Signed)
Virtual Visit via Telephone Note   This visit type was conducted due to national recommendations for restrictions regarding the COVID-19 Pandemic (e.g. social distancing) in an effort to limit this patient's exposure and mitigate transmission in our community.  Due to his co-morbid illnesses, this patient is at least at moderate risk for complications without adequate follow up.  This format is felt to be most appropriate for this patient at this time.  The patient did not have access to video technology/had technical difficulties with video requiring transitioning to audio format only (telephone).  All issues noted in this document were discussed and addressed.  No physical exam could be performed with this format.  Please refer to the patient's chart for his  consent to telehealth for [facility].   Date:  06/02/2019   ID:  Seth Riddle, DOB 1960/09/22, MRN [id number]  Patient Location: Home Provider Location: Home  PCP:  [name], MD  Cardiologist:  Renley Gutman   Electrophysiologist:  None   Problem List 1. CAD - CABG 2. DM 3. Hyperlipidemia  4. Chronic systolic CHF - EF 40-45%    Evaluation Performed:  Follow-Up Visit  Chief Complaint:  CAD   Oct. 14, 2020    Seth Riddle is a 58 y.o. male with NSTEMI in 2016 - emergent PCI and then CABG.  EF is 40-45%  Feeling great. Had stopped his meds when he was last seen by [facility].  Has not been exercising  Will get his BP later today No CP or dyspnea.  Advised him to exercise more, Watch his diet    The patient does not have symptoms concerning for COVID-19 infection (fever, chills, cough, or new shortness of breath).    Past Medical History:  Diagnosis Date  . Coronary artery disease   . Diabetes mellitus without complication (HCC)   . Dyslipidemia (high LDL; low HDL)   . Family history of premature CAD   . Headache   . History of kidney stones   . Low HDL (under 40)   . MI (myocardial infarction)  (HCC) 2016  . PONV (postoperative nausea and vomiting)    as a child eye surgery   Past Surgical History:  Procedure Laterality Date  . CORONARY ARTERY BYPASS GRAFT N/A 11/15/2014   Procedure: CORONARY ARTERY BYPASS GRAFTING (CABG), ON PUMP, TIMES THREE, USING LEFT INTERNAL MAMMARY ARTERY, RIGHT GREATER SAPHENOUS VEIN HARVESTED ENDOSCOPICALLY;  Surgeon: Loreli Slot, MD;  Location: [facility] OR;  Service: Open Heart Surgery;  Laterality: N/A;  . CYSTOSCOPY W/ URETERAL STENT PLACEMENT Right 02/04/2017   Procedure: CYSTOSCOPY WITH STENT REPLACEMENT;  Surgeon: [name], MD;  Location: [facility] ORS;  Service: Urology;  Laterality: Right;  . CYSTOSCOPY W/ URETERAL STENT REMOVAL Left 02/04/2017   Procedure: CYSTOSCOPY WITH STENT REMOVAL;  Surgeon: [name], MD;  Location: [facility] ORS;  Service: Urology;  Laterality: Left;  . CYSTOSCOPY WITH STENT PLACEMENT Bilateral 01/17/2017   Procedure: CYSTOSCOPY WITH STENT PLACEMENT;  Surgeon: [name], MD;  Location: [facility] ORS;  Service: Urology;  Laterality: Bilateral;  . EYE SURGERY     multiple eye surgeries as a child  . INTRA-AORTIC BALLOON PUMP INSERTION N/A 11/15/2014   Procedure: INTRA-AORTIC BALLOON PUMP INSERTION;  Surgeon: Lennette Bihari, MD;  Location[facility] CATH LAB;  Service: Cardiovascular;  Laterality: N/A;  . LEFT HEART CATHETERIZATION WITH CORONARY ANGIOGRAM N/A 11/14/2014   Procedure: LEFT HEART CATHETERIZATION WITH CORONARY ANGIOGRAM;  Surgeon: Lennette Bihari, MD;  Location: [facility] CATH LAB;  Service: Cardiovascular;  Laterality: N/A;  . PERCUTANEOUS CORONARY STENT INTERVENTION (PCI-S) N/A 11/15/2014   Procedure: PERCUTANEOUS CORONARY STENT INTERVENTION (PCI-S);  Surgeon: Troy Sine, MD;  Location: [facility] CATH LAB;  Service: Cardiovascular;  Laterality: N/A;  . STOMACH SURGERY     at 93 weeks old  . TEE WITHOUT CARDIOVERSION N/A 11/15/2014   Procedure: TRANSESOPHAGEAL ECHOCARDIOGRAM (TEE);  Surgeon: Melrose Nakayama, MD;  Location: [place];  Service: Open Heart Surgery;  Laterality: N/A;  . URETEROSCOPY WITH HOLMIUM LASER LITHOTRIPSY Bilateral 01/17/2017   Procedure: URETEROSCOPY WITH HOLMIUM LASER LITHOTRIPSY;  Surgeon: Hollice Espy, MD;  Location: [facility] ORS;  Service: Urology;  Laterality: Bilateral;  . URETEROSCOPY WITH HOLMIUM LASER LITHOTRIPSY Right 02/04/2017   Procedure: URETEROSCOPY WITH HOLMIUM LASER LITHOTRIPSY;  Surgeon: Hollice Espy, MD;  Location: [facility] ORS;  Service: Urology;  Laterality: Right;     No outpatient medications have been marked as taking for the 06/02/19 encounter (Appointment) with Seth Riddle, Wonda Cheng, MD.     Allergies:   Coconut oil   Social History   Tobacco Use  . Smoking status: Never Smoker  . Smokeless tobacco: Never Used  Substance Use Topics  . Alcohol use: Yes    Alcohol/week: 0.0 - 1.0 standard drinks    Comment: 1 glass of wine every 3 months  . Drug use: No     Family Hx: The patient's family history includes Heart attack in his father; Heart disease in his mother; Nephrolithiasis in his mother; Stroke in his father and mother. There is no history of Kidney cancer, Bladder Cancer, or Prostate cancer.  ROS:   Please see the history of present illness.     All other systems reviewed and are negative.   Prior CV studies:   The following studies were reviewed today:    Labs/Other Tests and Data Reviewed:    EKG:  No ECG reviewed.  Recent Labs: No results found for requested labs within last 8760 hours.   Recent Lipid Panel Lab Results  Component Value Date/Time   CHOL 204 (H) 03/16/2018 07:40 AM   TRIG 156 (H) 03/16/2018 07:40 AM   HDL 31 (L) 03/16/2018 07:40 AM   CHOLHDL 6.6 (H) 03/16/2018 07:40 AM   CHOLHDL 4.8 01/26/2016 08:07 AM   LDLCALC 142 (H) 03/16/2018 07:40 AM    Wt Readings from Last 3 Encounters:  03/10/18 258 lb (117 kg)  04/11/17 260 lb (117.9 kg)  02/21/17 260 lb (117.9 kg)     Objective:    Vital Signs:  There were no vitals taken for  this visit.    ASSESSMENT & PLAN:    1. Coronary artery disease: [name] is not had any episodes of angina.  He will continue with his aspirin and atorvastatin.  2.  Hyperlipidemia: His last LDL was 140.  It is unclear if he was actually taking his atorvastatin as prescribed.  We will get fasting lipids, liver enzymes, basic metabolic profile in the next week or 2.  I encouraged him to continue to watch his diet and to watch his cholesterol and fat intake.  3.  Chronic systolic congestive heart failure: Echocardiogram in 2017 showed an ejection fraction of 40 to 45%.  He is not having any signs or symptoms of congestive heart failure.  He tries to avoid eating salty foods.  We will review his blood pressure when he provides that to [place] later today.  We have also given some thought to repeating his echocardiogram.  I will  see him again in 6 months for follow-up office visit to review these issues.  COVID-19 Education: The signs and symptoms of COVID-19 were discussed with the patient and how to seek care for testing (follow up with PCP or arrange E-visit).  The importance of social distancing was discussed today.  Time:   Today, I have spent  20 minutes with the patient with telehealth technology discussing the above problems.     Medication Adjustments/Labs and Tests Ordered: Current medicines are reviewed at length with the patient today.  Concerns regarding medicines are outlined above.   Tests Ordered: No orders of the defined types were placed in this encounter.   Medication Changes: No orders of the defined types were placed in this encounter.   Follow Up:  In Person in 6 month(s)  Signed, Kristeen Miss, MD  06/02/2019 7:35 AM    [facility]

## 2019-06-02 NOTE — Patient Instructions (Signed)
Medication Instructions:  Your physician recommends that you continue on your current medications as directed. Please refer to the Current Medication list given to you today.  If you need a refill on your cardiac medications before your next appointment, please call your pharmacy.   Lab work: Your provider has advised that you have FASTING lab work done this week at [facility] for cholesterol, liver panel, and basic metabolic panel (kidney function and electrolytes)  If you have labs (blood work) drawn today and your tests are completely normal, you will receive your results only by: Marland Kitchen MyChart Message (if you have MyChart) OR . A paper copy in the mail If you have any lab test that is abnormal or we need to change your treatment, we will call you to review the results.  Testing/Procedures: None Ordered   Follow-Up: At [facility], you and your health needs are our priority.  As part of our continuing mission to provide you with exceptional heart care, we have created designated Provider Care Teams.  These Care Teams include your primary Cardiologist (physician) and Advanced Practice Providers (APPs -  Physician Assistants and Nurse Practitioners) who all work together to provide you with the care you need, when you need it. You will need a follow up appointment in:  6 months.  Please call our office 2 months in advance to schedule this appointment.  You may see Dr. Acie Fredrickson or one of the following Advanced Practice Providers on your designated Care Team: Richardson Dopp, PA-C [place], [place] . Daune Perch, NP

## 2019-12-09 ENCOUNTER — Encounter: Payer: Self-pay | Admitting: Cardiovascular Disease

## 2019-12-09 NOTE — Progress Notes (Signed)
Cardiology Office Note   Date:  12/10/2019   ID:  Seth Riddle, DOB January 27, 1961, MRN [id number]  PCP:  [name], MD  Cardiologist:   Kristeen Miss, MD   Chief Complaint  Patient presents with  . Coronary Artery Disease  . Hyperlipidemia      History of Present Illness: Seth Riddle is a 59 y.o. male who presents for follow-up for his recent hospitalization for coronary artery disease, myocardial infarction and subsequent coronary artery bypass grafting. His admitted with symptoms of unstable angina. Cardiac catheterization the following day revealed significant three-vessel coronary artery disease.  The left main coronary artery was angiographically normal and bifurcated into the LAD and left circumflex coronary artery.  he LAD was a large caliber vessel and immmediately gave rise to a very high diagonal (ramus intermediate like vessel). There was diffuse 95% stenosis in the LAD in the region of this diagonal takeoff with 90-95% stenosis diffusely just beyond the origin of this ramus like vessel and 90% diffuse stenosis at the origin of the ramus like high diagonal vessel almost immediately after the left main. The left circumflex coronary artery was a moderate size vessel that gave rise to one major marginal branch. There was 95% focal stenosis just beyond an ectatic segment in the proximal obtuse marginal branch and there was diffuse distal 90% marginal stenosis. The RCA was angiographically normal , dominant vessel that gave rise to a large PDA and PLA vessel.  Left ventriculography revealed normal global LV contractility without focal segmental wall motion abnormalities. There was no evidence for mitral regurgitation. Ejection fraction is 55-60%.    Unfortunately, the night after his cardiac catheterization he occluded his left anterior descending artery and had ongoing chest pain. He returned to the Cath Lab the following morning an emergency balloon angioplasty   was performed to open his LAD.    He was taken to emergent coronary artery bypass grafting at that time. Had a relatively slow recovery but has done fairly well. Echo done prior to DC revealed mild LV dysfunction:  Left ventricle: The cavity size was normal. Wall thickness was increased in a pattern of mild LVH. Systolic function was mildly reduced. The estimated ejection fraction was in the range of 45% to 50%. Diffuse hypokinesis. Doppler parameters are consistent with abnormal left ventricular relaxation (grade 1 diastolic dysfunction). - Right ventricle: The cavity size was mildly dilated. - Right atrium: The atrium was mildly dilated.  Impressions:  - Mild global reduction in LV function (EF 50); grade 1 diastolic dysfunction; mild RAE/RVE.    CABG  Left internal mammary artery to left anterior descending Saphenous vein graft to first diagonal Saphenous vein graft to first obtuse marginal   He has had significant right leg pain since the initial cath. He's had back surgery and the pain feels like a slipped disc.  Jan 05, 2015: Seth Riddle is doing well from a cardiac standpoint. He continues to have severe leg pain. He apparently had some injury that occurred following the initial cardiac catheterization prior to his bypass surgery. His echo from this week shows improved LV systolic function.  - Left ventricle: The cavity size was normal. Wall thickness was increased in a pattern of mild LVH. Systolic function was mildly to moderately reduced. The estimated ejection fraction was in the range of 40% to 45%. Akinesis of the apical myocardium. Moderate hypokinesis of the apicalanterior myocardium.  Sept. 8, 2016:  doing ok. Still has low stamina  Walking some.  Had been running on the treadmill at cardiac rehab.   October 25, 2015: Doing just ok still walking .   Prior to his MI, he could work all day, spend time with family and still work 3  hours later in the night.  His stamina has gone.    Is aware of his hear beat.    Feels mildly exhausted all day , every day .     No CP or angina   Sept. 19, 2017  Seth Riddle is seen today for eval and management of his elevated BP  Hx of CAD with emergent CABG  Has been feeling poorly for the past week or so. Has had a headache ( 8/10)  BP has been high  He had run out of his BP for 10 days. Had his meds refilled this weekend.   No orthostasis Has not been sleeping well - anxious about issues and isn't sleeping well. Has been depressed.  Has been walking regularly  - 2 miles 3 days a week.  , walks sporadically  No angina pain  No CP  Had some left arm tingling in his left arm last night  Oct. 2, 2017:  Seth Riddle is seen today for follow-up of his coronary artery disease, coronary artery bypass grafting, and hypertension. He had run out of his meds prior to his last visit . BP has been low HA has resolved. The anxiousness has resolved as well.  December 10, 2019: Seth Riddle is seen today for follow-up visit.  He has a history of coronary artery disease status post coronary artery bypass grafting.  He has ischemic cardiomyopathy with an ejection fraction of around 40 - 45%. Lots of stresses.     Has gained weight .  Wt. Is 274 lbs.  Gained 25 lbs this past year Had covid in Dec.   Both parents had covid  Parents are doing well.  No CP or dyspnea.  Does not sleep well , has never slept well . Gets 3-4 hours of sleep at night fall    Past Medical History:  Diagnosis Date  . Coronary artery disease   . Diabetes mellitus without complication (HCC)   . Dyslipidemia (high LDL; low HDL)   . Family history of premature CAD   . Headache   . History of kidney stones   . Low HDL (under 40)   . MI (myocardial infarction) (HCC) 2016  . PONV (postoperative nausea and vomiting)    as a child eye surgery    Past Surgical History:  Procedure Laterality Date  . CORONARY ARTERY BYPASS GRAFT  N/A 11/15/2014   Procedure: CORONARY ARTERY BYPASS GRAFTING (CABG), ON PUMP, TIMES THREE, USING LEFT INTERNAL MAMMARY ARTERY, RIGHT GREATER SAPHENOUS VEIN HARVESTED ENDOSCOPICALLY;  Surgeon: Loreli Slot, MD;  Location: [facility] OR;  Service: Open Heart Surgery;  Laterality: N/A;  . CYSTOSCOPY W/ URETERAL STENT PLACEMENT Right 02/04/2017   Procedure: CYSTOSCOPY WITH STENT REPLACEMENT;  Surgeon: [name], MD;  Location: [facility] ORS;  Service: Urology;  Laterality: Right;  . CYSTOSCOPY W/ URETERAL STENT REMOVAL Left 02/04/2017   Procedure: CYSTOSCOPY WITH STENT REMOVAL;  Surgeon: [name], MD;  Location: [facility] ORS;  Service: Urology;  Laterality: Left;  . CYSTOSCOPY WITH STENT PLACEMENT Bilateral 01/17/2017   Procedure: CYSTOSCOPY WITH STENT PLACEMENT;  Surgeon: [name], MD;  Location: [facility] ORS;  Service: Urology;  Laterality: Bilateral;  . EYE SURGERY     multiple eye surgeries as a child  . INTRA-AORTIC BALLOON PUMP INSERTION  N/A 11/15/2014   Procedure: INTRA-AORTIC BALLOON PUMP INSERTION;  Surgeon: Lennette Bihari, MD;  Location: [facility] CATH LAB;  Service: Cardiovascular;  Laterality: N/A;  . LEFT HEART CATHETERIZATION WITH CORONARY ANGIOGRAM N/A 11/14/2014   Procedure: LEFT HEART CATHETERIZATION WITH CORONARY ANGIOGRAM;  Surgeon: Lennette Bihari, MD;  Location: [facility] CATH LAB;  Service: Cardiovascular;  Laterality: N/A;  . PERCUTANEOUS CORONARY STENT INTERVENTION (PCI-S) N/A 11/15/2014   Procedure: PERCUTANEOUS CORONARY STENT INTERVENTION (PCI-S);  Surgeon: Lennette Bihari, MD;  Location: [facility] CATH LAB;  Service: Cardiovascular;  Laterality: N/A;  . STOMACH SURGERY     at 51 weeks old  . TEE WITHOUT CARDIOVERSION N/A 11/15/2014   Procedure: TRANSESOPHAGEAL ECHOCARDIOGRAM (TEE);  Surgeon: Loreli Slot, MD;  Location: [facility] OR;  Service: Open Heart Surgery;  Laterality: N/A;  . URETEROSCOPY WITH HOLMIUM LASER LITHOTRIPSY Bilateral 01/17/2017   Procedure: URETEROSCOPY WITH HOLMIUM LASER  LITHOTRIPSY;  Surgeon: [name], MD;  Location: [facility] ORS;  Service: Urology;  Laterality: Bilateral;  . URETEROSCOPY WITH HOLMIUM LASER LITHOTRIPSY Right 02/04/2017   Procedure: URETEROSCOPY WITH HOLMIUM LASER LITHOTRIPSY;  Surgeon: [name], MD;  Location: [facility] ORS;  Service: Urology;  Laterality: Right;     Current Outpatient Medications  Medication Sig Dispense Refill  . aspirin EC 81 MG tablet Take 1 tablet (81 mg total) by mouth daily. 90 tablet 3  . rosuvastatin (CRESTOR) 10 MG tablet Take 1 tablet (10 mg total) by mouth daily. 90 tablet 3   No current facility-administered medications for this visit.    Allergies:   Coconut oil    Social History:  The patient  reports that he has never smoked. He has never used smokeless tobacco. He reports current alcohol use. He reports that he does not use drugs.   Family History:  The patient's family history includes Heart attack in his father; Heart disease in his mother; Nephrolithiasis in his mother; Stroke in his father and mother.    ROS:  Please see the history of present illness.      All other systems are reviewed and negative.    PHYSICAL EXAM:   Physical Exam: Blood pressure 122/84, pulse 78, height 5\' 11"  (1.803 m), weight 274 lb 4 oz (124.4 kg), SpO2 95 %.  GEN:  Middle age male,  Moderately obese.  HEENT: Normal NECK: No JVD; No carotid bruits LYMPHATICS: No lymphadenopathy CARDIAC: RRR , no murmurs, rubs, gallops RESPIRATORY:  Clear to auscultation without rales, wheezing or rhonchi  ABDOMEN: Soft, non-tender, non-distended MUSCULOSKELETAL:  No edema; No deformity  SKIN: Warm and dry NEUROLOGIC:  Alert and oriented x 3  EKG:     December 10, 2019: Normal sinus rhythm at 76.  Old anterior wall myocardial infarction.  No acute ST or T wave changes.   Recent Labs: No results found for requested labs within last 8760 hours.    Lipid Panel    Component Value Date/Time   CHOL 204 (H) 03/16/2018 0740    TRIG 156 (H) 03/16/2018 0740   HDL 31 (L) 03/16/2018 0740   CHOLHDL 6.6 (H) 03/16/2018 0740   CHOLHDL 4.8 01/26/2016 0807   VLDL 32 (H) 01/26/2016 0807   LDLCALC 142 (H) 03/16/2018 0740      Wt Readings from Last 3 Encounters:  12/10/19 274 lb 4 oz (124.4 kg)  06/02/19 245 lb (111.1 kg)  03/10/18 258 lb (117 kg)      Other studies Reviewed: Additional studies/ records that were reviewed today include: . Review  of the above records demonstrates:    ASSESSMENT AND PLAN:  1.  Coronary artery disease: He status post coronary artery bypass grafting. -LIMA to LAD -SVG to DIAGONAL 1 -SVG to OM1  Seems to be doing well  No angia. Cont . ASA Restart statin Needs to work out more and watch his diet .   2. Chronic systolic congestive heart failure:   No CHF symptoms . Cont meds.   . Avoids salt    3. Hyperlipidemia:   He had stopped his atorvastatin .   Will check labs today .  Start rosuvastatin 10 mg a day .       Current medicines are reviewed at length with the patient today.  The patient does not have concerns regarding medicines.  The following changes have been made:  no change  Labs/ tests ordered today include:   Orders Placed This Encounter  Procedures  . Lipid Profile  . Lipid Profile  . Basic Metabolic Panel (BMET)  . Basic Metabolic Panel (BMET)  . Hepatic function panel  . Hepatic function panel  . EKG 12-Lead    Disposition:       Mertie Moores, MD  12/10/2019 9:57 AM    [facility] [address], [place], [place]  [address] Phone: [phone]; Fax: [phone]

## 2019-12-10 ENCOUNTER — Encounter: Payer: Self-pay | Admitting: Cardiovascular Disease

## 2019-12-10 ENCOUNTER — Other Ambulatory Visit: Payer: Self-pay

## 2019-12-10 ENCOUNTER — Ambulatory Visit (INDEPENDENT_AMBULATORY_CARE_PROVIDER_SITE_OTHER): Payer: BC Managed Care – PPO | Admitting: Cardiovascular Disease

## 2019-12-10 VITALS — BP 122/84 | HR 78 | Ht 71.0 in | Wt 274.2 lb

## 2019-12-10 DIAGNOSIS — I5022 Chronic systolic (congestive) heart failure: Secondary | ICD-10-CM | POA: Diagnosis not present

## 2019-12-10 DIAGNOSIS — I2510 Atherosclerotic heart disease of native coronary artery without angina pectoris: Secondary | ICD-10-CM | POA: Diagnosis not present

## 2019-12-10 DIAGNOSIS — E785 Hyperlipidemia, unspecified: Secondary | ICD-10-CM

## 2019-12-10 DIAGNOSIS — I10 Essential (primary) hypertension: Secondary | ICD-10-CM

## 2019-12-10 LAB — BASIC METABOLIC PANEL
BUN/Creatinine Ratio: 11 (ref 9–20)
BUN: 12 mg/dL (ref 6–24)
CO2: 25 mmol/L (ref 20–29)
Calcium: 9.6 mg/dL (ref 8.7–10.2)
Chloride: 99 mmol/L (ref 96–106)
Creatinine, Ser: 1.09 mg/dL (ref 0.76–1.27)
GFR calc Af Amer: 85 mL/min/{1.73_m2} (ref >59)
GFR calc non Af Amer: 74 mL/min/{1.73_m2} (ref >59)
Glucose: 245 mg/dL — ABNORMAL HIGH (ref 65–99)
Potassium: 4.3 mmol/L (ref 3.5–5.2)
Sodium: 136 mmol/L (ref 134–144)

## 2019-12-10 LAB — LIPID PANEL
Chol/HDL Ratio: 7.4 ratio — ABNORMAL HIGH (ref 0.0–5.0)
Cholesterol, Total: 216 mg/dL — ABNORMAL HIGH (ref 100–199)
HDL: 29 mg/dL — ABNORMAL LOW (ref >39)
LDL Chol Calc (NIH): 153 mg/dL — ABNORMAL HIGH (ref 0–99)
Triglycerides: 183 mg/dL — ABNORMAL HIGH (ref 0–149)
VLDL Cholesterol Cal: 34 mg/dL (ref 5–40)

## 2019-12-10 LAB — HEPATIC FUNCTION PANEL
ALT: 119 IU/L — ABNORMAL HIGH (ref 0–44)
AST: 86 IU/L — ABNORMAL HIGH (ref 0–40)
Albumin: 4.3 g/dL (ref 3.8–4.9)
Alkaline Phosphatase: 159 IU/L — ABNORMAL HIGH (ref 39–117)
Bilirubin Total: 1 mg/dL (ref 0.0–1.2)
Bilirubin, Direct: 0.24 mg/dL (ref 0.00–0.40)
Total Protein: 7.2 g/dL (ref 6.0–8.5)

## 2019-12-10 MED ORDER — ROSUVASTATIN CALCIUM 10 MG PO TABS: 10.0000 mg | ORAL_TABLET | Freq: Every day | ORAL | 3 refills | Status: DC

## 2019-12-10 NOTE — Patient Instructions (Addendum)
Medication Instructions:  Your physician has recommended you make the following change in your medication:  START Rosuvastatin (Crestor) 10 mg once daily  *If you need a refill on your cardiac medications before your next appointment, please call your pharmacy*   Lab Work: TODAY - cholesterol, liver panel, kidney function/electrolytes And again in 3 months on July 28 - come in fasting for repeat lab work If you have labs (blood work) drawn today and your tests are completely normal, you will receive your results only by: Marland Kitchen MyChart Message (if you have MyChart) OR . A paper copy in the mail If you have any lab test that is abnormal or we need to change your treatment, we will call you to review the results.   Testing/Procedures: None Ordered   Follow-Up: At [facility], you and your health needs are our priority.  As part of our continuing mission to provide you with exceptional heart care, we have created designated Provider Care Teams.  These Care Teams include your primary Cardiologist (physician) and Advanced Practice Providers (APPs -  Physician Assistants and Nurse Practitioners) who all work together to provide you with the care you need, when you need it.  We recommend signing up for the patient portal called "MyChart".  Sign up information is provided on this After Visit Summary.  MyChart is used to connect with patients for Virtual Visits (Telemedicine).  Patients are able to view lab/test results, encounter notes, upcoming appointments, etc.  Non-urgent messages can be sent to your provider as well.   To learn more about what you can do with MyChart, go to [email or web address].    Your next appointment:   1 year(s)   The format for your next appointment:   In Person  Provider:   You may see Kristeen Miss, MD or one of the following Advanced Practice Providers on your designated Care Team:    Tereso Newcomer, PA-C  Vin Webber, [place]  Berton Bon, [place]

## 2019-12-13 ENCOUNTER — Telehealth: Payer: Self-pay | Admitting: Nurse Practitioner

## 2019-12-13 MED ORDER — METFORMIN HCL ER 500 MG PO TB24: 500.0000 mg | ORAL_TABLET | Freq: Two times a day (BID) | ORAL | 11 refills | Status: DC

## 2019-12-13 NOTE — Telephone Encounter (Signed)
Reviewed lab results and plan of care with patient. He verbalized understanding and agreement with plan and states he has not seen his PCP in a long time. He agrees to start rosuvastatin 10 mg (given at office visit on 4/23) and to start Metformin XR 500 mg twice daily. I reviewed healthy diet and exercise plan and asked him to schedule an appointment with his PCP. He also has elevated AST/ALT/alk phos. He states he does not drink alcohol and does not have hx of fatty liver disease. I rescheduled his 3 month lab appointment for 8 weeks so that we can closely monitor liver enzymes and again emphasized the importance of an appointment with PCP. I advised him to call back with questions or concerns and sent metformin to his pharmacy. He thanked me for the call.

## 2019-12-13 NOTE — Telephone Encounter (Signed)
-----   Message from Vesta Mixer, MD sent at 12/10/2019  5:13 PM EDT ----- Lipids are very elevated.  We started Rosuvastatin 10 mg a day today  Glucose is elevated.  Please also start Metformin 500 mg po twice a day  He needs to see his primary MD for further management of his hyperglycemia. He needs to have a HBA1C drawn in the near future.  We have discussed the importance of better diet, weight loss. Exercise

## 2020-02-09 ENCOUNTER — Other Ambulatory Visit: Payer: BC Managed Care – PPO

## 2020-03-15 ENCOUNTER — Other Ambulatory Visit: Payer: BC Managed Care – PPO

## 2020-03-24 ENCOUNTER — Telehealth: Payer: Self-pay | Admitting: Cardiovascular Disease

## 2020-03-24 NOTE — Telephone Encounter (Signed)
Spoke with the patient and his wife. They are currently in [place], [place] and have been helping their daughter move in this morning. He had lunch and afterwards began to feel poorly. He states that he had hot sauce on his meal and at first contributed it to that. Patient states that he has been having active chest pain and is very short of breath. He is unsure of his blood pressure and heart rate but he feels like his blood pressure is elevated. He does not have any nitroglycerin. They are currently in the car, advised them to go to the ER for evaluation.

## 2020-03-24 NOTE — Telephone Encounter (Signed)
I agree with the plan for Seth Riddle to go to the local eR

## 2020-03-24 NOTE — Telephone Encounter (Signed)
Pt c/o of Chest Pain: STAT if CP now or developed within 24 hours  1. Are you having CP right now? Yes, a little bit   2. Are you experiencing any other symptoms (ex. SOB, nausea, vomiting, sweating)? SOB   3. How long have you been experiencing CP? 20 mins   4. Is your CP continuous or coming and going? continuous  5. Have you taken Nitroglycerin? Doesn't have any.  ?

## 2020-03-25 ENCOUNTER — Telehealth: Payer: Self-pay | Admitting: Student

## 2020-03-25 NOTE — Telephone Encounter (Signed)
   Patient's wife, Stark Bray, called Answering Service with update regarding patient. Called and spoke with wife. Patient admitted to [facility] in [place] after presenting with chest pain and plan is for cardiac catheterization on Monday. Wife wanted to know whether patient should be transferred to [facility]. Explained that I think it would be fine for patient to have procedure done there. She wanted to know whether transfer would be possible if they would much rather have the procedure done here. I advised her to speak with attending at [facility] to see if that would even be an option. Explained that attending would have to speak with physician here who have to accept patient. Patient voiced understanding and thanked me for calling.   Patient's wife wanted to make sure Dr. Elease Hashimoto was updated, so I will route message to him so that he is aware.  Corrin Parker, PA-C 03/25/2020 11:43 AM

## 2020-04-03 ENCOUNTER — Other Ambulatory Visit: Payer: Self-pay | Admitting: *Deleted

## 2020-04-03 DIAGNOSIS — I214 Non-ST elevation (NSTEMI) myocardial infarction: Secondary | ICD-10-CM

## 2020-04-03 DIAGNOSIS — Z955 Presence of coronary angioplasty implant and graft: Secondary | ICD-10-CM

## 2021-07-09 ENCOUNTER — Observation Stay
Admission: EM | Admit: 2021-07-09 | Discharge: 2021-07-11 | Disposition: A | Discharge status: Home / Self Care | Payer: BC Managed Care – PPO | Attending: Internal Medicine | Admitting: Internal Medicine

## 2021-07-09 ENCOUNTER — Emergency Department: Payer: BC Managed Care – PPO

## 2021-07-09 ENCOUNTER — Other Ambulatory Visit: Payer: Self-pay

## 2021-07-09 DIAGNOSIS — Z8052 Family history of malignant neoplasm of bladder: Secondary | ICD-10-CM | POA: Diagnosis not present

## 2021-07-09 DIAGNOSIS — I25111 Atherosclerotic heart disease of native coronary artery with angina pectoris with documented spasm: Secondary | ICD-10-CM

## 2021-07-09 DIAGNOSIS — Z79899 Other long term (current) drug therapy: Secondary | ICD-10-CM | POA: Insufficient documentation

## 2021-07-09 DIAGNOSIS — Z8042 Family history of malignant neoplasm of prostate: Secondary | ICD-10-CM | POA: Insufficient documentation

## 2021-07-09 DIAGNOSIS — E1165 Type 2 diabetes mellitus with hyperglycemia: Secondary | ICD-10-CM | POA: Diagnosis not present

## 2021-07-09 DIAGNOSIS — E785 Hyperlipidemia, unspecified: Secondary | ICD-10-CM | POA: Diagnosis not present

## 2021-07-09 DIAGNOSIS — Z7984 Long term (current) use of oral hypoglycemic drugs: Secondary | ICD-10-CM | POA: Insufficient documentation

## 2021-07-09 DIAGNOSIS — R778 Other specified abnormalities of plasma proteins: Secondary | ICD-10-CM

## 2021-07-09 DIAGNOSIS — Z8051 Family history of malignant neoplasm of kidney: Secondary | ICD-10-CM | POA: Insufficient documentation

## 2021-07-09 DIAGNOSIS — I214 Non-ST elevation (NSTEMI) myocardial infarction: Principal | ICD-10-CM | POA: Insufficient documentation

## 2021-07-09 DIAGNOSIS — Z8349 Family history of other endocrine, nutritional and metabolic diseases: Secondary | ICD-10-CM | POA: Diagnosis not present

## 2021-07-09 DIAGNOSIS — I2510 Atherosclerotic heart disease of native coronary artery without angina pectoris: Secondary | ICD-10-CM | POA: Insufficient documentation

## 2021-07-09 DIAGNOSIS — I110 Hypertensive heart disease with heart failure: Secondary | ICD-10-CM | POA: Diagnosis not present

## 2021-07-09 DIAGNOSIS — Z20822 Contact with and (suspected) exposure to covid-19: Secondary | ICD-10-CM | POA: Diagnosis not present

## 2021-07-09 DIAGNOSIS — I5022 Chronic systolic (congestive) heart failure: Secondary | ICD-10-CM | POA: Insufficient documentation

## 2021-07-09 DIAGNOSIS — Z7982 Long term (current) use of aspirin: Secondary | ICD-10-CM | POA: Diagnosis not present

## 2021-07-09 DIAGNOSIS — R079 Chest pain, unspecified: Secondary | ICD-10-CM

## 2021-07-09 LAB — RESP PANEL BY RT-PCR (FLU A&B, COVID) ARPGX2
Influenza A by PCR: NEGATIVE
Influenza B by PCR: NEGATIVE
SARS Coronavirus 2 by RT PCR: NEGATIVE

## 2021-07-09 LAB — BASIC METABOLIC PANEL
Anion gap: 6 (ref 5–15)
BUN: 12 mg/dL (ref 6–20)
CO2: 27 mmol/L (ref 22–32)
Calcium: 9.5 mg/dL (ref 8.9–10.3)
Chloride: 100 mmol/L (ref 98–111)
Creatinine, Ser: 1.12 mg/dL (ref 0.61–1.24)
GFR, Estimated: >60 mL/min (ref >60)
Glucose, Bld: 290 mg/dL — ABNORMAL HIGH (ref 70–99)
Potassium: 4.2 mmol/L (ref 3.5–5.1)
Sodium: 133 mmol/L — ABNORMAL LOW (ref 135–145)

## 2021-07-09 LAB — CBC
HCT: 45.6 % (ref 39.0–52.0)
Hemoglobin: 15.7 g/dL (ref 13.0–17.0)
MCH: 30.5 pg (ref 26.0–34.0)
MCHC: 34.4 g/dL (ref 30.0–36.0)
MCV: 88.7 fL (ref 80.0–100.0)
Platelets: 164 10*3/uL (ref 150–400)
RBC: 5.14 MIL/uL (ref 4.22–5.81)
RDW: 12 % (ref 11.5–15.5)
WBC: 6.9 10*3/uL (ref 4.0–10.5)
nRBC: 0 % (ref 0.0–0.2)

## 2021-07-09 LAB — TROPONIN I (HIGH SENSITIVITY)
Troponin I (High Sensitivity): 13 ng/L (ref <18)
Troponin I (High Sensitivity): 49 ng/L — ABNORMAL HIGH (ref <18)

## 2021-07-09 MED ORDER — ONDANSETRON HCL 4 MG PO TABS: 4.0000 mg | ORAL_TABLET | Freq: Four times a day (QID) | ORAL | Status: DC | PRN

## 2021-07-09 MED ORDER — TRAZODONE HCL 50 MG PO TABS
25.0000 mg | ORAL_TABLET | Freq: Every evening | ORAL | Status: DC | PRN
  Administered 2021-07-10: 25 mg via ORAL
  Filled 2021-07-09: qty 1

## 2021-07-09 MED ORDER — ENOXAPARIN SODIUM 60 MG/0.6ML IJ SOSY
0.5000 mg/kg | PREFILLED_SYRINGE | INTRAMUSCULAR | Status: DC
  Administered 2021-07-09: 60 mg via SUBCUTANEOUS
  Filled 2021-07-09: qty 0.6

## 2021-07-09 MED ORDER — ASPIRIN EC 81 MG PO TBEC
81.0000 mg | DELAYED_RELEASE_TABLET | Freq: Every day | ORAL | Status: DC
  Administered 2021-07-09 – 2021-07-11 (×2): 81 mg via ORAL
  Filled 2021-07-09 (×2): qty 1

## 2021-07-09 MED ORDER — MAGNESIUM HYDROXIDE 400 MG/5ML PO SUSP: 30.0000 mL | Freq: Every day | ORAL | Status: DC | PRN

## 2021-07-09 MED ORDER — ACETAMINOPHEN 325 MG PO TABS: 650.0000 mg | ORAL_TABLET | Freq: Four times a day (QID) | ORAL | Status: DC | PRN

## 2021-07-09 MED ORDER — ROSUVASTATIN CALCIUM 10 MG PO TABS
10.0000 mg | ORAL_TABLET | Freq: Every day | ORAL | Status: DC
  Administered 2021-07-09: 10 mg via ORAL
  Filled 2021-07-09 (×3): qty 1

## 2021-07-09 MED ORDER — ONDANSETRON HCL 4 MG/2ML IJ SOLN: 4.0000 mg | Freq: Four times a day (QID) | INTRAMUSCULAR | Status: DC | PRN

## 2021-07-09 MED ORDER — INSULIN ASPART 100 UNIT/ML IJ SOLN
0.0000 [IU] | Freq: Three times a day (TID) | INTRAMUSCULAR | Status: DC
Start: 2021-07-10 — End: 2021-07-11
  Administered 2021-07-10 – 2021-07-11 (×2): 3 [IU] via SUBCUTANEOUS
  Administered 2021-07-11: 5 [IU] via SUBCUTANEOUS
  Filled 2021-07-09 (×3): qty 1

## 2021-07-09 MED ORDER — ACETAMINOPHEN 650 MG RE SUPP
650.0000 mg | Freq: Four times a day (QID) | RECTAL | Status: DC | PRN
  Filled 2021-07-09: qty 1

## 2021-07-09 MED ORDER — SODIUM CHLORIDE 0.9 % IV SOLN: INTRAVENOUS | Status: DC

## 2021-07-09 NOTE — ED Triage Notes (Signed)
Pt c/o sudden onset chest pain and jaw pain that started about PTA. Pt has a hx of open heart and stent placement. Denies N/V/Diaphoresis

## 2021-07-09 NOTE — ED Provider Notes (Signed)
[facility] Emergency Department Provider Note   ____________________________________________   Event Date/Time   First MD Initiated Contact with Patient 07/09/21 1954     (approximate)  I have reviewed the triage vital signs and the nursing notes.   HISTORY  Chief Complaint Chest Pain    HPI A 60 year old patient presents for evaluation of chest pain. Initial onset of pain was more than 6 hours ago. The patient's chest pain is described as heaviness/pressure/tightness and is not worse with exertion. The patient's chest pain is middle- or left-sided, is not well-localized, is not sharp and does radiate to the arms/jaw/neck. The patient does not complain of nausea and denies diaphoresis. The patient has no history of stroke, has no history of peripheral artery disease, has not smoked in the past 90 days, denies any history of treated diabetes, has no relevant family history of coronary artery disease (first degree relative at less than age 60), is not hypertensive, has no history of hypercholesterolemia and does not have an elevated BMI (>=30).   Patient reports chest pain is now resolved.  Resolved while in the waiting room.  He took just over 800 mg of aspirin via BC powder packet prior to arrival.  Pain occurred while eating.  Radiated towards his left neck.  Is now resolved.  Very similar to when he had a previous heart attack in its location but intensity has gone down and it is now resolved.  Past Medical History:  Diagnosis Date   Coronary artery disease    Diabetes mellitus without complication (HCC)    Dyslipidemia (high LDL; low HDL)    Family history of premature CAD    Headache    History of kidney stones    Low HDL (under 40)    MI (myocardial infarction) (HCC) 2016   PONV (postoperative nausea and vomiting)    as a child eye surgery    Patient Active Problem List   Diagnosis Date Noted   Chest pain 07/09/2021   Essential hypertension  05/05/2016   Chronic systolic CHF (congestive heart failure) (HCC) 01/05/2015   3-vessel coronary artery disease 12/23/2014   Diabetes mellitus without complication (HCC) 12/23/2014   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   ST elevation (STEMI) myocardial infarction involving left anterior descending coronary artery (HCC)    Dyslipidemia (high LDL; low HDL)    Family history of premature CAD    Acute chest pain    NSTEMI (non-ST elevated myocardial infarction) (HCC) 11/13/2014   Plantar fasciitis, right 12/10/2012    Past Surgical History:  Procedure Laterality Date   CORONARY ARTERY BYPASS GRAFT N/A 11/15/2014   Procedure: CORONARY ARTERY BYPASS GRAFTING (CABG), ON PUMP, TIMES THREE, USING LEFT INTERNAL MAMMARY ARTERY, RIGHT GREATER SAPHENOUS VEIN HARVESTED ENDOSCOPICALLY;  Surgeon: Loreli Slot, MD;  Location: [facility] OR;  Service: Open Heart Surgery;  Laterality: N/A;   CYSTOSCOPY W/ URETERAL STENT PLACEMENT Right 02/04/2017   Procedure: CYSTOSCOPY WITH STENT REPLACEMENT;  Surgeon: [name], MD;  Location: [facility] ORS;  Service: Urology;  Laterality: Right;   CYSTOSCOPY W/ URETERAL STENT REMOVAL Left 02/04/2017   Procedure: CYSTOSCOPY WITH STENT REMOVAL;  Surgeon: [name], MD;  Location: [facility] ORS;  Service: Urology;  Laterality: Left;   CYSTOSCOPY WITH STENT PLACEMENT Bilateral 01/17/2017   Procedure: CYSTOSCOPY WITH STENT PLACEMENT;  Surgeon: [name], MD;  Location: [facility] ORS;  Service: Urology;  Laterality: Bilateral;   EYE SURGERY     multiple eye surgeries as a child  INTRA-AORTIC BALLOON PUMP INSERTION N/A 11/15/2014   Procedure: INTRA-AORTIC BALLOON PUMP INSERTION;  Surgeon: Lennette Bihari, MD;  Location: [facility] CATH LAB;  Service: Cardiovascular;  Laterality: N/A;   LEFT HEART CATHETERIZATION WITH CORONARY ANGIOGRAM N/A 11/14/2014   Procedure: LEFT HEART CATHETERIZATION WITH CORONARY ANGIOGRAM;  Surgeon: Lennette Bihari, MD;  Location: [facility] CATH LAB;  Service: Cardiovascular;   Laterality: N/A;   PERCUTANEOUS CORONARY STENT INTERVENTION (PCI-S) N/A 11/15/2014   Procedure: PERCUTANEOUS CORONARY STENT INTERVENTION (PCI-S);  Surgeon: Lennette Bihari, MD;  Location: [facility] CATH LAB;  Service: Cardiovascular;  Laterality: N/A;   STOMACH SURGERY     at 71 weeks old   TEE WITHOUT CARDIOVERSION N/A 11/15/2014   Procedure: TRANSESOPHAGEAL ECHOCARDIOGRAM (TEE);  Surgeon: Loreli Slot, MD;  Location: [facility] OR;  Service: Open Heart Surgery;  Laterality: N/A;   URETEROSCOPY WITH HOLMIUM LASER LITHOTRIPSY Bilateral 01/17/2017   Procedure: URETEROSCOPY WITH HOLMIUM LASER LITHOTRIPSY;  Surgeon: [name], MD;  Location: [facility] ORS;  Service: Urology;  Laterality: Bilateral;   URETEROSCOPY WITH HOLMIUM LASER LITHOTRIPSY Right 02/04/2017   Procedure: URETEROSCOPY WITH HOLMIUM LASER LITHOTRIPSY;  Surgeon: [name], MD;  Location: [facility] ORS;  Service: Urology;  Laterality: Right;    Prior to Admission medications   Medication Sig Start Date End Date Taking? Authorizing Provider  aspirin EC 81 MG tablet Take 1 tablet (81 mg total) by mouth daily. 01/05/15   Nahser, Deloris Ping, MD  metFORMIN (GLUCOPHAGE-XR) 500 MG 24 hr tablet Take 1 tablet (500 mg total) by mouth 2 (two) times daily. 12/13/19   Nahser, Deloris Ping, MD  rosuvastatin (CRESTOR) 10 MG tablet Take 1 tablet (10 mg total) by mouth daily. 12/10/19   Nahser, Deloris Ping, MD    Allergies Coconut oil  Family History  Problem Relation Age of Onset   Heart disease Mother    Stroke Mother    Nephrolithiasis Mother    Heart attack Father    Stroke Father    Kidney cancer Neg Hx    Bladder Cancer Neg Hx    Prostate cancer Neg Hx     Social History Social History   Tobacco Use   Smoking status: Never   Smokeless tobacco: Never  Vaping Use   Vaping Use: Never used  Substance Use Topics   Alcohol use: Yes    Alcohol/week: 0.0 - 1.0 standard drinks    Comment: 1 glass of wine every 3 months   Drug use: No    Review of  Systems Constitutional: No fever/chills Eyes: No visual changes. ENT: No sore throat.  He did have a sinus infection which she saw urgent care over the weekend and started antibiotic and this has improved.  Still has just a slight lingering cough Cardiovascular: Denies chest pain. Respiratory: Denies shortness of breath.  Slight lingering cough that is notably better over the last couple of days after being on antibiotic for a sinus infection Gastrointestinal: No abdominal pain.   Musculoskeletal: Negative for back pain. Skin: Negative for rash. Neurological: Negative for headaches, areas of focal weakness or numbness.    ____________________________________________   PHYSICAL EXAM:  VITAL SIGNS: ED Triage Vitals  Enc Vitals Group     BP 07/09/21 1441 (!) 150/94     Pulse Rate 07/09/21 1441 82     Resp 07/09/21 1441 18     Temp 07/09/21 1441 98.5 F (36.9 C)     Temp Source 07/09/21 1441 Oral     SpO2 07/09/21 1441 95 %  Weight 07/09/21 1443 265 lb (120.2 kg)     Height 07/09/21 1443 5\' 11"  (1.803 m)     Head Circumference --      Peak Flow --      Pain Score --      Pain Loc --      Pain Edu? --      Excl. in [place]? --     Constitutional: Alert and oriented. Well appearing and in no acute distress. Eyes: Conjunctivae are normal. Head: Atraumatic. Nose: No congestion/rhinnorhea. Mouth/Throat: Mucous membranes are moist. Neck: No stridor.  Cardiovascular: Normal rate, regular rhythm. Grossly normal heart sounds.  Good peripheral circulation. Respiratory: Normal respiratory effort.  No retractions. Lungs CTAB. Gastrointestinal: Soft and nontender. No distention. Musculoskeletal: No lower extremity tenderness nor edema. Neurologic:  Normal speech and language. No gross focal neurologic deficits are appreciated.  Skin:  Skin is warm, dry and intact. No rash noted. Psychiatric: Mood and affect are normal. Speech and behavior are  normal.  ____________________________________________   LABS (all labs ordered are listed, but only abnormal results are displayed)  Labs Reviewed  BASIC METABOLIC PANEL - Abnormal; Notable for the following components:      Result Value   Sodium 133 (*)    Glucose, Bld 290 (*)    All other components within normal limits  TROPONIN I (HIGH SENSITIVITY) - Abnormal; Notable for the following components:   Troponin I (High Sensitivity) 49 (*)    All other components within normal limits  RESP PANEL BY RT-PCR (FLU A&B, COVID) ARPGX2  CBC  HEMOGLOBIN [id number]  BASIC METABOLIC PANEL  CBC  TROPONIN I (HIGH SENSITIVITY)   ____________________________________________  EKG  Reviewed inter by me at 1425 9 heart rate 5 QRS 99 QTc 420 Normal sinus rhythm, Q-wave old infarct suspected of septal nature.  No STEMI ____________________________________________  RADIOLOGY  Chest x-ray reviewed negative for acute finding ____________________________________________   PROCEDURES  Procedure(s) performed: None  Procedures  Critical Care performed: No  ____________________________________________   INITIAL IMPRESSION / ASSESSMENT AND PLAN / ED COURSE  Pertinent labs & imaging results that were available during my care of the patient were reviewed by me and considered in my medical decision making (see chart for details).   Differential diagnosis includes, but is not limited to, ACS, aortic dissection, pulmonary embolism, cardiac tamponade, pneumothorax, pneumonia, pericarditis, myocarditis, GI-related causes including esophagitis/gastritis, and musculoskeletal chest wall pain.     Chest pain now resolved.  Initial troponin reassuring but second troponin shows elevation.  Potentially unstable angina, angina, NSTEMI.  No evidence to support acute ST elevation MI at this time.  Patient is already been treated with salicylate which he took at home, and his chest pain is now resolved.  No  ripping tearing or moving sensation no radiation of the back.  No signs or symptoms that would suggest acute dissection, pulmonary embolism pneumothorax or pleuritic pain.  Suspect most likely ACS, high risk chest pain.  Follows with [facility] cardiology.  Patient and his family who is at the bedside understanding agreeable with plan for admission, chest pain observation, and anticipation of cardiology consultation.  Admission discussed with hospitalist Dr. Sidney Ace      ____________________________________________   FINAL CLINICAL IMPRESSION(S) / ED DIAGNOSES  Final diagnoses:  Chest pain with high risk of acute coronary syndrome        Note:  This document was prepared using Dragon voice recognition software and may include unintentional dictation errors  Delman Kitten, MD 07/09/21 2131

## 2021-07-09 NOTE — H&P (Signed)
[place]   PATIENT NAME: Seth Riddle    MR#:  [id number]  DATE OF BIRTH:  [birth year]  DATE OF ADMISSION:  07/09/2021  PRIMARY CARE PHYSICIAN: Pcp, No   Patient is coming from: Home  REQUESTING/REFERRING PHYSICIAN: Sharyn Creamer, MD  CHIEF COMPLAINT:   Chief Complaint  Patient presents with   Chest Pain    HISTORY OF PRESENT ILLNESS:  Seth Riddle is a 60 y.o. male with medical history significant for coronary artery disease, status post CABG, status post PCI and stent, hypertension, type 2 diabetes mellitus and dyslipidemia, presented to the ER with acute onset of left-sided chest pain with associated bilateral jaw pain.  He describes it as heartburn and graded at 4/10 in severity with no nausea or vomiting or diaphoresis.  He denies any associated palpitations or dyspnea.  He has been having cough without worsening from his baseline.  No wheezing.  No leg pain or edema or recent travels or surgeries.  No bleeding diathesis.  ED Course: When he came to the ER, blood pressure was 150/94 with otherwise normal vital signs.  Labs revealed mild hyponatremia and hyperglycemia of 290.  High-sensitivity troponin was 13 and later 49 and CBC was within normal.  Influenza antigens and COVID-19 PCR came back negative. EKG as reviewed by me : EKG showed normal sinus rhythm with a rate of 85 with Q waves septally Imaging: 2 view chest x-ray showed no acute cardiopulmonary disease.  The patient took 2 BC powder at home.  He will be admitted to an observation cardiac telemetry bed for further evaluation and management. PAST MEDICAL HISTORY:   Past Medical History:  Diagnosis Date   Coronary artery disease    Diabetes mellitus without complication (HCC)    Dyslipidemia (high LDL; low HDL)    Family history of premature CAD    Headache    History of kidney stones    Low HDL (under 40)    MI (myocardial infarction) (HCC) 2016   PONV (postoperative nausea and vomiting)    as  a child eye surgery    PAST SURGICAL HISTORY:   Past Surgical History:  Procedure Laterality Date   CORONARY ARTERY BYPASS GRAFT N/A 11/15/2014   Procedure: CORONARY ARTERY BYPASS GRAFTING (CABG), ON PUMP, TIMES THREE, USING LEFT INTERNAL MAMMARY ARTERY, RIGHT GREATER SAPHENOUS VEIN HARVESTED ENDOSCOPICALLY;  Surgeon: Loreli Slot, MD;  Location: [facility] OR;  Service: Open Heart Surgery;  Laterality: N/A;   CYSTOSCOPY W/ URETERAL STENT PLACEMENT Right 02/04/2017   Procedure: CYSTOSCOPY WITH STENT REPLACEMENT;  Surgeon: [name], MD;  Location: [facility] ORS;  Service: Urology;  Laterality: Right;   CYSTOSCOPY W/ URETERAL STENT REMOVAL Left 02/04/2017   Procedure: CYSTOSCOPY WITH STENT REMOVAL;  Surgeon: [name], MD;  Location: [facility] ORS;  Service: Urology;  Laterality: Left;   CYSTOSCOPY WITH STENT PLACEMENT Bilateral 01/17/2017   Procedure: CYSTOSCOPY WITH STENT PLACEMENT;  Surgeon: [name], MD;  Location: [facility] ORS;  Service: Urology;  Laterality: Bilateral;   EYE SURGERY     multiple eye surgeries as a child   INTRA-AORTIC BALLOON PUMP INSERTION N/A 11/15/2014   Procedure: INTRA-AORTIC BALLOON PUMP INSERTION;  Surgeon: Lennette Bihari, MD;  Location: [facility] CATH LAB;  Service: Cardiovascular;  Laterality: N/A;   LEFT HEART CATHETERIZATION WITH CORONARY ANGIOGRAM N/A 11/14/2014   Procedure: LEFT HEART CATHETERIZATION WITH CORONARY ANGIOGRAM;  Surgeon: Lennette Bihari, MD;  Location: [facility] CATH LAB;  Service: Cardiovascular;  Laterality: N/A;  PERCUTANEOUS CORONARY STENT INTERVENTION (PCI-S) N/A 11/15/2014   Procedure: PERCUTANEOUS CORONARY STENT INTERVENTION (PCI-S);  Surgeon: Lennette Bihari, MD;  Location: [facility] CATH LAB;  Service: Cardiovascular;  Laterality: N/A;   STOMACH SURGERY     at 22 weeks old   TEE WITHOUT CARDIOVERSION N/A 11/15/2014   Procedure: TRANSESOPHAGEAL ECHOCARDIOGRAM (TEE);  Surgeon: Loreli Slot, MD;  Location: [facility] OR;  Service: Open Heart Surgery;   Laterality: N/A;   URETEROSCOPY WITH HOLMIUM LASER LITHOTRIPSY Bilateral 01/17/2017   Procedure: URETEROSCOPY WITH HOLMIUM LASER LITHOTRIPSY;  Surgeon: [name], MD;  Location: [facility] ORS;  Service: Urology;  Laterality: Bilateral;   URETEROSCOPY WITH HOLMIUM LASER LITHOTRIPSY Right 02/04/2017   Procedure: URETEROSCOPY WITH HOLMIUM LASER LITHOTRIPSY;  Surgeon: [name], MD;  Location: [facility] ORS;  Service: Urology;  Laterality: Right;    SOCIAL HISTORY:   Social History   Tobacco Use   Smoking status: Never   Smokeless tobacco: Never  Substance Use Topics   Alcohol use: Yes    Alcohol/week: 0.0 - 1.0 standard drinks    Comment: 1 glass of wine every 3 months    FAMILY HISTORY:   Family History  Problem Relation Age of Onset   Heart disease Mother    Stroke Mother    Nephrolithiasis Mother    Heart attack Father    Stroke Father    Kidney cancer Neg Hx    Bladder Cancer Neg Hx    Prostate cancer Neg Hx     DRUG ALLERGIES:   Allergies  Allergen Reactions   Coconut Oil Hives    REVIEW OF SYSTEMS:   ROS As per history of present illness. All pertinent systems were reviewed above. Constitutional, HEENT, cardiovascular, respiratory, GI, GU, musculoskeletal, neuro, psychiatric, endocrine, integumentary and hematologic systems were reviewed and are otherwise negative/unremarkable except for positive findings mentioned above in the HPI.   MEDICATIONS AT HOME:   Prior to Admission medications   Medication Sig Start Date End Date Taking? Authorizing Provider  aspirin EC 81 MG tablet Take 1 tablet (81 mg total) by mouth daily. 01/05/15   Nahser, Deloris Ping, MD  metFORMIN (GLUCOPHAGE-XR) 500 MG 24 hr tablet Take 1 tablet (500 mg total) by mouth 2 (two) times daily. 12/13/19   Nahser, Deloris Ping, MD  rosuvastatin (CRESTOR) 10 MG tablet Take 1 tablet (10 mg total) by mouth daily. 12/10/19   Nahser, Deloris Ping, MD      VITAL SIGNS:  Blood pressure (!) 129/94, pulse 72,  temperature 97.7 F (36.5 C), temperature source Oral, resp. rate 14, height 5\' 11"  (1.803 m), weight 120.2 kg, SpO2 95 %.  PHYSICAL EXAMINATION:  Physical Exam  GENERAL:  60 y.o.-year-old male patient lying in the bed with no acute distress.  EYES: Pupils equal, round, reactive to light and accommodation. No scleral icterus. Extraocular muscles intact.  HEENT: Head atraumatic, normocephalic. Oropharynx and nasopharynx clear.  NECK:  Supple, no jugular venous distention. No thyroid enlargement, no tenderness.  LUNGS: Normal breath sounds bilaterally, no wheezing, rales,rhonchi or crepitation. No use of accessory muscles of respiration.  CARDIOVASCULAR: Regular rate and rhythm, S1, S2 normal. No murmurs, rubs, or gallops.  ABDOMEN: Soft, nondistended, nontender. Bowel sounds present. No organomegaly or mass.  EXTREMITIES: No pedal edema, cyanosis, or clubbing.  NEUROLOGIC: Cranial nerves II through XII are intact. Muscle strength 5/5 in all extremities. Sensation intact. Gait not checked.  PSYCHIATRIC: The patient is alert and oriented x 3.  Normal affect and good eye contact. SKIN:  No obvious rash, lesion, or ulcer.   LABORATORY PANEL:   CBC Recent Labs  Lab 07/09/21 1450  WBC 6.9  HGB 15.7  HCT 45.6  PLT 164   ------------------------------------------------------------------------------------------------------------------  Chemistries  Recent Labs  Lab 07/09/21 1450  NA 133*  K 4.2  CL 100  CO2 27  GLUCOSE 290*  BUN 12  CREATININE 1.12  CALCIUM 9.5   ------------------------------------------------------------------------------------------------------------------  Cardiac Enzymes No results for input(s): TROPONINI in the last 168 hours. ------------------------------------------------------------------------------------------------------------------  RADIOLOGY:  DG Chest 2 View  Result Date: 07/09/2021 CLINICAL DATA:  Chest pain and jaw pain, sudden onset.  EXAM: CHEST - 2 VIEW COMPARISON:  12/13/2014. FINDINGS: Trachea is midline. Heart is at the upper limits of normal in size, stable. Lungs are clear. No pleural fluid. IMPRESSION: No acute findings. Electronically Signed   By: Leanna Battles M.D.   On: 07/09/2021 15:21      IMPRESSION AND PLAN:  Principal Problem:   Chest pain  1.  Chest pain, rule out ACS.  He has slightly elevated troponin I. - The patient be admitted to observation cardiac monitor bed. -We will follow seria Troponin aspirin - We will place on aspirin as well as as needed sublingual nitroglycerin and morphine sulfate for pain. - Cardiology consultation will be obtained. - I notified Dr. Bufford Buttner about the patient.  2.  Dyslipidemia. - We will continue statin therapy.  3.  Type 2 diabetes mellitus. - We will resume oral antidiabetics except metformin.  4.  Coronary artery disease status post CABG, status post PCI and stent. - We will continue statin therapy as well as beta-blocker therapy with Lopressor and aspirin. DVT prophylaxis: Lovenox. Code Status: full code. Family Communication:  The plan of care was discussed in details with the patient (and family). I answered all questions. The patient agreed to proceed with the above mentioned plan. Further management will depend upon hospital course. Disposition Plan: Back to previous home environment Consults called: Cardiology All the records are reviewed and case discussed with ED provider.  Status is: Observation  ake PO, and Inpatient level of care appropriate due to severity of illness   Dispo: The patient is from: Home              Anticipated d/c is to: Home              Patient currently is not medically stable to d/c.              Difficult to place patient: No  TOTAL TIME TAKING CARE OF THIS PATIENT: 55 minutes.     Hannah Beat M.D on 07/09/2021 at 8:56 PM  [facility]   From 7 PM-7 AM, contact night-coverage [email or web address]  CC: Primary  care physician; Pcp, No

## 2021-07-09 NOTE — Progress Notes (Signed)
PHARMACIST - PHYSICIAN COMMUNICATION  CONCERNING:  Enoxaparin (Lovenox) for DVT Prophylaxis    RECOMMENDATION: Patient was prescribed enoxaparin 40mg  q24 hours for VTE prophylaxis.   Filed Weights   07/09/21 1443  Weight: 120.2 kg (265 lb)    Body mass index is 36.96 kg/m.  Estimated Creatinine Clearance: 92.6 mL/min (by C-G formula based on SCr of 1.12 mg/dL).   Based on [facility] policy patient is candidate for enoxaparin 0.5mg /kg TBW SQ every 24 hours based on BMI being >30.  DESCRIPTION: Pharmacy has adjusted enoxaparin dose per [facility] policy.  Patient is now receiving enoxaparin 60 mg every 24 hours   [facility] 07/09/2021 8:58 PM

## 2021-07-10 ENCOUNTER — Other Ambulatory Visit: Payer: Self-pay

## 2021-07-10 ENCOUNTER — Encounter
Admission: EM | Disposition: A | Discharge status: Home / Self Care | Payer: Self-pay | Source: Home / Self Care | Attending: Emergency Medicine

## 2021-07-10 ENCOUNTER — Encounter: Payer: Self-pay | Admitting: Family Medicine

## 2021-07-10 DIAGNOSIS — I25810 Atherosclerosis of coronary artery bypass graft(s) without angina pectoris: Secondary | ICD-10-CM | POA: Diagnosis not present

## 2021-07-10 DIAGNOSIS — I214 Non-ST elevation (NSTEMI) myocardial infarction: Secondary | ICD-10-CM

## 2021-07-10 DIAGNOSIS — I2510 Atherosclerotic heart disease of native coronary artery without angina pectoris: Secondary | ICD-10-CM | POA: Diagnosis not present

## 2021-07-10 DIAGNOSIS — R079 Chest pain, unspecified: Secondary | ICD-10-CM | POA: Diagnosis not present

## 2021-07-10 DIAGNOSIS — I5022 Chronic systolic (congestive) heart failure: Secondary | ICD-10-CM

## 2021-07-10 HISTORY — PX: LEFT HEART CATH AND CORONARY ANGIOGRAPHY: CATH118249

## 2021-07-10 LAB — CBC
HCT: 42.9 % (ref 39.0–52.0)
Hemoglobin: 14.9 g/dL (ref 13.0–17.0)
MCH: 30.9 pg (ref 26.0–34.0)
MCHC: 34.7 g/dL (ref 30.0–36.0)
MCV: 89 fL (ref 80.0–100.0)
Platelets: 158 10*3/uL (ref 150–400)
RBC: 4.82 MIL/uL (ref 4.22–5.81)
RDW: 12 % (ref 11.5–15.5)
WBC: 7.4 10*3/uL (ref 4.0–10.5)
nRBC: 0 % (ref 0.0–0.2)

## 2021-07-10 LAB — BASIC METABOLIC PANEL
Anion gap: 6 (ref 5–15)
BUN: 13 mg/dL (ref 6–20)
CO2: 26 mmol/L (ref 22–32)
Calcium: 8.8 mg/dL — ABNORMAL LOW (ref 8.9–10.3)
Chloride: 103 mmol/L (ref 98–111)
Creatinine, Ser: 1.11 mg/dL (ref 0.61–1.24)
GFR, Estimated: >60 mL/min (ref >60)
Glucose, Bld: 175 mg/dL — ABNORMAL HIGH (ref 70–99)
Potassium: 3.9 mmol/L (ref 3.5–5.1)
Sodium: 135 mmol/L (ref 135–145)

## 2021-07-10 LAB — GLUCOSE, CAPILLARY
Glucose-Capillary: 189 mg/dL — ABNORMAL HIGH (ref 70–99)
Glucose-Capillary: 167 mg/dL — ABNORMAL HIGH (ref 70–99)
Glucose-Capillary: 173 mg/dL — ABNORMAL HIGH (ref 70–99)
Glucose-Capillary: 151 mg/dL — ABNORMAL HIGH (ref 70–99)

## 2021-07-10 LAB — HEMOGLOBIN A1C
Hgb A1c MFr Bld: 7.5 % — ABNORMAL HIGH (ref 4.8–5.6)
Mean Plasma Glucose: 168.55 mg/dL

## 2021-07-10 SURGERY — LEFT HEART CATH AND CORONARY ANGIOGRAPHY
Anesthesia: Moderate Sedation

## 2021-07-10 MED ORDER — ENOXAPARIN SODIUM 60 MG/0.6ML IJ SOSY
0.5000 mg/kg | PREFILLED_SYRINGE | INTRAMUSCULAR | Status: DC
  Administered 2021-07-11: 60 mg via SUBCUTANEOUS
  Filled 2021-07-10: qty 0.6

## 2021-07-10 MED ORDER — CARVEDILOL 3.125 MG PO TABS
3.1250 mg | ORAL_TABLET | Freq: Two times a day (BID) | ORAL | Status: DC
  Administered 2021-07-10: 3.125 mg via ORAL
  Filled 2021-07-10: qty 1

## 2021-07-10 MED ORDER — HEPARIN SODIUM (PORCINE) 1000 UNIT/ML IJ SOLN
INTRAMUSCULAR | Status: AC
  Filled 2021-07-10: qty 1

## 2021-07-10 MED ORDER — HYDRALAZINE HCL 20 MG/ML IJ SOLN: 10.0000 mg | INTRAMUSCULAR | Status: AC | PRN

## 2021-07-10 MED ORDER — SODIUM CHLORIDE 0.9% FLUSH
3.0000 mL | Freq: Two times a day (BID) | INTRAVENOUS | Status: DC
  Administered 2021-07-10 – 2021-07-11 (×3): 3 mL via INTRAVENOUS

## 2021-07-10 MED ORDER — HEPARIN (PORCINE) IN NACL 1000-0.9 UT/500ML-% IV SOLN
INTRAVENOUS | Status: AC
  Filled 2021-07-10: qty 1000

## 2021-07-10 MED ORDER — CLOPIDOGREL BISULFATE 75 MG PO TABS: 300.0000 mg | ORAL_TABLET | Freq: Once | ORAL | Status: AC

## 2021-07-10 MED ORDER — SODIUM CHLORIDE 0.9% FLUSH
3.0000 mL | Freq: Two times a day (BID) | INTRAVENOUS | Status: DC
  Administered 2021-07-11: 3 mL via INTRAVENOUS

## 2021-07-10 MED ORDER — FUROSEMIDE 10 MG/ML IJ SOLN
INTRAMUSCULAR | Status: AC
  Administered 2021-07-10: 40 mg via INTRAVENOUS
  Filled 2021-07-10: qty 4

## 2021-07-10 MED ORDER — MIDAZOLAM HCL 2 MG/2ML IJ SOLN
INTRAMUSCULAR | Status: DC | PRN
  Administered 2021-07-10 (×2): 1 mg via INTRAVENOUS

## 2021-07-10 MED ORDER — FUROSEMIDE 10 MG/ML IJ SOLN: 40.0000 mg | Freq: Every day | INTRAMUSCULAR | Status: DC

## 2021-07-10 MED ORDER — SODIUM CHLORIDE 0.9 % WEIGHT BASED INFUSION: 3.0000 mL/kg/h | INTRAVENOUS | Status: DC

## 2021-07-10 MED ORDER — PANTOPRAZOLE SODIUM 40 MG PO TBEC
40.0000 mg | DELAYED_RELEASE_TABLET | Freq: Every day | ORAL | Status: DC
  Administered 2021-07-10 – 2021-07-11 (×2): 40 mg via ORAL
  Filled 2021-07-10 (×2): qty 1

## 2021-07-10 MED ORDER — SODIUM CHLORIDE 0.9 % WEIGHT BASED INFUSION
1.0000 mL/kg/h | INTRAVENOUS | Status: DC
  Administered 2021-07-10: 1.04 mL/kg/h via INTRAVENOUS

## 2021-07-10 MED ORDER — CLOPIDOGREL BISULFATE 75 MG PO TABS
ORAL_TABLET | ORAL | Status: AC
  Administered 2021-07-10: 300 mg via ORAL
  Filled 2021-07-10: qty 4

## 2021-07-10 MED ORDER — LIDOCAINE HCL (PF) 1 % IJ SOLN
INTRAMUSCULAR | Status: DC | PRN
  Administered 2021-07-10: 2 mL
  Administered 2021-07-10: 10 mL

## 2021-07-10 MED ORDER — HEPARIN (PORCINE) IN NACL 2000-0.9 UNIT/L-% IV SOLN
INTRAVENOUS | Status: DC | PRN
  Administered 2021-07-10: 1000 mL

## 2021-07-10 MED ORDER — HEPARIN SODIUM (PORCINE) 1000 UNIT/ML IJ SOLN
INTRAMUSCULAR | Status: DC | PRN
  Administered 2021-07-10: 5000 [IU] via INTRAVENOUS

## 2021-07-10 MED ORDER — ASPIRIN 81 MG PO CHEW
CHEWABLE_TABLET | ORAL | Status: AC
  Filled 2021-07-10: qty 1

## 2021-07-10 MED ORDER — FENTANYL CITRATE (PF) 100 MCG/2ML IJ SOLN
INTRAMUSCULAR | Status: DC | PRN
  Administered 2021-07-10 (×2): 25 ug via INTRAVENOUS

## 2021-07-10 MED ORDER — FENTANYL CITRATE (PF) 100 MCG/2ML IJ SOLN
INTRAMUSCULAR | Status: AC
  Filled 2021-07-10: qty 2

## 2021-07-10 MED ORDER — VERAPAMIL HCL 2.5 MG/ML IV SOLN
INTRAVENOUS | Status: AC
  Filled 2021-07-10: qty 2

## 2021-07-10 MED ORDER — SODIUM CHLORIDE 0.9 % IV SOLN: 250.0000 mL | INTRAVENOUS | Status: DC | PRN

## 2021-07-10 MED ORDER — MIDAZOLAM HCL 2 MG/2ML IJ SOLN
INTRAMUSCULAR | Status: AC
  Filled 2021-07-10: qty 2

## 2021-07-10 MED ORDER — SODIUM CHLORIDE 0.9% FLUSH: 3.0000 mL | INTRAVENOUS | Status: DC | PRN

## 2021-07-10 MED ORDER — ASPIRIN 81 MG PO CHEW
81.0000 mg | CHEWABLE_TABLET | ORAL | Status: AC
  Administered 2021-07-10: 81 mg via ORAL

## 2021-07-10 MED ORDER — ENOXAPARIN SODIUM 40 MG/0.4ML IJ SOSY: 40.0000 mg | PREFILLED_SYRINGE | INTRAMUSCULAR | Status: DC

## 2021-07-10 MED ORDER — INSULIN ASPART 100 UNIT/ML IJ SOLN
INTRAMUSCULAR | Status: AC
  Administered 2021-07-10: 3 [IU] via SUBCUTANEOUS
  Filled 2021-07-10: qty 1

## 2021-07-10 MED ORDER — IOHEXOL 350 MG/ML SOLN
INTRAVENOUS | Status: DC | PRN
  Administered 2021-07-10: 60 mL

## 2021-07-10 MED ORDER — LIDOCAINE HCL 1 % IJ SOLN
INTRAMUSCULAR | Status: AC
  Filled 2021-07-10: qty 20

## 2021-07-10 MED ORDER — LOSARTAN POTASSIUM 25 MG PO TABS
12.5000 mg | ORAL_TABLET | Freq: Every day | ORAL | Status: DC
  Administered 2021-07-10: 12.5 mg via ORAL
  Filled 2021-07-10: qty 1

## 2021-07-10 SURGICAL SUPPLY — 18 items
CANNULA 5F STIFF (CANNULA) ×2 IMPLANT
CATH INFINITI 5 FR IM (CATHETERS) ×2 IMPLANT
CATH INFINITI 5 FR MPA2 (CATHETERS) ×2 IMPLANT
CATH INFINITI 5FR JL4 (CATHETERS) ×2 IMPLANT
DEVICE CLOSURE MYNXGRIP 5F (Vascular Products) ×2 IMPLANT
DEVICE RAD COMP TR BAND LRG (VASCULAR PRODUCTS) ×2 IMPLANT
DRAPE BRACHIAL (DRAPES) ×2 IMPLANT
GLIDESHEATH SLEND SS 6F .021 (SHEATH) ×2 IMPLANT
GUIDEWIRE INQWIRE 1.5J.035X260 (WIRE) ×1 IMPLANT
INQWIRE 1.5J .035X260CM (WIRE) ×2
KIT ENCORE 26 ADVANTAGE (KITS) ×2 IMPLANT
PACK CARDIAC CATH (CUSTOM PROCEDURE TRAY) ×2 IMPLANT
PROTECTION STATION PRESSURIZED (MISCELLANEOUS) ×2
SET ATX SIMPLICITY (MISCELLANEOUS) ×2 IMPLANT
SHEATH AVANTI 5FR X 11CM (SHEATH) ×2 IMPLANT
STATION PROTECTION PRESSURIZED (MISCELLANEOUS) ×1 IMPLANT
WIRE G HI TQ BMW 190 (WIRE) ×2 IMPLANT
WIRE HITORQ VERSACORE ST 145CM (WIRE) ×2 IMPLANT

## 2021-07-10 NOTE — Progress Notes (Signed)
PHARMACIST - PHYSICIAN COMMUNICATION  CONCERNING:  Enoxaparin (Lovenox) for DVT Prophylaxis    RECOMMENDATION: Patient was prescribed enoxaprin 40mg  q24 hours for VTE prophylaxis.   Filed Weights   07/09/21 1443  Weight: 120.2 kg (265 lb)    Body mass index is 36.96 kg/m.  Estimated Creatinine Clearance: 93.4 mL/min (by C-G formula based on SCr of 1.11 mg/dL).   Based on [facility] policy patient is candidate for enoxaparin 0.5mg /kg TBW SQ every 24 hours based on BMI being >30.  DESCRIPTION: Pharmacy has adjusted enoxaparin dose per [facility] policy.  Patient is now receiving enoxaparin 60 mg every 24 hours    [facility], PharmD Clinical Pharmacist  07/10/2021 2:28 PM

## 2021-07-10 NOTE — Progress Notes (Signed)
  Progress Note    Seth Riddle   EVO:[id number]  DOB: Mar 31, 1961  DOA: 07/09/2021     0 Date of Service: 07/10/2021   Clinical Course Past medical history of CAD SP CABG, HTN, type II DM, HLD.  Presents with complaints of ongoing chest pain.  No nausea no vomiting.  Cardiology was consulted.  Underwent left heart cardiac catheterization.  Medical management recommended.  Assessment and Plan Chest pain with elevated troponin. Non-STEMI. CAD SP CABG. Acute on chronic HFrEF.  Ischemic cardiomyopathy. HLD. Presented with complains of chest pain.  Troponins were elevated. Neurology was consulted. Patient was taken to cardiac catheterization, acute/subacute thrombus seen in SVG to OM1 graft not amenable for intervention Plan is to continue aspirin and Plavix.  Continue Crestor. Continue diuresis.  Likely EF 25 to 30% Management per cardiology.  Type 2 diabetes mellitus, uncontrolled with hyperglycemia. On metformin.  We will currently hold secondary to cath. Continue sliding scale insulin.  GERD. Occasionally takes antacids. Currently will add PPI.  Subjective:  No nausea no vomiting.  No further chest pain.  No shortness of breath.  Objective Vitals:   07/10/21 1315 07/10/21 1330 07/10/21 1400 07/10/21 1438  BP:  113/84 112/80 103/71  Pulse: 99 84 82 80  Resp: (!) 22 15 18 18   Temp:    98.2 F (36.8 C)  TempSrc:    Oral  SpO2: 96% 94% 95% 95%  Weight:   124.5 kg   Height:   5\' 11"  (1.803 m)    124.5 kg  Exam General: Appear in mild distress, no Rash; Oral Mucosa Clear, moist. no Abnormal Neck Mass Or lumps, Conjunctiva normal  Cardiovascular: S1 and S2 Present, no Murmur, Respiratory: good respiratory effort, Bilateral Air entry present and CTA, no Crackles, no wheezes Abdomen: Bowel Sound present, Soft and no tenderness Extremities: no Pedal edema Neurology: alert and oriented to time, place, and person affect appropriate. no new focal deficit Gait not  checked due to patient safety concerns    Labs / Other Information My review of labs, imaging, notes and other tests shows no new significant findings.    Disposition Plan: Status is: Observation  Patient will discharge tomorrow. Time spent: 35 minutes [facility] 07/10/2021, 7:05 PM

## 2021-07-10 NOTE — Interval H&P Note (Signed)
History and Physical Interval Note:  07/10/2021 9:22 AM  Seth Riddle  has presented today for surgery, with the diagnosis of NSTEMI.  The various methods of treatment have been discussed with the patient and family. After consideration of risks, benefits and other options for treatment, the patient has consented to  Procedure(s): LEFT HEART CATH AND CORONARY ANGIOGRAPHY (N/A) as a surgical intervention.  The patient's history has been reviewed, patient examined, no change in status, stable for surgery.  I have reviewed the patient's chart and labs.  Questions were answered to the patient's satisfaction.    Cath Lab Visit (complete for each Cath Lab visit)  Clinical Evaluation Leading to the Procedure:   ACS: Yes.    Non-ACS:  N/A  [name]

## 2021-07-10 NOTE — ED Notes (Signed)
Patient denies pain and is resting comfortably.  

## 2021-07-10 NOTE — H&P (View-Only) (Signed)
Cardiology Consultation:   Patient ID: DOYL BITTING MRN: [id number]; DOB: 16-Feb-1961  Admit date: 07/09/2021 Date of Consult: 07/10/2021  PCP:  Oneita Hurt No   [facility] Providers Cardiologist:  Kristeen Miss, MD   {    Patient Profile:   Seth Riddle is a 60 y.o. male with a hx of CAD s/p CABG and prior stenting, DM, HLD, chronic systolic heart failure/ischemic cardiomyopathy, medication noncompliance who is being seen 07/10/2021 for the evaluation of chest pain at the request of Dr. Allena Katz.  History of Present Illness:   [name] is followed by Dr. Elease Hashimoto. Hx of CAD initial admission with NSTEMI. Cath showed severe 3V CAD. Developed recurrent chest pain with anterior STEMI in 10/2014. Underwent emergent PCI and IABP followed by emergent CABG (LIMA to LAD, SVG to DIAG1, SVG to OM1). Echo 10/2015 showed LVEF 40-45% (unchanged since CABG), G1DD. Saw Dr. Elease Hashimoto in 05/2016 and reported chest pain, offered stress test, however declined.   Last seen 11/2019 and was doing well. Was only on ASA. Tried to restart statin, Crestor.   Reports he had a stent in [place] in 03/2020.   Presented to the ER for chest pain. Was eating lunch yesterday and started feeling indigestion type pain in the center of his chest. Pain went up to the jaw. Pain lasted an about. 2/10. No associated symptoms. Pain was similar to prior MI. He took [facility] powder, which seemed to improve the pain. Reports he is only on ASA, not on blood pressure or cholesterol medications.  In the ER HS trop 14>49. BP 150/94. Labs showed BG 290, sodium 133. Respiratory panel negative. CXR showed no acute process. Started on IV heparin and admitted.    Past Medical History:  Diagnosis Date   Coronary artery disease    Diabetes mellitus without complication (HCC)    Dyslipidemia (high LDL; low HDL)    Family history of premature CAD    Headache    History of kidney stones    Low HDL (under 40)    MI (myocardial  infarction) (HCC) 2016   PONV (postoperative nausea and vomiting)    as a child eye surgery    Past Surgical History:  Procedure Laterality Date   CORONARY ARTERY BYPASS GRAFT N/A 11/15/2014   Procedure: CORONARY ARTERY BYPASS GRAFTING (CABG), ON PUMP, TIMES THREE, USING LEFT INTERNAL MAMMARY ARTERY, RIGHT GREATER SAPHENOUS VEIN HARVESTED ENDOSCOPICALLY;  Surgeon: Loreli Slot, MD;  Location: [facility] OR;  Service: Open Heart Surgery;  Laterality: N/A;   CYSTOSCOPY W/ URETERAL STENT PLACEMENT Right 02/04/2017   Procedure: CYSTOSCOPY WITH STENT REPLACEMENT;  Surgeon: [name], MD;  Location: [facility] ORS;  Service: Urology;  Laterality: Right;   CYSTOSCOPY W/ URETERAL STENT REMOVAL Left 02/04/2017   Procedure: CYSTOSCOPY WITH STENT REMOVAL;  Surgeon: [name], MD;  Location: [facility] ORS;  Service: Urology;  Laterality: Left;   CYSTOSCOPY WITH STENT PLACEMENT Bilateral 01/17/2017   Procedure: CYSTOSCOPY WITH STENT PLACEMENT;  Surgeon: [name], MD;  Location: [facility] ORS;  Service: Urology;  Laterality: Bilateral;   EYE SURGERY     multiple eye surgeries as a child   INTRA-AORTIC BALLOON PUMP INSERTION N/A 11/15/2014   Procedure: INTRA-AORTIC BALLOON PUMP INSERTION;  Surgeon: Lennette Bihari, MD;  Location: [facility] CATH LAB;  Service: Cardiovascular;  Laterality: N/A;   LEFT HEART CATHETERIZATION WITH CORONARY ANGIOGRAM N/A 11/14/2014   Procedure: LEFT HEART CATHETERIZATION WITH CORONARY ANGIOGRAM;  Surgeon: Lennette Bihari, MD;  Location: [facility] CATH LAB;  Service: Cardiovascular;  Laterality: N/A;   PERCUTANEOUS CORONARY STENT INTERVENTION (PCI-S) N/A 11/15/2014   Procedure: PERCUTANEOUS CORONARY STENT INTERVENTION (PCI-S);  Surgeon: Lennette Bihari, MD;  Location: [facility] CATH LAB;  Service: Cardiovascular;  Laterality: N/A;   STOMACH SURGERY     at 72 weeks old   TEE WITHOUT CARDIOVERSION N/A 11/15/2014   Procedure: TRANSESOPHAGEAL ECHOCARDIOGRAM (TEE);  Surgeon: Loreli Slot, MD;  Location:  [facility] OR;  Service: Open Heart Surgery;  Laterality: N/A;   URETEROSCOPY WITH HOLMIUM LASER LITHOTRIPSY Bilateral 01/17/2017   Procedure: URETEROSCOPY WITH HOLMIUM LASER LITHOTRIPSY;  Surgeon: [name], MD;  Location: [facility] ORS;  Service: Urology;  Laterality: Bilateral;   URETEROSCOPY WITH HOLMIUM LASER LITHOTRIPSY Right 02/04/2017   Procedure: URETEROSCOPY WITH HOLMIUM LASER LITHOTRIPSY;  Surgeon: [name], MD;  Location: [facility] ORS;  Service: Urology;  Laterality: Right;     Home Medications:  Prior to Admission medications   Medication Sig Start Date End Date Taking? Authorizing Provider  aspirin EC 81 MG tablet Take 1 tablet (81 mg total) by mouth daily. Patient not taking: Reported on 07/09/2021 01/05/15   Nahser, Deloris Ping, MD  metFORMIN (GLUCOPHAGE-XR) 500 MG 24 hr tablet Take 1 tablet (500 mg total) by mouth 2 (two) times daily. Patient not taking: Reported on 07/09/2021 12/13/19   Nahser, Deloris Ping, MD  rosuvastatin (CRESTOR) 10 MG tablet Take 1 tablet (10 mg total) by mouth daily. Patient not taking: Reported on 07/09/2021 12/10/19   Nahser, Deloris Ping, MD    Inpatient Medications: Scheduled Meds:  aspirin EC  81 mg Oral Daily   enoxaparin (LOVENOX) injection  0.5 mg/kg Subcutaneous Q24H   insulin aspart  0-15 Units Subcutaneous TID WC   rosuvastatin  10 mg Oral Daily   Continuous Infusions:  sodium chloride 100 mL/hr at 07/10/21 0707   PRN Meds: acetaminophen **OR** acetaminophen, magnesium hydroxide, ondansetron **OR** ondansetron (ZOFRAN) IV, traZODone  Allergies:    Allergies  Allergen Reactions   Coconut Oil Hives    Social History:   Social History   Socioeconomic History   Marital status: Married    Spouse name: Not on file   Number of children: Not on file   Years of education: Not on file   Highest education level: Not on file  Occupational History   Not on file  Tobacco Use   Smoking status: Never   Smokeless tobacco: Never  Vaping Use   Vaping  Use: Never used  Substance and Sexual Activity   Alcohol use: Yes    Alcohol/week: 0.0 - 1.0 standard drinks    Comment: 1 glass of wine every 3 months   Drug use: No   Sexual activity: Not on file  Other Topics Concern   Not on file  Social History Narrative   Not on file   Social Determinants of Health   Financial Resource Strain: Not on file  Food Insecurity: Not on file  Transportation Needs: Not on file  Physical Activity: Not on file  Stress: Not on file  Social Connections: Not on file  Intimate Partner Violence: Not on file    Family History:    Family History  Problem Relation Age of Onset   Heart disease Mother    Stroke Mother    Nephrolithiasis Mother    Heart attack Father    Stroke Father    Kidney cancer Neg Hx    Bladder Cancer Neg Hx    Prostate cancer Neg Hx  ROS:  Please see the history of present illness.   All other ROS reviewed and negative.     Physical Exam/Data:   Vitals:   07/10/21 0200 07/10/21 0309 07/10/21 0400 07/10/21 0500  BP: 136/88 131/82 119/87 125/84  Pulse: 70 88 76 82  Resp: 17 (!) 25 13 14   Temp:      TempSrc:      SpO2: 95% 94% 97% 95%  Weight:      Height:        Intake/Output Summary (Last 24 hours) at 07/10/2021 0741 Last data filed at 07/10/2021 0707 Gross per 24 hour  Intake 841.89 ml  Output --  Net 841.89 ml   Last 3 Weights 07/09/2021 12/10/2019 06/02/2019  Weight (lbs) 265 lb 274 lb 4 oz 245 lb  Weight (kg) 120.203 kg 124.399 kg 111.131 kg     Body mass index is 36.96 kg/m.  General:  Well nourished, well developed, in no acute distress HEENT: normal Neck: no JVD Vascular: No carotid bruits; Distal pulses 2+ bilaterally Cardiac:  normal S1, S2; RRR; no murmur  Lungs:  clear to auscultation bilaterally, no wheezing, rhonchi or rales  Abd: soft, nontender, no hepatomegaly  Ext: no edema Musculoskeletal:  No deformities, BUE and BLE strength normal and equal Skin: warm and dry  Neuro:  CNs  2-12 intact, no focal abnormalities noted Psych:  Normal affect   EKG:  The EKG was personally reviewed and demonstrates:  NSR 85bpm, antseptal infarct, nonspecific  T wave changes Telemetry:  Telemetry was personally reviewed and demonstrates:  NSR, HR 90s, PVCs  Relevant CV Studies:  Echo 2017 Study Conclusions   - Left ventricle: The cavity size was normal. There was mild    concentric hypertrophy. Systolic function was mildly to    moderately reduced. The estimated ejection fraction was in the    range of 40% to 45%. Severe hypokinesis of the    mid-apicalanteroseptal and anterior myocardium. Akinesis of the    apical myocardium. Doppler parameters are consistent with    abnormal left ventricular relaxation (grade 1 diastolic    dysfunction).  - Left atrium: The atrium was mildly dilated.   CABG 10/2014      Left internal mammary artery to left anterior descending      Saphenous vein graft to first diagonal      Saphenous vein graft to first obtuse marginal    Laboratory Data:  High Sensitivity Troponin:   Recent Labs  Lab 07/09/21 1450 07/09/21 1723  TROPONINIHS 13 49*     Chemistry Recent Labs  Lab 07/09/21 1450 07/10/21 0551  NA 133* 135  K 4.2 3.9  CL 100 103  CO2 27 26  GLUCOSE 290* 175*  BUN 12 13  CREATININE 1.12 1.11  CALCIUM 9.5 8.8*  GFRNONAA >60 >60  ANIONGAP 6 6    No results for input(s): PROT, ALBUMIN, AST, ALT, ALKPHOS, BILITOT in the last 168 hours. Lipids No results for input(s): CHOL, TRIG, HDL, LABVLDL, LDLCALC, CHOLHDL in the last 168 hours.  Hematology Recent Labs  Lab 07/09/21 1450 07/10/21 0551  WBC 6.9 7.4  RBC 5.14 4.82  HGB 15.7 14.9  HCT 45.6 42.9  MCV 88.7 89.0  MCH 30.5 30.9  MCHC 34.4 34.7  RDW 12.0 12.0  PLT 164 158   Thyroid No results for input(s): TSH, FREET4 in the last 168 hours.  BNPNo results for input(s): BNP, PROBNP in the last 168 hours.  DDimer No results for input(s):  DDIMER in the last 168  hours.   Radiology/Studies:  DG Chest 2 View  Result Date: 07/09/2021 CLINICAL DATA:  Chest pain and jaw pain, sudden onset. EXAM: CHEST - 2 VIEW COMPARISON:  12/13/2014. FINDINGS: Trachea is midline. Heart is at the upper limits of normal in size, stable. Lungs are clear. No pleural fluid. IMPRESSION: No acute findings. Electronically Signed   By: Melinda  Blietz M.D.   On: 07/09/2021 15:21     Assessment and Plan:   NSTEMI CAD s/p CABG and prior stenting - presented with chest pain similar to prior MI. SH trop 49>13. EKG with no acute changes. Started on IV heparin - reports last stenting was in [place] in August 2021 - PTA only takes Aspirin. Not on statin or BB - Patient is chest pain free - Plan for cardiac cath today. Further recs per cath.  Risks and benefits of cardiac catheterization have been discussed with the patient.  These include bleeding, infection, kidney damage, stroke, heart attack, death.  The patient understands these risks and is willing to proceed.    HFrEF ICM - Echo from 2017 showed LVEF 40-45% - PTA not on BB or ACEi/ARB - Not on diuretic at baseline - Appears euvolemic on exam - repeat echo - Add GDMT as able  HTN - not on meds at baseline - BP elevated, start BB  HLD - LDL 153 in 11/2019 - re-check lipid panel - started on Crestor 10mg daily  DM2 - A1C 7.5 - per IM   For questions or updates, please contact [facility] Please consult [email or web address] for contact info under    Signed, [place] [employer] [facility] E[facility] Forrester, PA-C  07/10/2021 7:41 AM  

## 2021-07-10 NOTE — Brief Op Note (Addendum)
BRIEF CARDIAC CATHETERIZATION NOTE  DATE: 07/10/2021  TIME: 10:43 AM  PATIENT:  Seth Riddle  60 y.o. male  PRE-OPERATIVE DIAGNOSIS:  NSTEMI  POST-OPERATIVE DIAGNOSIS:  NSTEMI  PROCEDURE:  Procedure(s): LEFT HEART CATH AND CORONARY ANGIOGRAPHY (N/A)  SURGEON:  Surgeon(s) and Role:    * Sherylann Vangorden, MD - Primary  FINDINGS: Severe two-vessel coronary artery disease with chronic total occlusions of mid LAD and OM2.  Mild-moderate, non-obstructive RCA disease. Widely patent LIMA-LAD. Chronically occluded SVG-D1. Acute/subacute thrombotic occlusion of SVG-OM2 with heavy thrombus burden. Severely reduced left ventricular systolic function with LVEF 25-35%) with moderately elevated filling pressure (LVEDP 30 mmHg).   RECOMMENDATIONS: Medical therapy, including 12 months of dual antiplatelet therapy with aspirin and clopidogrel.  SVG-OM2 is not amendable to PCI. Diurese and initiate goal-directed medical therapy for acute on chronic HFrEF due to ischemic cardiomyopathy. Aggressive secondary prevention of coronary artery disease.  Yvonne Kendall, MD [facility]

## 2021-07-10 NOTE — Consult Note (Signed)
Cardiology Consultation:   Patient ID: Seth Riddle MRN: [id number]; DOB: 16-Feb-1961  Admit date: 07/09/2021 Date of Consult: 07/10/2021  PCP:  Oneita Hurt No   [facility] Providers Cardiologist:  Kristeen Miss, MD   {    Patient Profile:   Seth Riddle is a 60 y.o. male with a hx of CAD s/p CABG and prior stenting, DM, HLD, chronic systolic heart failure/ischemic cardiomyopathy, medication noncompliance who is being seen 07/10/2021 for the evaluation of chest pain at the request of Seth Riddle.  History of Present Illness:   Seth Riddle is followed by Seth Riddle. Hx of CAD initial admission with NSTEMI. Cath showed severe 3V CAD. Developed recurrent chest pain with anterior STEMI in 10/2014. Underwent emergent PCI and IABP followed by emergent CABG (LIMA to LAD, SVG to DIAG1, SVG to OM1). Echo 10/2015 showed LVEF 40-45% (unchanged since CABG), G1DD. Saw Seth Riddle in 05/2016 and reported chest pain, offered stress test, however declined.   Last seen 11/2019 and was doing well. Was only on ASA. Tried to restart statin, Crestor.   Reports he had a stent in [place] in 03/2020.   Presented to the ER for chest pain. Was eating lunch yesterday and started feeling indigestion type pain in the center of his chest. Pain went up to the jaw. Pain lasted an about. 2/10. No associated symptoms. Pain was similar to prior MI. He took [facility] powder, which seemed to improve the pain. Reports he is only on ASA, not on blood pressure or cholesterol medications.  In the ER HS trop 14>49. BP 150/94. Labs showed BG 290, sodium 133. Respiratory panel negative. CXR showed no acute process. Started on IV heparin and admitted.    Past Medical History:  Diagnosis Date   Coronary artery disease    Diabetes mellitus without complication (HCC)    Dyslipidemia (high LDL; low HDL)    Family history of premature CAD    Headache    History of kidney stones    Low HDL (under 40)    MI (myocardial  infarction) (HCC) 2016   PONV (postoperative nausea and vomiting)    as a child eye surgery    Past Surgical History:  Procedure Laterality Date   CORONARY ARTERY BYPASS GRAFT N/A 11/15/2014   Procedure: CORONARY ARTERY BYPASS GRAFTING (CABG), ON PUMP, TIMES THREE, USING LEFT INTERNAL MAMMARY ARTERY, RIGHT GREATER SAPHENOUS VEIN HARVESTED ENDOSCOPICALLY;  Surgeon: Loreli Slot, MD;  Location: [facility] OR;  Service: Open Heart Surgery;  Laterality: N/A;   CYSTOSCOPY W/ URETERAL STENT PLACEMENT Right 02/04/2017   Procedure: CYSTOSCOPY WITH STENT REPLACEMENT;  Surgeon: [name], MD;  Location: [facility] ORS;  Service: Urology;  Laterality: Right;   CYSTOSCOPY W/ URETERAL STENT REMOVAL Left 02/04/2017   Procedure: CYSTOSCOPY WITH STENT REMOVAL;  Surgeon: [name], MD;  Location: [facility] ORS;  Service: Urology;  Laterality: Left;   CYSTOSCOPY WITH STENT PLACEMENT Bilateral 01/17/2017   Procedure: CYSTOSCOPY WITH STENT PLACEMENT;  Surgeon: [name], MD;  Location: [facility] ORS;  Service: Urology;  Laterality: Bilateral;   EYE SURGERY     multiple eye surgeries as a child   INTRA-AORTIC BALLOON PUMP INSERTION N/A 11/15/2014   Procedure: INTRA-AORTIC BALLOON PUMP INSERTION;  Surgeon: Lennette Bihari, MD;  Location: [facility] CATH LAB;  Service: Cardiovascular;  Laterality: N/A;   LEFT HEART CATHETERIZATION WITH CORONARY ANGIOGRAM N/A 11/14/2014   Procedure: LEFT HEART CATHETERIZATION WITH CORONARY ANGIOGRAM;  Surgeon: Lennette Bihari, MD;  Location: [facility] CATH LAB;  Service: Cardiovascular;  Laterality: N/A;   PERCUTANEOUS CORONARY STENT INTERVENTION (PCI-S) N/A 11/15/2014   Procedure: PERCUTANEOUS CORONARY STENT INTERVENTION (PCI-S);  Surgeon: Lennette Bihari, MD;  Location: [facility] CATH LAB;  Service: Cardiovascular;  Laterality: N/A;   STOMACH SURGERY     at 72 weeks old   TEE WITHOUT CARDIOVERSION N/A 11/15/2014   Procedure: TRANSESOPHAGEAL ECHOCARDIOGRAM (TEE);  Surgeon: Loreli Slot, MD;  Location:  [facility] OR;  Service: Open Heart Surgery;  Laterality: N/A;   URETEROSCOPY WITH HOLMIUM LASER LITHOTRIPSY Bilateral 01/17/2017   Procedure: URETEROSCOPY WITH HOLMIUM LASER LITHOTRIPSY;  Surgeon: [name], MD;  Location: [facility] ORS;  Service: Urology;  Laterality: Bilateral;   URETEROSCOPY WITH HOLMIUM LASER LITHOTRIPSY Right 02/04/2017   Procedure: URETEROSCOPY WITH HOLMIUM LASER LITHOTRIPSY;  Surgeon: [name], MD;  Location: [facility] ORS;  Service: Urology;  Laterality: Right;     Home Medications:  Prior to Admission medications   Medication Sig Start Date End Date Taking? Authorizing Provider  aspirin EC 81 MG tablet Take 1 tablet (81 mg total) by mouth daily. Patient not taking: Reported on 07/09/2021 01/05/15   Nahser, Deloris Ping, MD  metFORMIN (GLUCOPHAGE-XR) 500 MG 24 hr tablet Take 1 tablet (500 mg total) by mouth 2 (two) times daily. Patient not taking: Reported on 07/09/2021 12/13/19   Nahser, Deloris Ping, MD  rosuvastatin (CRESTOR) 10 MG tablet Take 1 tablet (10 mg total) by mouth daily. Patient not taking: Reported on 07/09/2021 12/10/19   Nahser, Deloris Ping, MD    Inpatient Medications: Scheduled Meds:  aspirin EC  81 mg Oral Daily   enoxaparin (LOVENOX) injection  0.5 mg/kg Subcutaneous Q24H   insulin aspart  0-15 Units Subcutaneous TID WC   rosuvastatin  10 mg Oral Daily   Continuous Infusions:  sodium chloride 100 mL/hr at 07/10/21 0707   PRN Meds: acetaminophen **OR** acetaminophen, magnesium hydroxide, ondansetron **OR** ondansetron (ZOFRAN) IV, traZODone  Allergies:    Allergies  Allergen Reactions   Coconut Oil Hives    Social History:   Social History   Socioeconomic History   Marital status: Married    Spouse name: Not on file   Number of children: Not on file   Years of education: Not on file   Highest education level: Not on file  Occupational History   Not on file  Tobacco Use   Smoking status: Never   Smokeless tobacco: Never  Vaping Use   Vaping  Use: Never used  Substance and Sexual Activity   Alcohol use: Yes    Alcohol/week: 0.0 - 1.0 standard drinks    Comment: 1 glass of wine every 3 months   Drug use: No   Sexual activity: Not on file  Other Topics Concern   Not on file  Social History Narrative   Not on file   Social Determinants of Health   Financial Resource Strain: Not on file  Food Insecurity: Not on file  Transportation Needs: Not on file  Physical Activity: Not on file  Stress: Not on file  Social Connections: Not on file  Intimate Partner Violence: Not on file    Family History:    Family History  Problem Relation Age of Onset   Heart disease Mother    Stroke Mother    Nephrolithiasis Mother    Heart attack Father    Stroke Father    Kidney cancer Neg Hx    Bladder Cancer Neg Hx    Prostate cancer Neg Hx  ROS:  Please see the history of present illness.   All other ROS reviewed and negative.     Physical Exam/Data:   Vitals:   07/10/21 0200 07/10/21 0309 07/10/21 0400 07/10/21 0500  BP: 136/88 131/82 119/87 125/84  Pulse: 70 88 76 82  Resp: 17 (!) 25 13 14   Temp:      TempSrc:      SpO2: 95% 94% 97% 95%  Weight:      Height:        Intake/Output Summary (Last 24 hours) at 07/10/2021 0741 Last data filed at 07/10/2021 0707 Gross per 24 hour  Intake 841.89 ml  Output --  Net 841.89 ml   Last 3 Weights 07/09/2021 12/10/2019 06/02/2019  Weight (lbs) 265 lb 274 lb 4 oz 245 lb  Weight (kg) 120.203 kg 124.399 kg 111.131 kg     Body mass index is 36.96 kg/m.  General:  Well nourished, well developed, in no acute distress HEENT: normal Neck: no JVD Vascular: No carotid bruits; Distal pulses 2+ bilaterally Cardiac:  normal S1, S2; RRR; no murmur  Lungs:  clear to auscultation bilaterally, no wheezing, rhonchi or rales  Abd: soft, nontender, no hepatomegaly  Ext: no edema Musculoskeletal:  No deformities, BUE and BLE strength normal and equal Skin: warm and dry  Neuro:  CNs  2-12 intact, no focal abnormalities noted Psych:  Normal affect   EKG:  The EKG was personally reviewed and demonstrates:  NSR 85bpm, antseptal infarct, nonspecific  T wave changes Telemetry:  Telemetry was personally reviewed and demonstrates:  NSR, HR 90s, PVCs  Relevant CV Studies:  Echo 2017 Study Conclusions   - Left ventricle: The cavity size was normal. There was mild    concentric hypertrophy. Systolic function was mildly to    moderately reduced. The estimated ejection fraction was in the    range of 40% to 45%. Severe hypokinesis of the    mid-apicalanteroseptal and anterior myocardium. Akinesis of the    apical myocardium. Doppler parameters are consistent with    abnormal left ventricular relaxation (grade 1 diastolic    dysfunction).  - Left atrium: The atrium was mildly dilated.   CABG 10/2014      Left internal mammary artery to left anterior descending      Saphenous vein graft to first diagonal      Saphenous vein graft to first obtuse marginal    Laboratory Data:  High Sensitivity Troponin:   Recent Labs  Lab 07/09/21 1450 07/09/21 1723  TROPONINIHS 13 49*     Chemistry Recent Labs  Lab 07/09/21 1450 07/10/21 0551  NA 133* 135  K 4.2 3.9  CL 100 103  CO2 27 26  GLUCOSE 290* 175*  BUN 12 13  CREATININE 1.12 1.11  CALCIUM 9.5 8.8*  GFRNONAA >60 >60  ANIONGAP 6 6    No results for input(s): PROT, ALBUMIN, AST, ALT, ALKPHOS, BILITOT in the last 168 hours. Lipids No results for input(s): CHOL, TRIG, HDL, LABVLDL, LDLCALC, CHOLHDL in the last 168 hours.  Hematology Recent Labs  Lab 07/09/21 1450 07/10/21 0551  WBC 6.9 7.4  RBC 5.14 4.82  HGB 15.7 14.9  HCT 45.6 42.9  MCV 88.7 89.0  MCH 30.5 30.9  MCHC 34.4 34.7  RDW 12.0 12.0  PLT 164 158   Thyroid No results for input(s): TSH, FREET4 in the last 168 hours.  BNPNo results for input(s): BNP, PROBNP in the last 168 hours.  DDimer No results for input(s):  DDIMER in the last 168  hours.   Radiology/Studies:  DG Chest 2 View  Result Date: 07/09/2021 CLINICAL DATA:  Chest pain and jaw pain, sudden onset. EXAM: CHEST - 2 VIEW COMPARISON:  12/13/2014. FINDINGS: Trachea is midline. Heart is at the upper limits of normal in size, stable. Lungs are clear. No pleural fluid. IMPRESSION: No acute findings. Electronically Signed   By: Leanna Battles M.D.   On: 07/09/2021 15:21     Assessment and Plan:   NSTEMI CAD s/p CABG and prior stenting - presented with chest pain similar to prior MI. [facility] trop 49>13. EKG with no acute changes. Started on IV heparin - reports last stenting was in [place] in August 2021 - PTA only takes Aspirin. Not on statin or BB - Patient is chest pain free - Plan for cardiac cath today. Further recs per cath.  Risks and benefits of cardiac catheterization have been discussed with the patient.  These include bleeding, infection, kidney damage, stroke, heart attack, death.  The patient understands these risks and is willing to proceed.    HFrEF ICM - Echo from 2017 showed LVEF 40-45% - PTA not on BB or ACEi/ARB - Not on diuretic at baseline - Appears euvolemic on exam - repeat echo - Add GDMT as able  HTN - not on meds at baseline - BP elevated, start BB  HLD - LDL 153 in 11/2019 - re-check lipid panel - started on Crestor 10mg  daily  DM2 - A1C 7.5 - per IM   For questions or updates, please contact [facility] Please consult [email or web address] for contact info under    Signed, Avriel Kandel David Stall, PA-C  07/10/2021 7:41 AM

## 2021-07-11 ENCOUNTER — Observation Stay: Admit: 2021-07-11 | Payer: BC Managed Care – PPO

## 2021-07-11 ENCOUNTER — Encounter: Payer: Self-pay | Admitting: Internal Medicine

## 2021-07-11 ENCOUNTER — Observation Stay (HOSPITAL_BASED_OUTPATIENT_CLINIC_OR_DEPARTMENT_OTHER)
Admit: 2021-07-11 | Discharge: 2021-07-11 | Disposition: A | Discharge status: Home / Self Care | Payer: BC Managed Care – PPO | Attending: Internal Medicine | Admitting: Internal Medicine

## 2021-07-11 DIAGNOSIS — R079 Chest pain, unspecified: Secondary | ICD-10-CM | POA: Diagnosis not present

## 2021-07-11 DIAGNOSIS — I214 Non-ST elevation (NSTEMI) myocardial infarction: Secondary | ICD-10-CM | POA: Diagnosis not present

## 2021-07-11 DIAGNOSIS — I248 Other forms of acute ischemic heart disease: Secondary | ICD-10-CM | POA: Diagnosis not present

## 2021-07-11 LAB — ECHOCARDIOGRAM COMPLETE
AR max vel: 2.33 cm2
AV Area VTI: 2.61 cm2
AV Area mean vel: 2.36 cm2
AV Mean grad: 4 mmHg
AV Peak grad: 6.9 mmHg
Ao pk vel: 1.31 m/s
Area-P 1/2: 3.6 cm2
Calc EF: 50.1 %
Height: 71 in
MV VTI: 2.87 cm2
S' Lateral: 3.63 cm
Single Plane A2C EF: 51.3 %
Single Plane A4C EF: 51.3 %
Weight: 4400 oz

## 2021-07-11 LAB — MAGNESIUM: Magnesium: 2.2 mg/dL (ref 1.7–2.4)

## 2021-07-11 LAB — GLUCOSE, CAPILLARY
Glucose-Capillary: 165 mg/dL — ABNORMAL HIGH (ref 70–99)
Glucose-Capillary: 245 mg/dL — ABNORMAL HIGH (ref 70–99)

## 2021-07-11 LAB — LIPID PANEL
Cholesterol: 204 mg/dL — ABNORMAL HIGH (ref 0–200)
HDL: 28 mg/dL — ABNORMAL LOW (ref >40)
LDL Cholesterol: 135 mg/dL — ABNORMAL HIGH (ref 0–99)
Total CHOL/HDL Ratio: 7.3 RATIO
Triglycerides: 205 mg/dL — ABNORMAL HIGH (ref <150)
VLDL: 41 mg/dL — ABNORMAL HIGH (ref 0–40)

## 2021-07-11 LAB — BASIC METABOLIC PANEL
Anion gap: 8 (ref 5–15)
BUN: 18 mg/dL (ref 6–20)
CO2: 27 mmol/L (ref 22–32)
Calcium: 9.2 mg/dL (ref 8.9–10.3)
Chloride: 100 mmol/L (ref 98–111)
Creatinine, Ser: 1.19 mg/dL (ref 0.61–1.24)
GFR, Estimated: >60 mL/min (ref >60)
Glucose, Bld: 161 mg/dL — ABNORMAL HIGH (ref 70–99)
Potassium: 3.7 mmol/L (ref 3.5–5.1)
Sodium: 135 mmol/L (ref 135–145)

## 2021-07-11 LAB — CBC WITH DIFFERENTIAL/PLATELET
Abs Immature Granulocytes: 0.02 10*3/uL (ref 0.00–0.07)
Basophils Absolute: 0 10*3/uL (ref 0.0–0.1)
Basophils Relative: 1 %
Eosinophils Absolute: 0.2 10*3/uL (ref 0.0–0.5)
Eosinophils Relative: 3 %
HCT: 43.7 % (ref 39.0–52.0)
Hemoglobin: 15.1 g/dL (ref 13.0–17.0)
Immature Granulocytes: 0 %
Lymphocytes Relative: 23 %
Lymphs Abs: 2 10*3/uL (ref 0.7–4.0)
MCH: 30.7 pg (ref 26.0–34.0)
MCHC: 34.6 g/dL (ref 30.0–36.0)
MCV: 88.8 fL (ref 80.0–100.0)
Monocytes Absolute: 0.7 10*3/uL (ref 0.1–1.0)
Monocytes Relative: 8 %
Neutro Abs: 5.6 10*3/uL (ref 1.7–7.7)
Neutrophils Relative %: 65 %
Platelets: 161 10*3/uL (ref 150–400)
RBC: 4.92 MIL/uL (ref 4.22–5.81)
RDW: 12 % (ref 11.5–15.5)
WBC: 8.5 10*3/uL (ref 4.0–10.5)
nRBC: 0 % (ref 0.0–0.2)

## 2021-07-11 MED ORDER — TRAZODONE HCL 50 MG PO TABS
25.0000 mg | ORAL_TABLET | Freq: Every evening | ORAL | 0 refills | Status: DC | PRN
Start: 2021-07-11 — End: 2022-12-02

## 2021-07-11 MED ORDER — FUROSEMIDE 40 MG PO TABS: 40.0000 mg | ORAL_TABLET | Freq: Every day | ORAL | 0 refills | Status: DC

## 2021-07-11 MED ORDER — FUROSEMIDE 10 MG/ML IJ SOLN
40.0000 mg | Freq: Every day | INTRAMUSCULAR | Status: DC
  Administered 2021-07-11: 40 mg via INTRAVENOUS
  Filled 2021-07-11: qty 4

## 2021-07-11 MED ORDER — ASPIRIN EC 81 MG PO TBEC: 81.0000 mg | DELAYED_RELEASE_TABLET | Freq: Every day | ORAL | 3 refills | Status: AC

## 2021-07-11 MED ORDER — CLOPIDOGREL BISULFATE 75 MG PO TABS
75.0000 mg | ORAL_TABLET | Freq: Every day | ORAL | Status: DC
  Administered 2021-07-11: 75 mg via ORAL
  Filled 2021-07-11: qty 1

## 2021-07-11 MED ORDER — PERFLUTREN LIPID MICROSPHERE
1.0000 mL | INTRAVENOUS | Status: AC | PRN
  Administered 2021-07-11: 3 mL via INTRAVENOUS
  Filled 2021-07-11: qty 10

## 2021-07-11 MED ORDER — FUROSEMIDE 40 MG PO TABS: 40.0000 mg | ORAL_TABLET | Freq: Every day | ORAL | Status: DC

## 2021-07-11 MED ORDER — POTASSIUM CHLORIDE CRYS ER 20 MEQ PO TBCR: 20.0000 meq | EXTENDED_RELEASE_TABLET | Freq: Every day | ORAL | 0 refills | Status: DC

## 2021-07-11 MED ORDER — ROSUVASTATIN CALCIUM 10 MG PO TABS: 40.0000 mg | ORAL_TABLET | Freq: Every day | ORAL | Status: DC

## 2021-07-11 MED ORDER — CLOPIDOGREL BISULFATE 75 MG PO TABS: 75.0000 mg | ORAL_TABLET | Freq: Every day | ORAL | 0 refills | Status: DC

## 2021-07-11 MED ORDER — PANTOPRAZOLE SODIUM 40 MG PO TBEC: 40.0000 mg | DELAYED_RELEASE_TABLET | Freq: Every day | ORAL | 0 refills | Status: DC

## 2021-07-11 MED ORDER — DAPAGLIFLOZIN PROPANEDIOL 5 MG PO TABS: 5.0000 mg | ORAL_TABLET | Freq: Every day | ORAL | 0 refills | Status: DC

## 2021-07-11 MED ORDER — ROSUVASTATIN CALCIUM 40 MG PO TABS: 40.0000 mg | ORAL_TABLET | Freq: Every day | ORAL | 0 refills | Status: DC

## 2021-07-11 NOTE — Progress Notes (Signed)
Progress Note  Patient Name: Seth Riddle Date of Encounter: 07/11/2021  [facility] Cardiologist: Kristeen Miss, MD   Subjective   Lasix and BB held for hypotension. Patient overall is doing well. Cath site, left radial and right groin stable. No further chest pain or SOB.   Inpatient Medications    Scheduled Meds:  aspirin EC  81 mg Oral Daily   enoxaparin (LOVENOX) injection  0.5 mg/kg Subcutaneous Q24H   insulin aspart  0-15 Units Subcutaneous TID WC   pantoprazole  40 mg Oral Daily   rosuvastatin  10 mg Oral Daily   sodium chloride flush  3 mL Intravenous Q12H   sodium chloride flush  3 mL Intravenous Q12H   Continuous Infusions:  sodium chloride     PRN Meds: sodium chloride, acetaminophen **OR** acetaminophen, magnesium hydroxide, ondansetron **OR** ondansetron (ZOFRAN) IV, sodium chloride flush, traZODone   Vital Signs    Vitals:   07/11/21 0027 07/11/21 0400 07/11/21 0405 07/11/21 0819  BP: (!) 81/54  94/64 (!) 99/57  Pulse: 81  78 75  Resp: 18  17 17   Temp: 98 F (36.7 C)  98.3 F (36.8 C) 97.8 F (36.6 C)  TempSrc:   Oral Oral  SpO2: 93%  94% 94%  Weight:  124.7 kg    Height:        Intake/Output Summary (Last 24 hours) at 07/11/2021 1042 Last data filed at 07/11/2021 0418 Gross per 24 hour  Intake 360 ml  Output 2100 ml  Net -1740 ml   Last 3 Weights 07/11/2021 07/10/2021 07/09/2021  Weight (lbs) 275 lb 274 lb 6.4 oz 265 lb  Weight (kg) 124.739 kg 124.467 kg 120.203 kg      Telemetry    NSR, HR 70s - Personally Reviewed  ECG    No new - Personally Reviewed  Physical Exam   GEN: No acute distress.   Neck: No JVD Cardiac: RRR, no murmurs, rubs, or gallops.  Respiratory: Clear to auscultation bilaterally. GI: Soft, nontender, non-distended  MS: No edema; No deformity. Neuro:  Nonfocal  Psych: Normal affect   Labs    High Sensitivity Troponin:   Recent Labs  Lab 07/09/21 1450 07/09/21 1723  TROPONINIHS 13 49*      Chemistry Recent Labs  Lab 07/09/21 1450 07/10/21 0551 07/11/21 0343  NA 133* 135 135  K 4.2 3.9 3.7  CL 100 103 100  CO2 27 26 27   GLUCOSE 290* 175* 161*  BUN 12 13 18   CREATININE 1.12 1.11 1.19  CALCIUM 9.5 8.8* 9.2  MG  --   --  2.2  GFRNONAA >60 >60 >60  ANIONGAP 6 6 8     Lipids  Recent Labs  Lab 07/11/21 0343  CHOL 204*  TRIG 205*  HDL 28*  LDLCALC 135*  CHOLHDL 7.3    Hematology Recent Labs  Lab 07/09/21 1450 07/10/21 0551 07/11/21 0343  WBC 6.9 7.4 8.5  RBC 5.14 4.82 4.92  HGB 15.7 14.9 15.1  HCT 45.6 42.9 43.7  MCV 88.7 89.0 88.8  MCH 30.5 30.9 30.7  MCHC 34.4 34.7 34.6  RDW 12.0 12.0 12.0  PLT 164 158 161   Thyroid No results for input(s): TSH, FREET4 in the last 168 hours.  BNPNo results for input(s): BNP, PROBNP in the last 168 hours.  DDimer No results for input(s): DDIMER in the last 168 hours.   Radiology    DG Chest 2 View  Result Date: 07/09/2021 CLINICAL DATA:  Chest pain  and jaw pain, sudden onset. EXAM: CHEST - 2 VIEW COMPARISON:  12/13/2014. FINDINGS: Trachea is midline. Heart is at the upper limits of normal in size, stable. Lungs are clear. No pleural fluid. IMPRESSION: No acute findings. Electronically Signed   By: Leanna Battles M.D.   On: 07/09/2021 15:21   CARDIAC CATHETERIZATION  Result Date: 07/10/2021 Conclusions: Severe two-vessel coronary artery disease with chronic total occlusions of mid LAD and OM2.  Mild-moderate, non-obstructive RCA disease. Widely patent LIMA-LAD. Chronically occluded SVG-D1. Acute/subacute thrombotic occlusion of SVG-OM2 with heavy thrombus burden. Severely reduced left ventricular systolic function with LVEF 25-35%) with moderately elevated filling pressure (LVEDP 30 mmHg).  Recommendations: Medical therapy, including 12 months of dual antiplatelet therapy with aspirin and clopidogrel.  SVG-OM2 is not amendable to PCI. Diurese and initiate goal-directed medical therapy for acute on chronic HFrEF due  to ischemic cardiomyopathy. Aggressive secondary prevention of coronary artery disease. Yvonne Kendall, MD chief [facility]   Cardiac Studies   Cardiac cath 07/10/21 Conclusions: Severe two-vessel coronary artery disease with chronic total occlusions of mid LAD and OM2.  Mild-moderate, non-obstructive RCA disease. Widely patent LIMA-LAD. Chronically occluded SVG-D1. Acute/subacute thrombotic occlusion of SVG-OM2 with heavy thrombus burden. Severely reduced left ventricular systolic function with LVEF 25-35%) with moderately elevated filling pressure (LVEDP 30 mmHg).   Recommendations: Medical therapy, including 12 months of dual antiplatelet therapy with aspirin and clopidogrel.  SVG-OM2 is not amendable to PCI. Diurese and initiate goal-directed medical therapy for acute on chronic HFrEF due to ischemic cardiomyopathy. Aggressive secondary prevention of coronary artery disease.   Yvonne Kendall, MD chief [facility]  Echo ordered  Patient Profile     60 y.o. male with a hx of CAD s/p CABG and prior stenting, DM, HLD, chronic systolic heart failure/ischemic cardiomyopathy, medication noncompliance who is being seen 07/10/2021 for the evaluation of chest pain.  Assessment & Plan    NSTEMI CAD s/p CABG and prior stenting - presented with chest pain similar to prior MI. HS trop 49>13. EKG with no acute changes.  - reports last stenting was in [place] in August 2021 - PTA only takes Aspirin.  - Not on statin or BB PTA - he was started on IV heparin and set up for cath - Cath showed severe 2V CAD with CTO mLAD and OM2, mild-mod nonobstructive RCA disease, widely patent LIMA - LAD, chronically occluded SVG-D1, scute/subacute thrombotic occlusion of SVG-OM2 with heavy thrombus burden, LVEF 25-35% with moderately elevated filling pressures LVEDP .  - Plan for medical therapy including 12 months of DAPT with ASA and Plavix, SVG-OM2 not amenable to PCI.  - Patient denies  recurrent chest pain - continue statin, resume BB when able - cath sites stable - will need cardiac rehab as OP   HFrEF ICM - Echo from 2017 showed LVEF 40-45% - PTA not on BB or ACEi/ARB - Not on diuretic at baseline - Cath showed reduced EF 25-30% and elevated LVEDP . -  Echo pending - started on IV lasix and BB, but both were held for low BP  - I will restart IV lasix 40mg  daily - Add GDMT as able   HTN - not on meds at baseline - Coreg held as above for hypotension>>restart when able   HLD - LDL 135 - started on Crestor 10mg  daily>>will increase   DM2 - A1C 7.5 - per IM  For questions or updates, please contact [facility] Please consult [email or web address] for contact info under  Signed, Orrie Schubert David Stall, PA-C  07/11/2021, 10:42 AM

## 2021-07-11 NOTE — TOC CM/SW Note (Signed)
Patient has order to discharge home today. Chart reviewed. On room air. No wounds. No TOC needs identified. CSW signing off.  Charlynn Court, CSW [phone]

## 2021-07-11 NOTE — Progress Notes (Signed)
*  PRELIMINARY RESULTS* Echocardiogram 2D Echocardiogram has been performed.  Seth Riddle 07/11/2021, 9:30 AM

## 2021-07-11 NOTE — Progress Notes (Signed)
Discharge instructions given to and reviewed with patient. Patient verbalized understanding of all discharge instructions including follow up appointment, activity level, new medications and changes to home medication list. Patient left unit by wheelchair with this RN. Discharged home with his father driving, alert with no distress noted.

## 2021-07-15 NOTE — Discharge Summary (Signed)
Physician Discharge Summary   Patient name: Seth Riddle  Admit date:     07/09/2021  Discharge date: 07/11/2021   Discharge Physician: Lynden Oxford   PCP: Pcp, No   Recommendations at discharge: follow up wirh Cardiology as recommended   Discharge Diagnoses Principal Problem:   Chest pain Active Problems:   Non-ST elevation (NSTEMI) myocardial infarction [facility])    Hospital Course   Past medical history of CAD SP CABG, HTN, type II DM, HLD.  Presents with complaints of ongoing chest pain.  No nausea no vomiting.  Cardiology was consulted.  Underwent left heart cardiac catheterization.  Medical management recommended.  Chest pain with elevated troponin. Non-STEMI. CAD SP CABG. Acute on chronic HFrEF.  Ischemic cardiomyopathy. HLD. Presented with complains of chest pain.  Troponins were elevated. Neurology was consulted. Patient was taken to cardiac catheterization, acute/subacute thrombus seen in SVG to OM1 graft not amenable for intervention Plan is to continue aspirin and Plavix.  Continue Crestor. Continue diuresis.  Likely EF 25 to 30% Management per cardiology.   Type 2 diabetes mellitus, uncontrolled with hyperglycemia. On metformin.  was hold secondary to cath.   GERD. Occasionally takes antacids. Currently will add PPI.     Procedures performed: Cardiac Catheterization    Condition at discharge: good  Exam General: Appear in mild distress, no Rash; Oral Mucosa Clear, moist. no Abnormal Neck Mass Or lumps, Conjunctiva normal  Cardiovascular: S1 and S2 Present, no Murmur, Respiratory: good respiratory effort, Bilateral Air entry present and CTA, no Crackles, no wheezes Abdomen: Bowel Sound present, Soft and no tenderness Extremities: no Pedal edema Neurology: alert and oriented to time, place, and person affect appropriate. no new focal deficit Gait not checked due to patient safety concerns     Disposition: Home  Discharge time: greater than 30  minutes.  Follow-up Information     Nahser, Deloris Ping, MD. Schedule an appointment as soon as possible for a visit on 07/26/2021.   Specialty: Cardiology Why: @ 9am Patient will see Dr. Laurell Roof information: [address] [place] [address] [phone]                 Allergies as of 07/11/2021       Reactions   Coconut Oil Hives        Medication List     STOP taking these medications    metFORMIN 500 MG 24 hr tablet Commonly known as: GLUCOPHAGE-XR       TAKE these medications    aspirin EC 81 MG tablet Take 1 tablet (81 mg total) by mouth daily.   clopidogrel 75 MG tablet Commonly known as: PLAVIX Take 1 tablet (75 mg total) by mouth daily.   dapagliflozin propanediol 5 MG Tabs tablet Commonly known as: Farxiga Take 1 tablet (5 mg total) by mouth daily before breakfast.   furosemide 40 MG tablet Commonly known as: LASIX Take 1 tablet (40 mg total) by mouth daily.   pantoprazole 40 MG tablet Commonly known as: PROTONIX Take 1 tablet (40 mg total) by mouth daily.   potassium chloride SA 20 MEQ tablet Commonly known as: KLOR-CON Take 1 tablet (20 mEq total) by mouth daily.   rosuvastatin 40 MG tablet Commonly known as: CRESTOR Take 1 tablet (40 mg total) by mouth daily. What changed:  medication strength how much to take   traZODone 50 MG tablet Commonly known as: DESYREL Take 0.5 tablets (25 mg total) by mouth at bedtime as needed  for sleep.        DG Chest 2 View  Result Date: 07/09/2021 CLINICAL DATA:  Chest pain and jaw pain, sudden onset. EXAM: CHEST - 2 VIEW COMPARISON:  12/13/2014. FINDINGS: Trachea is midline. Heart is at the upper limits of normal in size, stable. Lungs are clear. No pleural fluid. IMPRESSION: No acute findings. Electronically Signed   By: Leanna Battles M.D.   On: 07/09/2021 15:21   CARDIAC CATHETERIZATION  Result Date: 07/10/2021 Conclusions: Severe two-vessel coronary artery  disease with chronic total occlusions of mid LAD and OM2.  Mild-moderate, non-obstructive RCA disease. Widely patent LIMA-LAD. Chronically occluded SVG-D1. Acute/subacute thrombotic occlusion of SVG-OM2 with heavy thrombus burden. Severely reduced left ventricular systolic function with LVEF 25-35%) with moderately elevated filling pressure (LVEDP 30 mmHg).  Recommendations: Medical therapy, including 12 months of dual antiplatelet therapy with aspirin and clopidogrel.  SVG-OM2 is not amendable to PCI. Diurese and initiate goal-directed medical therapy for acute on chronic HFrEF due to ischemic cardiomyopathy. Aggressive secondary prevention of coronary artery disease. Yvonne Kendall, MD chief [facility]  ECHOCARDIOGRAM COMPLETE  Result Date: 07/11/2021    ECHOCARDIOGRAM REPORT   Patient Name:   Seth Riddle Date of Exam: 07/11/2021 Medical Rec #:  [id number]          Height:       71.0 in Accession #:    [id number]         Weight:       275.0 lb Date of Birth:  [birth year]           BSA:          2.414 m Patient Age:    60 years           BP:           94/64 mmHg Patient Gender: M                  HR:           88 bpm. Exam Location:  [facility] Procedure: 2D Echo, Color Doppler, Cardiac Doppler and Intracardiac            Opacification Agent Indications:     I24.9 Acute ischemic heart disease  History:         Patient has prior history of Echocardiogram examinations.                  Previous Myocardial Infarction and CAD, Prior CABG; Risk                  Factors:Diabetes and Dyslipidemia.  Sonographer:     Humphrey Rolls Referring Phys:  [phone] CHRISTOPHER END Diagnosing Phys: Lorine Bears MD  Sonographer Comments: Suboptimal parasternal window and no subcostal window. IMPRESSIONS  1. Left ventricular ejection fraction, by estimation, is 30 to 35%. The left ventricle has moderately decreased function. The left ventricle demonstrates regional wall motion abnormalities (see scoring diagram/findings for  description). The left ventricular internal cavity size was mildly dilated. Left ventricular diastolic parameters are consistent with Grade I diastolic dysfunction (impaired relaxation). There is dyskinesis of the left ventricular, apical.  2. Right ventricular systolic function is normal. The right ventricular size is normal. Tricuspid regurgitation signal is inadequate for assessing PA pressure.  3. Left atrial size was mildly dilated.  4. The mitral valve is normal in structure. No evidence of mitral valve regurgitation. No evidence of mitral stenosis.  5. The aortic valve is normal in structure. Aortic valve regurgitation  is not visualized. No aortic stenosis is present.  6. The inferior vena cava is dilated in size with <50% respiratory variability, suggesting right atrial pressure of 15 mmHg. FINDINGS  Left Ventricle: Left ventricular ejection fraction, by estimation, is 30 to 35%. The left ventricle has moderately decreased function. The left ventricle demonstrates regional wall motion abnormalities. Definity contrast agent was given IV to delineate the left ventricular endocardial borders. The left ventricular internal cavity size was mildly dilated. There is no left ventricular hypertrophy. Left ventricular diastolic parameters are consistent with Grade I diastolic dysfunction (impaired relaxation). Right Ventricle: The right ventricular size is normal. No increase in right ventricular wall thickness. Right ventricular systolic function is normal. Tricuspid regurgitation signal is inadequate for assessing PA pressure. Left Atrium: Left atrial size was mildly dilated. Right Atrium: Right atrial size was normal in size. Pericardium: There is no evidence of pericardial effusion. Mitral Valve: The mitral valve is normal in structure. No evidence of mitral valve regurgitation. No evidence of mitral valve stenosis. MV peak gradient, 2.8 mmHg. The mean mitral valve gradient is 1.0 mmHg. Tricuspid Valve: The  tricuspid valve is normal in structure. Tricuspid valve regurgitation is not demonstrated. No evidence of tricuspid stenosis. Aortic Valve: The aortic valve is normal in structure. Aortic valve regurgitation is not visualized. No aortic stenosis is present. Aortic valve mean gradient measures 4.0 mmHg. Aortic valve peak gradient measures 6.9 mmHg. Aortic valve area, by VTI measures 2.61 cm. Pulmonic Valve: The pulmonic valve was normal in structure. Pulmonic valve regurgitation is not visualized. No evidence of pulmonic stenosis. Aorta: The aortic root is normal in size and structure. Venous: The inferior vena cava is dilated in size with less than 50% respiratory variability, suggesting right atrial pressure of 15 mmHg. IAS/Shunts: No atrial level shunt detected by color flow Doppler.  LEFT VENTRICLE PLAX 2D LVIDd:         5.23 cm      Diastology LVIDs:         3.63 cm      LV e' medial:    6.31 cm/s LV PW:         0.98 cm      LV E/e' medial:  8.6 LV IVS:        0.97 cm      LV e' lateral:   7.18 cm/s LVOT diam:     2.10 cm      LV E/e' lateral: 7.5 LV SV:         64 LV SV Index:   26 LVOT Area:     3.46 cm  LV Volumes (MOD) LV vol d, MOD A2C: 106.0 ml LV vol d, MOD A4C: 166.0 ml LV vol s, MOD A2C: 51.6 ml LV vol s, MOD A4C: 80.9 ml LV SV MOD A2C:     54.4 ml LV SV MOD A4C:     166.0 ml LV SV MOD BP:      66.7 ml RIGHT VENTRICLE RV Basal diam:  2.90 cm RV S prime:     11.00 cm/s LEFT ATRIUM             Index        RIGHT ATRIUM           Index LA diam:        4.80 cm 1.99 cm/m   RA Area:     11.90 cm LA Vol (A2C):   64.7 ml 26.80 ml/m  RA Volume:   24.20 ml  10.02 ml/m LA Vol (A4C):   54.1 ml 22.41 ml/m LA Biplane Vol: 59.6 ml 24.69 ml/m  AORTIC VALVE                    PULMONIC VALVE AV Area (Vmax):    2.33 cm     PV Vmax:       1.24 m/s AV Area (Vmean):   2.36 cm     PV Vmean:      84.500 cm/s AV Area (VTI):     2.61 cm     PV VTI:        0.227 m AV Vmax:           131.00 cm/s  PV Peak grad:  6.2 mmHg  AV Vmean:          88.400 cm/s  PV Mean grad:  3.0 mmHg AV VTI:            0.244 m AV Peak Grad:      6.9 mmHg AV Mean Grad:      4.0 mmHg LVOT Vmax:         88.10 cm/s LVOT Vmean:        60.200 cm/s LVOT VTI:          0.184 m LVOT/AV VTI ratio: 0.75  AORTA Ao Root diam: 3.50 cm MITRAL VALVE MV Area (PHT): 3.60 cm    SHUNTS MV Area VTI:   2.87 cm    Systemic VTI:  0.18 m MV Peak grad:  2.8 mmHg    Systemic Diam: 2.10 cm MV Mean grad:  1.0 mmHg MV Vmax:       0.84 m/s MV Vmean:      44.0 cm/s MV Decel Time: 211 msec MV E velocity: 54.00 cm/s MV A velocity: 76.70 cm/s MV E/A ratio:  0.70 Lorine Bears MD Electronically signed by Lorine Bears MD Signature Date/Time: 07/11/2021/12:09:19 PM    Final    Results for orders placed or performed during the hospital encounter of 07/09/21  Resp Panel by RT-PCR (Flu A&B, Covid) Nasopharyngeal Swab     Status: None   Collection Time: 07/09/21  8:37 PM   Specimen: Nasopharyngeal Swab; Nasopharyngeal(NP) swabs in vial transport medium  Result Value Ref Range Status   SARS Coronavirus 2 by RT PCR NEGATIVE NEGATIVE Final    Comment: (NOTE) SARS-CoV-2 target nucleic acids are NOT DETECTED.  The SARS-CoV-2 RNA is generally detectable in upper respiratory specimens during the acute phase of infection. The lowest concentration of SARS-CoV-2 viral copies this assay can detect is 138 copies/mL. A negative result does not preclude SARS-Cov-2 infection and should not be used as the sole basis for treatment or other patient management decisions. A negative result may occur with  improper specimen collection/handling, submission of specimen other than nasopharyngeal swab, presence of viral mutation(s) within the areas targeted by this assay, and inadequate number of viral copies(<138 copies/mL). A negative result must be combined with clinical observations, patient history, and epidemiological information. The expected result is Negative.  Fact Sheet for Patients:   BloggerCourse.com  Fact Sheet for Healthcare Providers:  SeriousBroker.it  This test is no t yet approved or cleared by the [place] FDA and  has been authorized for detection and/or diagnosis of SARS-CoV-2 by FDA under an Emergency Use Authorization (EUA). This EUA will remain  in effect (meaning this test can be used) for the duration of the COVID-19 declaration under Section 564(b)(1) of the Act, 21 U.S.C.section 360bbb-3(b)(1),  unless the authorization is terminated  or revoked sooner.       Influenza A by PCR NEGATIVE NEGATIVE Final   Influenza B by PCR NEGATIVE NEGATIVE Final    Comment: (NOTE) The Xpert Xpress SARS-CoV-2/FLU/RSV plus assay is intended as an aid in the diagnosis of influenza from Nasopharyngeal swab specimens and should not be used as a sole basis for treatment. Nasal washings and aspirates are unacceptable for Xpert Xpress SARS-CoV-2/FLU/RSV testing.  Fact Sheet for Patients: BloggerCourse.com  Fact Sheet for Healthcare Providers: SeriousBroker.it  This test is not yet approved or cleared by the [place] FDA and has been authorized for detection and/or diagnosis of SARS-CoV-2 by FDA under an Emergency Use Authorization (EUA). This EUA will remain in effect (meaning this test can be used) for the duration of the COVID-19 declaration under Section 564(b)(1) of the Act, 21 U.S.C. section 360bbb-3(b)(1), unless the authorization is terminated or revoked.  Performed at [facility], [address] [place], [place] [address]     Signed:  Lynden Oxford MD.  [facility] 07/11/2021 , 7:36 AM

## 2021-07-25 NOTE — Progress Notes (Signed)
Follow-up Outpatient Visit Date: 07/26/2021  Primary Care Provider: Pcp, No No address on file  Chief Complaint: Follow-up NSTEMI and HFrEF  HPI:  [name] is a 60 y.o. male with history of CAD status post CABG (LIMA-LAD, SVG-D1, and SVG-OM2) and subsequent catheterization in 02/2020 at [facility] demonstrating chronically occluded SVG-D1 and acutely occluded SVG-OM1 treated with DES, chronic HFrEF due to ischemic cardiomyopathy, hyperlipidemia, type 2 diabetes mellitus, and medication noncompliance, who presents for follow-up of coronary artery disease and HFrEF in the setting of recent NSTEMI.  He was previously followed in our practice by Dr. Elease Hashimoto but wishes to follow-up with [place] in [place].  He presented to the [facility] emergency department 07/09/2021 complaining of chest pain and ruled in for NSTEMI.  He was noted to have acute/subacute occlusion of SVG-OM2 with heavy thrombus burden, which was managed medically.  LVEF was severely reduced with moderately elevated filling pressure.  He was discharged on aspirin, clopidogrel, furosemide, rosuvastatin, and dapagliflozin.  Addition of ACEI/ARB/ARNI was deferred in the setting of borderline hypotension at the time of discharge.  Today, [name] reports that he is feeling more tired than before his NSTEMI in spite of sleeping better.  He thinks some of this may be due to deconditioning as he has been quite sedentary.  He denies frank shortness of breath as well as chest pain, palpitations, and lightheadedness.  He ran out of furosemide 2 days ago but has continued to urinate frequently.  He denies significant edema as well as orthopnea.  --------------------------------------------------------------------------------------------------  Past Medical History:  Diagnosis Date   Coronary artery disease    Diabetes mellitus without complication (HCC)    Dyslipidemia (high LDL; low HDL)    Family history of premature  CAD    Headache    History of kidney stones    Low HDL (under 40)    MI (myocardial infarction) (HCC) 2016   PONV (postoperative nausea and vomiting)    as a child eye surgery   Past Surgical History:  Procedure Laterality Date   CORONARY ARTERY BYPASS GRAFT N/A 11/15/2014   Procedure: CORONARY ARTERY BYPASS GRAFTING (CABG), ON PUMP, TIMES THREE, USING LEFT INTERNAL MAMMARY ARTERY, RIGHT GREATER SAPHENOUS VEIN HARVESTED ENDOSCOPICALLY;  Surgeon: Loreli Slot, MD;  Location: [facility] OR;  Service: Open Heart Surgery;  Laterality: N/A;   CYSTOSCOPY W/ URETERAL STENT PLACEMENT Right 02/04/2017   Procedure: CYSTOSCOPY WITH STENT REPLACEMENT;  Surgeon: [name], MD;  Location: [facility] ORS;  Service: Urology;  Laterality: Right;   CYSTOSCOPY W/ URETERAL STENT REMOVAL Left 02/04/2017   Procedure: CYSTOSCOPY WITH STENT REMOVAL;  Surgeon: [name], MD;  Location: [facility] ORS;  Service: Urology;  Laterality: Left;   CYSTOSCOPY WITH STENT PLACEMENT Bilateral 01/17/2017   Procedure: CYSTOSCOPY WITH STENT PLACEMENT;  Surgeon: [name], MD;  Location: [facility] ORS;  Service: Urology;  Laterality: Bilateral;   EYE SURGERY     multiple eye surgeries as a child   INTRA-AORTIC BALLOON PUMP INSERTION N/A 11/15/2014   Procedure: INTRA-AORTIC BALLOON PUMP INSERTION;  Surgeon: Lennette Bihari, MD;  Location: [facility] CATH LAB;  Service: Cardiovascular;  Laterality: N/A;   LEFT HEART CATH AND CORONARY ANGIOGRAPHY N/A 07/10/2021   Procedure: LEFT HEART CATH AND CORONARY ANGIOGRAPHY;  Surgeon: Yvonne Kendall, MD;  Location: [facility] INVASIVE CV LAB;  Service: Cardiovascular;  Laterality: N/A;   LEFT HEART CATHETERIZATION WITH CORONARY ANGIOGRAM N/A 11/14/2014   Procedure: LEFT HEART CATHETERIZATION WITH CORONARY ANGIOGRAM;  Surgeon: Clovis Pu  Tresa Endo, MD;  Location: [facility] CATH LAB;  Service: Cardiovascular;  Laterality: N/A;   PERCUTANEOUS CORONARY STENT INTERVENTION (PCI-S) N/A 11/15/2014   Procedure: PERCUTANEOUS CORONARY  STENT INTERVENTION (PCI-S);  Surgeon: Lennette Bihari, MD;  Location: [facility] CATH LAB;  Service: Cardiovascular;  Laterality: N/A;   STOMACH SURGERY     at 55 weeks old   TEE WITHOUT CARDIOVERSION N/A 11/15/2014   Procedure: TRANSESOPHAGEAL ECHOCARDIOGRAM (TEE);  Surgeon: Loreli Slot, MD;  Location: [facility] OR;  Service: Open Heart Surgery;  Laterality: N/A;   URETEROSCOPY WITH HOLMIUM LASER LITHOTRIPSY Bilateral 01/17/2017   Procedure: URETEROSCOPY WITH HOLMIUM LASER LITHOTRIPSY;  Surgeon: [name], MD;  Location: [facility] ORS;  Service: Urology;  Laterality: Bilateral;   URETEROSCOPY WITH HOLMIUM LASER LITHOTRIPSY Right 02/04/2017   Procedure: URETEROSCOPY WITH HOLMIUM LASER LITHOTRIPSY;  Surgeon: [name], MD;  Location: [facility] ORS;  Service: Urology;  Laterality: Right;    Current Meds  Medication Sig   aspirin EC 81 MG tablet Take 1 tablet (81 mg total) by mouth daily.   clopidogrel (PLAVIX) 75 MG tablet Take 1 tablet (75 mg total) by mouth daily.   dapagliflozin propanediol (FARXIGA) 5 MG TABS tablet Take 1 tablet (5 mg total) by mouth daily before breakfast.   potassium chloride SA (KLOR-CON) 20 MEQ tablet Take 1 tablet (20 mEq total) by mouth daily.   rosuvastatin (CRESTOR) 40 MG tablet Take 1 tablet (40 mg total) by mouth daily.   traZODone (DESYREL) 50 MG tablet Take 0.5 tablets (25 mg total) by mouth at bedtime as needed for sleep.    Allergies: Coconut oil  Social History   Tobacco Use   Smoking status: Never   Smokeless tobacco: Never  Vaping Use   Vaping Use: Never used  Substance Use Topics   Alcohol use: Not Currently    Comment: 1 glass of wine once a year   Drug use: No    Family History  Problem Relation Age of Onset   Heart disease Mother    Stroke Mother    Nephrolithiasis Mother    Heart attack Father    Stroke Father    Kidney cancer Neg Hx    Bladder Cancer Neg Hx    Prostate cancer Neg Hx     Review of Systems: A 12-system review of systems  was performed and was negative except as noted in the HPI.  --------------------------------------------------------------------------------------------------  Physical Exam: BP 124/80 (BP Location: Left Arm, Patient Position: Sitting, Cuff Size: Large)   Pulse 87   Ht 5\' 11"  (1.803 m)   Wt 271 lb (122.9 kg)   SpO2 97%   BMI 37.80 kg/m   General:  NAD. Neck: No JVD or HJR. Lungs: Clear to auscultation bilaterally without wheezes or crackles. Heart: Regular rate and rhythm without murmurs, rubs, or gallops. Abdomen: Soft, nontender, nondistended. Extremities: No lower extremity edema.  Left radial arteriotomy site well-healed with 2+ pulse.  EKG: Normal sinus rhythm with low voltage and anteroseptal infarct.  Compared with prior tracing from 07/09/2021, lateral T wave changes are less pronounced.  Lab Results  Component Value Date   WBC 8.5 07/11/2021   HGB 15.1 07/11/2021   HCT 43.7 07/11/2021   MCV 88.8 07/11/2021   PLT 161 07/11/2021    Lab Results  Component Value Date   NA 135 07/11/2021   K 3.7 07/11/2021   CL 100 07/11/2021   CO2 27 07/11/2021   BUN 18 07/11/2021   CREATININE 1.19 07/11/2021   GLUCOSE  161 (H) 07/11/2021   ALT 119 (H) 12/10/2019    Lab Results  Component Value Date   CHOL 204 (H) 07/11/2021   HDL 28 (L) 07/11/2021   LDLCALC 135 (H) 07/11/2021   TRIG 205 (H) 07/11/2021   CHOLHDL 7.3 07/11/2021    --------------------------------------------------------------------------------------------------  ASSESSMENT AND PLAN: NSTEMI: [name] is status post NSTEMI from acute/subacute occlusion of previously stented SVG-OM2.  Intervention was not attempted.  [name] has not had any further angina.  We will plan to continue with medical therapy including 12 months of dual antiplatelet therapy with aspirin and clopidogrel.  He is back on a statin and tolerating this well.  We will add carvedilol 3.125 mg twice daily given improved blood pressure  since discharge.  We will reach out to cardiac rehab to ensure that [name] is enrolled in the program.  Chronic HFrEF due to ischemic cardiomyopathy: LVEF severely reduced during recent hospitalization with moderately elevated LVEDP.  [name] appears euvolemic on examination today with NYHA class II-III symptoms.  We will restart furosemide 20 mg daily as well as add carvedilol 3.125 mg twice daily.  We will have [name] return in 2 weeks and consider adding an ARB or Entresto as his blood pressure tolerates.  We will continue with dapagliflozin for management of his heart failure and diabetes mellitus.  If LVEF remains less than 35% despite optimal GDMT, referral to EP for consideration of ICD will need to be considered.  BMP to be checked today in the setting of ongoing diuresis.  Hyperlipidemia associated with type 2 diabetes mellitus: LDL and triglycerides were noted to be mildly elevated at the time of admission last month in the setting of [name] having been noncompliant with statin therapy.  He is tolerating rosuvastatin well, which we will plan to continue.  We will recheck a lipid panel in about 2 months to ensure that his lipids are at goal.  If not, addition of a PCSK9 inhibitor and/or Vascepa will need to be considered.  Continue dapagliflozin; I encouraged [name] to establish with a PCP for ongoing management of his diabetes.  Follow-up: Return to clinic in 2-3 weeks.  Yvonne Kendall, MD 07/26/2021 9:53 AM

## 2021-07-26 ENCOUNTER — Encounter: Payer: Self-pay | Admitting: Internal Medicine

## 2021-07-26 ENCOUNTER — Other Ambulatory Visit
Admission: RE | Admit: 2021-07-26 | Discharge: 2021-07-26 | Disposition: A | Discharge status: Home / Self Care | Payer: BC Managed Care – PPO | Source: Ambulatory Visit | Attending: Internal Medicine | Admitting: Internal Medicine

## 2021-07-26 ENCOUNTER — Other Ambulatory Visit: Payer: Self-pay

## 2021-07-26 ENCOUNTER — Ambulatory Visit: Payer: BC Managed Care – PPO | Admitting: Internal Medicine

## 2021-07-26 VITALS — BP 124/80 | HR 87 | Ht 71.0 in | Wt 271.0 lb

## 2021-07-26 DIAGNOSIS — E1169 Type 2 diabetes mellitus with other specified complication: Secondary | ICD-10-CM | POA: Diagnosis not present

## 2021-07-26 DIAGNOSIS — E785 Hyperlipidemia, unspecified: Secondary | ICD-10-CM

## 2021-07-26 DIAGNOSIS — I5022 Chronic systolic (congestive) heart failure: Secondary | ICD-10-CM | POA: Diagnosis present

## 2021-07-26 DIAGNOSIS — I10 Essential (primary) hypertension: Secondary | ICD-10-CM | POA: Diagnosis present

## 2021-07-26 DIAGNOSIS — I214 Non-ST elevation (NSTEMI) myocardial infarction: Secondary | ICD-10-CM | POA: Diagnosis present

## 2021-07-26 DIAGNOSIS — I255 Ischemic cardiomyopathy: Secondary | ICD-10-CM

## 2021-07-26 DIAGNOSIS — I2510 Atherosclerotic heart disease of native coronary artery without angina pectoris: Secondary | ICD-10-CM | POA: Insufficient documentation

## 2021-07-26 LAB — BASIC METABOLIC PANEL
Anion gap: 8 (ref 5–15)
BUN: 24 mg/dL — ABNORMAL HIGH (ref 6–20)
CO2: 28 mmol/L (ref 22–32)
Calcium: 9.9 mg/dL (ref 8.9–10.3)
Chloride: 101 mmol/L (ref 98–111)
Creatinine, Ser: 1.24 mg/dL (ref 0.61–1.24)
GFR, Estimated: >60 mL/min (ref >60)
Glucose, Bld: 201 mg/dL — ABNORMAL HIGH (ref 70–99)
Potassium: 3.9 mmol/L (ref 3.5–5.1)
Sodium: 137 mmol/L (ref 135–145)

## 2021-07-26 MED ORDER — CARVEDILOL 3.125 MG PO TABS: 3.1250 mg | ORAL_TABLET | Freq: Two times a day (BID) | ORAL | 1 refills | Status: DC

## 2021-07-26 MED ORDER — FUROSEMIDE 20 MG PO TABS: 20.0000 mg | ORAL_TABLET | Freq: Every day | ORAL | 3 refills | Status: DC

## 2021-07-26 NOTE — Patient Instructions (Signed)
Medication Instructions:   Your physician has recommended you make the following change in your medication:   START Carvedilol (Coreg) 3.125 mg TWICE daily   START Furosemide (Lasix) 20 mg DAILY   *If you need a refill on your cardiac medications before your next appointment, please call your pharmacy*   Lab Work:  BMET today  -  Please go to the [facility].  -  You will check in at the front desk to the right as you walk into the atrium.    If you have labs (blood work) drawn today and your tests are completely normal, you will receive your results only by: MyChart Message (if you have MyChart) OR A paper copy in the mail If you have any lab test that is abnormal or we need to change your treatment, we will call you to review the results.   Testing/Procedures:  None ordered   Follow-Up: At [facility], you and your health needs are our priority.  As part of our continuing mission to provide you with exceptional heart care, we have created designated Provider Care Teams.  These Care Teams include your primary Cardiologist (physician) and Advanced Practice Providers (APPs -  Physician Assistants and Nurse Practitioners) who all work together to provide you with the care you need, when you need it.  We recommend signing up for the patient portal called "MyChart".  Sign up information is provided on this After Visit Summary.  MyChart is used to connect with patients for Virtual Visits (Telemedicine).  Patients are able to view lab/test results, encounter notes, upcoming appointments, etc.  Non-urgent messages can be sent to your provider as well.   To learn more about what you can do with MyChart, go to [email or web address].    Your next appointment:   2 - 3 week(s)  The format for your next appointment:   In Person  Provider:   You may see Dr. Cristal Deer End or one of the following Advanced Practice Providers on your designated Care Team:   Nicolasa Ducking,  NP Eula Listen, PA-C Cadence Fransico Michael, PA-C}    Other Instructions  We will reach out to cardiac rehab regarding referral.  You may also contact cardiac rehab at [phone].

## 2021-08-08 ENCOUNTER — Telehealth: Payer: Self-pay | Admitting: Internal Medicine

## 2021-08-08 DIAGNOSIS — I2510 Atherosclerotic heart disease of native coronary artery without angina pectoris: Secondary | ICD-10-CM

## 2021-08-08 DIAGNOSIS — E785 Hyperlipidemia, unspecified: Secondary | ICD-10-CM

## 2021-08-08 DIAGNOSIS — I5022 Chronic systolic (congestive) heart failure: Secondary | ICD-10-CM

## 2021-08-08 MED ORDER — CLOPIDOGREL BISULFATE 75 MG PO TABS: 75.0000 mg | ORAL_TABLET | Freq: Every day | ORAL | 3 refills | Status: DC

## 2021-08-08 MED ORDER — POTASSIUM CHLORIDE CRYS ER 20 MEQ PO TBCR: 20.0000 meq | EXTENDED_RELEASE_TABLET | Freq: Every day | ORAL | 0 refills | Status: DC

## 2021-08-08 MED ORDER — ROSUVASTATIN CALCIUM 40 MG PO TABS: 40.0000 mg | ORAL_TABLET | Freq: Every day | ORAL | 3 refills | Status: DC

## 2021-08-08 MED ORDER — DAPAGLIFLOZIN PROPANEDIOL 5 MG PO TABS: 5.0000 mg | ORAL_TABLET | Freq: Every day | ORAL | 3 refills | Status: DC

## 2021-08-08 NOTE — Telephone Encounter (Signed)
Pt called office back.   Wanted to get clarification on which medications he was to be taking and how much. Questions answered.   Also asked for refills for Farxiga, Crestor, and Plavix. Refills sent to preferred pharmacy.   No further questions or needs, pt voiced appreciation for help.

## 2021-08-08 NOTE — Addendum Note (Signed)
Addended by: Vergia Alberts on: 08/08/2021 01:47 PM   Modules accepted: Orders

## 2021-08-08 NOTE — Telephone Encounter (Signed)
Returned patient's call. No answer. Lmtcb °

## 2021-08-08 NOTE — Telephone Encounter (Signed)
Patient calling Wants to clarify his medications with nurse to make sure he is taking the right way  Please call to discuss

## 2021-08-23 NOTE — Progress Notes (Signed)
Follow-up Outpatient Visit Date: 08/24/2021  Primary Care Provider: Pcp, No No address on file  Chief Complaint: Follow-up coronary artery disease and heart failure  HPI:  Seth Riddle is a 61 y.o. 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  I last saw him a month ago following hospitalization in late November with NSTEMI.  At our follow-up visit, Seth Riddle complained of more fatigue following his NSTEMI.  He had run out of furosemide shortly after leaving the hospital.  We agreed to add carvedilol 3.125 mg BID and restart furosemide 20 mg daily.  Today, Seth Riddle reports that he is feeling quite well.  He feels essentially back to normal compared to before his NSTEMI in November.  He notes increased urinary frequency since starting furosemide.  He has not had any chest pain, shortness of breath, palpitations, lightheadedness, or edema.  He does not check his blood pressure at home.  He is exercising at least 3 days a week, including using an elliptical machine and walking on the treadmill.  He reports that his weight has been trending down slightly with his exercise regimen.  He is also trying to change his diet, specifically cut down on sodas.  He is tolerating his medications well, including rosuvastatin.  --------------------------------------------------------------------------------------------------  Past Medical History:  Diagnosis Date   Coronary artery disease    Diabetes mellitus without complication ([place])    Dyslipidemia (high LDL; low HDL)    Family history of premature CAD    Headache    History  of kidney stones    Low HDL (under 40)    MI (myocardial infarction) ([place]) 2016   PONV (postoperative nausea and vomiting)    as a child eye surgery   Past Surgical History:  Procedure Laterality Date   CORONARY ARTERY BYPASS GRAFT N/A 11/15/2014   Procedure: CORONARY ARTERY BYPASS GRAFTING (CABG), ON PUMP, TIMES THREE, USING LEFT INTERNAL MAMMARY ARTERY, RIGHT GREATER SAPHENOUS VEIN HARVESTED ENDOSCOPICALLY;  Surgeon: Melrose Nakayama, MD;  Location: [facility];  Service: Open Heart Surgery;  Laterality: N/A;   CYSTOSCOPY W/ URETERAL STENT PLACEMENT Right 02/04/2017   Procedure: CYSTOSCOPY WITH STENT REPLACEMENT;  Surgeon: Hollice Espy, MD;  Location: [facility];  Service: Urology;  Laterality: Right;   CYSTOSCOPY W/ URETERAL STENT REMOVAL Left 02/04/2017   Procedure: CYSTOSCOPY WITH STENT REMOVAL;  Surgeon: Hollice Espy, MD;  Location: [facility];  Service: Urology;  Laterality: Left;   CYSTOSCOPY WITH STENT PLACEMENT Bilateral 01/17/2017   Procedure: CYSTOSCOPY WITH STENT PLACEMENT;  Surgeon: Hollice Espy, MD;  Location: [facility];  Service: Urology;  Laterality: Bilateral;   EYE SURGERY     multiple eye surgeries as a child   INTRA-AORTIC BALLOON PUMP INSERTION N/A 11/15/2014   Procedure: INTRA-AORTIC BALLOON PUMP INSERTION;  Surgeon: Troy Sine, MD;  Location: [facility] CATH LAB;  Service: Cardiovascular;  Laterality: N/A;   LEFT HEART CATH AND CORONARY ANGIOGRAPHY N/A 07/10/2021   Procedure: LEFT HEART CATH AND CORONARY ANGIOGRAPHY;  Surgeon: Nelva Bush, MD;  Location: [facility] CV LAB;  Service: Cardiovascular;  Laterality: N/A;   LEFT HEART CATHETERIZATION WITH CORONARY ANGIOGRAM N/A 11/14/2014   Procedure: LEFT HEART CATHETERIZATION WITH CORONARY ANGIOGRAM;  Surgeon: Marcello Moores  Floyce Stakes, MD;  Location: [facility] CATH LAB;  Service: Cardiovascular;  Laterality: N/A;   PERCUTANEOUS CORONARY STENT INTERVENTION (PCI-S) N/A 11/15/2014   Procedure: PERCUTANEOUS CORONARY STENT INTERVENTION (PCI-S);   Surgeon: Troy Sine, MD;  Location: [facility] CATH LAB;  Service: Cardiovascular;  Laterality: N/A;   STOMACH SURGERY     at 39 weeks old   TEE WITHOUT CARDIOVERSION N/A 11/15/2014   Procedure: TRANSESOPHAGEAL ECHOCARDIOGRAM (TEE);  Surgeon: Melrose Nakayama, MD;  Location: [place];  Service: Open Heart Surgery;  Laterality: N/A;   URETEROSCOPY WITH HOLMIUM LASER LITHOTRIPSY Bilateral 01/17/2017   Procedure: URETEROSCOPY WITH HOLMIUM LASER LITHOTRIPSY;  Surgeon: Hollice Espy, MD;  Location: [facility];  Service: Urology;  Laterality: Bilateral;   URETEROSCOPY WITH HOLMIUM LASER LITHOTRIPSY Right 02/04/2017   Procedure: URETEROSCOPY WITH HOLMIUM LASER LITHOTRIPSY;  Surgeon: Hollice Espy, MD;  Location: [facility];  Service: Urology;  Laterality: Right;    Current Meds  Medication Sig   aspirin EC 81 MG tablet Take 1 tablet (81 mg total) by mouth daily.   carvedilol (COREG) 3.125 MG tablet Take 1 tablet (3.125 mg total) by mouth 2 (two) times daily with a meal.   clopidogrel (PLAVIX) 75 MG tablet Take 1 tablet (75 mg total) by mouth daily.   dapagliflozin propanediol (FARXIGA) 5 MG TABS tablet Take 1 tablet (5 mg total) by mouth daily before breakfast.   furosemide (LASIX) 20 MG tablet Take 1 tablet (20 mg total) by mouth daily.   potassium chloride SA (KLOR-CON M) 20 MEQ tablet Take 1 tablet (20 mEq total) by mouth daily.   rosuvastatin (CRESTOR) 40 MG tablet Take 1 tablet (40 mg total) by mouth daily.   traZODone (DESYREL) 50 MG tablet Take 0.5 tablets (25 mg total) by mouth at bedtime as needed for sleep.    Allergies: Coconut oil  Social History   Tobacco Use   Smoking status: Never   Smokeless tobacco: Never  Vaping Use   Vaping Use: Never used  Substance Use Topics   Alcohol use: Yes    Comment: 1 glass of wine once a year   Drug use: No    Family History  Problem Relation Age of Onset   Heart disease Mother    Stroke Mother    Nephrolithiasis Mother    Heart attack Father     Stroke Father    Kidney cancer Neg Hx    Bladder Cancer Neg Hx    Prostate cancer Neg Hx     Review of Systems: A 12-system review of systems was performed and was negative except as noted in the HPI.  --------------------------------------------------------------------------------------------------  Physical Exam: BP 100/80 (BP Location: Left Arm, Patient Position: Sitting, Cuff Size: Large)    Pulse 80    Ht 5\' 11"  (1.803 m)    Wt 270 lb (122.5 kg)    SpO2 97%    BMI 37.66 kg/m   General:  NAD. Neck: No JVD or HJR. Lungs: Clear to auscultation bilaterally without wheezes or crackles. Heart: Regular rate and rhythm without murmurs, rubs, or gallops. Abdomen: Soft, nontender, nondistended. Extremities: No lower extremity edema.  EKG: Normal sinus rhythm with anteroseptal MI.  No significant change from prior tracing on 07/26/2021.  Lab Results  Component Value Date   WBC 8.5 07/11/2021   HGB 15.1 07/11/2021   HCT 43.7 07/11/2021   MCV 88.8 07/11/2021   PLT 161 07/11/2021    Lab Results  Component Value Date   NA 137 07/26/2021  K 3.9 07/26/2021   CL 101 07/26/2021   CO2 28 07/26/2021   BUN 24 (H) 07/26/2021   CREATININE 1.24 07/26/2021   GLUCOSE 201 (H) 07/26/2021   ALT 119 (H) 12/10/2019    Lab Results  Component Value Date   CHOL 204 (H) 07/11/2021   HDL 28 (L) 07/11/2021   LDLCALC 135 (H) 07/11/2021   TRIG 205 (H) 07/11/2021   CHOLHDL 7.3 07/11/2021    --------------------------------------------------------------------------------------------------  ASSESSMENT AND PLAN: Coronary artery disease: Seth Riddle has recovered well from his NSTEMI in November without further chest pain.  We will plan to continue 12 months of dual antiplatelet therapy as well as aggressive secondary prevention with rosuvastatin.  Seth Riddle again declines referral to cardiac rehab, as he is exercising regularly on his own.  Chronic HFrEF due to ischemic  cardiomyopathy: Seth Riddle appears euvolemic and reports NYHA class I symptoms at this time.  Echo at the time of his NSTEMI in November showed an LVEF of 30-35%, having previously been moderately reduced before this most recent MI.  He is tolerating addition of low-dose carvedilol at our last visit well.  Unfortunately, his soft blood pressure today precludes further escalation of his goal-directed medical therapy.  We will therefore continue current regimen of carvedilol and dapagliflozin.  I have asked Seth Riddle to monitor his blood pressure at home.  If it is consistently higher than today's reading at home, we could consider challenging him with an ARB or Entresto at our next visit.  Once we have optimized his goal-directed medical therapy, we will need to repeat an echocardiogram to determine if his LVEF has improved.  If not, he may need referral to EP to discuss ICD placement for primary prevention.  Hyperlipidemia: Seth Riddle is tolerating rosuvastatin well and remains compliant.  I will check a CMP and lipid panel today to assess response.  If LDL remains above 70, we will need to consider addition of ezetimibe versus PCSK9 inhibitor.  Follow-up: Return to clinic in 1 month.  Nelva Bush, MD 08/24/2021 9:25 AM

## 2021-08-24 ENCOUNTER — Other Ambulatory Visit: Payer: Self-pay

## 2021-08-24 ENCOUNTER — Encounter: Payer: Self-pay | Admitting: Internal Medicine

## 2021-08-24 ENCOUNTER — Ambulatory Visit: Payer: BC Managed Care – PPO | Admitting: Internal Medicine

## 2021-08-24 VITALS — BP 100/80 | HR 80 | Ht 71.0 in | Wt 270.0 lb

## 2021-08-24 DIAGNOSIS — I255 Ischemic cardiomyopathy: Secondary | ICD-10-CM | POA: Diagnosis not present

## 2021-08-24 DIAGNOSIS — E785 Hyperlipidemia, unspecified: Secondary | ICD-10-CM

## 2021-08-24 DIAGNOSIS — I5022 Chronic systolic (congestive) heart failure: Secondary | ICD-10-CM

## 2021-08-24 DIAGNOSIS — I2510 Atherosclerotic heart disease of native coronary artery without angina pectoris: Secondary | ICD-10-CM | POA: Diagnosis not present

## 2021-08-24 DIAGNOSIS — I214 Non-ST elevation (NSTEMI) myocardial infarction: Secondary | ICD-10-CM

## 2021-08-24 NOTE — Patient Instructions (Signed)
Medication Instructions:   Your physician recommends that you continue on your current medications as directed. Please refer to the Current Medication list given to you today.  *If you need a refill on your cardiac medications before your next appointment, please call your pharmacy*   Lab Work:  Today: Lipid, CMET  If you have labs (blood work) drawn today and your tests are completely normal, you will receive your results only by: MyChart Message (if you have MyChart) OR A paper copy in the mail If you have any lab test that is abnormal or we need to change your treatment, we will call you to review the results.   Testing/Procedures:  None ordered   Follow-Up: At [facility], you and your health needs are our priority.  As part of our continuing mission to provide you with exceptional heart care, we have created designated Provider Care Teams.  These Care Teams include your primary Cardiologist (physician) and Advanced Practice Providers (APPs -  Physician Assistants and Nurse Practitioners) who all work together to provide you with the care you need, when you need it.  We recommend signing up for the patient portal called "MyChart".  Sign up information is provided on this After Visit Summary.  MyChart is used to connect with patients for Virtual Visits (Telemedicine).  Patients are able to view lab/test results, encounter notes, upcoming appointments, etc.  Non-urgent messages can be sent to your provider as well.   To learn more about what you can do with MyChart, go to [email or web address].    Your next appointment:   1 month(s)  The format for your next appointment:   In Person  Provider:   You may see Dr. Cristal Deer End or one of the following Advanced Practice Providers on your designated Care Team:   Nicolasa Ducking, NP Eula Listen, PA-C Cadence Fransico Michael, [place]

## 2021-08-25 LAB — COMPREHENSIVE METABOLIC PANEL
ALT: 57 IU/L — ABNORMAL HIGH (ref 0–44)
AST: 48 IU/L — ABNORMAL HIGH (ref 0–40)
Albumin/Globulin Ratio: 1.8 (ref 1.2–2.2)
Albumin: 4.6 g/dL (ref 3.8–4.9)
Alkaline Phosphatase: 103 IU/L (ref 44–121)
BUN/Creatinine Ratio: 23 (ref 10–24)
BUN: 25 mg/dL (ref 8–27)
Bilirubin Total: 1.1 mg/dL (ref 0.0–1.2)
CO2: 22 mmol/L (ref 20–29)
Calcium: 9.6 mg/dL (ref 8.6–10.2)
Chloride: 99 mmol/L (ref 96–106)
Creatinine, Ser: 1.08 mg/dL (ref 0.76–1.27)
Globulin, Total: 2.5 g/dL (ref 1.5–4.5)
Glucose: 193 mg/dL — ABNORMAL HIGH (ref 70–99)
Potassium: 4.1 mmol/L (ref 3.5–5.2)
Sodium: 138 mmol/L (ref 134–144)
Total Protein: 7.1 g/dL (ref 6.0–8.5)
eGFR: 79 mL/min/{1.73_m2} (ref >59)

## 2021-08-25 LAB — LIPID PANEL
Chol/HDL Ratio: 5.2 ratio — ABNORMAL HIGH (ref 0.0–5.0)
Cholesterol, Total: 135 mg/dL (ref 100–199)
HDL: 26 mg/dL — ABNORMAL LOW (ref >39)
LDL Chol Calc (NIH): 64 mg/dL (ref 0–99)
Triglycerides: 277 mg/dL — ABNORMAL HIGH (ref 0–149)
VLDL Cholesterol Cal: 45 mg/dL — ABNORMAL HIGH (ref 5–40)

## 2021-08-28 ENCOUNTER — Telehealth: Payer: Self-pay | Admitting: *Deleted

## 2021-08-28 DIAGNOSIS — E785 Hyperlipidemia, unspecified: Secondary | ICD-10-CM

## 2021-08-28 DIAGNOSIS — Z79899 Other long term (current) drug therapy: Secondary | ICD-10-CM

## 2021-08-28 DIAGNOSIS — I2510 Atherosclerotic heart disease of native coronary artery without angina pectoris: Secondary | ICD-10-CM

## 2021-08-28 MED ORDER — ICOSAPENT ETHYL 1 G PO CAPS: 2.0000 g | ORAL_CAPSULE | Freq: Two times a day (BID) | ORAL | 2 refills | Status: DC

## 2021-08-28 NOTE — Telephone Encounter (Signed)
-----   Message from Christopher End, MD sent at 08/27/2021  1:55 PM EST ----- °Please let Seth Riddle know that his cholesterol has improved quite a bit and that his LDL is now at goal (less than 70).  Unfortunately, his triglycerides remain elevated, which is often driven by his diet.  If he is willing to try an additional medicine to help improve his triglycerides and lower his cardiovascular risk, I suggest he start taking Vascepa 2 g twice daily, with a follow-up fasting lipid panel and ALT in 3 months.  Diet and exercise should help as well.  His AST and ALT are also mildly elevated but better compared to last check.  I recommend that he speak with his or establish with a PCP for further evaluation. °

## 2021-08-28 NOTE — Telephone Encounter (Signed)
Attempted to call pt. No answer. Lmtcb.  

## 2021-08-28 NOTE — Telephone Encounter (Signed)
Spoke with pt. Notified of lab results and Dr. Serita Kyle recc.  Pt voiced understanding.  Pt agrees to start Vascepa along with diet and exercise.  Rx sent to pt's pharmacy for Vascepa 2 g twice daily.  Pt requested 90 day supply.  Orders placed for lipid panel and ALT in 3 months. Sent to scheduling to place recall appt.  Pt states that he will speak with his PCP regarding elevated AST/ALT.  Pt has no further questions at this time.

## 2021-08-28 NOTE — Telephone Encounter (Signed)
-----   Message from Yvonne Kendall, MD sent at 08/27/2021  1:55 PM EST ----- Please let Seth Riddle know that his cholesterol has improved quite a bit and that his LDL is now at goal (less than 70).  Unfortunately, his triglycerides remain elevated, which is often driven by his diet.  If he is willing to try an additional medicine to help improve his triglycerides and lower his cardiovascular risk, I suggest he start taking Vascepa 2 g twice daily, with a follow-up fasting lipid panel and ALT in 3 months.  Diet and exercise should help as well.  His AST and ALT are also mildly elevated but better compared to last check.  I recommend that he speak with his or establish with a PCP for further evaluation.

## 2021-08-28 NOTE — Telephone Encounter (Signed)
done

## 2021-09-18 ENCOUNTER — Other Ambulatory Visit: Payer: Self-pay | Admitting: Internal Medicine

## 2021-10-04 ENCOUNTER — Ambulatory Visit: Payer: BC Managed Care – PPO | Admitting: Internal Medicine

## 2021-10-15 ENCOUNTER — Telehealth: Payer: Self-pay | Admitting: *Deleted

## 2021-10-15 NOTE — Telephone Encounter (Signed)
PA completed and submitted via CoverMymeds. It has been approved per notation.  Message from plan: Your PA request has been approved. Additional information will be provided in the approval communication. (Message 1145)

## 2021-11-14 ENCOUNTER — Other Ambulatory Visit: Payer: Self-pay

## 2021-11-14 ENCOUNTER — Other Ambulatory Visit (INDEPENDENT_AMBULATORY_CARE_PROVIDER_SITE_OTHER): Payer: BC Managed Care – PPO

## 2021-11-14 DIAGNOSIS — I2510 Atherosclerotic heart disease of native coronary artery without angina pectoris: Secondary | ICD-10-CM

## 2021-11-14 DIAGNOSIS — E785 Hyperlipidemia, unspecified: Secondary | ICD-10-CM

## 2021-11-14 DIAGNOSIS — Z79899 Other long term (current) drug therapy: Secondary | ICD-10-CM

## 2021-11-14 NOTE — Telephone Encounter (Signed)
Vascepa 1 GM approved  ?

## 2021-11-15 ENCOUNTER — Other Ambulatory Visit: Payer: BC Managed Care – PPO

## 2021-11-15 LAB — LIPID PANEL
Chol/HDL Ratio: 6.1 ratio — ABNORMAL HIGH (ref 0.0–5.0)
Cholesterol, Total: 170 mg/dL (ref 100–199)
HDL: 28 mg/dL — ABNORMAL LOW (ref >39)
LDL Chol Calc (NIH): 107 mg/dL — ABNORMAL HIGH (ref 0–99)
Triglycerides: 202 mg/dL — ABNORMAL HIGH (ref 0–149)
VLDL Cholesterol Cal: 35 mg/dL (ref 5–40)

## 2021-11-15 LAB — ALT: ALT: 35 IU/L (ref 0–44)

## 2021-11-16 ENCOUNTER — Telehealth: Payer: Self-pay | Admitting: *Deleted

## 2021-11-16 DIAGNOSIS — E785 Hyperlipidemia, unspecified: Secondary | ICD-10-CM

## 2021-11-16 DIAGNOSIS — Z79899 Other long term (current) drug therapy: Secondary | ICD-10-CM

## 2021-11-16 MED ORDER — EZETIMIBE 10 MG PO TABS: 10.0000 mg | ORAL_TABLET | Freq: Every day | ORAL | 3 refills | Status: DC

## 2021-11-16 NOTE — Telephone Encounter (Signed)
-----   Message from Nelva Bush, MD sent at 11/16/2021  9:17 AM EDT ----- ?Please let Seth Riddle know that his cholesterol has worsened significantly compared to 2 months ago, with his LDL above our goal of 70.  His triglycerides have only improved a little despite addition of Vascepa.  Please confirm that he is taking rosuvastatin and Vascepa as prescribed.  If not, I recommend that he restart the medications.  If he has been taking both regularly, I recommend addition of ezetimibe 10 mg daily.  In either case, we should recheck a fasting lipid panel and ALT in 3 months.  He should continue to work on diet and exercise to help improve his lipids as well.  His ALT (liver test) is back in the normal range. ?

## 2021-11-16 NOTE — Telephone Encounter (Signed)
Called and notified pt of lab results and Dr. Darnelle Bos recc.  ?Pt confirms that he is taking both rosuvastatin and Vascepa regularly.  ? ?Pt will START ezetimibe 10 mg daily. Requests 90 day supply to [facility].  ?Rx sent. Lab orders placed and forwarded to scheduling to place lab appt recall.  ? ?Pt understands to also continue to work on diet and exercise to improve lipids as well.  ?Pt has no further questions at this time.  ? ?Forwarding to scheduling to place recall for 3 month fasting lipid / ALT.  ?

## 2021-11-21 NOTE — Progress Notes (Signed)
? ?Follow-up Outpatient Visit ?Date: 11/22/2021 ? ?Primary Care Provider: ?Pcp, No ?No address on file ? ?Chief Complaint: Follow-up coronary artery disease and HFrEF ? ?HPI:  Seth Riddle is a 61 y.o. male with history of CAD status post CABG (LIMA-LAD, SVG-D1, and SVG-OM2) and subsequent catheterization in 02/2020 at [facility] demonstrating chronically occluded SVG-D1 and acutely occluded SVG-OM1 treated with DES, chronic HFrEF due to ischemic cardiomyopathy, hyperlipidemia, and type 2 diabetes mellitus, who presents for follow-up of coronary artery disease and heart failure.  I last saw him in 08/2021, at which time he was feeling fairly well.  He was exercising regularly at home.  We did not make any medication changes or pursue additional testing.  Recent lipid panel was notable for LDL above goal, prompting [place] to add ezetimibe to rosuvastatin and Vascepa.  He just started this medication. ? ?Today, Seth Riddle reports that he continues to feel fairly well though he is in chronic pain related to a fall that occurred while he was hospitalized several years ago at [facility].  He attributes his elevated blood pressure today to this pain as well as having not yet taken carvedilol this morning.  He notes his home blood pressures are typically in the 120's/80's.  He is also recovering from a "deep chest cold".  He was not treated with antibiotics.  He has not had any chest pain, shortness of breath, palpitations, lightheadedness, or edema.  He continues to exercise daily without limitations. ? ?-------------------------------------------------------------------------------------------------- ? ?Past Medical History:  ?Diagnosis Date  ? Coronary artery disease   ? Diabetes mellitus without complication ([place])   ? Dyslipidemia (high LDL; low HDL)   ? Family history of premature CAD   ? Headache   ? History of kidney stones   ? Low HDL (under 40)   ? MI (myocardial infarction) ([place]) 2016  ? PONV  (postoperative nausea and vomiting)   ? as a child eye surgery  ? ?Past Surgical History:  ?Procedure Laterality Date  ? CORONARY ARTERY BYPASS GRAFT N/A 11/15/2014  ? Procedure: CORONARY ARTERY BYPASS GRAFTING (CABG), ON PUMP, TIMES THREE, USING LEFT INTERNAL MAMMARY ARTERY, RIGHT GREATER SAPHENOUS VEIN HARVESTED ENDOSCOPICALLY;  Surgeon: Melrose Nakayama, MD;  Location: [place];  Service: Open Heart Surgery;  Laterality: N/A;  ? CYSTOSCOPY W/ URETERAL STENT PLACEMENT Right 02/04/2017  ? Procedure: CYSTOSCOPY WITH STENT REPLACEMENT;  Surgeon: Hollice Espy, MD;  Location: [facility];  Service: Urology;  Laterality: Right;  ? CYSTOSCOPY W/ URETERAL STENT REMOVAL Left 02/04/2017  ? Procedure: CYSTOSCOPY WITH STENT REMOVAL;  Surgeon: Hollice Espy, MD;  Location: [facility];  Service: Urology;  Laterality: Left;  ? CYSTOSCOPY WITH STENT PLACEMENT Bilateral 01/17/2017  ? Procedure: CYSTOSCOPY WITH STENT PLACEMENT;  Surgeon: Hollice Espy, MD;  Location: [facility];  Service: Urology;  Laterality: Bilateral;  ? EYE SURGERY    ? multiple eye surgeries as a child  ? INTRA-AORTIC BALLOON PUMP INSERTION N/A 11/15/2014  ? Procedure: INTRA-AORTIC BALLOON PUMP INSERTION;  Surgeon: Troy Sine, MD;  Location: [facility] CATH LAB;  Service: Cardiovascular;  Laterality: N/A;  ? LEFT HEART CATH AND CORONARY ANGIOGRAPHY N/A 07/10/2021  ? Procedure: LEFT HEART CATH AND CORONARY ANGIOGRAPHY;  Surgeon: Nelva Bush, MD;  Location: [place] CV LAB;  Service: Cardiovascular;  Laterality: N/A;  ? LEFT HEART CATHETERIZATION WITH CORONARY ANGIOGRAM N/A 11/14/2014  ? Procedure: LEFT HEART CATHETERIZATION WITH CORONARY ANGIOGRAM;  Surgeon: Troy Sine, MD;  Location: [facility] CATH LAB;  Service: Cardiovascular;  Laterality: N/A;  ? PERCUTANEOUS CORONARY STENT INTERVENTION (PCI-S) N/A 11/15/2014  ? Procedure: PERCUTANEOUS CORONARY STENT INTERVENTION (PCI-S);  Surgeon: Troy Sine, MD;  Location: [facility] CATH LAB;  Service: Cardiovascular;   Laterality: N/A;  ? STOMACH SURGERY    ? at 67 weeks old  ? TEE WITHOUT CARDIOVERSION N/A 11/15/2014  ? Procedure: TRANSESOPHAGEAL ECHOCARDIOGRAM (TEE);  Surgeon: Melrose Nakayama, MD;  Location: [place];  Service: Open Heart Surgery;  Laterality: N/A;  ? URETEROSCOPY WITH HOLMIUM LASER LITHOTRIPSY Bilateral 01/17/2017  ? Procedure: URETEROSCOPY WITH HOLMIUM LASER LITHOTRIPSY;  Surgeon: Hollice Espy, MD;  Location: [facility];  Service: Urology;  Laterality: Bilateral;  ? URETEROSCOPY WITH HOLMIUM LASER LITHOTRIPSY Right 02/04/2017  ? Procedure: URETEROSCOPY WITH HOLMIUM LASER LITHOTRIPSY;  Surgeon: Hollice Espy, MD;  Location: [facility];  Service: Urology;  Laterality: Right;  ? ? ? ?Recent CV Pertinent Labs: ?Lab Results  ?Component Value Date  ? CHOL 170 11/14/2021  ? HDL 28 (L) 11/14/2021  ? LDLCALC 107 (H) 11/14/2021  ? TRIG 202 (H) 11/14/2021  ? CHOLHDL 6.1 (H) 11/14/2021  ? CHOLHDL 7.3 07/11/2021  ? INR 1.26 11/15/2014  ? K 4.1 08/24/2021  ? MG 2.2 07/11/2021  ? BUN 25 08/24/2021  ? CREATININE 1.08 08/24/2021  ? CREATININE 1.12 05/20/2016  ? ? ?Past medical and surgical history were reviewed and updated in EPIC. ? ?Current Meds  ?Medication Sig  ? aspirin EC 81 MG tablet Take 1 tablet (81 mg total) by mouth daily.  ? carvedilol (COREG) 3.125 MG tablet TAKE 1 TABLET BY MOUTH TWICE A DAY WITH A MEAL  ? clopidogrel (PLAVIX) 75 MG tablet Take 1 tablet (75 mg total) by mouth daily.  ? dapagliflozin propanediol (FARXIGA) 5 MG TABS tablet Take 1 tablet (5 mg total) by mouth daily before breakfast.  ? ezetimibe (ZETIA) 10 MG tablet Take 1 tablet (10 mg total) by mouth daily.  ? furosemide (LASIX) 20 MG tablet Take 1 tablet (20 mg total) by mouth daily.  ? icosapent Ethyl (VASCEPA) 1 g capsule Take 2 capsules (2 g total) by mouth 2 (two) times daily.  ? potassium chloride SA (KLOR-CON M) 20 MEQ tablet Take 1 tablet (20 mEq total) by mouth daily.  ? rosuvastatin (CRESTOR) 40 MG tablet Take 1 tablet (40 mg total) by  mouth daily.  ? traZODone (DESYREL) 50 MG tablet Take 0.5 tablets (25 mg total) by mouth at bedtime as needed for sleep.  ? ? ?Allergies: Coconut oil ? ?Social History  ? ?Tobacco Use  ? Smoking status: Never  ? Smokeless tobacco: Never  ?Vaping Use  ? Vaping Use: Never used  ?Substance Use Topics  ? Alcohol use: Yes  ?  Comment: 1 glass of wine once a year  ? Drug use: No  ? ? ?Family History  ?Problem Relation Age of Onset  ? Heart disease Mother   ? Stroke Mother   ? Nephrolithiasis Mother   ? Heart attack Father   ? Stroke Father   ? Kidney cancer Neg Hx   ? Bladder Cancer Neg Hx   ? Prostate cancer Neg Hx   ? ? ?Review of Systems: ?A 12-system review of systems was performed and was negative except as noted in the HPI. ? ?-------------------------------------------------------------------------------------------------- ? ?Physical Exam: ?BP 140/88 (BP Location: Left Arm, Patient Position: Sitting, Cuff Size: Large)   Pulse 77   Ht 5\' 11"  (1.803 m)   Wt 263 lb 6 oz (119.5 kg)  SpO2 98%   BMI 36.73 kg/m?  ? ?General:  NAD. ?Neck: No JVD or HJR. ?Lungs: Clear to auscultation bilaterally without wheezes or crackles. ?Heart: Regular rate and rhythm without murmurs, rubs, or gallops. ?Abdomen: Soft, nontender, nondistended. ?Extremities: No lower extremity edema. ? ?EKG: Normal sinus rhythm with anteroseptal infarct and lateral T wave inversions.  No significant change from prior tracing on 08/24/2021. ? ?Lab Results  ?Component Value Date  ? WBC 8.5 07/11/2021  ? HGB 15.1 07/11/2021  ? HCT 43.7 07/11/2021  ? MCV 88.8 07/11/2021  ? PLT 161 07/11/2021  ? ? ?Lab Results  ?Component Value Date  ? NA 138 08/24/2021  ? K 4.1 08/24/2021  ? CL 99 08/24/2021  ? CO2 22 08/24/2021  ? BUN 25 08/24/2021  ? CREATININE 1.08 08/24/2021  ? GLUCOSE 193 (H) 08/24/2021  ? ALT 35 11/14/2021  ? ? ?Lab Results  ?Component Value Date  ? CHOL 170 11/14/2021  ? HDL 28 (L) 11/14/2021  ? LDLCALC 107 (H) 11/14/2021  ? TRIG 202 (H)  11/14/2021  ? CHOLHDL 6.1 (H) 11/14/2021  ? ? ?-------------------------------------------------------------------------------------------------- ? ?ASSESSMENT AND PLAN: ?Coronary artery disease: ?No angina reported.  Mr. Sharyn Lull

## 2021-11-22 ENCOUNTER — Encounter: Payer: Self-pay | Admitting: Internal Medicine

## 2021-11-22 ENCOUNTER — Ambulatory Visit: Payer: BC Managed Care – PPO | Admitting: Internal Medicine

## 2021-11-22 VITALS — BP 140/88 | HR 77 | Ht 71.0 in | Wt 263.4 lb

## 2021-11-22 DIAGNOSIS — E1169 Type 2 diabetes mellitus with other specified complication: Secondary | ICD-10-CM

## 2021-11-22 DIAGNOSIS — I5022 Chronic systolic (congestive) heart failure: Secondary | ICD-10-CM

## 2021-11-22 DIAGNOSIS — I255 Ischemic cardiomyopathy: Secondary | ICD-10-CM

## 2021-11-22 DIAGNOSIS — I2510 Atherosclerotic heart disease of native coronary artery without angina pectoris: Secondary | ICD-10-CM | POA: Diagnosis not present

## 2021-11-22 DIAGNOSIS — E785 Hyperlipidemia, unspecified: Secondary | ICD-10-CM

## 2021-11-22 MED ORDER — LOSARTAN POTASSIUM 25 MG PO TABS: 12.5000 mg | ORAL_TABLET | Freq: Every day | ORAL | 3 refills | Status: DC

## 2021-11-22 NOTE — Patient Instructions (Signed)
Medication Instructions:  ? ?Your physician has recommended you make the following change in your medication:  ? ?START Losartan 12.5 mg daily  ? ?*If you need a refill on your cardiac medications before your next appointment, please call your pharmacy* ? ? ?Lab Work: ? ?Your physician recommends that you return for lab work in: 2 WEEKS (BMET)  ?  - This lab does not require fasting ? ?Testing/Procedures: ? ?Your physician has requested that you have an echocardiogram in 6-8 WEEKS. Echocardiography is a painless test that uses sound waves to create images of your heart. It provides your doctor with information about the size and shape of your heart and how well your heart?s chambers and valves are working. This procedure takes approximately one hour. There are no restrictions for this procedure. ? ? ?Follow-Up: ?At [facility], you and your health needs are our priority.  As part of our continuing mission to provide you with exceptional heart care, we have created designated Provider Care Teams.  These Care Teams include your primary Cardiologist (physician) and Advanced Practice Providers (APPs -  Physician Assistants and Nurse Practitioners) who all work together to provide you with the care you need, when you need it. ? ?We recommend signing up for the patient portal called "MyChart".  Sign up information is provided on this After Visit Summary.  MyChart is used to connect with patients for Virtual Visits (Telemedicine).  Patients are able to view lab/test results, encounter notes, upcoming appointments, etc.  Non-urgent messages can be sent to your provider as well.   ?To learn more about what you can do with MyChart, go to [email or web address].   ? ?Your next appointment:   ? ?Follow up shortly after echo ? ?The format for your next appointment:   ?In Person ? ?Provider:   ?You may see Dr. Cristal Deer End or one of the following Advanced Practice Providers on your designated Care Team:   ?Nicolasa Ducking, NP ?Eula Listen, PA-C ?Cadence Fransico Michael, PA-C ?

## 2021-12-04 ENCOUNTER — Telehealth: Payer: Self-pay | Admitting: Internal Medicine

## 2021-12-04 DIAGNOSIS — I5022 Chronic systolic (congestive) heart failure: Secondary | ICD-10-CM

## 2021-12-04 DIAGNOSIS — Z79899 Other long term (current) drug therapy: Secondary | ICD-10-CM

## 2021-12-04 DIAGNOSIS — I10 Essential (primary) hypertension: Secondary | ICD-10-CM

## 2021-12-04 NOTE — Telephone Encounter (Signed)
Labs (BMET) were scheduled specifically for 2 weeks after starting Losartan at office visit 11/22/21.  ? ?Attempted to call pt. No answer. Lmtcb.  ? ?If pt cannot arrive tomorrow for lab in our office, we may ask if pt would agree to have labs drawn at medical mall at [facility], no appt needed, may go at convenience. Will discuss when pt returns call.  ?

## 2021-12-04 NOTE — Telephone Encounter (Signed)
Patient canceled his labs scheduled for 04/19 and requested to reschedule for June. There was no template available to reschedule. Please advise. ?

## 2021-12-05 ENCOUNTER — Other Ambulatory Visit: Payer: BC Managed Care – PPO

## 2021-12-06 NOTE — Telephone Encounter (Signed)
Attempted to call pt again. No answer. Lmtcb x 2.  

## 2021-12-06 NOTE — Telephone Encounter (Signed)
Pt is returning call.  

## 2021-12-06 NOTE — Telephone Encounter (Signed)
Spoke with pt. Notified of message below.  ?Pt is agreeable to arrive at the medical mall when he is back in town.  ?Pt understands instructions of having labs at the medical mall.  ? ?Orders updated. No further questions at this time.  ?

## 2022-01-10 ENCOUNTER — Other Ambulatory Visit: Payer: BC Managed Care – PPO | Admitting: Internal Medicine

## 2022-01-16 ENCOUNTER — Ambulatory Visit: Payer: BC Managed Care – PPO | Admitting: Internal Medicine

## 2022-02-07 ENCOUNTER — Other Ambulatory Visit: Payer: Self-pay | Admitting: *Deleted

## 2022-02-07 ENCOUNTER — Telehealth: Payer: Self-pay | Admitting: Internal Medicine

## 2022-02-07 DIAGNOSIS — Z79899 Other long term (current) drug therapy: Secondary | ICD-10-CM

## 2022-02-07 DIAGNOSIS — E1169 Type 2 diabetes mellitus with other specified complication: Secondary | ICD-10-CM

## 2022-02-07 NOTE — Telephone Encounter (Signed)
LMOV on VM about completing labs in [facility] [place]

## 2022-02-15 ENCOUNTER — Other Ambulatory Visit: Payer: BC Managed Care – PPO

## 2022-02-18 NOTE — Telephone Encounter (Signed)
Attempted to call pt. LDM (Ok per [employer]) reminding pt to have fasting labs at the medical mall (Lipid/LFT/BMET).  Instructions given on vm. Advised pt call with any further questions.

## 2022-02-20 ENCOUNTER — Ambulatory Visit: Payer: BC Managed Care – PPO | Admitting: Internal Medicine

## 2022-03-18 ENCOUNTER — Other Ambulatory Visit
Admission: RE | Admit: 2022-03-18 | Discharge: 2022-03-18 | Disposition: A | Discharge status: Home / Self Care | Payer: BC Managed Care – PPO | Attending: Internal Medicine | Admitting: Internal Medicine

## 2022-03-18 DIAGNOSIS — E785 Hyperlipidemia, unspecified: Secondary | ICD-10-CM | POA: Insufficient documentation

## 2022-03-18 DIAGNOSIS — I5022 Chronic systolic (congestive) heart failure: Secondary | ICD-10-CM | POA: Diagnosis present

## 2022-03-18 DIAGNOSIS — Z79899 Other long term (current) drug therapy: Secondary | ICD-10-CM | POA: Diagnosis present

## 2022-03-18 DIAGNOSIS — E1169 Type 2 diabetes mellitus with other specified complication: Secondary | ICD-10-CM | POA: Insufficient documentation

## 2022-03-18 DIAGNOSIS — I10 Essential (primary) hypertension: Secondary | ICD-10-CM | POA: Insufficient documentation

## 2022-03-18 LAB — BASIC METABOLIC PANEL
Anion gap: 9 (ref 5–15)
BUN: 17 mg/dL (ref 8–23)
CO2: 25 mmol/L (ref 22–32)
Calcium: 9.4 mg/dL (ref 8.9–10.3)
Chloride: 105 mmol/L (ref 98–111)
Creatinine, Ser: 1.07 mg/dL (ref 0.61–1.24)
GFR, Estimated: >60 mL/min (ref >60)
Glucose, Bld: 208 mg/dL — ABNORMAL HIGH (ref 70–99)
Potassium: 3.9 mmol/L (ref 3.5–5.1)
Sodium: 139 mmol/L (ref 135–145)

## 2022-03-18 LAB — LIPID PANEL
Cholesterol: 214 mg/dL — ABNORMAL HIGH (ref 0–200)
HDL: 32 mg/dL — ABNORMAL LOW (ref >40)
LDL Cholesterol: 142 mg/dL — ABNORMAL HIGH (ref 0–99)
Total CHOL/HDL Ratio: 6.7 RATIO
Triglycerides: 199 mg/dL — ABNORMAL HIGH (ref <150)
VLDL: 40 mg/dL (ref 0–40)

## 2022-03-18 LAB — ALT: ALT: 64 U/L — ABNORMAL HIGH (ref 0–44)

## 2022-03-20 ENCOUNTER — Telehealth: Payer: Self-pay | Admitting: *Deleted

## 2022-03-20 NOTE — Telephone Encounter (Signed)
-----   Message from Yvonne Kendall, MD sent at 03/18/2022  3:02 PM EDT ----- It appears that Mr. Ellenburg lipids have worsened since April despite adding ezetimibe at that time.  His ALT is also mildly elevated again.  Please confirm that he has been taking his lipid medications (rosuvastatin, ezetimibe, and Vascepa) regularly as prescribed.  If not, he should restart them as soon as possible unless he was experiencing side effects.  If he has been compliant with his medications, I recommend that we consider switching from ezetimibe to whichever PCSK9 inhibitor (Repatha or Praluent) is on his insurance formulary.

## 2022-03-20 NOTE — Telephone Encounter (Signed)
Attempted to call pt. No answer. Lmtcb.  

## 2022-03-20 NOTE — Telephone Encounter (Signed)
Patient returned RN's call. 

## 2022-03-21 ENCOUNTER — Ambulatory Visit (INDEPENDENT_AMBULATORY_CARE_PROVIDER_SITE_OTHER): Payer: BC Managed Care – PPO

## 2022-03-21 DIAGNOSIS — I5022 Chronic systolic (congestive) heart failure: Secondary | ICD-10-CM | POA: Diagnosis not present

## 2022-03-21 DIAGNOSIS — I255 Ischemic cardiomyopathy: Secondary | ICD-10-CM

## 2022-03-21 LAB — ECHOCARDIOGRAM LIMITED
Area-P 1/2: 2.16 cm2
S' Lateral: 5.3 cm

## 2022-03-21 MED ORDER — PERFLUTREN LIPID MICROSPHERE
1.0000 mL | INTRAVENOUS | Status: AC | PRN
  Administered 2022-03-21: 2 mL via INTRAVENOUS

## 2022-03-21 NOTE — Telephone Encounter (Signed)
The patient has been notified of the result and verbalized understanding.  All questions (if any) were answered. See telephone for further details. Pt states that he has been traveling a lot recently and over the summer, and several times he has forgotten and left his medications at home. So pt will resume rosuvastatin, ezetimibe, and Vascepa as prescribed. Pt will follow up as scheduled next week 03/26/22.

## 2022-03-21 NOTE — Telephone Encounter (Signed)
Attempted to call pt. No answer. Lmtcb.  

## 2022-03-21 NOTE — Telephone Encounter (Signed)
Follow Up:    Patient is returning Megan's call from today. 

## 2022-03-26 ENCOUNTER — Ambulatory Visit: Payer: BC Managed Care – PPO | Admitting: Cardiology

## 2022-03-26 ENCOUNTER — Encounter: Payer: Self-pay | Admitting: Medical

## 2022-03-26 VITALS — BP 124/80 | HR 86 | Ht 71.0 in | Wt 271.2 lb

## 2022-03-26 DIAGNOSIS — I10 Essential (primary) hypertension: Secondary | ICD-10-CM

## 2022-03-26 DIAGNOSIS — I2510 Atherosclerotic heart disease of native coronary artery without angina pectoris: Secondary | ICD-10-CM | POA: Diagnosis not present

## 2022-03-26 DIAGNOSIS — E785 Hyperlipidemia, unspecified: Secondary | ICD-10-CM

## 2022-03-26 DIAGNOSIS — I5022 Chronic systolic (congestive) heart failure: Secondary | ICD-10-CM | POA: Diagnosis not present

## 2022-03-26 MED ORDER — CLOPIDOGREL BISULFATE 75 MG PO TABS: 75.0000 mg | ORAL_TABLET | Freq: Every day | ORAL | 0 refills | Status: AC

## 2022-03-26 MED ORDER — EZETIMIBE 10 MG PO TABS: 10.0000 mg | ORAL_TABLET | Freq: Every day | ORAL | 0 refills | Status: DC

## 2022-03-26 MED ORDER — DAPAGLIFLOZIN PROPANEDIOL 5 MG PO TABS: 5.0000 mg | ORAL_TABLET | Freq: Every day | ORAL | 0 refills | Status: AC

## 2022-03-26 MED ORDER — LOSARTAN POTASSIUM 25 MG PO TABS: 12.5000 mg | ORAL_TABLET | Freq: Every day | ORAL | 0 refills | Status: DC

## 2022-03-26 MED ORDER — CARVEDILOL 3.125 MG PO TABS
3.1250 mg | ORAL_TABLET | Freq: Two times a day (BID) | ORAL | 0 refills | Status: DC
Start: 2022-03-26 — End: 2022-06-28

## 2022-03-26 MED ORDER — ICOSAPENT ETHYL 1 G PO CAPS: 2.0000 g | ORAL_CAPSULE | Freq: Two times a day (BID) | ORAL | 0 refills | Status: DC

## 2022-03-26 MED ORDER — FUROSEMIDE 20 MG PO TABS: 20.0000 mg | ORAL_TABLET | Freq: Every day | ORAL | 0 refills | Status: DC

## 2022-03-26 MED ORDER — ROSUVASTATIN CALCIUM 40 MG PO TABS: 40.0000 mg | ORAL_TABLET | Freq: Every day | ORAL | 0 refills | Status: DC

## 2022-03-26 MED ORDER — POTASSIUM CHLORIDE CRYS ER 20 MEQ PO TBCR: 20.0000 meq | EXTENDED_RELEASE_TABLET | Freq: Every day | ORAL | 0 refills | Status: DC

## 2022-03-26 NOTE — Patient Instructions (Signed)
Medication Instructions:   Your physician recommends that you continue on your current medications as directed. Please refer to the Current Medication list given to you today.  *If you need a refill on your cardiac medications before your next appointment, please call your pharmacy*   Lab Work:  LIPID PANEL in ~ 10 weeks You will need to be fasting for this lab draw. Please go to the Medical Mall and check in at the registration desk.  Monday-Friday 7:00 am- 6:00 pm, No appointment needed  If you have labs (blood work) drawn today and your tests are completely normal, you will receive your results only by: MyChart Message (if you have MyChart) OR A paper copy in the mail If you have any lab test that is abnormal or we need to change your treatment, we will call you to review the results.   Testing/Procedures: None ordered   Follow-Up: At [facility], you and your health needs are our priority.  As part of our continuing mission to provide you with exceptional heart care, we have created designated Provider Care Teams.  These Care Teams include your primary Cardiologist (physician) and Advanced Practice Providers (APPs -  Physician Assistants and Nurse Practitioners) who all work together to provide you with the care you need, when you need it.  We recommend signing up for the patient portal called "MyChart".  Sign up information is provided on this After Visit Summary.  MyChart is used to connect with patients for Virtual Visits (Telemedicine).  Patients are able to view lab/test results, encounter notes, upcoming appointments, etc.  Non-urgent messages can be sent to your provider as well.   To learn more about what you can do with MyChart, go to [email or web address].    Your next appointment:   3 month(s)  The format for your next appointment:   In Person  Provider:   You may see Yvonne Kendall, MD or one of the following Advanced Practice Providers on your designated  Care Team:   Nicolasa Ducking, NP Eula Listen, PA-C Cadence Fransico Michael, [place]   Other Instructions N/A  Important Information About Sugar

## 2022-03-26 NOTE — Progress Notes (Signed)
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  Past Medical History    Past Medical History:  Diagnosis Date   Coronary artery disease    Diabetes mellitus without complication (HCC)    Dyslipidemia (high LDL; low HDL)    Family history of premature CAD    Headache    History of kidney stones    Low HDL (under 40)    MI (myocardial infarction) (HCC) 2016   PONV (postoperative nausea and vomiting)    as a child eye surgery   Past Surgical History:  Procedure Laterality Date   CORONARY ARTERY BYPASS GRAFT N/A 11/15/2014   Procedure: CORONARY ARTERY BYPASS GRAFTING (CABG), ON PUMP, TIMES THREE, USING LEFT INTERNAL MAMMARY ARTERY, RIGHT GREATER SAPHENOUS VEIN HARVESTED ENDOSCOPICALLY;  Surgeon: Seth Slot, MD;  Location: [facility] OR;  Service: Open Heart Surgery;  Laterality: N/A;   CYSTOSCOPY W/ URETERAL STENT PLACEMENT Right 02/04/2017   Procedure: CYSTOSCOPY WITH STENT REPLACEMENT;  Surgeon: Seth Scotland, MD;  Location: [facility] ORS;  Service: Urology;  Laterality: Right;   CYSTOSCOPY W/ URETERAL STENT REMOVAL Left 02/04/2017   Procedure: CYSTOSCOPY WITH STENT REMOVAL;  Surgeon: Seth Scotland, MD;  Location: [facility] ORS;  Service: Urology;  Laterality: Left;   CYSTOSCOPY WITH STENT PLACEMENT Bilateral 01/17/2017   Procedure: CYSTOSCOPY WITH STENT PLACEMENT;  Surgeon: Seth Scotland, MD;  Location: [facility] ORS;  Service: Urology;  Laterality: Bilateral;   EYE SURGERY     multiple eye surgeries as a child   INTRA-AORTIC BALLOON PUMP INSERTION N/A 11/15/2014   Procedure: INTRA-AORTIC BALLOON PUMP INSERTION;  Surgeon: Seth Bihari, MD;  Location: [facility] CATH LAB;  Service:  Cardiovascular;  Laterality: N/A;   LEFT HEART CATH AND CORONARY ANGIOGRAPHY N/A 07/10/2021   Procedure: LEFT HEART CATH AND CORONARY ANGIOGRAPHY;  Surgeon: Seth Kendall, MD;  Location: [facility] INVASIVE CV LAB;  Service: Cardiovascular;  Laterality: N/A;   LEFT HEART CATHETERIZATION WITH CORONARY ANGIOGRAM N/A 11/14/2014   Procedure: LEFT HEART CATHETERIZATION WITH CORONARY ANGIOGRAM;  Surgeon: Seth Bihari, MD;  Location: [facility] CATH LAB;  Service: Cardiovascular;  Laterality: N/A;   PERCUTANEOUS CORONARY STENT INTERVENTION (PCI-S) N/A 11/15/2014   Procedure: PERCUTANEOUS CORONARY STENT INTERVENTION (PCI-S);  Surgeon: Seth Bihari, MD;  Location: [facility] CATH LAB;  Service: Cardiovascular;  Laterality: N/A;   STOMACH SURGERY     at 66 weeks old   TEE WITHOUT CARDIOVERSION N/A 11/15/2014   Procedure: TRANSESOPHAGEAL ECHOCARDIOGRAM (TEE);  Surgeon: Seth Slot, MD;  Location: [facility] OR;  Service: Open Heart Surgery;  Laterality: N/A;   URETEROSCOPY WITH HOLMIUM LASER LITHOTRIPSY Bilateral 01/17/2017   Procedure: URETEROSCOPY WITH HOLMIUM LASER LITHOTRIPSY;  Surgeon: Seth Scotland, MD;  Location: [facility] ORS;  Service: Urology;  Laterality: Bilateral;   URETEROSCOPY WITH HOLMIUM LASER LITHOTRIPSY Right 02/04/2017   Procedure: URETEROSCOPY WITH HOLMIUM LASER LITHOTRIPSY;  Surgeon: Seth Scotland, MD;  Location: [facility] ORS;  Service: Urology;  Laterality: Right;    Allergies  Allergies  Allergen Reactions   Coconut (Cocos Nucifera) Hives    History of Present Illness    Seth Riddle is a 61 year old male with a history of CAD status post CABG (LIMA-LAD, SVG-D1, and  SVG-OM2) and subsequent catheterization in 02/2020 at [facility] demonstrating chronically occluded SVG-D1 any acutely occluded SVG-OM1 treated with DES, chronic heart failure with reduced ejection fraction due to ischemic cardiomyopathy, hyperlipidemia, and type 2 diabetes.   He was evaluated in 07/10/2021 for  NSTEMI and subsequently underwent left heart catheterization which revealed severe two-vessel coronary artery disease with chronic total occlusions of the mid LAD and OM 2, mild to moderate nonobstructive RCA disease, widely patent LIMA to the LAD, chronically occluded SVG-D1, acute/subacute thrombotic occlusion of SVG-OM 2 with heavy thrombus burden, severely reduced LVEF of 25 to 35% with moderately elevated filling pressure.  The next day he underwent echocardiogram which revealed left ventricular ejection fraction 30 to 35%, the left ventricle demonstrated regional wall motion abnormalities, left ventricular internal cavity size was mildly dilated, G1 DD, without valvular abnormalities.  He is recently underwent echocardiogram 04/07/2022 which revealed LVEF at 30 to 35%.  Left ventricle is moderately decreased function, global hypokinesis, left ventricular internal cavity size is mildly dilated, left ventricular diastolic parameters are consistent with G1 DD, right ventricular systolic function is normal, and right ventricular size is normal.  As he returns to clinic today stating that he feels fairly well. Denies any chest pain or shortness of breath. Unfortunately he has not been taking his medications as prescribed. He was off of all of his medications for roughly two months and has restarted all his medications in and off and on capacity for the last month. He stated that he had some family problems and had to leave to town then was traveling for work. He also denies any hospitalizations or visits to the emergency department.  Home Medications    Current Outpatient Medications  Medication Sig Dispense Refill   aspirin EC 81 MG tablet Take 1 tablet (81 mg total) by mouth daily. 90 tablet 3   carvedilol (COREG) 3.125 MG tablet Take 1 tablet (3.125 mg total) by mouth 2 (two) times daily with a meal. 180 tablet 0   clopidogrel (PLAVIX) 75 MG tablet Take 1 tablet (75 mg total) by mouth daily. 90 tablet  0   dapagliflozin propanediol (FARXIGA) 5 MG TABS tablet Take 1 tablet (5 mg total) by mouth daily before breakfast. 90 tablet 0   ezetimibe (ZETIA) 10 MG tablet Take 1 tablet (10 mg total) by mouth daily. 90 tablet 0   furosemide (LASIX) 20 MG tablet Take 1 tablet (20 mg total) by mouth daily. 90 tablet 0   icosapent Ethyl (VASCEPA) 1 g capsule Take 2 capsules (2 g total) by mouth 2 (two) times daily. 360 capsule 0   losartan (COZAAR) 25 MG tablet Take 0.5 tablets (12.5 mg total) by mouth daily. 45 tablet 0   potassium chloride SA (KLOR-CON M) 20 MEQ tablet Take 1 tablet (20 mEq total) by mouth daily. 90 tablet 0   rosuvastatin (CRESTOR) 40 MG tablet Take 1 tablet (40 mg total) by mouth daily. 90 tablet 0   traZODone (DESYREL) 50 MG tablet Take 0.5 tablets (25 mg total) by mouth at bedtime as needed for sleep. (Patient not taking: Reported on 03/26/2022) 10 tablet 0   No current facility-administered medications for this visit.     Family History    Family History  Problem Relation Age of Onset   Heart disease Mother    Stroke Mother    Nephrolithiasis Mother    Heart attack Father    Stroke Father    Kidney cancer Neg Hx  Bladder Cancer Neg Hx    Prostate cancer Neg Hx    He indicated that his mother is alive. He indicated that his father is alive. He indicated that his maternal grandmother is deceased. He indicated that his maternal grandfather is deceased. He indicated that his paternal grandmother is deceased. He indicated that his paternal grandfather is deceased. He indicated that the status of his neg hx is unknown.  Social History    Social History   Socioeconomic History   Marital status: Married    Spouse name: Not on file   Number of children: Not on file   Years of education: Not on file   Highest education level: Not on file  Occupational History   Not on file  Tobacco Use   Smoking status: Never   Smokeless tobacco: Never  Vaping Use   Vaping Use: Never used   Substance and Sexual Activity   Alcohol use: Yes    Comment: 1 glass of wine once a year   Drug use: No   Sexual activity: Not on file  Other Topics Concern   Not on file  Social History Narrative   Not on file   Social Determinants of Health   Financial Resource Strain: Not on file  Food Insecurity: Not on file  Transportation Needs: Not on file  Physical Activity: Not on file  Stress: Not on file  Social Connections: Not on file  Intimate Partner Violence: Not on file     Review of Systems    General:  No chills, fever, night sweats or weight changes.  Cardiovascular:  No chest pain, dyspnea on exertion, edema, orthopnea, palpitations, paroxysmal nocturnal dyspnea. Dermatological: No rash, lesions/masses Respiratory: No cough, dyspnea Urologic: No hematuria, dysuria Abdominal:   No nausea, vomiting, diarrhea, bright red blood per rectum, melena, or hematemesis Neurologic:  No visual changes, wkns, changes in mental status. Endorses numbness and pain down his bilateral lower extremities starting from his lower back All other systems reviewed and are otherwise negative except as noted above.     Physical Exam    VS:  BP 124/80 (BP Location: Left Arm, Patient Position: Sitting, Cuff Size: Normal)   Pulse 86   Ht 5\' 11"  (1.803 m)   Wt 271 lb 4 oz (123 kg)   SpO2 98%   BMI 37.83 kg/m  , BMI Body mass index is 37.83 kg/m.     GEN: Well nourished, well developed, in no acute distress. HEENT: normal. Neck: Supple, no JVD, carotid bruits, or masses. Cardiac: RRR, no murmurs, rubs, or gallops. No clubbing, cyanosis, edema.  Radials/DP/PT 2+ and equal bilaterally.  Respiratory:  Respirations regular and unlabored, clear to auscultation bilaterally. GI: Soft, nontender, nondistended, BS + x 4. MS: no deformity or atrophy. Skin: warm and dry, no rash. Neuro:  Strength and sensation are intact. Psych: Normal affect.  Accessory Clinical Findings    ECG personally  reviewed by me today- No new tracings were completed today.  Lab Results  Component Value Date   WBC 8.5 07/11/2021   HGB 15.1 07/11/2021   HCT 43.7 07/11/2021   MCV 88.8 07/11/2021   PLT 161 07/11/2021   Lab Results  Component Value Date   CREATININE 1.07 03/18/2022   BUN 17 03/18/2022   NA 139 03/18/2022   K 3.9 03/18/2022   CL 105 03/18/2022   CO2 25 03/18/2022   Lab Results  Component Value Date   ALT 64 (H) 03/18/2022   AST 48 (H)  08/24/2021   ALKPHOS 103 08/24/2021   BILITOT 1.1 08/24/2021   Lab Results  Component Value Date   CHOL 214 (H) 03/18/2022   HDL 32 (L) 03/18/2022   LDLCALC 142 (H) 03/18/2022   TRIG 199 (H) 03/18/2022   CHOLHDL 6.7 03/18/2022    Lab Results  Component Value Date   HGBA1C 7.5 (H) 07/09/2021    Assessment & Plan   1.  Coronary artery disease without anginal symptoms. He has recently restarted his medication of aspirin and Plavix.  Recommended that he continue dual antiplatelet therapy with aspirin and Plavix for at least 12 months from his most recent NSTEMI which was 06/2021.  He is also to continue on his current lipid regimen with ezetimibe 10 mg daily and Vascepa 2 g twice daily and rosuvastatin 40 mg daily.  He also requires refills of all of his medications to be sent to the [facility] on [address] for 90 days.  2.  Chronic HFrEF due to ischemic cardiomyopathy as he appears euvolemic on exam today.  He does occasionally suffer from NYHA class I-II symptoms.  Escalation of GDMT has been limited due to soft blood pressures in the past.  He is to continue on carvedilol, Farxiga, furosemide, and  losartan.  No changes to medications today as he has just recently started retaking his medications.He did undergo limited echocardiogram which revealed LVEF 30 to 35%, global hypokinesis, the left ventricular internal cavity size is mildly dilated, G1 DD, which is unchanged from previous study of 07/11/2021.  Did have extensive discussion with the  patient the importance of this medication, side effects on this medication, and benefits of these medications.  We also discussed potentially sending him for EP evaluation for consideration of ICD placement but will hold off at this time due to medication nonadherence.  3.  Hyperlipidemia with his last LDL being 142 on 03/18/2022.  The patient has been restarted on Vascepa, ezetimibe,  and Crestor.  Been encouraged to go back to his diet modification and increasing his activity.  Will repeat a lipid panel in 10 weeks.  4.  Hypertension with blood pressure today 124/80.  He was just restarted to take all of his medications as prescribed so we will hold off on any escalation of medication at this time.  When discussing if patient had any symptoms with increased dose of losartan to 25 mg daily pressure 12.5 mg daily he stated he was unaware of any at this time.  If he continues to maintain blood pressures like today will consider escalating losartan and carvedilol dosing.  5. Disposition return to see MD/APP in 3 months or sooner if needed.   Quinley Nesler, NP 03/26/2022, 3:09 PM

## 2022-06-26 ENCOUNTER — Ambulatory Visit: Payer: BC Managed Care – PPO | Admitting: Cardiology

## 2022-06-27 NOTE — Progress Notes (Signed)
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  Past Medical History    Past Medical History:  Diagnosis Date   Coronary artery disease    Diabetes mellitus without complication (HCC)    Dyslipidemia (high LDL; low HDL)    Family history of premature CAD    Headache    History of kidney stones    Low HDL (under 40)    MI (myocardial infarction) ([place]) 2016   PONV (postoperative nausea and vomiting)    as a child eye surgery   Past Surgical History:  Procedure Laterality Date   CORONARY ARTERY BYPASS GRAFT N/A 11/15/2014   Procedure: CORONARY ARTERY BYPASS GRAFTING (CABG), ON PUMP, TIMES THREE, USING LEFT INTERNAL MAMMARY ARTERY, RIGHT GREATER SAPHENOUS VEIN HARVESTED ENDOSCOPICALLY;  Surgeon: Melrose Nakayama, MD;  Location: [place];  Service: Open Heart Surgery;  Laterality: N/A;   CYSTOSCOPY W/ URETERAL STENT PLACEMENT Right 02/04/2017   Procedure: CYSTOSCOPY WITH STENT REPLACEMENT;  Surgeon: Hollice Espy, MD;  Location: [facility];  Service: Urology;  Laterality: Right;   CYSTOSCOPY W/ URETERAL STENT REMOVAL Left 02/04/2017   Procedure: CYSTOSCOPY WITH STENT REMOVAL;  Surgeon: Hollice Espy, MD;  Location: [facility];  Service: Urology;  Laterality: Left;   CYSTOSCOPY WITH STENT PLACEMENT Bilateral 01/17/2017   Procedure: CYSTOSCOPY WITH STENT PLACEMENT;  Surgeon: Hollice Espy, MD;  Location: [facility];  Service: Urology;  Laterality: Bilateral;   EYE SURGERY     multiple eye surgeries as a child   INTRA-AORTIC BALLOON PUMP INSERTION N/A 11/15/2014   Procedure: INTRA-AORTIC BALLOON PUMP INSERTION;  Surgeon: Troy Sine, MD;  [facility]: [facility] C[facility];  Service: Cardiovascular;  Laterality: N/A;   LEFT HEART CATH AND CORONARY ANGIOGRAPHY N/A 07/10/2021   Procedure: LEFT HEART CATH AND CORONARY ANGIOGRAPHY;  Surgeon: Nelva Bush, MD;  Location: [place] [facility];  Service: Cardiovascular;  Laterality: N/A;   LEFT HEART CATHETERIZATION WITH CORONARY ANGIOGRAM N/A 11/14/2014   Procedure: LEFT HEART CATHETERIZATION WITH CORONARY ANGIOGRAM;  Surgeon: Troy Sine, MD;  Location: [facility] C[facility] LAB;  Service: Cardiovascular;  Laterality: N/A;   PERCUTANEOUS CORONARY STENT INTERVENTION (PCI-S) N/A 11/15/2014   Procedure: PERCUTANEOUS CORONARY STENT INTERVENTION (PCI-S);  Surgeon: Troy Sine, MD;  Location: [facility] CATH LAB;  Service: Cardiovascular;  Laterality: N/A;   STOMACH SURGERY     at 56 weeks old   TEE WITHOUT CARDIOVERSION N/A 11/15/2014   Procedure: TRANSESOPHAGEAL ECHOCARDIOGRAM (TEE);  Surgeon: Melrose Nakayama, MD;  Location: [place];  Service: Open Heart Surgery;  Laterality: N/A;   URETEROSCOPY WITH HOLMIUM LASER LITHOTRIPSY Bilateral 01/17/2017   Procedure: URETEROSCOPY WITH HOLMIUM LASER LITHOTRIPSY;  Surgeon: Hollice Espy, MD;  Location: [facility];  Service: Urology;  Laterality: Bilateral;   URETEROSCOPY WITH HOLMIUM LASER LITHOTRIPSY Right 02/04/2017   Procedure: URETEROSCOPY WITH HOLMIUM LASER LITHOTRIPSY;  Surgeon: Hollice Espy, MD;  Location: [facility];  Service: Urology;  Laterality: Right;    Allergies  Allergies  Allergen Reactions   Coconut (Cocos Nucifera) Hives    History of Present Illness    Seth Riddle is a 61 year old male with a history of coronary artery  disease status post CABG (LIMA-LAD, SVG-D1, and SVG-OM2) and subsequent catheterization in 02/2020 at [facility] demonstrating chronically occluded SVG-D1 and acutely occluded SVG-OM1 treated with DES, chronic heart failure with reduced ejection fraction due to ischemic cardiomyopathy,  hyperlipidemia, and type 2 diabetes.  He was evaluated in 07/10/2021 for NSTEMI and subsequently underwent left heart catheterization which revealed severe two-vessel coronary artery disease with chronic total occlusion of the mid LAD and OM 2, mild to moderate nonobstructive RCA disease, widely patent LIMA-LAD, chronically occluded SVG-D1, acute/subacute thrombotic occlusion of SVG-OM 2 with heavy thrombus burden, severely reduced LVEF of 25-35% with moderately elevated filling pressure.  Echocardiogram revealed LVEF 30-35%, the left ventricle demonstrated regional wall motion abnormalities, left ventricle internal cavity size was mildly dilated, G1 DD, without valvular abnormalities.  Echocardiogram completed 04/07/2022 revealed LVEF of 30-35%, left ventricle is moderately decreased function, global hypokinesis, G1 DD, without valvular abnormalities.  He was last seen in clinic 03/26/2022 stating he was feeling fairly well.  Denied chest pain or shortness of breath.  Unfortunately he was found not to have been taking his medications as previously prescribed.  Result discussed if he continues with medication compliance potentially would need to follow-up with EP for evaluation of ICD placement.  He returns to clinic today stating that he has been doing fairly well.  He does continue to have exertional fatigue.  Has occasional shortness of breath but denies any chest pain or chest pressure.  He continues to suffer from numbness and discomfort to his bilateral lower extremities from his hips to his thighs stating that he has had this discomfort since he had his open heart surgery.  He denies any hospitalizations or visits to the emergency department.  Home Medications    Current Outpatient Medications  Medication Sig Dispense Refill   sacubitril-valsartan (ENTRESTO) 24-26 MG Take 1 tablet by mouth 2 (two) times daily. 60 tablet 6   aspirin EC 81 MG tablet Take 1 tablet (81 mg total) by mouth daily. 90  tablet 3   carvedilol (COREG) 3.125 MG tablet Take 1 tablet (3.125 mg total) by mouth 2 (two) times daily with a meal. 180 tablet 1   ezetimibe (ZETIA) 10 MG tablet Take 1 tablet (10 mg total) by mouth daily. 90 tablet 1   furosemide (LASIX) 20 MG tablet Take 1 tablet (20 mg total) by mouth daily. 90 tablet 1   icosapent Ethyl (VASCEPA) 1 g capsule Take 2 capsules (2 g total) by mouth 2 (two) times daily. 360 capsule 1   potassium chloride SA (KLOR-CON M) 20 MEQ tablet Take 1 tablet (20 mEq total) by mouth daily. 90 tablet 1   rosuvastatin (CRESTOR) 40 MG tablet Take 1 tablet (40 mg total) by mouth daily. 90 tablet 1   traZODone (DESYREL) 50 MG tablet Take 0.5 tablets (25 mg total) by mouth at bedtime as needed for sleep. (Patient not taking: Reported on 03/26/2022) 10 tablet 0   No current facility-administered medications for this visit.     Family History    Family History  Problem Relation Age of Onset   Heart disease Mother    Stroke Mother    Nephrolithiasis Mother    Heart attack Father    Stroke Father    Kidney cancer Neg Hx    Bladder Cancer Neg Hx    Prostate cancer Neg Hx    He indicated that his mother is alive. He indicated that his father is alive. He indicated that his  maternal grandmother is deceased. He indicated that his maternal grandfather is deceased. He indicated that his paternal grandmother is deceased. He indicated that his paternal grandfather is deceased. He indicated that the status of his neg hx is unknown.  Social History    Social History   Socioeconomic History   Marital status: Married    Spouse name: Not on file   Number of children: Not on file   Years of education: Not on file   Highest education level: Not on file  Occupational History   Not on file  Tobacco Use   Smoking status: Never   Smokeless tobacco: Never  Vaping Use   Vaping Use: Never used  Substance and Sexual Activity   Alcohol use: Yes    Comment: 1 glass of wine once a  year   Drug use: No   Sexual activity: Not on file  Other Topics Concern   Not on file  Social History Narrative   Not on file   Social Determinants of Health   Financial Resource Strain: Not on file  Food Insecurity: Not on file  Transportation Needs: Not on file  Physical Activity: Not on file  Stress: Not on file  Social Connections: Not on file  Intimate Partner Violence: Not on file     Review of Systems    General:  No chills, fever, night sweats or weight changes.  Endorses fatigue Cardiovascular:  No chest pain, endorses dyspnea on exertion, edema, orthopnea, palpitations, paroxysmal nocturnal dyspnea. Dermatological: No rash, lesions/masses Respiratory: No cough, endorses dyspnea Urologic: No hematuria, dysuria Musculoskeletal: Endorses pain to his bilateral thighs at rest and on ambulation Abdominal:   No nausea, vomiting, diarrhea, bright red blood per rectum, melena, or hematemesis Neurologic:  No visual changes, wkns, changes in mental status. All other systems reviewed and are otherwise negative except as noted above.   Physical Exam    VS:  BP 128/88 (BP Location: Left Arm, Patient Position: Sitting, Cuff Size: Large)   Pulse 84   Ht _0  (1.803 m)   Wt 266 lb (120.7 kg)   SpO2 97%   BMI 37.10 kg/m  , BMI Body mass index is 37.1 kg/m.     GEN: Well nourished, well developed, in no acute distress. HEENT: normal. Neck: Supple, no JVD, carotid bruits, or masses. Cardiac: RRR, no murmurs, rubs, or gallops. No clubbing, cyanosis, trace pretibial edema.  Radials/DP/PT 2+ and equal bilaterally.  Respiratory:  Respirations regular and unlabored, clear to auscultation bilaterally. GI: Soft, nontender, nondistended, BS + x 4. MS: no deformity or atrophy. Skin: warm and dry, no rash. Neuro:  Strength and sensation are intact. Psych: Normal affect.  Accessory Clinical Findings    ECG personally reviewed by me today-sinus rhythm with a rate of 84, T wave  inversion noted in V6- No acute changes  Lab Results  Component Value Date   WBC 8.5 07/11/2021   HGB 15.1 07/11/2021   HCT 43.7 07/11/2021   MCV 88.8 07/11/2021   PLT 161 07/11/2021   Lab Results  Component Value Date   CREATININE 1.07 03/18/2022   BUN 17 03/18/2022   NA 139 03/18/2022   K 3.9 03/18/2022   CL 105 03/18/2022   CO2 25 03/18/2022   Lab Results  Component Value Date   ALT 64 (H) 03/18/2022   AST 48 (H) 08/24/2021   ALKPHOS 103 08/24/2021   BILITOT 1.1 08/24/2021   Lab Results  Component Value Date   CHOL 214 (  H) 03/18/2022   HDL 32 (L) 03/18/2022   LDLCALC 142 (H) 03/18/2022   TRIG 199 (H) 03/18/2022   CHOLHDL 6.7 03/18/2022    Lab Results  Component Value Date   HGBA1C 7.5 (H) 07/09/2021    Assessment & Plan   1.  Coronary artery disease without anginal symptoms.  He is continued on aspirin 81 mg daily.  He has met his 2-year mark for his clopidogrel and has subsequently stopped taking clopidogrel.  He is continued on ezetimibe, Vascepa, rosuvastatin.  He is also being sent for a repeat lipid panel today as he has yet to have that blood drawn from his previous appointment.  EKG today shows a sinus rhythm with a rate of 84 with T wave inversions in V6.  2.  Chronic HFrEF due to ischemic cardiomyopathy.  He does occasionally suffer from NYHA class II symptoms.  Escalation of GDMT has been limited due to soft blood pressures in the past.  Of note today he is no longer taking [place].  He is continued on Coreg 3.125 mg twice daily, Lasix 20 mg daily, losartan 12.5 mg daily.  With blood pressure being upwards of 128/86 today we are discontinuing his losartan and starting him on Entresto 24/26 mg twice daily.  During his last visit there was a question of medication nonadherence.  Since then he stated that he has been taking his medications as prescribed without side effects.  He had a limited echocardiogram which revealed an LVEF of 30-35%, global hypokinesia  kinesis, and left ventricular internal cavity size is mildly dilated, G1 DD, which was unchanged from previous study done 07/11/2021.  With starting him on Entresto today we will reevaluate with limited echocardiogram in 3 months and make a determination whether to send him to EP if he continues with medication adherence.  He will need a BMP in 2 weeks for being placed on Entresto and was also educated on side effects of low blood pressures.  3.  Hyperlipidemia with his last LDL being 142 on 03/08/2022.  He was restarted on Vascepa, ezetimibe and Crestor.  He has been sent for a lipid panel since being back on his medications.  4.  Hypertension with blood pressure today 128/88.  He is continued on Coreg 3.125 mg twice daily, Lasix 20 mg daily, and losartan has been discontinued and replaced with Entresto 24 to 26 mg twice daily.  He has been encouraged to monitor his pressure at home as well.  5.  Disposition patient return to clinic to see MD/APP in 1 month or sooner if needed after discontinuing losartan and starting on Entresto.  Will need to follow-up on ordered blood work as well and determine medication compliance.  Arilyn Brierley, NP 06/28/2022, 9:07 AM

## 2022-06-28 ENCOUNTER — Other Ambulatory Visit
Admission: RE | Admit: 2022-06-28 | Discharge: 2022-06-28 | Disposition: A | Discharge status: Home / Self Care | Payer: BC Managed Care – PPO | Source: Ambulatory Visit | Attending: Cardiology | Admitting: Cardiology

## 2022-06-28 ENCOUNTER — Encounter: Payer: Self-pay | Admitting: Cardiology

## 2022-06-28 ENCOUNTER — Telehealth: Payer: Self-pay

## 2022-06-28 ENCOUNTER — Ambulatory Visit: Payer: BC Managed Care – PPO | Attending: Cardiology | Admitting: Cardiology

## 2022-06-28 VITALS — BP 128/88 | HR 84 | Ht 71.0 in | Wt 266.0 lb

## 2022-06-28 DIAGNOSIS — E785 Hyperlipidemia, unspecified: Secondary | ICD-10-CM

## 2022-06-28 DIAGNOSIS — I2510 Atherosclerotic heart disease of native coronary artery without angina pectoris: Secondary | ICD-10-CM

## 2022-06-28 DIAGNOSIS — I5022 Chronic systolic (congestive) heart failure: Secondary | ICD-10-CM

## 2022-06-28 DIAGNOSIS — I10 Essential (primary) hypertension: Secondary | ICD-10-CM

## 2022-06-28 LAB — LIPID PANEL
Cholesterol: 125 mg/dL (ref 0–200)
HDL: 25 mg/dL — ABNORMAL LOW (ref >40)
LDL Cholesterol: 70 mg/dL (ref 0–99)
Total CHOL/HDL Ratio: 5 RATIO
Triglycerides: 148 mg/dL (ref <150)
VLDL: 30 mg/dL (ref 0–40)

## 2022-06-28 MED ORDER — ICOSAPENT ETHYL 1 G PO CAPS: 2.0000 g | ORAL_CAPSULE | Freq: Two times a day (BID) | ORAL | 1 refills | Status: DC

## 2022-06-28 MED ORDER — ROSUVASTATIN CALCIUM 40 MG PO TABS: 40.0000 mg | ORAL_TABLET | Freq: Every day | ORAL | 1 refills | Status: DC

## 2022-06-28 MED ORDER — CARVEDILOL 3.125 MG PO TABS: 3.1250 mg | ORAL_TABLET | Freq: Two times a day (BID) | ORAL | 1 refills | Status: DC

## 2022-06-28 MED ORDER — EZETIMIBE 10 MG PO TABS: 10.0000 mg | ORAL_TABLET | Freq: Every day | ORAL | 1 refills | Status: DC

## 2022-06-28 MED ORDER — FUROSEMIDE 20 MG PO TABS: 20.0000 mg | ORAL_TABLET | Freq: Every day | ORAL | 1 refills | Status: DC

## 2022-06-28 MED ORDER — ENTRESTO 24-26 MG PO TABS: 1.0000 | ORAL_TABLET | Freq: Two times a day (BID) | ORAL | 6 refills | Status: AC

## 2022-06-28 MED ORDER — POTASSIUM CHLORIDE CRYS ER 20 MEQ PO TBCR: 20.0000 meq | EXTENDED_RELEASE_TABLET | Freq: Every day | ORAL | 1 refills | Status: DC

## 2022-06-28 NOTE — Telephone Encounter (Signed)
Received a fax from [facility] [employer] stating that the request for coverage of Entresto (sacubitril-valsartan) has been approved from 06/28/2022-06/29/2023.

## 2022-06-28 NOTE — Telephone Encounter (Signed)
Prior Authorization initiated by [facility] for Entresto 24-26 mg tablets.  KEY: [id number] Response: Your information has been submitted to [employer]. To check for an updated outcome later, reopen this PA request from your dashboard.  If [employer] has not responded to your request within 24 hours, contact [employer] at [phone].

## 2022-06-28 NOTE — Patient Instructions (Addendum)
Medication Instructions:  -  Your physician has recommended you make the following change in your medication:   1) STOP Cozaar (Losartan)  2) START Entresto 24/26 mg: - take 1 tablet by mouth twice daily   Samples Given: Entresto 24/26 mg Lot: [id number] # 1 bottle given  *If you need a refill on your cardiac medications before your next appointment, please call your pharmacy*   Lab Work: - Your physician recommends that you have lab work today:   Fish farm manager at [facility] 1st desk on the right to check in (REGISTRATION)  Lab hours: Monday- Friday (7:30 am- 5:30 pm)   If you have labs (blood work) drawn today and your tests are completely normal, you will receive your results only by: MyChart Message (if you have MyChart) OR A paper copy in the mail If you have any lab test that is abnormal or we need to change your treatment, we will call you to review the results.   Testing/Procedures:  1) Limited Echocardiogram: in 3 months (February 2024) - Your physician has requested that you have an echocardiogram. Echocardiography is a painless test that uses sound waves to create images of your heart. It provides your doctor with information about the size and shape of your heart and how well your heart's chambers and valves are working. This procedure takes approximately one hour. There are no restrictions for this procedure. There is a possibility that an IV may need to be started during your test to inject an image enhancing agent. This is done to obtain more optimal pictures of your heart. Therefore we ask that you do at least drink some water prior to coming in to hydrate your veins.   Please do NOT wear cologne, perfume, aftershave, or lotions (deodorant is allowed). Please arrive 15 minutes prior to your appointment time.    Follow-Up: At [facility], you and your health needs are our priority.  As part of our continuing mission to provide you with  exceptional heart care, we have created designated Provider Care Teams.  These Care Teams include your primary Cardiologist (physician) and Advanced Practice Providers (APPs -  Physician Assistants and Nurse Practitioners) who all work together to provide you with the care you need, when you need it.  We recommend signing up for the patient portal called "MyChart".  Sign up information is provided on this After Visit Summary.  MyChart is used to connect with patients for Virtual Visits (Telemedicine).  Patients are able to view lab/test results, encounter notes, upcoming appointments, etc.  Non-urgent messages can be sent to your provider as well.   To learn more about what you can do with MyChart, go to [email or web address].    Your next appointment:   1 month(s)  The format for your next appointment:   In Person  Provider:   You may see Chrisptopher End, MD or one of the following Advanced Practice Providers on your designated Care Team:    Charlsie Quest, NP    Other Instructions  Echocardiogram An echocardiogram is a test that uses sound waves (ultrasound) to produce images of the heart. Images from an echocardiogram can provide important information about: Heart size and shape. The size and thickness and movement of your heart's walls. Heart muscle function and strength. Heart valve function or if you have stenosis. Stenosis is when the heart valves are too narrow. If blood is flowing backward through the heart valves (regurgitation). A tumor or infectious growth around the  heart valves. Areas of heart muscle that are not working well because of poor blood flow or injury from a heart attack. Aneurysm detection. An aneurysm is a weak or damaged part of an artery wall. The wall bulges out from the normal force of blood pumping through the body. Tell a health care provider about: Any allergies you have. All medicines you are taking, including vitamins, herbs, eye drops, creams, and  over-the-counter medicines. Any blood disorders you have. Any surgeries you have had. Any medical conditions you have. Whether you are pregnant or may be pregnant. What are the risks? Generally, this is a safe test. However, problems may occur, including an allergic reaction to dye (contrast) that may be used during the test. What happens before the test? No specific preparation is needed. You may eat and drink normally. What happens during the test?  You will take off your clothes from the waist up and put on a hospital gown. Electrodes or electrocardiogram (ECG)patches may be placed on your chest. The electrodes or patches are then connected to a device that monitors your heart rate and rhythm. You will lie down on a table for an ultrasound exam. A gel will be applied to your chest to help sound waves pass through your skin. A handheld device, called a transducer, will be pressed against your chest and moved over your heart. The transducer produces sound waves that travel to your heart and bounce back (or "echo" back) to the transducer. These sound waves will be captured in real-time and changed into images of your heart that can be viewed on a video monitor. The images will be recorded on a computer and reviewed by your health care provider. You may be asked to change positions or hold your breath for a short time. This makes it easier to get different views or better views of your heart. In some cases, you may receive contrast through an IV in one of your veins. This can improve the quality of the pictures from your heart. The procedure may vary among health care providers and hospitals. What can I expect after the test? You may return to your normal, everyday life, including diet, activities, and medicines, unless your health care provider tells you not to do that. Follow these instructions at home: It is up to you to get the results of your test. Ask your health care provider, or the  department that is doing the test, when your results will be ready. Keep all follow-up visits. This is important. Summary An echocardiogram is a test that uses sound waves (ultrasound) to produce images of the heart. Images from an echocardiogram can provide important information about the size and shape of your heart, heart muscle function, heart valve function, and other possible heart problems. You do not need to do anything to prepare before this test. You may eat and drink normally. After the echocardiogram is completed, you may return to your normal, everyday life, unless your health care provider tells you not to do that. This information is not intended to replace advice given to you by your health care provider. Make sure you discuss any questions you have with your health care provider. Document Revised: 04/18/2021 Document Reviewed: 03/28/2020 Elsevier Patient Education  2023 Elsevier Inc[employer]   Important Information About Sugar

## 2022-06-28 NOTE — Progress Notes (Signed)
Great improvement since being back on his medication. Continue current regimen.

## 2022-07-06 ENCOUNTER — Other Ambulatory Visit: Payer: Self-pay | Admitting: Cardiology

## 2022-07-30 ENCOUNTER — Ambulatory Visit: Payer: BC Managed Care – PPO | Admitting: Cardiology

## 2022-08-20 ENCOUNTER — Encounter: Payer: Self-pay | Admitting: Cardiology

## 2022-08-20 ENCOUNTER — Ambulatory Visit: Payer: BC Managed Care – PPO | Attending: Cardiology | Admitting: Cardiology

## 2022-08-20 VITALS — BP 130/89 | HR 79 | Ht 71.0 in | Wt 265.6 lb

## 2022-08-20 DIAGNOSIS — I5022 Chronic systolic (congestive) heart failure: Secondary | ICD-10-CM | POA: Diagnosis not present

## 2022-08-20 DIAGNOSIS — R002 Palpitations: Secondary | ICD-10-CM

## 2022-08-20 DIAGNOSIS — E785 Hyperlipidemia, unspecified: Secondary | ICD-10-CM

## 2022-08-20 DIAGNOSIS — I10 Essential (primary) hypertension: Secondary | ICD-10-CM | POA: Diagnosis not present

## 2022-08-20 DIAGNOSIS — I2510 Atherosclerotic heart disease of native coronary artery without angina pectoris: Secondary | ICD-10-CM

## 2022-08-20 MED ORDER — CARVEDILOL 6.25 MG PO TABS: 6.2500 mg | ORAL_TABLET | Freq: Two times a day (BID) | ORAL | 1 refills | Status: DC

## 2022-08-20 NOTE — Progress Notes (Signed)
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  Past Medical History    Past Medical History:  Diagnosis Date   Coronary artery disease    Diabetes mellitus without complication (HCC)    Dyslipidemia (high LDL; low HDL)    Family history of premature CAD    Headache    History of kidney stones    Low HDL (under 40)    MI (myocardial infarction) ([place]) 2016   PONV (postoperative nausea and vomiting)    as a child eye surgery   Past Surgical History:  Procedure Laterality Date   CORONARY ARTERY BYPASS GRAFT N/A 11/15/2014   Procedure: CORONARY ARTERY BYPASS GRAFTING (CABG), ON PUMP, TIMES THREE, USING LEFT INTERNAL MAMMARY ARTERY, RIGHT GREATER SAPHENOUS VEIN HARVESTED ENDOSCOPICALLY;  Surgeon: Melrose Nakayama, MD;  Location: [place];  Service: Open Heart Surgery;  Laterality: N/A;   CYSTOSCOPY W/ URETERAL STENT PLACEMENT Right 02/04/2017   Procedure: CYSTOSCOPY WITH STENT REPLACEMENT;  Surgeon: Hollice Espy, MD;  Location: [facility];  Service: Urology;  Laterality: Right;   CYSTOSCOPY W/ URETERAL STENT REMOVAL Left 02/04/2017   Procedure: CYSTOSCOPY WITH STENT REMOVAL;  Surgeon: Hollice Espy, MD;  Location: [facility];  Service: Urology;  Laterality: Left;   CYSTOSCOPY WITH STENT PLACEMENT Bilateral 01/17/2017   Procedure: CYSTOSCOPY WITH STENT PLACEMENT;  Surgeon: Hollice Espy, MD;  Location: [facility];  Service: Urology;  Laterality: Bilateral;   EYE SURGERY     multiple eye surgeries as a child   INTRA-AORTIC BALLOON PUMP INSERTION N/A 11/15/2014   Procedure: INTRA-AORTIC BALLOON PUMP INSERTION;  Surgeon: Troy Sine, MD;   Location: [facility] CATH LAB;  Service: Cardiovascular;  Laterality: N/A;   LEFT HEART CATH AND CORONARY ANGIOGRAPHY N/A 07/10/2021   Procedure: LEFT HEART CATH AND CORONARY ANGIOGRAPHY;  Surgeon: Nelva Bush, MD;  Location: [facility];  Service: Cardiovascular;  Laterality: N/A;   LEFT HEART CATHETERIZATION WITH CORONARY ANGIOGRAM N/A 11/14/2014   Procedure: LEFT HEART CATHETERIZATION WITH CORONARY ANGIOGRAM;  Surgeon: Troy Sine, MD;  Location: [facility] CATH LAB;  Service: Cardiovascular;  Laterality: N/A;   PERCUTANEOUS CORONARY STENT INTERVENTION (PCI-S) N/A 11/15/2014   Procedure: PERCUTANEOUS CORONARY STENT INTERVENTION (PCI-S);  Surgeon: Troy Sine, MD;  Location: [facility] CATH LAB;  Service: Cardiovascular;  Laterality: N/A;   STOMACH SURGERY     at 8 weeks old   TEE WITHOUT CARDIOVERSION N/A 11/15/2014   Procedure: TRANSESOPHAGEAL ECHOCARDIOGRAM (TEE);  Surgeon: Melrose Nakayama, MD;  Location: [place];  Service: Open Heart Surgery;  Laterality: N/A;   URETEROSCOPY WITH HOLMIUM LASER LITHOTRIPSY Bilateral 01/17/2017   Procedure: URETEROSCOPY WITH HOLMIUM LASER LITHOTRIPSY;  Surgeon: Hollice Espy, MD;  Location: [facility];  Service: Urology;  Laterality: Bilateral;   URETEROSCOPY WITH HOLMIUM LASER LITHOTRIPSY Right 02/04/2017   Procedure: URETEROSCOPY WITH HOLMIUM LASER LITHOTRIPSY;  Surgeon: Hollice Espy, MD;  Location: [facility];  Service: Urology;  Laterality: Right;    Allergies  Allergies  Allergen Reactions   Coconut (Cocos Nucifera) Hives    History of Present Illness    Seth Riddle 62 year old male with a previously mentioned past medical history of coronary artery  disease status post CABG (LIMA-LAD, SVG-D1, and SVG-OM2) and subsequent catheterization 02/2020) of [facility] demonstrated chronically occluded SVG-D1 an acutely occluded SVG-OM1 treated with PCI/DES, HFpEF due to ischemic cardiomyopathy, hyperlipidemia and type 2 diabetes.  Left heart  catheterization 06/2019 revealed severe two-vessel coronary artery disease with chronic total occlusion of the mid LAD and OM 2, mild to moderate nonobstructive RCA disease, widely patent LIMA-LAD, chronically occluded SVG-D1, acute/subacute thrombotic occlusion of SVG-OM2 with heavy thrombus burden, severely reduced LVEF of 25-30% with moderately elevated filling pressure.  Echocardiogram revealed LVEF 30-35%, and left ventricle demonstrated regional wall motion abnormalities.  Echocardiogram completed on 04/07/2022 revealed LVEF of 30-35%, left ventricle was mildly decreased function, global hypokinesis, G1 DD, without valvular abnormalities.  He was last seen in clinic 06/28/2022 presented.  20 fairly well.  He continued to have exertional fatigue and occasional shortness of breath but denied any chest pressure or decompensation.  He was started on Entresto 24/26 mg twice daily with the discontinuation of losartan.  Scheduled for limited echocardiogram in 3 months.  He returns to clinic today stating that over all he has been doing well. He denies any chest pain, shortness of breath, or peripheral edema. He does endorse a cough since he has had since around Thanksgiving and palpitations. He has been compliant with his current medication regimen and denies any recent hospitalizations or visits to the emergency department.   Home Medications    Current Outpatient Medications  Medication Sig Dispense Refill   aspirin EC 81 MG tablet Take 1 tablet (81 mg total) by mouth daily. 90 tablet 3   ezetimibe (ZETIA) 10 MG tablet Take 1 tablet (10 mg total) by mouth daily. 90 tablet 1   furosemide (LASIX) 20 MG tablet Take 1 tablet (20 mg total) by mouth daily. 90 tablet 1   icosapent Ethyl (VASCEPA) 1 g capsule Take 2 capsules (2 g total) by mouth 2 (two) times daily. 360 capsule 1   potassium chloride SA (KLOR-CON M) 20 MEQ tablet Take 1 tablet (20 mEq total) by mouth daily. 90 tablet 1   rosuvastatin  (CRESTOR) 40 MG tablet Take 1 tablet (40 mg total) by mouth daily. 90 tablet 1   sacubitril-valsartan (ENTRESTO) 24-26 MG Take 1 tablet by mouth 2 (two) times daily. 60 tablet 6   carvedilol (COREG) 6.25 MG tablet Take 1 tablet (6.25 mg total) by mouth 2 (two) times daily with a meal. 180 tablet 1   traZODone (DESYREL) 50 MG tablet Take 0.5 tablets (25 mg total) by mouth at bedtime as needed for sleep. (Patient not taking: Reported on 03/26/2022) 10 tablet 0   No current facility-administered medications for this visit.     Family History    Family History  Problem Relation Age of Onset   Heart disease Mother    Stroke Mother    Nephrolithiasis Mother    Heart attack Father    Stroke Father    Kidney cancer Neg Hx    Bladder Cancer Neg Hx    Prostate cancer Neg Hx    He indicated that his mother is alive. He indicated that his father is alive. He indicated that his maternal grandmother is deceased. He indicated that his maternal grandfather is deceased. He indicated that his paternal grandmother is deceased. He indicated that his paternal grandfather is deceased. He indicated that the status of his neg hx is unknown.  Social History    Social History   Socioeconomic History   Marital status:  Married    Spouse name: Not on file   Number of children: Not on file   Years of education: Not on file   Highest education level: Not on file  Occupational History   Not on file  Tobacco Use   Smoking status: Never   Smokeless tobacco: Never  Vaping Use   Vaping Use: Never used  Substance and Sexual Activity   Alcohol use: Yes    Comment: 1 glass of wine once a year   Drug use: No   Sexual activity: Not on file  Other Topics Concern   Not on file  Social History Narrative   Not on file   Social Determinants of Health   Financial Resource Strain: Not on file  Food Insecurity: Not on file  Transportation Needs: Not on file  Physical Activity: Not on file  Stress: Not on file   Social Connections: Not on file  Intimate Partner Violence: Not on file     Review of Systems    General:  No chills, fever, night sweats or weight changes.  Cardiovascular:  No chest pain, dyspnea on exertion, edema, orthopnea, endorses palpitations, paroxysmal nocturnal dyspnea. Dermatological: No rash, lesions/masses Respiratory: Endorses cough since Thanksgiving dry nonproductive, dyspnea Urologic: No hematuria, dysuria Abdominal:   No nausea, vomiting, diarrhea, bright red blood per rectum, melena, or hematemesis Neurologic:  No visual changes, wkns, changes in mental status. All other systems reviewed and are otherwise negative except as noted above.   Physical Exam    VS:  BP 130/89 (BP Location: Left Arm, Patient Position: Sitting, Cuff Size: Large)   Pulse 79   Ht 5\' 11"  (1.803 m)   Wt 265 lb 9.6 oz (120.5 kg)   SpO2 94%   BMI 37.04 kg/m  , BMI Body mass index is 37.04 kg/m.     Vitals:   08/20/22 1114 08/20/22 1119  BP: (!) 120/90 130/89    GEN: Well nourished, well developed, in no acute distress. HEENT: normal. Neck: Supple, no JVD, carotid bruits, or masses. Cardiac: RRR, no murmurs, rubs, or gallops. No clubbing, cyanosis, edema.  Radials 2+/PT 2+ and equal bilaterally.  Respiratory:  Respirations regular and unlabored, clear to auscultation bilaterally. GI: Soft, nontender, nondistended, BS + x 4. MS: no deformity or atrophy. Skin: warm and dry, no rash. Neuro:  Strength and sensation are intact. Psych: Normal affect.  Accessory Clinical Findings    ECG personally reviewed by me today- No new tracings were completed today  Lab Results  Component Value Date   WBC 8.5 07/11/2021   HGB 15.1 07/11/2021   HCT 43.7 07/11/2021   MCV 88.8 07/11/2021   PLT 161 07/11/2021   Lab Results  Component Value Date   CREATININE 1.07 03/18/2022   BUN 17 03/18/2022   NA 139 03/18/2022   K 3.9 03/18/2022   CL 105 03/18/2022   CO2 25 03/18/2022   Lab Results   Component Value Date   ALT 64 (H) 03/18/2022   AST 48 (H) 08/24/2021   ALKPHOS 103 08/24/2021   BILITOT 1.1 08/24/2021   Lab Results  Component Value Date   CHOL 125 06/28/2022   HDL 25 (L) 06/28/2022   LDLCALC 70 06/28/2022   TRIG 148 06/28/2022   CHOLHDL 5.0 06/28/2022    Lab Results  Component Value Date   HGBA1C 7.5 (H) 07/09/2021    Assessment & Plan   1.  Chronic HFrEF due to ischemic cardiomyopathy.  Last LVEF of 30-35% he  was recently escalated GDMT and started on Entresto 24/26 mg twice daily.  Appears to be euvolemic on exam today. Since that time he has tolerated it well.  States that he is compliant with his medications.  Limited echocardiogram is scheduled for February.  Today his carvedilol was increased from 3.25 mg twice daily to 6.25 mg twice daily.  Will continue to escalate GDMT as tolerated.  Encouraged to continue to weigh self daily, avoid high sodium foods, and increase fluid intake.  Please continue Entresto 24/26 mg twice daily, furosemide 20 mg daily, carvedilol increased to 6.25 mg twice daily. (Was previously on Farxiga which he is no longer taking)  2.  Coronary artery disease without anginal symptoms.  He is continued on aspirin 81 mg daily, ezetimibe 10 mg daily, Vascepa 1 2 g twice daily daily, and rosuvastatin 40 mg daily.   3.  Mixed hyperlipidemia with his last LDL of 7010/06/10.  He is continued on rosuvastatin, ezetimibe, and Vascepa.  4.  Essential hypertension with blood pressure today 120/90 recheck of 130/89.  The patient is taking his medications for this morning.  We are increasing his carvedilol to 6.25 mg twice daily.  Is encouraged to continue to monitor his pressure at home as well as continue to avoid high sodium foods.  5. Palpitations noted over the last 2 months. Not related to caffeine intake.  Think s maybe related to increased amount of stress he has been under lately. Carvedilol increased from 3.25 mg twice daily to 6.25 mg twice  daily.  5.  Disposition patient return to clinic to see MD/APP after limited echocardiogram is completed.  Depending on his LVEF to determine if he needs referral to EP.   Conal Shetley, NP 08/20/2022, 12:09 PM

## 2022-08-20 NOTE — Patient Instructions (Signed)
Medication Instructions:   Increase Carvedilol 6.25 mg tablet by mouth twice daily.  *If you need a refill on your cardiac medications before your next appointment, please call your pharmacy*   Lab Work:  NONE  If you have labs (blood work) drawn today and your tests are completely normal, you will receive your results only by: [place] (if you have MyChart) OR A paper copy in the mail If you have any lab test that is abnormal or we need to change your treatment, we will call you to review the results.   Testing/Procedures:  Keep echo appointment 10/01/2022 @ 9:30 am.   Follow-Up: At [facility], you and your health needs are our priority.  As part of our continuing mission to provide you with exceptional heart care, we have created designated Provider Care Teams.  These Care Teams include your primary Cardiologist (physician) and Advanced Practice Providers (APPs -  Physician Assistants and Nurse Practitioners) who all work together to provide you with the care you need, when you need it.  We recommend signing up for the patient portal called "MyChart".  Sign up information is provided on this After Visit Summary.  MyChart is used to connect with patients for Virtual Visits (Telemedicine).  Patients are able to view lab/test results, encounter notes, upcoming appointments, etc.  Non-urgent messages can be sent to your provider as well.   To learn more about what you can do with MyChart, go to [email or web address].    Your next appointment:   3 month(s)  The format for your next appointment:   In Person  Provider:   You may see or one of the following Advanced Practice Providers on your designated Care Team:   Murray Hodgkins, NP Christell Faith, PA-C Cadence Kathlen Mody, PA-C Gerrie Nordmann, NP  Important Information About Sugar

## 2022-10-01 ENCOUNTER — Ambulatory Visit: Payer: BC Managed Care – PPO | Attending: Cardiology

## 2022-10-01 DIAGNOSIS — I5022 Chronic systolic (congestive) heart failure: Secondary | ICD-10-CM

## 2022-10-01 LAB — ECHOCARDIOGRAM LIMITED
Area-P 1/2: 2.33 cm2
S' Lateral: 4.4 cm

## 2022-10-01 MED ORDER — PERFLUTREN LIPID MICROSPHERE
1.0000 mL | INTRAVENOUS | Status: AC | PRN
  Administered 2022-10-01: 2 mL via INTRAVENOUS

## 2022-10-02 NOTE — Progress Notes (Signed)
LVEF remains moderately-severely reduced at 30-35%, continue current medication regimen, keep follow-up appointment, recheck BMP since starting on [facility]

## 2022-11-01 ENCOUNTER — Other Ambulatory Visit: Payer: Self-pay | Admitting: Physician Assistant

## 2022-11-01 DIAGNOSIS — M5416 Radiculopathy, lumbar region: Secondary | ICD-10-CM

## 2022-11-11 ENCOUNTER — Ambulatory Visit: Admission: RE | Admit: 2022-11-11 | Payer: BC Managed Care – PPO | Source: Ambulatory Visit

## 2022-11-19 ENCOUNTER — Encounter: Payer: Self-pay | Admitting: Cardiology

## 2022-11-19 ENCOUNTER — Ambulatory Visit: Payer: BC Managed Care – PPO | Attending: Cardiology | Admitting: Cardiology

## 2022-11-19 VITALS — BP 122/80 | HR 89 | Ht 71.0 in | Wt 259.5 lb

## 2022-11-19 DIAGNOSIS — E785 Hyperlipidemia, unspecified: Secondary | ICD-10-CM

## 2022-11-19 DIAGNOSIS — Z79899 Other long term (current) drug therapy: Secondary | ICD-10-CM

## 2022-11-19 DIAGNOSIS — I2510 Atherosclerotic heart disease of native coronary artery without angina pectoris: Secondary | ICD-10-CM

## 2022-11-19 DIAGNOSIS — I5022 Chronic systolic (congestive) heart failure: Secondary | ICD-10-CM | POA: Diagnosis not present

## 2022-11-19 NOTE — Patient Instructions (Signed)
Medication Instructions:  No changes at this time.   *If you need a refill on your cardiac medications before your next appointment, please call your pharmacy*   Lab Work: [facility] today over at the [facility]. Stop at registration desk to check in.   If you have labs (blood work) drawn today and your tests are completely normal, you will receive your results only by: [place] (if you have MyChart) OR A paper copy in the mail If you have any lab test that is abnormal or we need to change your treatment, we will call you to review the results.   Testing/Procedures: None   Follow-Up: At [facility], you and your health needs are our priority.  As part of our continuing mission to provide you with exceptional heart care, we have created designated Provider Care Teams.  These Care Teams include your primary Cardiologist (physician) and Advanced Practice Providers (APPs -  Physician Assistants and Nurse Practitioners) who all work together to provide you with the care you need, when you need it.   Your next appointment:   3 month(s)  Provider:   Nelva Bush, MD or Gerrie Nordmann, NP

## 2022-11-19 NOTE — Addendum Note (Signed)
Addended by: Valora Corporal on: 11/19/2022 03:39 PM   Modules accepted: Orders

## 2022-11-19 NOTE — Progress Notes (Signed)
Cardiology Office Note:   Date:  11/19/2022  ID:  Seth Riddle, DOB [birth year], MRN [id number]  History of Present Illness:   Seth Riddle is a 62 y.o. male with past medical history of coronary artery disease status post CABG x 3 vessels (10/2014), chronic HFrEF due to ischemic cardiomyopathy, hyperlipidemia, type 2 diabetes, who is here today to follow-up on his coronary artery disease and HFrEF.  Left heart catheterization 06/2019 revealed severe two-vessel coronary artery disease with chronic total occlusions of the mid LAD and OM 2, mild to moderate nonobstructive RCA disease, widely patent LIMA-LAD, chronically occluded SVG-D1, acute/subacute thrombotic occlusion of SVG-OM 2 with heavy thrombus burden, severely reduced LVEF of 25-30% with moderately elevated filling pressures.  Echocardiogram revealed LVEF of 30-35% with left ventricular demonstrated regional wall abnormalities.  Echocardiogram 04/07/2022 demonstrated LVEF of 30-25%, the left ventricle was mildly decreased function, global hypokinesis, G1 DD, without valvular abnormalities.  He was last seen in clinic 08/20/2022 at the cardiac standpoint he was doing fairly well.  He continues to endorse a cough, since he had a cold around Thanksgiving and occasional palpitations.  Has not had any recent hospitalizations or visits to the emergency department.  Continue to work on escalating GDMT.  He was scheduled for a limited echocardiogram to reevaluate heart function.  Also to determine if he continued to need a referral to EP.  He returns to clinic today stating that overall he has been doing okay from the cardiac standpoint.  He does have a lot of stress as he is going through a divorce.  He denies any chest pain, shortness of breath, peripheral edema.  States that he has been compliant with all of his medications.  Previously he had been on Farxiga but somehow at his follow-up with his medication list.  There was a past history of  noncompliance with medications but over the last several months he has been taking his medications as scheduled.  He denies any hospitalizations or visits to the emergency department and recently had his echocardiogram completed.  ROS: Review of systems is negative with exception of what is mentioned in the HPI.  Studies Reviewed:    EKG:  No new tracings were completed today  Limited TTE 10/01/22 1. Left ventricular ejection fraction, by estimation, is 30 to 35%. The  left ventricle has moderately decreased function. The left ventricle  demonstrates mild global hypokinesis, with severe hypokinesis of the mid  to distal walls including  anterior/anteroseptal/lateral and apical regions. The left ventricular  internal cavity size was mildly dilated. Left ventricular diastolic  parameters are consistent with Grade I diastolic dysfunction (impaired  relaxation).   2. Right ventricular systolic function is normal. The right ventricular  size is normal. Tricuspid regurgitation signal is inadequate for assessing  PA pressure.   3. The mitral valve is normal in structure. No evidence of mitral valve  regurgitation. No evidence of mitral stenosis.   4. The aortic valve is tricuspid. Aortic valve regurgitation is not  visualized. No aortic stenosis is present.   5. The inferior vena cava is normal in size with greater than 50%  respiratory variability, suggesting right atrial pressure of 3 mmHg.   Risk Assessment/Calculations:              Physical Exam:   VS:  BP 122/80 (BP Location: Left Arm, Patient Position: Sitting, Cuff Size: Riddle)   Pulse 89   Ht 5\' 11"  (1.803 m)   Wt 259 lb 8 oz (  117.7 kg)   SpO2 97%   BMI 36.19 kg/m    Wt Readings from Last 3 Encounters:  11/19/22 259 lb 8 oz (117.7 kg)  08/20/22 265 lb 9.6 oz (120.5 kg)  06/28/22 266 lb (120.7 kg)     GEN: Well nourished, well developed in no acute distress NECK: No JVD; No carotid bruits CARDIAC: RRR, no murmurs,  rubs, gallops RESPIRATORY:  Clear to auscultation without rales, wheezing or rhonchi  ABDOMEN: Soft, non-tender, non-distended EXTREMITIES:  No edema; No deformity   ASSESSMENT AND PLAN:   Chronic HFrEF due to ischemic cardiomyopathy.  Last LVEF 30-35% on limited echocardiogram.  He appears euvolemic on exam today.  Weight is down 6 pounds since last office visit.  He is continued on carvedilol 6.25 mg twice daily, furosemide 20 mg daily, Entresto 24/26 mg twice daily, and has been started on spironolactone 12.5 mg daily.  Is also being sent for Bumex today to reevaluate kidney and to check his electrolytes as he is on diuretic therapy and potassium supplements.  Previously had been on Farxiga, but there is question as to why he is in no way it was discontinued.  With continued low LVEF he has been referred to EP.  Coronary artery disease status post CABG without anginal symptoms.  He continues to remain quite active.  He is continued on aspirin 81 mg daily, ezetimibe 10 mg daily, Vascepa 2 g twice daily, and rosuvastatin 40 mg daily.  Mixed hyperlipidemia with his last LDL of 70 on 06/2022.  He has been continued on ezetimibe 10 mg daily, Vascepa 2 g twice daily, and rosuvastatin 40 mg daily.  Last check he remained at goal there were no changes made to his medication regimen today.  Essential hypertension with a blood pressure today 122/80.  Blood pressure remained stable.  He is continued on his current medications.  Disposition patient return to clinic to see MD/APP in 3 months or sooner if needed.        Signed, Narcisa Ganesh, NP

## 2022-11-20 ENCOUNTER — Other Ambulatory Visit: Payer: Self-pay | Admitting: Cardiology

## 2022-11-20 DIAGNOSIS — I5022 Chronic systolic (congestive) heart failure: Secondary | ICD-10-CM

## 2022-11-20 DIAGNOSIS — E785 Hyperlipidemia, unspecified: Secondary | ICD-10-CM

## 2022-11-26 NOTE — H&P (View-Only) (Signed)
Electrophysiology Office Note:    Date:  11/27/2022   ID:  Seth Riddle, DOB 07/21/1961, MRN [id number]  [facility] Cardiologist:  Kristeen Miss, MD  [facility] Electrophysiologist:  Lanier Prude, MD   Referring MD: Charlsie Quest, NP   Chief Complaint: CHF  History of Present Illness:    Seth Riddle is a 62 y.o. male who I am seeing today for an evaluation of HF at the request of Charlsie Quest, NP. The patient has a history of CAD s/p CABG in 2016, HFrEF, HLD, DM. In 2020, EF noted to be 25%. He saw [facility] 11/2022. Recent echo on 10/01/2022 showed persistently reduced EF 30-35%.   Today he tells me he is under a tremendous amount of stress.  Some of it is related to finances.  He has been feeling more palpitations recently because of the stress he thinks.  He tells me that he was told in the past that he did not need a defibrillator.      Their past medical, social and family history was reveiwed.   ROS:   Please see the history of present illness.    All other systems reviewed and are negative.  EKGs/Labs/Other Studies Reviewed:    The following studies were reviewed today:  10/01/2022 Echo EF30-35% RV normal No significant valve abnormalities  06/28/2022 ECG shows sinus, narrow QRS duration 59ms.   EKG:  The ekg ordered today demonstrates sinus rhythm, frequent PVCs   Physical Exam:    VS:  BP 120/86   Pulse 86   Ht 5\' 11"  (1.803 m)   Wt 262 lb (118.8 kg)   SpO2 97%   BMI 36.54 kg/m     Wt Readings from Last 3 Encounters:  11/27/22 262 lb (118.8 kg)  11/19/22 259 lb 8 oz (117.7 kg)  08/20/22 265 lb 9.6 oz (120.5 kg)     GEN:  Well nourished, well developed in no acute distress CARDIAC: RRR, no murmurs, rubs, gallops.  Sternotomy incision well-healed RESPIRATORY:  Clear to auscultation without rales, wheezing or rhonchi       ASSESSMENT AND PLAN:    1. Chronic systolic CHF (congestive heart failure)   2. Coronary artery  disease involving native heart without angina pectoris, unspecified vessel or lesion type   3. Ischemic cardiomyopathy     #Chronic systolic heart failure NYHA II-III. Warm and dry. EF 30-35%. On good medical therapy. Discussed risk of sudden cardiac death at length during today's visit.  The patient has an ischemic CM (EF 30%), NYHA Class III CHF, and CAD.  He is referred by Charlsie Quest, NP for risk stratification of sudden death and consideration of ICD implantation.  At this time, he meets criteria for ICD implantation for primary prevention of sudden death.  I have had a thorough discussion with the patient reviewing options.  The patient and their family (if available) have had opportunities to ask questions and have them answered. The patient and I have decided together through a shared decision making process to proceed with ICD implant at this time.    Risks, benefits, alternatives to ICD implantation were discussed in detail with the patient today. The patient understands that the risks include but are not limited to bleeding, infection, pneumothorax, perforation, tamponade, vascular damage, renal failure, MI, stroke, death, inappropriate shocks, and lead dislodgement and wishes to proceed.  We will therefore schedule device implantation at the next available time.  Plan for [facility] [employer] VVI ICD.  #CAD No  ischemic symptoms today.      Signed, Rossie Muskrat. Lalla Brothers, MD, [facility], [facility] 11/27/2022 9:41 AM    Electrophysiology [facility]

## 2022-11-26 NOTE — Progress Notes (Unsigned)
  Electrophysiology Office Note:    Date:  11/26/2022   ID:  Seth Riddle, DOB November 29, 1960, MRN [id number]  [facility] Cardiologist:  Kristeen Miss, MD  [facility] Electrophysiologist:  Lanier Prude, MD   Referring MD: Charlsie Quest, NP   Chief Complaint: CHF  History of Present Illness:    Seth Riddle is a 62 y.o. male who I am seeing today for an evaluation of HF at the request of Charlsie Quest, NP. The patient has a history of CAD s/p CABG in 2016, HFrEF, HLD, DM. In 2020, EF noted to be 25%. He saw [facility] 11/2022. Recent echo on 10/01/2022 showed persistently reduced EF 30-35%.         Their past medical, social and family history was reveiwed.   ROS:   Please see the history of present illness.    All other systems reviewed and are negative.  EKGs/Labs/Other Studies Reviewed:    The following studies were reviewed today:  10/01/2022 Echo EF30-35% RV normal No significant valve abnormalities  06/28/2022 ECG shows sinus, narrow QRS duration 71ms.   EKG:  The ekg ordered today demonstrates ***   Physical Exam:    VS:  There were no vitals taken for this visit.    Wt Readings from Last 3 Encounters:  11/19/22 259 lb 8 oz (117.7 kg)  08/20/22 265 lb 9.6 oz (120.5 kg)  06/28/22 266 lb (120.7 kg)     GEN: *** Well nourished, well developed in no acute distress CARDIAC: ***RRR, no murmurs, rubs, gallops RESPIRATORY:  Clear to auscultation without rales, wheezing or rhonchi       ASSESSMENT AND PLAN:    No diagnosis found.  #Chronic systolic heart failure NYHA II-III. Warm and dry. EF 30-35%. On good medical therapy. Discussed risk of sudden cardiac death at length during today's visit.  The patient has an ischemic CM (EF 30%), NYHA Class III CHF, and CAD.  He is referred by Charlsie Quest, NP for risk stratification of sudden death and consideration of ICD implantation.  At this time, he meets criteria for ICD implantation for primary  prevention of sudden death.  I have had a thorough discussion with the patient reviewing options.  The patient and their family (if available) have had opportunities to ask questions and have them answered. The patient and I have decided together through a shared decision making process to proceed with ICD implant at this time.    Risks, benefits, alternatives to ICD implantation were discussed in detail with the patient today. The patient understands that the risks include but are not limited to bleeding, infection, pneumothorax, perforation, tamponade, vascular damage, renal failure, MI, stroke, death, inappropriate shocks, and lead dislodgement and wishes to proceed.  We will therefore schedule device implantation at the next available time.  Plan for [employer] ICD.  #CAD No ischemic symptoms today.      Signed, Rossie Muskrat. Lalla Brothers, MD, [employer], [facility] 11/26/2022 6:23 PM    Electrophysiology [facility]

## 2022-11-27 ENCOUNTER — Encounter: Payer: Self-pay | Admitting: Cardiology

## 2022-11-27 ENCOUNTER — Ambulatory Visit: Payer: BC Managed Care – PPO | Attending: Cardiology | Admitting: Cardiology

## 2022-11-27 VITALS — BP 120/86 | HR 86 | Ht 71.0 in | Wt 262.0 lb

## 2022-11-27 DIAGNOSIS — I2510 Atherosclerotic heart disease of native coronary artery without angina pectoris: Secondary | ICD-10-CM | POA: Diagnosis not present

## 2022-11-27 DIAGNOSIS — I255 Ischemic cardiomyopathy: Secondary | ICD-10-CM | POA: Diagnosis not present

## 2022-11-27 DIAGNOSIS — I5022 Chronic systolic (congestive) heart failure: Secondary | ICD-10-CM | POA: Diagnosis not present

## 2022-11-27 NOTE — Patient Instructions (Signed)
Medication Instructions:  Your physician recommends that you continue on your current medications as directed. Please refer to the Current Medication list given to you today.  *If you need a refill on your cardiac medications before your next appointment, please call your pharmacy*  Lab Work: BMET and CBC prior to your procedure You will get your lab work at [facility]) hospital.  Your lab work will be done at the [facility] mall next to [employer].  These are walk in labs- you will not need an appointment and you do not need to be fasting.    Testing/Procedures: Your physician has recommended that you have a defibrillator inserted. An implantable cardioverter defibrillator (ICD) is a small device that is placed in your chest or, in rare cases, your abdomen. This device uses electrical pulses or shocks to help control life-threatening, irregular heartbeats that could lead the heart to suddenly stop beating (sudden cardiac arrest). Leads are attached to the ICD that goes into your heart. This is done in the hospital and usually requires an overnight stay. Please see the instruction sheet given to you today for more information.  Follow-Up: At [facility], you and your health needs are our priority.  As part of our continuing mission to provide you with exceptional heart care, we have created designated Provider Care Teams.  These Care Teams include your primary Cardiologist (physician) and Advanced Practice Providers (APPs -  Physician Assistants and Nurse Practitioners) who all work together to provide you with the care you need, when you need it.  Your next appointment:   Wound check 10-14 days after your procedure with Levy Sjogren, NP  Follow up with Dr. Lalla Brothers 91 days after your procedure.

## 2022-11-29 ENCOUNTER — Other Ambulatory Visit
Admission: RE | Admit: 2022-11-29 | Discharge: 2022-11-29 | Disposition: A | Discharge status: Home / Self Care | Payer: BC Managed Care – PPO | Source: Ambulatory Visit | Attending: Cardiology | Admitting: Cardiology

## 2022-11-29 DIAGNOSIS — I5022 Chronic systolic (congestive) heart failure: Secondary | ICD-10-CM

## 2022-11-29 DIAGNOSIS — I255 Ischemic cardiomyopathy: Secondary | ICD-10-CM | POA: Diagnosis present

## 2022-11-29 DIAGNOSIS — I2510 Atherosclerotic heart disease of native coronary artery without angina pectoris: Secondary | ICD-10-CM

## 2022-11-29 LAB — BASIC METABOLIC PANEL
Anion gap: 9 (ref 5–15)
BUN: 14 mg/dL (ref 8–23)
CO2: 26 mmol/L (ref 22–32)
Calcium: 9.4 mg/dL (ref 8.9–10.3)
Chloride: 99 mmol/L (ref 98–111)
Creatinine, Ser: 1.13 mg/dL (ref 0.61–1.24)
GFR, Estimated: >60 mL/min (ref >60)
Glucose, Bld: 318 mg/dL — ABNORMAL HIGH (ref 70–99)
Potassium: 3.7 mmol/L (ref 3.5–5.1)
Sodium: 134 mmol/L — ABNORMAL LOW (ref 135–145)

## 2022-11-29 LAB — CBC WITH DIFFERENTIAL/PLATELET
Abs Immature Granulocytes: 0.02 10*3/uL (ref 0.00–0.07)
Basophils Absolute: 0.1 10*3/uL (ref 0.0–0.1)
Basophils Relative: 1 %
Eosinophils Absolute: 0.1 10*3/uL (ref 0.0–0.5)
Eosinophils Relative: 1 %
HCT: 46.1 % (ref 39.0–52.0)
Hemoglobin: 15.6 g/dL (ref 13.0–17.0)
Immature Granulocytes: 0 %
Lymphocytes Relative: 26 %
Lymphs Abs: 2.1 10*3/uL (ref 0.7–4.0)
MCH: 30 pg (ref 26.0–34.0)
MCHC: 33.8 g/dL (ref 30.0–36.0)
MCV: 88.7 fL (ref 80.0–100.0)
Monocytes Absolute: 0.5 10*3/uL (ref 0.1–1.0)
Monocytes Relative: 7 %
Neutro Abs: 5.4 10*3/uL (ref 1.7–7.7)
Neutrophils Relative %: 65 %
Platelets: 179 10*3/uL (ref 150–400)
RBC: 5.2 MIL/uL (ref 4.22–5.81)
RDW: 12 % (ref 11.5–15.5)
WBC: 8.2 10*3/uL (ref 4.0–10.5)
nRBC: 0 % (ref 0.0–0.2)

## 2022-12-02 NOTE — Pre-Procedure Instructions (Signed)
Attempted to call patient regarding procedure instructions.  Left voicemail on the following items: Arrival time 1130 Nothing to eat or drink after midnight No meds AM of procedure Responsible person to drive you home and stay with you for 24 hrs Wash with special soap night before and morning of procedure  

## 2022-12-03 ENCOUNTER — Other Ambulatory Visit: Payer: Self-pay

## 2022-12-03 ENCOUNTER — Ambulatory Visit (HOSPITAL_COMMUNITY)
Admission: RE | Admit: 2022-12-03 | Discharge: 2022-12-03 | Disposition: A | Discharge status: Home / Self Care | Payer: BC Managed Care – PPO | Source: Ambulatory Visit | Attending: Cardiology | Admitting: Cardiology

## 2022-12-03 ENCOUNTER — Ambulatory Visit (HOSPITAL_COMMUNITY): Payer: BC Managed Care – PPO

## 2022-12-03 ENCOUNTER — Encounter (HOSPITAL_COMMUNITY)
Admission: RE | Disposition: A | Discharge status: Home / Self Care | Payer: Self-pay | Source: Ambulatory Visit | Attending: Cardiology

## 2022-12-03 DIAGNOSIS — I2510 Atherosclerotic heart disease of native coronary artery without angina pectoris: Secondary | ICD-10-CM | POA: Insufficient documentation

## 2022-12-03 DIAGNOSIS — E785 Hyperlipidemia, unspecified: Secondary | ICD-10-CM | POA: Diagnosis not present

## 2022-12-03 DIAGNOSIS — Z951 Presence of aortocoronary bypass graft: Secondary | ICD-10-CM | POA: Insufficient documentation

## 2022-12-03 DIAGNOSIS — E119 Type 2 diabetes mellitus without complications: Secondary | ICD-10-CM | POA: Insufficient documentation

## 2022-12-03 DIAGNOSIS — I255 Ischemic cardiomyopathy: Secondary | ICD-10-CM | POA: Insufficient documentation

## 2022-12-03 DIAGNOSIS — I5022 Chronic systolic (congestive) heart failure: Secondary | ICD-10-CM | POA: Diagnosis not present

## 2022-12-03 HISTORY — PX: ICD IMPLANT: EP1208

## 2022-12-03 SURGERY — ICD IMPLANT

## 2022-12-03 MED ORDER — SODIUM CHLORIDE 0.9 % IV SOLN
80.0000 mg | INTRAVENOUS | Status: AC
  Administered 2022-12-03: 80 mg

## 2022-12-03 MED ORDER — LIDOCAINE HCL (PF) 1 % IJ SOLN
INTRAMUSCULAR | Status: DC | PRN
  Administered 2022-12-03: 60 mL

## 2022-12-03 MED ORDER — FENTANYL CITRATE (PF) 100 MCG/2ML IJ SOLN
INTRAMUSCULAR | Status: AC
  Filled 2022-12-03: qty 2

## 2022-12-03 MED ORDER — MIDAZOLAM HCL 5 MG/5ML IJ SOLN
INTRAMUSCULAR | Status: AC
  Filled 2022-12-03: qty 5

## 2022-12-03 MED ORDER — SODIUM CHLORIDE 0.9 % IV SOLN: INTRAVENOUS | Status: DC

## 2022-12-03 MED ORDER — FENTANYL CITRATE (PF) 100 MCG/2ML IJ SOLN
INTRAMUSCULAR | Status: DC | PRN
  Administered 2022-12-03 (×2): 25 ug via INTRAVENOUS

## 2022-12-03 MED ORDER — LIDOCAINE HCL (PF) 1 % IJ SOLN
INTRAMUSCULAR | Status: AC
  Filled 2022-12-03: qty 60

## 2022-12-03 MED ORDER — CEFAZOLIN SODIUM-DEXTROSE 2-4 GM/100ML-% IV SOLN
2.0000 g | INTRAVENOUS | Status: AC
  Administered 2022-12-03: 2 g via INTRAVENOUS

## 2022-12-03 MED ORDER — HEPARIN (PORCINE) IN NACL 1000-0.9 UT/500ML-% IV SOLN
INTRAVENOUS | Status: DC | PRN
  Administered 2022-12-03: 500 mL

## 2022-12-03 MED ORDER — ONDANSETRON HCL 4 MG/2ML IJ SOLN: 4.0000 mg | Freq: Four times a day (QID) | INTRAMUSCULAR | Status: DC | PRN

## 2022-12-03 MED ORDER — POVIDONE-IODINE 10 % EX SWAB
2.0000 | Freq: Once | CUTANEOUS | Status: AC
  Administered 2022-12-03: 2 via TOPICAL

## 2022-12-03 MED ORDER — ACETAMINOPHEN 325 MG PO TABS: 325.0000 mg | ORAL_TABLET | ORAL | Status: DC | PRN

## 2022-12-03 MED ORDER — SODIUM CHLORIDE 0.9 % IV SOLN
INTRAVENOUS | Status: AC
  Filled 2022-12-03: qty 2

## 2022-12-03 MED ORDER — MIDAZOLAM HCL 5 MG/5ML IJ SOLN
INTRAMUSCULAR | Status: DC | PRN
  Administered 2022-12-03 (×2): 1 mg via INTRAVENOUS

## 2022-12-03 MED ORDER — CEFAZOLIN SODIUM-DEXTROSE 2-4 GM/100ML-% IV SOLN
INTRAVENOUS | Status: AC
  Filled 2022-12-03: qty 100

## 2022-12-03 SURGICAL SUPPLY — 7 items
CABLE SURGICAL S-101-97-12 (CABLE) ×1 IMPLANT
ICD VIGILANT VR D232 (Pacemaker) IMPLANT
LEAD RELIANCE 0673 IMPLANT
PAD DEFIB RADIO PHYSIO CONN (PAD) ×1 IMPLANT
SHEATH 8FR PRELUDE SNAP 13 (SHEATH) IMPLANT
SHEATH PROBE COVER 6X72 (BAG) IMPLANT
TRAY PACEMAKER INSERTION (PACKS) ×1 IMPLANT

## 2022-12-03 NOTE — Interval H&P Note (Signed)
History and Physical Interval Note:  12/03/2022 12:21 PM  Seth Riddle  has presented today for surgery, with the diagnosis of heart failure.  The various methods of treatment have been discussed with the patient and family. After consideration of risks, benefits and other options for treatment, the patient has consented to  Procedure(s): ICD IMPLANT (N/A) as a surgical intervention.  The patient's history has been reviewed, patient examined, no change in status, stable for surgery.  I have reviewed the patient's chart and labs.  Questions were answered to the patient's satisfaction.     Jennier Schissler T Jenniger Figiel

## 2022-12-03 NOTE — Discharge Instructions (Signed)
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 After 7 days, you may progress as below.  You should remove your sling 24 hours after your procedure, unless otherwise instructed by your provider.     Tuesday December 10, 2022  Wednesday December 11, 2022 Thursday December 12, 2022 Friday December 13, 2022   Do not lift, push, pull, or carry anything over 10 pounds with the affected arm until 6 weeks (Tuesday Jan 14, 2023 ) after your procedure.   You may drive AFTER your wound check, unless you have been told otherwise by your provider.   Ask your healthcare provider when you can go back to work   INCISION/Dressing   If large square, outer bandage is left in place, this can be removed after 24 hours from your procedure. Do not remove steri-strips or glue as below.   Monitor your defibrillator site for redness, swelling, and drainage. Call the device clinic at [phone] if you experience these symptoms or fever/chills.  If your incision is sealed with Steri-strips or staples, you may shower 7 days after your procedure or when told by your provider. Do not remove the steri-strips or let the shower hit directly on your site. You may wash around your site with soap and water.    If you were discharged in a sling, please do not wear this during the day more than 48 hours after your surgery unless otherwise instructed. This may increase the risk of stiffness and soreness in your shoulder.   Avoid lotions, ointments, or perfumes over your incision until it is well-healed.  You may use a hot tub or a pool AFTER your wound check appointment if the incision is completely closed.  Your ICD is designed to protect you from life threatening heart rhythms. Because of this, you may receive a shock.   1 shock with no symptoms:  Call the office during business hours. 1 shock with  symptoms (chest pain, chest pressure, dizziness, lightheadedness, shortness of breath, overall feeling unwell):  Call 911. If you experience 2 or more shocks in 24 hours:  Call 911. If you receive a shock, you should not drive for 6 months per the [place] DMV IF you receive appropriate therapy from your ICD.   ICD Alerts:  Some alerts are vibratory and others beep. These are NOT emergencies. Please call our office to let us know. If this occurs at night or on weekends, it can wait until the next business day. Send a remote transmission.  If your device is capable of reading fluid status (for heart failure), you will be offered monthly monitoring to review this with you.   DEVICE MANAGEMENT Remote monitoring is used to monitor your ICD from home. This monitoring is scheduled every 91 days by our office. It allows [place] to keep an eye on the functioning of your device to ensure it is working properly. You will routinely see your Electrophysiologist annually (more often if necessary).   You should receive your ID card for your new device in 4-8 weeks. Keep this card with you at all times once received. Consider wearing a medical alert bracelet or necklace.  Your ICD  may be MRI compatible. This will be discussed at your next office visit/wound check.  You should avoid contact with strong electric or magnetic fields.   Do not use amateur (ham) radio equipment or  electric (arc) welding torches. MP3 player headphones with magnets should not be used. Some devices are safe to use if held at least 12 inches (30 cm) from your defibrillator. These include power tools, lawn mowers, and speakers. If you are unsure if something is safe to use, ask your health care provider.  When using your cell phone, hold it to the ear that is on the opposite side from the defibrillator. Do not leave your cell phone in a pocket over the defibrillator.  You may safely use electric blankets, heating pads, computers, and microwave  ovens.  Call the office right away if: You have chest pain. You feel more than one shock. You feel more short of breath than you have felt before. You feel more light-headed than you have felt before. Your incision starts to open up.  This information is not intended to replace advice given to you by your health care provider. Make sure you discuss any questions you have with your health care provider.

## 2022-12-04 ENCOUNTER — Encounter (HOSPITAL_COMMUNITY): Payer: Self-pay | Admitting: Cardiology

## 2022-12-19 ENCOUNTER — Encounter: Payer: Self-pay | Admitting: Cardiology

## 2022-12-19 ENCOUNTER — Ambulatory Visit: Payer: BC Managed Care – PPO | Attending: Internal Medicine

## 2022-12-19 DIAGNOSIS — I255 Ischemic cardiomyopathy: Secondary | ICD-10-CM

## 2022-12-19 LAB — CUP PACEART INCLINIC DEVICE CHECK
Date Time Interrogation Session: 20240502102407
Implantable Lead Connection Status: 753985
Implantable Lead Implant Date: 20240416
Implantable Lead Location: 753860
Implantable Lead Model: 673
Implantable Lead Serial Number: 219391
Implantable Pulse Generator Implant Date: 20240416
Pulse Gen Serial Number: 323240

## 2022-12-19 NOTE — Patient Instructions (Signed)

## 2022-12-19 NOTE — Progress Notes (Signed)

## 2023-02-21 ENCOUNTER — Encounter: Payer: Self-pay | Admitting: Cardiology

## 2023-02-21 ENCOUNTER — Ambulatory Visit: Payer: BC Managed Care – PPO | Attending: Cardiology | Admitting: Cardiology

## 2023-02-21 VITALS — BP 110/78 | HR 80 | Ht 71.0 in | Wt 258.4 lb

## 2023-02-21 DIAGNOSIS — I5022 Chronic systolic (congestive) heart failure: Secondary | ICD-10-CM | POA: Diagnosis not present

## 2023-02-21 DIAGNOSIS — I10 Essential (primary) hypertension: Secondary | ICD-10-CM

## 2023-02-21 DIAGNOSIS — Z95810 Presence of automatic (implantable) cardiac defibrillator: Secondary | ICD-10-CM

## 2023-02-21 DIAGNOSIS — R079 Chest pain, unspecified: Secondary | ICD-10-CM | POA: Diagnosis not present

## 2023-02-21 DIAGNOSIS — I25810 Atherosclerosis of coronary artery bypass graft(s) without angina pectoris: Secondary | ICD-10-CM

## 2023-02-21 DIAGNOSIS — E782 Mixed hyperlipidemia: Secondary | ICD-10-CM

## 2023-02-21 DIAGNOSIS — I129 Hypertensive chronic kidney disease with stage 1 through stage 4 chronic kidney disease, or unspecified chronic kidney disease: Secondary | ICD-10-CM | POA: Diagnosis not present

## 2023-02-21 DIAGNOSIS — E785 Hyperlipidemia, unspecified: Secondary | ICD-10-CM

## 2023-02-21 DIAGNOSIS — I255 Ischemic cardiomyopathy: Secondary | ICD-10-CM

## 2023-02-21 DIAGNOSIS — I2510 Atherosclerotic heart disease of native coronary artery without angina pectoris: Secondary | ICD-10-CM

## 2023-02-21 NOTE — Progress Notes (Signed)
Cardiology Office Note:  .   Date:  02/21/2023  ID:  [name], DOB [birth year], MRN [id number] PCP: Aviva Kluver  [facility] Providers Cardiologist:  Kristeen Miss, MD Electrophysiologist:  Lanier Prude, MD    History of Present Illness: .   Seth Riddle is a 62 y.o. male. History of coronary artery disease status post CABG x 3 pounds (10/2014), chronic HFrEF due to ischemic cardiomyopathy, hyperlipidemia, type 2 diabetes, recent insertion of ICD, who is here today to follow-up on his coronary artery disease and HFrEF.  Previous catheterization done 06/2019 revealed severe two-vessel coronary artery disease with chronic total occlusion of the mid LAD and OM 2, mild to moderate nonobstructive RCA disease widely patent LIMA-LAD, chronically due to SVG/D1, acute/subacute thrombotic occlusion of SVG-OM 2 with heavy thrombus burden severely reduced LVEF 25 to 30% with moderately elevated filling pressures.  Echocardiogram revealed an LVEF of 30-35% with regional wall motion abnormalities.  Repeat echocardiogram in 03/2022 demonstrated LVEF of 30-35%, left ventricle was mildly decreased function, global hypokinesis, G1 DD, without valvular abnormalities.  He was seen in clinic 08/2022 where he was continued to be escalated on GDMT scheduled for limited echocardiogram to reevaluate his heart function to determine if he needed to see EP.  LVEF remains 30-35% with escalation of GDMT.  He was referred to EP for implantation of ICD.  He underwent successful implantation of a [employer] VVI ICD that was implanted for primary prevention of sudden death on 12/27/22 by Dr. Lalla Brothers.  He returns to clinic today with complaints of worsening fatigue.  He does some occasional atypical chest discomfort that is fleeting at the very least significant areas chest.  He denies any other associated symptoms worsening shortness of breath, diaphoresis, or nausea.  He has recently undergone successful  implantation of ICD by EP with a well-healed site.  Continues to be under large amount of stress. He has not had a recent ischemic work-up. He denies any hospitalizations or visits tot he emergency department.   ROS: 10 point review of systems has been completed and considered negative with exception of what listed in HPI  Studies Reviewed: Marland Kitchen   EKG Interpretation Date/Time:  Friday February 21 2023 10:54:04 EDT Ventricular Rate:  80 PR Interval:  162 QRS Duration:  90 QT Interval:  378 QTC Calculation: 435 R Axis:   -1  Text Interpretation: Normal sinus rhythm Low voltage QRS Possible Anterolateral infarct (cited on or before 15-Nov-2014) When compared with ECG of 2022-12-27 16:41, Nonspecific T wave abnormality no longer evident in Inferior leads Confirmed by Charlsie Quest ([id number]) on 02/21/2023 11:06:54 AM    Limited TTE 10/01/22 1. Left ventricular ejection fraction, by estimation, is 30 to 35%. The  left ventricle has moderately decreased function. The left ventricle  demonstrates mild global hypokinesis, with severe hypokinesis of the mid  to distal walls including  anterior/anteroseptal/lateral and apical regions. The left ventricular  internal cavity size was mildly dilated. Left ventricular diastolic  parameters are consistent with Grade I diastolic dysfunction (impaired  relaxation).   2. Right ventricular systolic function is normal. The right ventricular  size is normal. Tricuspid regurgitation signal is inadequate for assessing  PA pressure.   3. The mitral valve is normal in structure. No evidence of mitral valve  regurgitation. No evidence of mitral stenosis.   4. The aortic valve is tricuspid. Aortic valve regurgitation is not  visualized. No aortic stenosis is present.   5. The inferior vena  cava is normal in size with greater than 50%  respiratory variability, suggesting right atrial pressure of 3 mmHg.  Risk Assessment/Calculations:             Physical Exam:   VS:   BP 110/78 (BP Location: Left Arm, Patient Position: Sitting, Cuff Size: Large)   Pulse 80   Ht 5\' 11"  (1.803 m)   Wt 258 lb 6 oz (117.2 kg)   SpO2 98%   BMI 36.04 kg/m    Wt Readings from Last 3 Encounters:  02/21/23 258 lb 6 oz (117.2 kg)  12/03/22 260 lb (117.9 kg)  11/27/22 262 lb (118.8 kg)    GEN: Well nourished, well developed in no acute distress NECK: No JVD; No carotid bruits CARDIAC: RRR, no murmurs, rubs, gallops; CTA insertion site to the left upper chest with well-approximated edges.  No drainage noted. Site open to air. RESPIRATORY:  Clear to auscultation without rales, wheezing or rhonchi  ABDOMEN: Soft, non-tender, non-distended EXTREMITIES:  No edema; No deformity   ASSESSMENT AND PLAN: .   Coronary artery disease status post CABG with some atypical chest discomfort.  He also has anginal equivalent dyspnea and fatigue which is new for him.  He has been continued on aspirin 81 mg daily, ezetimibe 10 mg daily, rosuvastatin 40 mg daily.  He is also being scheduled for [facility] for his atypical symptoms to rule out ischemia.  EKG completed today reveals normal sinus rhythm with a rate of 80 with no ischemic changes noted today.  Chronic HFrEF due to ischemic cardiomyopathy.  Last LVEF 30-25% limited echocardiogram.  He continues to appear euvolemic on exam today.  Recently underwent successful implantation of ICD for secondary prevention of sudden cardiac death.  He is continued on carvedilol 6.25 mg twice daily, furosemide 20 mg daily, Entresto 24/26 mg twice daily.  He had previously been started on Farxiga and spironolactone but unfortunately those medications seem to have fallen off of his medication list. There is a long hisotyr of medication noncompliance.   Essential hypertension: Blood pressure today 110/70.  Blood pressures remained stable.  He is continued on his current medication regimen at this time.  He requests a refill today.  Mixed hyperlipidemia last  LDL was 70 in 06/2022.  He is continued on ezetimibe 10 mg daily, Vascepa 2 g twice daily, rosuvastatin 40 mg daily.  States that he has been compliant on his medications.  ICD recently inserted in 12/03/2022.  He has at this point check.  Has some tingling around the side.  Healing well.  Will continue to follow with the EP.  And is well aware on how to do remote uploads.    Informed Consent   Shared Decision Making/Informed Consent The risks [chest pain, shortness of breath, cardiac arrhythmias, dizziness, blood pressure fluctuations, myocardial infarction, stroke/transient ischemic attack, nausea, vomiting, allergic reaction, radiation exposure, metallic taste sensation and life-threatening complications (estimated to be 1 in 10,000)], benefits (risk stratification, diagnosing coronary artery disease, treatment guidance) and alternatives of a nuclear stress test were discussed in detail with Seth Riddle and he agrees to proceed.     Dispo: Return to clinic to see MD/APP in 2 months or sooner if needed after testing is completed.  Signed, Suraya Vidrine, NP

## 2023-02-21 NOTE — Patient Instructions (Signed)
Medication Instructions:   Your physician recommends that you continue on your current medications as directed. Please refer to the Current Medication list given to you today.  *If you need a refill on your cardiac medications before your next appointment, please call your pharmacy*   Lab Work: none If you have labs (blood work) drawn today and your tests are completely normal, you will receive your results only by: MyChart Message (if you have MyChart) OR A paper copy in the mail If you have any lab test that is abnormal or we need to change your treatment, we will call you to review the results.   Testing/Procedures: Your provider has ordered a Lexiscan/ Exercise Myoview Stress test. This will take place at [facility] Please report to the [facility] medical mall entrance. The volunteers at the first desk will direct you where to go.  [facility] MYOVIEW  Your provider has ordered a Stress Test with nuclear imaging. The purpose of this test is to evaluate the blood supply to your heart muscle. This procedure is referred to as a "Non-Invasive Stress Test." This is because other than having an IV started in your vein, nothing is inserted or "invades" your body. Cardiac stress tests are done to find areas of poor blood flow to the heart by determining the extent of coronary artery disease (CAD). Some patients exercise on a treadmill, which naturally increases the blood flow to your heart, while others who are unable to walk on a treadmill due to physical limitations will have a pharmacologic/chemical stress agent called Lexiscan . This medicine will mimic walking on a treadmill by temporarily increasing your coronary blood flow.   Please note: these test may take anywhere between 2-4 hours to complete  How to prepare for your Myoview test:  Nothing to eat for 6 hours prior to the test No caffeine for 24 hours prior to test No smoking 24 hours prior to test. Your medication may be taken with water.  If your  doctor stopped a medication because of this test, do not take that medication. Ladies, please do not wear dresses.  Skirts or pants are appropriate. Please wear a short sleeve shirt. No perfume, cologne or lotion. Wear comfortable walking shoes. No heels!   PLEASE NOTIFY THE OFFICE AT LEAST 24 HOURS IN ADVANCE IF YOU ARE UNABLE TO KEEP YOUR APPOINTMENT.  [phone] AND  PLEASE NOTIFY NUCLEAR MEDICINE AT [facility] AT LEAST 24 HOURS IN ADVANCE IF YOU ARE UNABLE TO KEEP YOUR APPOINTMENT. [phone]    Follow-Up: At [facility], you and your health needs are our priority.  As part of our continuing mission to provide you with exceptional heart care, we have created designated Provider Care Teams.  These Care Teams include your primary Cardiologist (physician) and Advanced Practice Providers (APPs -  Physician Assistants and Nurse Practitioners) who all work together to provide you with the care you need, when you need it.  We recommend signing up for the patient portal called "MyChart".  Sign up information is provided on this After Visit Summary.  MyChart is used to connect with patients for Virtual Visits (Telemedicine).  Patients are able to view lab/test results, encounter notes, upcoming appointments, etc.  Non-urgent messages can be sent to your provider as well.   To learn more about what you can do with MyChart, go to [email or web address].    Your next appointment:   2 month(s)  Provider:   You may see  Charlsie Quest, NP

## 2023-02-23 ENCOUNTER — Other Ambulatory Visit: Payer: Self-pay | Admitting: Cardiology

## 2023-03-03 ENCOUNTER — Encounter
Admission: RE | Admit: 2023-03-03 | Discharge: 2023-03-03 | Disposition: A | Discharge status: Home / Self Care | Payer: BC Managed Care – PPO | Source: Ambulatory Visit | Attending: Cardiology | Admitting: Cardiology

## 2023-03-03 DIAGNOSIS — R079 Chest pain, unspecified: Secondary | ICD-10-CM | POA: Diagnosis not present

## 2023-03-03 LAB — NM MYOCAR MULTI W/SPECT W/WALL MOTION / EF
Base ST Depression (mm): 0 mm
LV dias vol: 235 mL (ref 62–150)
LV sys vol: 157 mL
MPHR: 158 {beats}/min
Nuc Stress EF: 33 %
Peak HR: 88 {beats}/min
Percent HR: 55 %
Rest HR: 71 {beats}/min
Rest Nuclear Isotope Dose: 10.2 mCi
SDS: 1
SRS: 25
SSS: 21
ST Depression (mm): 0 mm
Stress Nuclear Isotope Dose: 31.4 mCi
TID: 1.1

## 2023-03-03 MED ORDER — REGADENOSON 0.4 MG/5ML IV SOLN
0.4000 mg | Freq: Once | INTRAVENOUS | Status: AC
  Administered 2023-03-03: 0.4 mg via INTRAVENOUS

## 2023-03-03 MED ORDER — TECHNETIUM TC 99M TETROFOSMIN IV KIT
10.0000 | PACK | Freq: Once | INTRAVENOUS | Status: AC | PRN
  Administered 2023-03-03: 10.15 via INTRAVENOUS

## 2023-03-03 MED ORDER — TECHNETIUM TC 99M TETROFOSMIN IV KIT
31.3800 | PACK | Freq: Once | INTRAVENOUS | Status: AC | PRN
  Administered 2023-03-03: 31.38 via INTRAVENOUS

## 2023-03-03 NOTE — Progress Notes (Signed)
Stress testing shows infarct from previous heart attack but did not show any new ischemia or lack of blood flow. Heart function is comparable on stress as previous echoes.

## 2023-03-06 ENCOUNTER — Ambulatory Visit (INDEPENDENT_AMBULATORY_CARE_PROVIDER_SITE_OTHER): Payer: BC Managed Care – PPO

## 2023-03-06 DIAGNOSIS — I255 Ischemic cardiomyopathy: Secondary | ICD-10-CM

## 2023-03-11 LAB — CUP PACEART REMOTE DEVICE CHECK
Battery Remaining Longevity: 180 mo
Battery Remaining Percentage: 100 %
Brady Statistic RV Percent Paced: 0 %
Date Time Interrogation Session: 20240723134300
HighPow Impedance: 71 Ohm
Implantable Lead Connection Status: 753985
Implantable Lead Implant Date: 20240416
Implantable Lead Location: 753860
Implantable Lead Model: 673
Implantable Lead Serial Number: 219391
Implantable Pulse Generator Implant Date: 20240416
Lead Channel Impedance Value: 366 Ohm
Lead Channel Setting Pacing Amplitude: 3.5 V
Lead Channel Setting Pacing Pulse Width: 0.4 ms
Lead Channel Setting Sensing Sensitivity: 0.5 mV
Pulse Gen Serial Number: 323240
Zone Setting Status: 755011

## 2023-03-18 NOTE — Progress Notes (Unsigned)
  Electrophysiology Office Follow up Visit Note:    Date:  03/19/2023   ID:  Seth Riddle, DOB [birth year], MRN [id number]  PCP:  Oneita Hurt, No  [facility] Cardiologist:  Kristeen Miss, MD  [facility] Electrophysiologist:  Lanier Prude, MD    Interval History:    Seth Riddle is a 62 y.o. male who presents for a follow up visit.   He had an ICD implanted December 03, 2022 for primary prevention.  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  He has been doing well since implant.     Past medical, surgical, social and family history were reviewed.  ROS:   Please see the history of present illness.    All other systems reviewed and are negative.  EKGs/Labs/Other Studies Reviewed:    The following studies were reviewed today:  March 19, 2023 in clinic device interrogation personally reviewed Stable lead parameters.  Battery longevity okay.  No changes made today.       Physical Exam:    VS:  BP (!) 134/90 (BP Location: Left Arm, Patient Position: Sitting, Cuff Size: Large)   Pulse 78   Ht 5\' 11"  (1.803 m)   Wt 262 lb 6.4 oz (119 kg)   SpO2 93%   BMI 36.60 kg/m     Wt Readings from Last 3 Encounters:  03/19/23 262 lb 6.4 oz (119 kg)  02/21/23 258 lb 6 oz (117.2 kg)  12/03/22 260 lb (117.9 kg)     GEN:  Well nourished, well developed in no acute distress CARDIAC: RRR, no murmurs, rubs, gallops.  ICD pocket well-healed RESPIRATORY:  Clear to auscultation without rales, wheezing or rhonchi       ASSESSMENT:    1. Chronic systolic CHF (congestive heart failure) (HCC)   2. ICD (implantable cardioverter-defibrillator) in place    PLAN:    In order of problems listed above:  #Chronic systolic heart failure NYHA class II.  Warm and dry on exam.  Continue Coreg, Lasix, Entresto.  #ICD in situ Device functioning appropriately.  Continue remote monitoring.  Follow-up 1 year with APP.     Signed, Steffanie Dunn, MD, [facility], [facility] 03/19/2023 12:19 PM     Electrophysiology [facility]

## 2023-03-19 ENCOUNTER — Encounter: Payer: Self-pay | Admitting: Cardiology

## 2023-03-19 ENCOUNTER — Ambulatory Visit: Payer: BC Managed Care – PPO | Attending: Cardiology | Admitting: Cardiology

## 2023-03-19 VITALS — BP 134/90 | HR 78 | Ht 71.0 in | Wt 262.4 lb

## 2023-03-19 DIAGNOSIS — I5022 Chronic systolic (congestive) heart failure: Secondary | ICD-10-CM

## 2023-03-19 DIAGNOSIS — Z95810 Presence of automatic (implantable) cardiac defibrillator: Secondary | ICD-10-CM

## 2023-03-19 NOTE — Patient Instructions (Signed)
Medication Instructions:  The current medical regimen is effective;  continue present plan and medications.  *If you need a refill on your cardiac medications before your next appointment, please call your pharmacy*   Follow-Up: At [facility], you and your health needs are our priority.  As part of our continuing mission to provide you with exceptional heart care, we have created designated Provider Care Teams.  These Care Teams include your primary Cardiologist (physician) and Advanced Practice Providers (APPs -  Physician Assistants and Nurse Practitioners) who all work together to provide you with the care you need, when you need it.  We recommend signing up for the patient portal called "MyChart".  Sign up information is provided on this After Visit Summary.  MyChart is used to connect with patients for Virtual Visits (Telemedicine).  Patients are able to view lab/test results, encounter notes, upcoming appointments, etc.  Non-urgent messages can be sent to your provider as well.   To learn more about what you can do with MyChart, go to [email or web address].    Your next appointment:   12 month(s)  Provider:   Sherie Don, NP

## 2023-03-20 NOTE — Progress Notes (Signed)
Remote ICD transmission.   

## 2023-04-29 ENCOUNTER — Encounter: Payer: Self-pay | Admitting: Cardiology

## 2023-04-29 ENCOUNTER — Ambulatory Visit: Payer: BC Managed Care – PPO | Attending: Cardiology | Admitting: Cardiology

## 2023-04-29 VITALS — BP 122/88 | HR 78 | Ht 71.0 in | Wt 259.8 lb

## 2023-04-29 DIAGNOSIS — I5022 Chronic systolic (congestive) heart failure: Secondary | ICD-10-CM

## 2023-04-29 DIAGNOSIS — Z95810 Presence of automatic (implantable) cardiac defibrillator: Secondary | ICD-10-CM | POA: Diagnosis not present

## 2023-04-29 DIAGNOSIS — Z951 Presence of aortocoronary bypass graft: Secondary | ICD-10-CM

## 2023-04-29 DIAGNOSIS — E118 Type 2 diabetes mellitus with unspecified complications: Secondary | ICD-10-CM

## 2023-04-29 DIAGNOSIS — I255 Ischemic cardiomyopathy: Secondary | ICD-10-CM | POA: Diagnosis not present

## 2023-04-29 DIAGNOSIS — I2510 Atherosclerotic heart disease of native coronary artery without angina pectoris: Secondary | ICD-10-CM

## 2023-04-29 DIAGNOSIS — I10 Essential (primary) hypertension: Secondary | ICD-10-CM

## 2023-04-29 DIAGNOSIS — G479 Sleep disorder, unspecified: Secondary | ICD-10-CM

## 2023-04-29 DIAGNOSIS — Z7984 Long term (current) use of oral hypoglycemic drugs: Secondary | ICD-10-CM

## 2023-04-29 DIAGNOSIS — E785 Hyperlipidemia, unspecified: Secondary | ICD-10-CM

## 2023-04-29 NOTE — Patient Instructions (Signed)
Medication Instructions:  Your Physician recommend you continue on your current medication as directed.    *If you need a refill on your cardiac medications before your next appointment, please call your pharmacy*  Lab Work: None If you have labs (blood work) drawn today and your tests are completely normal, you will receive your results only by: MyChart Message (if you have MyChart) OR A paper copy in the mail If you have any lab test that is abnormal or we need to change your treatment, we will call you to review the results.  Testing/Procedures: None  Follow-Up: At [facility], you and your health needs are our priority.  As part of our continuing mission to provide you with exceptional heart care, we have created designated Provider Care Teams.  These Care Teams include your primary Cardiologist (physician) and Advanced Practice Providers (APPs -  Physician Assistants and Nurse Practitioners) who all work together to provide you with the care you need, when you need it.  We recommend signing up for the patient portal called "MyChart".  Sign up information is provided on this After Visit Summary.  MyChart is used to connect with patients for Virtual Visits (Telemedicine).  Patients are able to view lab/test results, encounter notes, upcoming appointments, etc.  Non-urgent messages can be sent to your provider as well.   To learn more about what you can do with MyChart, go to [email or web address].    Your next appointment:   3 month(s)  Provider:   You may see Yvonne Kendall, MD

## 2023-04-29 NOTE — Progress Notes (Signed)
Cardiology Office Note:  .   Date:  04/29/2023  ID:  Seth Riddle, DOB December 06, 1960, MRN [id number] PCP: Oneita Hurt, No  [facility] Providers Cardiologist:  Kristeen Miss, MD Electrophysiologist:  Lanier Prude, MD    History of Present Illness: .   Seth Riddle is a 62 y.o. 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  LHC 06/2019 revealed severe two-vessel coronary disease with chronic total occlusion of the mid LAD and OM 2, mild to moderate nonobstructive RCA disease widely patent LIMA-LAD, acute/subacute thrombotic occlusion of the SVG-OM 2 with heavy thrombus burden severely reduced LVEF 25-30% with moderately elevated filling pressures.  Echocardiogram showed EF 30-25% with regional wall motion abnormalities.  Repeat echo 03/2022 demonstrated EF 30-35%, left ventricle with mildly decreased function, global hypokinesis, G1 DD, without valvular abnormalities.  He was seen in clinic 08/2022 where he was continued to be escalated on GDMT schedule for limited echocardiogram to reevaluate his heart function determine if needed CAD.  LVEF remains 30-35% with escalation of GDMT.  He was referred for EP for implantation of ICD.  He underwent successful implantation of [employer] VVI ICD that was implanted for primary prevention of sudden cardiac death on Dec 10, 2022 by Dr. Lalla Brothers.  He was last seen in clinic 03/09/2023 by Dr. Lalla Brothers.  Device interrogation was personally reviewed which showed stable lead parameters with battery longevity okay and no changes that were made today.  No changes were made to his medication regimen and he was advised to follow-up in 1 year.  He returns to clinic today stating that overall he has been doing well.  He states that he has been having issues with sleep and is only  averaging approximately 4 hours of sleep per night.  He states this has been ongoing for approximately 30 years but he is under an increased amount of stress and it has actually been exacerbated as of late.  He states when he tries to go to sleep at night he has knowledge of feeling his ICD which complicates trying to go to sleep as well.  He denies any chest pains or chest tightness but states that he can still feel his surgical incision from his CABG several years ago.  Denies any swelling, palpitations, or shortness of breath.  States that he recently followed up with PCP and was started on metformin due to high blood sugars.  He states that he has been making changes to his weight, stating that he has given up sodas and ice cream.  Denies any hospitalizations or visits to the emergency department.  States that he has been compliant with his current medication regimen.  States that he also continues to exercise with weightlifting as he has decreased the amount of weight and increased his reps.  ROS: 10 point review of system has been completed and considered negative with exception of what is been listed in the HPI  Studies Reviewed: Marland Kitchen       Lexiscan MPI 03/03/23   Findings are consistent with infarction. The study is high risk due to large defect and low EF but overall, no ischemia.   No ST deviation was noted.   LV perfusion is abnormal. There is no evidence of ischemia. There is evidence of infarction. Defect 1: There is a large defect with severe reduction in uptake present  in the apical to mid anterior, anteroseptal and apex location(s) that is fixed. There is abnormal wall motion in the defect area. Consistent with infarction.   Left ventricular function is abnormal. Global function is severely reduced. There was a single regional abnormality. Nuclear stress EF: 33%. End diastolic cavity size is moderately enlarged. End systolic cavity size is moderately enlarged.  Limited TTE 10/01/22 1. Left  ventricular ejection fraction, by estimation, is 30 to 35%. The  left ventricle has moderately decreased function. The left ventricle  demonstrates mild global hypokinesis, with severe hypokinesis of the mid  to distal walls including  anterior/anteroseptal/lateral and apical regions. The left ventricular  internal cavity size was mildly dilated. Left ventricular diastolic  parameters are consistent with Grade I diastolic dysfunction (impaired  relaxation).   2. Right ventricular systolic function is normal. The right ventricular  size is normal. Tricuspid regurgitation signal is inadequate for assessing  PA pressure.   3. The mitral valve is normal in structure. No evidence of mitral valve  regurgitation. No evidence of mitral stenosis.   4. The aortic valve is tricuspid. Aortic valve regurgitation is not  visualized. No aortic stenosis is present.   5. The inferior vena cava is normal in size with greater than 50%  respiratory variability, suggesting right atrial pressure of 3 mmHg.  Risk Assessment/Calculations:             Physical Exam:   VS:  BP 122/88 (BP Location: Left Arm, Patient Position: Sitting, Cuff Size: Large)   Pulse 78   Ht 5\' 11"  (1.803 m)   Wt 259 lb 12.8 oz (117.8 kg)   SpO2 94%   BMI 36.23 kg/m    Wt Readings from Last 3 Encounters:  04/29/23 259 lb 12.8 oz (117.8 kg)  03/19/23 262 lb 6.4 oz (119 kg)  02/21/23 258 lb 6 oz (117.2 kg)    GEN: Well nourished, well developed in no acute distress NECK: No JVD; No carotid bruits CARDIAC: RRR, no murmurs, rubs, gallops RESPIRATORY:  Clear to auscultation without rales, wheezing or rhonchi  ABDOMEN: Soft, non-tender, non-distended EXTREMITIES:  No edema; No deformity   ASSESSMENT AND PLAN: .   Coronary artery disease status post CABG with atypical chest discomfort that has unchanged happens with rest or exertion.  He originally thought it was from lifting too much weight at the gym so he has decreased his  weight and increased his wraps. Recent Lexiscan Myoview shows infarct but without ischemic changes noted which is consistent with his prior heart attack.  He is continued on aspirin 81 mg daily, rosuvastatin 40 mg daily, and ezetimibe, and Vascepa.  He is encouraged to continue with his exercise and dietary changes.  Chronic HFrEF due to ischemic cardiomyopathy.  Last LVEF 30-35% on limited TTE 09/2022.  He continues to appear euvolemic on exam with NYHA I symptoms.  He is continued on carvedilol 6.25 mg twice daily, furosemide 20 mg daily, Entresto 24/26 mg twice daily and Jardiance 10 mg daily.  He recently had blood work completed by his PCP which revealed stable kidney function but significant for diabetes.  He has been encouraged to continue with his dietary modifications.  Will consider adding spironolactone back to his current regimen on return.  Primary hypertension with blood pressure today 122/88.  Blood pressure remained stable.  He is continued on his current medication regimen without changes.  Mixed hyperlipidemia with his last lipid panel being completed by PCP on 05/01/2023 with an LDL that  was unable to be calculated due to triglyceride level of 433.  Patient has repeat blood work upcoming with his PCP.  He states that these labs were also not fasting labs.  Type 2 diabetes with a hemoglobin A1c of 11.4.  He has been placed on metformin 500 mg daily with upcoming blood work scheduled with his PCP.  This will continue to be followed by his PCP.  ICD in situ for primary prevention of sudden cardiac death due to ischemic cardiomyopathy.  Patient encouraged to continue to do remote uploads and will continue to follow with EP.  Altered sleeping where he states he is only able to sleep approximately 4 hours per night.  Has had several sleep studies according to the patient and has not been diagnosed with sleep apnea.  Unfortunately he was previously on medication but is unsure of the name.   Advised that this is typically managed by his PCP but would be more than happy to send a message to his provider to see if he would be willing to start him on something.       Dispo: Patient to return to clinic to see MD/APP in 3 months or sooner if needed  Signed, Seneca Gadbois, NP

## 2023-05-02 ENCOUNTER — Telehealth: Payer: Self-pay

## 2023-05-02 NOTE — Telephone Encounter (Signed)
The patient called stating he is going through a divorce and his wife canceled their insurance. As of today he no longer has insurance so he wanted to cancel his home remote schedule. I told him I will no charge his October remote appointment and cancel the rest. He can call me when he has insurance. I let him know to check into Medicare and medicaid to see if he qualify for their services. I told him if he can not do remote monitoring he will need to come into the office every 3 months to get his ICD interrogated.  Baird Lyons also told him if he get to the point if he feels like he cannot afford his medications to let us know and we can see if he qualify to get help with his medications.  I told the patient I will let Dr. Lalla Brothers know. He verbalized understanding and thanked me for my help.

## 2023-05-07 ENCOUNTER — Telehealth: Payer: Self-pay

## 2023-05-07 DIAGNOSIS — I255 Ischemic cardiomyopathy: Secondary | ICD-10-CM

## 2023-05-07 NOTE — Telephone Encounter (Signed)
Attempted to contact patient, left message to call office back - will place order for social work as Dr. Lalla Brothers has suggested.   Lanier Prude, MD  Frutoso Schatz, RN2 days ago    Can we refer him to social work for possible insurance help? Thanks, CL

## 2023-06-02 ENCOUNTER — Encounter: Payer: Self-pay | Admitting: Pharmacy Technician

## 2023-06-02 ENCOUNTER — Other Ambulatory Visit (HOSPITAL_COMMUNITY): Payer: Self-pay

## 2023-06-02 NOTE — Telephone Encounter (Signed)
error 

## 2023-06-05 ENCOUNTER — Ambulatory Visit (INDEPENDENT_AMBULATORY_CARE_PROVIDER_SITE_OTHER): Payer: BC Managed Care – PPO

## 2023-06-05 DIAGNOSIS — I5022 Chronic systolic (congestive) heart failure: Secondary | ICD-10-CM

## 2023-06-05 DIAGNOSIS — I255 Ischemic cardiomyopathy: Secondary | ICD-10-CM

## 2023-06-06 LAB — CUP PACEART REMOTE DEVICE CHECK
Battery Remaining Longevity: 180 mo
Battery Remaining Percentage: 100 %
Brady Statistic RV Percent Paced: 0 %
Date Time Interrogation Session: 20241017043200
HighPow Impedance: 63 Ohm
Implantable Lead Connection Status: 753985
Implantable Lead Implant Date: 20240416
Implantable Lead Location: 753860
Implantable Lead Model: 673
Implantable Lead Serial Number: 219391
Implantable Pulse Generator Implant Date: 20240416
Lead Channel Impedance Value: 337 Ohm
Lead Channel Setting Pacing Amplitude: 2.5 V
Lead Channel Setting Pacing Pulse Width: 0.4 ms
Lead Channel Setting Sensing Sensitivity: 0.5 mV
Pulse Gen Serial Number: 323240
Zone Setting Status: 755011

## 2023-06-09 ENCOUNTER — Other Ambulatory Visit (HOSPITAL_COMMUNITY): Payer: Self-pay

## 2023-06-11 ENCOUNTER — Other Ambulatory Visit (HOSPITAL_COMMUNITY): Payer: Self-pay

## 2023-06-25 NOTE — Addendum Note (Signed)
Addended by: Geralyn Flash D on: 06/25/2023 10:16 AM   Modules accepted: Level of Service

## 2023-06-25 NOTE — Progress Notes (Signed)
Remote ICD transmission.   

## 2023-07-30 ENCOUNTER — Ambulatory Visit: Payer: BC Managed Care – PPO | Admitting: Internal Medicine

## 2023-12-02 NOTE — Progress Notes (Signed)
 Subjective:     Patient ID: Seth Riddle is a 63 y.o. male.  Cough Associated symptoms include a fever.  Fever  Associated symptoms include coughing.  : PRESENTS WITH 2 DAY HX OF NASAL CONGESTION, SNEEZING FOLLOWED WITH NP COUGH AND SUBJECTIVE FEVER. NO N/V/D. SOME HA. TAKING OTC MUCINEX  FOR COUGH. RECORDS REVIEWED. DECLINES TESTING.   Past Medical History:  has a past medical history of AICD (automatic cardioverter/defibrillator) present (11/2022), Chronic systolic CHF (congestive heart failure) (CMS/HHS-HCC) (01/05/2015), Coronary artery disease involving native heart without angina pectoris (01/02/2023), Hyperlipidemia, Hypertension, Lumbar disc disease, S/P CABG x 3 (11/15/2014), and Type 2 diabetes with complication (CMS/HHS-HCC) (12/23/2014). Past Surgical History:  has a past surgical history that includes Coronary artery bypass graft (10/2014); insertion  icd generator with existing multiple leads (11/2022); laminectomy lumbar spine (2010); strabismus eye surgery (Right); and Exploratory laparotomy. Family History: family history includes Diabetes in an other family member; Heart disease in his father and mother. Social History:  reports that he has never smoked. He has never used smokeless tobacco. He reports that he does not drink alcohol and does not use drugs. Prior to encounter Medications:  Current Outpatient Medications on File Prior to Visit  Medication Sig Dispense Refill  . aspirin  81 MG EC tablet Take 81 mg by mouth once daily (Patient not taking: Reported on 12/02/2023)    . blood glucose diagnostic test strip Use once daily. One Touch Ultra (Patient not taking: Reported on 12/02/2023)    . carvediloL  (COREG ) 6.25 MG tablet Take 1 tablet (6.25 mg total) by mouth 2 (two) times daily with meals (Patient not taking: Reported on 12/02/2023) 180 tablet 3  . empagliflozin (JARDIANCE) 10 mg tablet Take 1 tablet (10 mg total) by mouth once daily (Patient not taking: Reported on  12/02/2023) 30 tablet 5  . ENTRESTO  24-26 mg tablet Take 1 tablet by mouth 2 (two) times daily (Patient not taking: Reported on 12/02/2023) 180 tablet 3  . ezetimibe  (ZETIA ) 10 mg tablet Take 1 tablet (10 mg total) by mouth once daily (Patient not taking: Reported on 12/02/2023) 90 tablet 3  . FUROsemide  (LASIX ) 20 MG tablet Take 1 tablet (20 mg total) by mouth once daily (Patient not taking: Reported on 12/02/2023) 90 tablet 3  . KLOR-CON  M20 20 mEq ER tablet Take 1 tablet (20 mEq total) by mouth once daily (Patient not taking: Reported on 12/02/2023) 90 tablet 3  . lancets Use 1 each 3 (three) times daily. One Touch Delica 33G (Patient not taking: Reported on 12/02/2023)    . metFORMIN  (GLUCOPHAGE -XR) 500 MG XR tablet Take 1 tablet (500 mg total) by mouth once daily (Patient not taking: Reported on 12/02/2023) 30 tablet 5  . rosuvastatin  (CRESTOR ) 40 MG tablet Take 1 tablet (40 mg total) by mouth once daily (Patient not taking: Reported on 12/02/2023) 90 tablet 3  . VASCEPA  1 gram capsule Take 2 capsules (2 g total) by mouth 2 (two) times daily (Patient not taking: Reported on 12/02/2023) 360 capsule 3   No current facility-administered medications on file prior to visit.   Allergies: is allergic to coconut; coconut oil; and coconut oil, hydrogenated.  This an established patient is here today for: office visit.  Review of Systems  Constitutional:  Positive for fever.  Respiratory:  Positive for cough.        Objective:   Physical Exam Vitals and nursing note reviewed.  Constitutional:      General: He is not in acute distress.  Appearance: Normal appearance. He is not ill-appearing or toxic-appearing.  HENT:     Head: Normocephalic and atraumatic.     Right Ear: Tympanic membrane and ear canal normal.     Left Ear: Tympanic membrane and ear canal normal.     Nose: Congestion present. No rhinorrhea.     Mouth/Throat:     Mouth: Mucous membranes are moist.     Pharynx: Posterior  oropharyngeal erythema present.  Cardiovascular:     Rate and Rhythm: Normal rate and regular rhythm.  Pulmonary:     Effort: Pulmonary effort is normal. No respiratory distress.     Breath sounds: Normal breath sounds. No wheezing.     Comments: CONGESTED COUGH Musculoskeletal:     Cervical back: Normal range of motion and neck supple. No rigidity.  Lymphadenopathy:     Cervical: No cervical adenopathy.  Skin:    General: Skin is warm and dry.     Findings: No rash.  Neurological:     Mental Status: He is alert.        Assessment:     1. Acute upper respiratory infection   -     doxycycline (VIBRA-TABS) 100 MG tablet; Take 1 tablet (100 mg total) by mouth 2 (two) times daily for 10 days -     predniSONE (DELTASONE) 20 MG tablet; Take 1 tablet (20 mg total) by mouth 2 (two) times daily for 5 days -     promethazine -dextromethorphan (PROMETHAZINE -DM) 6.25-15 mg/5 mL syrup; Take 5 mLs by mouth every 6 (six) hours as needed for up to 7 days     Plan:     Patient Instructions  I HAVE PLACED YOU ON AND ANTIBIOTIC FOR UPPER RESP TRACT INFECTION. PREDNISONE TO HELP WITH COUGH  AND COUGH MED  MEASURE CAREFULLY  AVOID CAFFEINE AND MILK PRODUCTS.  MONITOR YOUR SYMPTOM. FOLLOWUP WITH YOUR PCP AS NEEDED.

## 2024-08-09 NOTE — Progress Notes (Addendum)
 Subjective Patient ID: Seth Riddle is a 63 y.o. male.  Seth Riddle is a 63 y.o. male with a PMH significant for T2DM,  CHF, HLD, presenting to the clinic for follow up. He was last here 2 weeks ago on 07/26/2024, during which time he had disclosed that he stopped all of his medications 1.5 years ago, due to his divorce causing some emotional distress at the time. He was started on some of his medications at last visit, which he has been compliant with.      Type II DM: Patient with a history of type 2 diabetes.  He was previously on metformin  and Jardiance, which he had not taken in 1.5 years, which was restarted at his last OV. His recent A1C was 11.3%.  He has been compliant with medications, and has titrated up metformin  to 500 mg BID so far, with plans to titrate up to the max dosage.  He is not checking his blood glucose at home, though he does report that he has a glucometer. He is due for a diabetic eye exam, and had been referred to opthamology at his las OV; he notes he has been contacted about scheduling this. He is due for a foot exam as well..  Patient denies blurry vision, numbness, tingling, or burning in the feet.  He also denies polydipsia, polyphasia, polyuria, or unintentional weight loss.  CAD, CHF, S/P CABG X 3: Patient with CAD and CHF, S/P CABG x 3.  He reports he was told that his ejection fraction was 30% and has an ICD present.  He was previously prescribed Entresto , carvedilol , Crestor , low-dose aspirin , brilinta, jardiance, and Lasix ; at his last OV, hr was restarted on Crestor , Entresto , low-dose aspirin , and jardiance. He notes that he was unable to fill the estresto due to problems with pharmacy. He has restarted all other medications. We held off on restarting carvedilol  due to blood pressure being on the lower end, and brilinta, as he was unsure what he was taking this for. At his last OV, he had also been referred to cardiology, and notes he has an appointment  scheduled for 09/15/2023. He last saw cardiologist 1.5 years ago. Patient denies chest pain, shortness of breath, heart palpitations, leg edema, headaches, vision changes, etc.    HLD: Patient with hyperlipidemia. He was previously on Crestor  40 mg and ezetimibe ; at his last OV, he was restarted on crestor , which he has been compliant with.           Review of Systems  Constitutional:  Negative for chills, diaphoresis, fatigue and fever.  Eyes:  Negative for visual disturbance.  Respiratory:  Negative for chest tightness and shortness of breath.   Cardiovascular:  Negative for chest pain, palpitations and leg swelling.  Gastrointestinal:  Negative for abdominal pain, nausea and vomiting.  Endocrine: Negative for cold intolerance, heat intolerance, polydipsia, polyphagia and polyuria.  Musculoskeletal:  Negative for arthralgias and myalgias.  Skin:  Negative for color change.  Neurological:  Negative for dizziness, weakness, light-headedness and numbness.    Patient History  Allergies: Allergies  Allergen Reactions   Coconut (Cocos Nucifera) Hives   Coconut Oil Hives    Patient Active Problem List  Diagnosis   S/P CABG x 3   Type 2 diabetes with complication   Essential hypertension   Chronic systolic heart failure   AICD (automatic cardioverter/defibrillator) present   Dyslipidemia (high LDL; low HDL)   Hyperlipidemia associated with type 2 diabetes mellitus   History reviewed. No  pertinent past medical history. Past Surgical History:  Procedure Laterality Date   CARDIAC SURGERY  2015   EYE SURGERY     younger   STOMACH SURGERY     when he was a baby   Social History   Socioeconomic History   Marital status: Single    Spouse name: Not on file   Number of children: Not on file   Years of education: Not on file   Highest education level: Not on file  Occupational History   Not on file  Tobacco Use   Smoking status: Never    Passive  exposure: Never   Smokeless tobacco: Never  Vaping Use   Vaping status: Never Used  Substance and Sexual Activity   Alcohol use: Never   Drug use: Never   Sexual activity: Not Currently  Other Topics Concern   Not on file  Social History Narrative   Not on file   History reviewed. No pertinent family history. Current Outpatient Medications on File Prior to Visit  Medication Sig Dispense Refill   aspirin  81 MG EC tablet Take 81 mg by mouth 1 (one) time each day.     empagliflozin (Jardiance) 10 MG Take 1 tablet (10 mg total) by mouth 1 (one) time each day. 90 tablet 0   Lancets misc 1 each by Other route in the morning and 1 each at noon and 1 each in the evening.     potassium chloride  CR (KLOR-CON ) 20 MEQ ER tablet Take 20 mEq by mouth 1 (one) time each day.     rosuvastatin  (Crestor ) 40 MG tablet Take 1 tablet (40 mg total) by mouth 1 (one) time each day. 90 tablet 0   [DISCONTINUED] carvedilol  (Coreg ) 6.25 MG tablet Take 6.25 mg by mouth in the morning and 6.25 mg in the evening. Take with meals.     [DISCONTINUED] ezetimibe  (Zetia ) 10 MG tablet Take 10 mg by mouth 1 (one) time each day.     [DISCONTINUED] furosemide  (Lasix ) 20 MG tablet Take 20 mg by mouth 1 (one) time each day.     [DISCONTINUED] metFORMIN  XR (Glucophage -XR) 500 MG 24 hr tablet Take 1 tablet (500 mg total) by mouth 1 (one) time each day. 90 tablet 0   [DISCONTINUED] sacubitril -valsartan  (Entresto ) 24-26 MG tablet Take 1 tablet by mouth 1 (one) time each day. 90 tablet 0   [DISCONTINUED] ticagrelor (Brilinta) 90 MG tablet Take 90 mg by mouth in the morning and 90 mg in the evening.     No current facility-administered medications on file prior to visit.    Objective  Vitals:   08/09/24 1128  BP: 131/86  Pulse: 86  Resp: 16  Temp: 36.7 C (98 F)  TempSrc: Oral  SpO2: 95%  Weight: 106 kg  Height: 5' 11  PainSc: 0-No pain           Body mass index is 32.59 kg/m.     No results  found.  Physical Exam Vitals and nursing note reviewed.  Constitutional:      General: He is not in acute distress.    Appearance: Normal appearance. He is not ill-appearing, toxic-appearing or diaphoretic.  HENT:     Head: Normocephalic and atraumatic.     Right Ear: External ear normal.     Left Ear: External ear normal.  Cardiovascular:     Rate and Rhythm: Normal rate and regular rhythm.     Pulses:  Dorsalis pedis pulses are 2+ on the right side and 2+ on the left side.       Posterior tibial pulses are 2+ on the right side and 2+ on the left side.     Heart sounds: No murmur heard.    No friction rub. No gallop.  Pulmonary:     Effort: Pulmonary effort is normal. No respiratory distress.     Breath sounds: Normal breath sounds. No stridor. No wheezing, rhonchi or rales.  Musculoskeletal:     Right lower leg: No edema.     Left lower leg: No edema.     Right foot: Normal range of motion. No deformity, bunion or Charcot foot.     Left foot: Normal range of motion. No deformity, bunion or Charcot foot.  Feet:     Right foot:     Protective Sensation: 9 sites tested.  9 sites sensed.     Skin integrity: No ulcer, blister, skin breakdown, erythema, callus or fissure.     Toenail Condition: Right toenails are normal.     Left foot:     Protective Sensation: 9 sites tested.  9 sites sensed.     Skin integrity: No ulcer, blister, skin breakdown, erythema, callus or fissure.     Toenail Condition: Left toenails are normal.  Skin:    General: Skin is warm and dry.     Coloration: Skin is not jaundiced or pale.  Neurological:     General: No focal deficit present.     Mental Status: He is alert.     Gait: Gait normal.  Psychiatric:        Mood and Affect: Mood normal.        Behavior: Behavior normal.        Thought Content: Thought content normal.        Judgment: Judgment normal.     Results for orders placed or performed in visit on 08/09/24  POCT Glucose,  Manually Resulted  Component Result   Glucose Blood, POC 132       Procedures MDM:     1+ Chronic illness with exacerbation, progression or side effects of treatment     Unique ordered tests: Three+     Risk:: Moderate       Assessment/Plan  Assessment & Plan Chronic systolic heart failure Patient with a history of CAD S/P CABG x 3 and HFrEF with EF ~ 30%, S/P ICD placement. He had discontinued all chronic medications approximately 1.5 year ago, including entresto , crestor , carvedilol , jardiance, lasix , brilinta, and aspirin .    At his last OV, he was restarted on Crestor , Entresto , low-dose aspirin , and jardiance.  Patient was unable to fill the estresto due to pharmacy related issues but has since restarted all other medications without adverse effects.    Carvedilol  was held due to low blood pressure, and Brilinta was held as the patient was unsure of the indication. These medications will continue to be held today, with further management deferred to cardiology.  Consideration may be given to trialing an alternative beta-blocker such as metoprolol , however, this decision will also be deferred to cardiology.  Patient has a cardiology appointment scheduled for 09/15/2023.    Today, patient denies red flag symptoms.  Entresto  was resent to the pharmacy, and the patient was advised to notify me if any further issues arise with obtaining the medication, at which point pharmacy will be contacted directly.  Patient advised to continue all other current medications and to monitor  her blood pressure closely while on Entresto .  ER precautions reviewed.    Orders:   sacubitril -valsartan  (Entresto ) 24-26 MG tablet; Take 1 tablet by mouth 1 (one) time each day.  Type 2 diabetes with complication Patient's most recent A1c was 11.3%.  At the prior visit, he was restarted on metformin , to be titrated to max dose of 1000 mg twice daily, as well as Jardiance.  Patient has been compliant with  medication regimen with no adverse effects.  He notes he has titrated metformin  to 500 mg twice daily thus far and plans to continue gradual titration to the target dose.    Patient has not been regularly monitoring blood glucose at home.  POCT glucose in the clinic today noted to be 132 mg/d.  Patient due for annual diabetic eye exam, and was provided the contact information for ophthalmology to schedule this.  A diabetic foot exam was completed and overall normal.    ACR ordered today, with results pending.  A CMP was also completed for monitoring of renal hepatic function following medication restart.    Patient advised to continue with metformin  titration and Jardiance as prescribed.  He was counseled on the importance of home blood glucose monitoring and advised to bring a glucose log to the next visit.  Patient advised to continue diet and exercise modifications.  Plan to recheck A1c in 3 months.   Orders:   COMPREHENSIVE METABOLIC PANEL   Albumin /Creatinine Ration, Random Urine   Hm Diabetes Foot Exam   POCT Glucose, Manually Resulted  S/P CABG x 3 See above care plan.     Dyslipidemia (high LDL; low HDL) Patient with a history of dyslipidemia.  Lipid panel was done at his last office visit, and LDL noted to be 141, HDL 29, triglycerides 319, and total cholesterol 229.  LDL goal of  < 55 due to cardiovascular history.  Prior visit, patient was restarted on Crestor  and is tolerating medication well with no adverse effects.  Patient to continue with Crestor  as prescribed; will also restart ezetimibe  10 mg today.  Patient to continue with diet and exercise modifications.  Plan to recheck lipids in 3 to 6 months.    Hyperlipidemia associated with type 2 diabetes mellitus See above care plan.     AICD (automatic cardioverter/defibrillator) present See above care plan.       Disposition Status: Home  Patient Instructions  -Please call [facility] and schedule annual  diabetic eye exam. P: [phone]. -Continue with all medications as prescribed. -Continue with metformin  titration to 1000 mg twice daily. -Pick up and start entresto . Keep me posted on if pharmacy continues to give you problems with this and I will call this. -Pick up and start ezetimibe .  -Recommend checking blood sugar at home and writing results down in a log. -Continue with cardiology appointment. -Follow up in 6 weeks, or sooner for concerns. Bring blood glucose log with you to visit. -If you develop any symptoms including but not limited to chest pain, shortness of breath, leg swelling, heart palpitations, vision changes, headache, extremity weakness, problems with speech, etc. call 911.     Progress note signed by Walden Phenes, PA on 08/09/24 at  4:57 PM

## 2024-08-10 ENCOUNTER — Ambulatory Visit: Payer: Self-pay | Admitting: Nurse Practitioner

## 2024-08-17 ENCOUNTER — Inpatient Hospital Stay
Admission: EM | Admit: 2024-08-17 | Discharge: 2024-08-19 | DRG: 660 | Disposition: A | Discharge status: Home / Self Care | Attending: Internal Medicine | Admitting: Internal Medicine

## 2024-08-17 ENCOUNTER — Emergency Department

## 2024-08-17 ENCOUNTER — Inpatient Hospital Stay

## 2024-08-17 ENCOUNTER — Other Ambulatory Visit: Payer: Self-pay

## 2024-08-17 DIAGNOSIS — D72829 Elevated white blood cell count, unspecified: Secondary | ICD-10-CM

## 2024-08-17 DIAGNOSIS — I2510 Atherosclerotic heart disease of native coronary artery without angina pectoris: Secondary | ICD-10-CM | POA: Diagnosis present

## 2024-08-17 DIAGNOSIS — Z7984 Long term (current) use of oral hypoglycemic drugs: Secondary | ICD-10-CM

## 2024-08-17 DIAGNOSIS — Z6834 Body mass index (BMI) 34.0-34.9, adult: Secondary | ICD-10-CM | POA: Diagnosis not present

## 2024-08-17 DIAGNOSIS — N201 Calculus of ureter: Secondary | ICD-10-CM

## 2024-08-17 DIAGNOSIS — I252 Old myocardial infarction: Secondary | ICD-10-CM

## 2024-08-17 DIAGNOSIS — E66811 Obesity, class 1: Secondary | ICD-10-CM | POA: Diagnosis present

## 2024-08-17 DIAGNOSIS — Z955 Presence of coronary angioplasty implant and graft: Secondary | ICD-10-CM

## 2024-08-17 DIAGNOSIS — Z823 Family history of stroke: Secondary | ICD-10-CM

## 2024-08-17 DIAGNOSIS — Z7982 Long term (current) use of aspirin: Secondary | ICD-10-CM | POA: Diagnosis not present

## 2024-08-17 DIAGNOSIS — I5022 Chronic systolic (congestive) heart failure: Secondary | ICD-10-CM | POA: Diagnosis present

## 2024-08-17 DIAGNOSIS — E1165 Type 2 diabetes mellitus with hyperglycemia: Secondary | ICD-10-CM | POA: Diagnosis present

## 2024-08-17 DIAGNOSIS — I255 Ischemic cardiomyopathy: Secondary | ICD-10-CM | POA: Diagnosis present

## 2024-08-17 DIAGNOSIS — N179 Acute kidney failure, unspecified: Secondary | ICD-10-CM | POA: Diagnosis present

## 2024-08-17 DIAGNOSIS — N139 Obstructive and reflux uropathy, unspecified: Principal | ICD-10-CM | POA: Diagnosis present

## 2024-08-17 DIAGNOSIS — Z951 Presence of aortocoronary bypass graft: Secondary | ICD-10-CM

## 2024-08-17 DIAGNOSIS — N136 Pyonephrosis: Secondary | ICD-10-CM | POA: Diagnosis present

## 2024-08-17 DIAGNOSIS — Z8249 Family history of ischemic heart disease and other diseases of the circulatory system: Secondary | ICD-10-CM

## 2024-08-17 DIAGNOSIS — R10A2 Flank pain, left side: Secondary | ICD-10-CM | POA: Diagnosis present

## 2024-08-17 DIAGNOSIS — E669 Obesity, unspecified: Secondary | ICD-10-CM | POA: Diagnosis not present

## 2024-08-17 DIAGNOSIS — E785 Hyperlipidemia, unspecified: Secondary | ICD-10-CM | POA: Diagnosis present

## 2024-08-17 DIAGNOSIS — Z79899 Other long term (current) drug therapy: Secondary | ICD-10-CM

## 2024-08-17 DIAGNOSIS — Z794 Long term (current) use of insulin: Secondary | ICD-10-CM

## 2024-08-17 DIAGNOSIS — Z87442 Personal history of urinary calculi: Secondary | ICD-10-CM

## 2024-08-17 DIAGNOSIS — N12 Tubulo-interstitial nephritis, not specified as acute or chronic: Secondary | ICD-10-CM

## 2024-08-17 DIAGNOSIS — R8281 Pyuria: Secondary | ICD-10-CM

## 2024-08-17 LAB — GLUCOSE, CAPILLARY
Glucose-Capillary: 292 mg/dL — ABNORMAL HIGH (ref 70–99)
Glucose-Capillary: 205 mg/dL — ABNORMAL HIGH (ref 70–99)
Glucose-Capillary: 213 mg/dL — ABNORMAL HIGH (ref 70–99)
Glucose-Capillary: 195 mg/dL — ABNORMAL HIGH (ref 70–99)

## 2024-08-17 LAB — HIV ANTIBODY (ROUTINE TESTING W REFLEX): HIV Screen 4th Generation wRfx: NONREACTIVE

## 2024-08-17 LAB — URINALYSIS, ROUTINE W REFLEX MICROSCOPIC
Bilirubin Urine: NEGATIVE
Glucose, UA: >=500 mg/dL — AB
Ketones, ur: 5 mg/dL — AB
Nitrite: NEGATIVE
Protein, ur: NEGATIVE mg/dL
Specific Gravity, Urine: 1.026 (ref 1.005–1.030)
Squamous Epithelial / HPF: 0 /HPF (ref 0–5)
pH: 6 (ref 5.0–8.0)

## 2024-08-17 LAB — CBC WITH DIFFERENTIAL/PLATELET
Abs Immature Granulocytes: 0.05 K/uL (ref 0.00–0.07)
Basophils Absolute: 0 K/uL (ref 0.0–0.1)
Basophils Relative: 0 %
Eosinophils Absolute: 0.1 K/uL (ref 0.0–0.5)
Eosinophils Relative: 0 %
HCT: 43.7 % (ref 39.0–52.0)
Hemoglobin: 15.5 g/dL (ref 13.0–17.0)
Immature Granulocytes: 0 %
Lymphocytes Relative: 10 %
Lymphs Abs: 1.2 K/uL (ref 0.7–4.0)
MCH: 30.5 pg (ref 26.0–34.0)
MCHC: 35.5 g/dL (ref 30.0–36.0)
MCV: 86 fL (ref 80.0–100.0)
Monocytes Absolute: 0.9 K/uL (ref 0.1–1.0)
Monocytes Relative: 7 %
Neutro Abs: 10.3 K/uL — ABNORMAL HIGH (ref 1.7–7.7)
Neutrophils Relative %: 83 %
Platelets: 160 K/uL (ref 150–400)
RBC: 5.08 MIL/uL (ref 4.22–5.81)
RDW: 11.7 % (ref 11.5–15.5)
WBC: 12.5 K/uL — ABNORMAL HIGH (ref 4.0–10.5)
nRBC: 0 % (ref 0.0–0.2)

## 2024-08-17 LAB — BASIC METABOLIC PANEL WITH GFR
Anion gap: 14 (ref 5–15)
BUN: 19 mg/dL (ref 8–23)
CO2: 24 mmol/L (ref 22–32)
Calcium: 9.9 mg/dL (ref 8.9–10.3)
Chloride: 97 mmol/L — ABNORMAL LOW (ref 98–111)
Creatinine, Ser: 1.22 mg/dL (ref 0.61–1.24)
GFR, Estimated: >60 mL/min
Glucose, Bld: 369 mg/dL — ABNORMAL HIGH (ref 70–99)
Potassium: 4.1 mmol/L (ref 3.5–5.1)
Sodium: 134 mmol/L — ABNORMAL LOW (ref 135–145)

## 2024-08-17 LAB — HEMOGLOBIN A1C
Hgb A1c MFr Bld: 11.3 % — ABNORMAL HIGH (ref 4.8–5.6)
Mean Plasma Glucose: 277.61 mg/dL

## 2024-08-17 MED ORDER — ONDANSETRON HCL 4 MG/2ML IJ SOLN
4.0000 mg | Freq: Four times a day (QID) | INTRAMUSCULAR | Status: DC | PRN
  Administered 2024-08-18: 4 mg via INTRAVENOUS

## 2024-08-17 MED ORDER — ACETAMINOPHEN 325 MG PO TABS
650.0000 mg | ORAL_TABLET | Freq: Four times a day (QID) | ORAL | Status: DC | PRN
  Administered 2024-08-17 – 2024-08-19 (×2): 650 mg via ORAL
  Filled 2024-08-17 (×2): qty 2

## 2024-08-17 MED ORDER — ONDANSETRON HCL 4 MG/2ML IJ SOLN
4.0000 mg | Freq: Once | INTRAMUSCULAR | Status: AC
  Administered 2024-08-17: 4 mg via INTRAVENOUS
  Filled 2024-08-17: qty 2

## 2024-08-17 MED ORDER — EZETIMIBE 10 MG PO TABS
10.0000 mg | ORAL_TABLET | Freq: Every day | ORAL | Status: DC
  Administered 2024-08-17 – 2024-08-19 (×3): 10 mg via ORAL
  Filled 2024-08-17 (×3): qty 1

## 2024-08-17 MED ORDER — ACETAMINOPHEN 650 MG RE SUPP: 650.0000 mg | Freq: Four times a day (QID) | RECTAL | Status: DC | PRN

## 2024-08-17 MED ORDER — ASPIRIN 81 MG PO TBEC
81.0000 mg | DELAYED_RELEASE_TABLET | Freq: Every day | ORAL | Status: DC
  Filled 2024-08-17: qty 1

## 2024-08-17 MED ORDER — SODIUM CHLORIDE 0.9 % IV BOLUS
500.0000 mL | Freq: Once | INTRAVENOUS | Status: AC
  Administered 2024-08-17: 500 mL via INTRAVENOUS

## 2024-08-17 MED ORDER — MORPHINE SULFATE (PF) 4 MG/ML IV SOLN
4.0000 mg | INTRAVENOUS | Status: DC | PRN
  Administered 2024-08-18 (×2): 4 mg via INTRAVENOUS
  Filled 2024-08-17 (×4): qty 1

## 2024-08-17 MED ORDER — SACUBITRIL-VALSARTAN 24-26 MG PO TABS
1.0000 | ORAL_TABLET | Freq: Two times a day (BID) | ORAL | Status: DC
  Administered 2024-08-17 – 2024-08-19 (×5): 1 via ORAL
  Filled 2024-08-17 (×5): qty 1

## 2024-08-17 MED ORDER — INSULIN ASPART 100 UNIT/ML IJ SOLN
10.0000 [IU] | Freq: Once | INTRAMUSCULAR | Status: AC
  Administered 2024-08-17: 10 [IU] via SUBCUTANEOUS
  Filled 2024-08-17: qty 10

## 2024-08-17 MED ORDER — TAMSULOSIN HCL 0.4 MG PO CAPS
0.4000 mg | ORAL_CAPSULE | Freq: Every day | ORAL | Status: DC
  Administered 2024-08-17 – 2024-08-18 (×3): 0.4 mg via ORAL
  Filled 2024-08-17 (×3): qty 1

## 2024-08-17 MED ORDER — SODIUM CHLORIDE 0.9 % IV SOLN
1.0000 g | Freq: Once | INTRAVENOUS | Status: AC
  Administered 2024-08-17: 1 g via INTRAVENOUS
  Filled 2024-08-17: qty 10

## 2024-08-17 MED ORDER — ROSUVASTATIN CALCIUM 20 MG PO TABS
40.0000 mg | ORAL_TABLET | Freq: Every day | ORAL | Status: DC
  Administered 2024-08-17 – 2024-08-19 (×3): 40 mg via ORAL
  Filled 2024-08-17 (×3): qty 2

## 2024-08-17 MED ORDER — ASPIRIN 81 MG PO TBEC: 81.0000 mg | DELAYED_RELEASE_TABLET | Freq: Every day | ORAL | Status: DC

## 2024-08-17 MED ORDER — KETOROLAC TROMETHAMINE 15 MG/ML IJ SOLN
15.0000 mg | Freq: Once | INTRAMUSCULAR | Status: AC
  Administered 2024-08-17: 15 mg via INTRAVENOUS
  Filled 2024-08-17: qty 1

## 2024-08-17 MED ORDER — INSULIN ASPART 100 UNIT/ML IJ SOLN
0.0000 [IU] | Freq: Three times a day (TID) | INTRAMUSCULAR | Status: DC
  Administered 2024-08-17 (×2): 5 [IU] via SUBCUTANEOUS
  Administered 2024-08-18: 3 [IU] via SUBCUTANEOUS
  Administered 2024-08-18: 8 [IU] via SUBCUTANEOUS
  Administered 2024-08-18 – 2024-08-19 (×2): 5 [IU] via SUBCUTANEOUS
  Filled 2024-08-17 (×4): qty 5
  Filled 2024-08-17: qty 3
  Filled 2024-08-17: qty 8

## 2024-08-17 MED ORDER — ZOLPIDEM TARTRATE 5 MG PO TABS: 5.0000 mg | ORAL_TABLET | Freq: Every evening | ORAL | Status: DC | PRN

## 2024-08-17 MED ORDER — ONDANSETRON HCL 4 MG PO TABS: 4.0000 mg | ORAL_TABLET | Freq: Four times a day (QID) | ORAL | Status: DC | PRN

## 2024-08-17 MED ORDER — SODIUM CHLORIDE 0.9 % IV SOLN
1.0000 g | INTRAVENOUS | Status: DC
  Administered 2024-08-17 – 2024-08-19 (×3): 1 g via INTRAVENOUS
  Filled 2024-08-17 (×3): qty 10

## 2024-08-17 NOTE — Consult Note (Signed)
 "   Urology Consult  I have been asked to see the patient by Dr. Laurita, for evaluation and management of left-sided obstructive uropathy with possible UTI.  Chief Complaint: Left flank pain  History of Present Illness: Seth Riddle is a 63 y.o. year old male with PMH CAD, diabetes, and nephrolithiasis admitted overnight with renal colic due to a 10 mm left UVJ stone with pyuria.  CT stone study on presentation notable for an obstructing 7 x 11 mm left UVJ stone with moderate left hydronephrosis and left perinephric stranding.  There is a nonobstructing 3 mm left lower pole stone and multiple nonobstructing right lower pole stones.   Admission labs notable for UA with no nitrites, 21-50 RBC/hpf, 11-20 WBC/hpf, and rare bacteria; white count 12.5; and creatinine 1.22 (baseline 1.1).  He has been afebrile, VSS.  Urine culture pending, on antibiotics as below.  He is accompanied today by his parents at the bedside.  He reports that his pain has resolved.  He has no acute concerns today.  He is known to our practice, having previously been a patient of Dr. Penne and undergone ureteroscopy with her in 2018.  Anti-infectives (From admission, onward)    Start     Dose/Rate Route Frequency Ordered Stop   08/17/24 1000  cefTRIAXone  (ROCEPHIN ) 1 g in sodium chloride  0.9 % 100 mL IVPB        1 g 200 mL/hr over 30 Minutes Intravenous Every 24 hours 08/17/24 0912     08/17/24 0430  cefTRIAXone  (ROCEPHIN ) 1 g in sodium chloride  0.9 % 100 mL IVPB        1 g 200 mL/hr over 30 Minutes Intravenous  Once 08/17/24 0417 08/17/24 0507        Past Medical History:  Diagnosis Date   Coronary artery disease    Diabetes mellitus without complication (HCC)    Dyslipidemia (high LDL; low HDL)    Family history of premature CAD    Headache    History of kidney stones    Low HDL (under 40)    MI (myocardial infarction) (HCC) 2016   PONV (postoperative nausea and vomiting)    as a child eye surgery     Past Surgical History:  Procedure Laterality Date   CORONARY ARTERY BYPASS GRAFT N/A 11/15/2014   Procedure: CORONARY ARTERY BYPASS GRAFTING (CABG), ON PUMP, TIMES THREE, USING LEFT INTERNAL MAMMARY ARTERY, RIGHT GREATER SAPHENOUS VEIN HARVESTED ENDOSCOPICALLY;  Surgeon: Elspeth JAYSON Millers, MD;  Location: [facility] OR;  Service: Open Heart Surgery;  Laterality: N/A;   CYSTOSCOPY W/ URETERAL STENT PLACEMENT Right 02/04/2017   Procedure: CYSTOSCOPY WITH STENT REPLACEMENT;  Surgeon: Penne Knee, MD;  Location: [facility] ORS;  Service: Urology;  Laterality: Right;   CYSTOSCOPY W/ URETERAL STENT REMOVAL Left 02/04/2017   Procedure: CYSTOSCOPY WITH STENT REMOVAL;  Surgeon: Penne Knee, MD;  Location: [facility] ORS;  Service: Urology;  Laterality: Left;   CYSTOSCOPY WITH STENT PLACEMENT Bilateral 01/17/2017   Procedure: CYSTOSCOPY WITH STENT PLACEMENT;  Surgeon: Penne Knee, MD;  Location: [facility] ORS;  Service: Urology;  Laterality: Bilateral;   EYE SURGERY     multiple eye surgeries as a child   ICD IMPLANT N/A 12/03/2022   Procedure: ICD IMPLANT;  Surgeon: Cindie Ole DASEN, MD;  Location: [facility] INVASIVE CV LAB;  Service: Cardiovascular;  Laterality: N/A;   INTRA-AORTIC BALLOON PUMP INSERTION N/A 11/15/2014   Procedure: INTRA-AORTIC BALLOON PUMP INSERTION;  Surgeon: Debby DELENA Sor, MD;  Location: [facility] CATH LAB;  Service: Cardiovascular;  Laterality: N/A;   LEFT HEART CATH AND CORONARY ANGIOGRAPHY N/A 07/10/2021   Procedure: LEFT HEART CATH AND CORONARY ANGIOGRAPHY;  Surgeon: Mady Bruckner, MD;  Location: [facility] INVASIVE CV LAB;  Service: Cardiovascular;  Laterality: N/A;   LEFT HEART CATHETERIZATION WITH CORONARY ANGIOGRAM N/A 11/14/2014   Procedure: LEFT HEART CATHETERIZATION WITH CORONARY ANGIOGRAM;  Surgeon: Debby DELENA Sor, MD;  Location: [facility] CATH LAB;  Service: Cardiovascular;  Laterality: N/A;   PERCUTANEOUS CORONARY STENT INTERVENTION (PCI-S) N/A 11/15/2014   Procedure: PERCUTANEOUS CORONARY STENT  INTERVENTION (PCI-S);  Surgeon: Debby DELENA Sor, MD;  Location: [facility] CATH LAB;  Service: Cardiovascular;  Laterality: N/A;   STOMACH SURGERY     at 54 weeks old   TEE WITHOUT CARDIOVERSION N/A 11/15/2014   Procedure: TRANSESOPHAGEAL ECHOCARDIOGRAM (TEE);  Surgeon: Elspeth JAYSON Millers, MD;  Location: [facility] OR;  Service: Open Heart Surgery;  Laterality: N/A;   URETEROSCOPY WITH HOLMIUM LASER LITHOTRIPSY Bilateral 01/17/2017   Procedure: URETEROSCOPY WITH HOLMIUM LASER LITHOTRIPSY;  Surgeon: Penne Knee, MD;  Location: [facility] ORS;  Service: Urology;  Laterality: Bilateral;   URETEROSCOPY WITH HOLMIUM LASER LITHOTRIPSY Right 02/04/2017   Procedure: URETEROSCOPY WITH HOLMIUM LASER LITHOTRIPSY;  Surgeon: Penne Knee, MD;  Location: [facility] ORS;  Service: Urology;  Laterality: Right;    Home Medications:  Active Medications[1]  Allergies: Allergies[2]  Family History  Problem Relation Age of Onset   Heart disease Mother    Stroke Mother    Nephrolithiasis Mother    Heart attack Father    Stroke Father    Kidney cancer Neg Hx    Bladder Cancer Neg Hx    Prostate cancer Neg Hx     Social History:  reports that he has never smoked. He has never used smokeless tobacco. He reports current alcohol use. He reports that he does not use drugs.  ROS: A complete review of systems was performed.  All systems are negative except for pertinent findings as noted.  Physical Exam:  Vital signs in last 24 hours: Temp:  [98.2 F (36.8 C)-98.8 F (37.1 C)] 98.2 F (36.8 C) (12/30 0914) Pulse Rate:  [70-84] 70 (12/30 0914) Resp:  [17-18] 18 (12/30 0914) BP: (97-148)/(57-95) 97/57 (12/30 0914) SpO2:  [95 %-96 %] 96 % (12/30 0914) Constitutional:  Alert and oriented, no acute distress HEENT: [place] AT, moist mucus membranes Cardiovascular: No clubbing, cyanosis, or edema Respiratory: Normal respiratory effort Skin: No rashes, bruises or suspicious lesions Neurologic: Grossly intact, no focal deficits, moving  all 4 extremities Psychiatric: Normal mood and affect  Laboratory Data:  Recent Labs    08/17/24 0231  WBC 12.5*  HGB 15.5  HCT 43.7   Recent Labs    08/17/24 0231  NA 134*  K 4.1  CL 97*  CO2 24  GLUCOSE 369*  BUN 19  CREATININE 1.22  CALCIUM  9.9   Urinalysis    Component Value Date/Time   COLORURINE YELLOW (A) 08/17/2024 0231   APPEARANCEUR CLEAR (A) 08/17/2024 0231   APPEARANCEUR Cloudy (A) 02/21/2017 1342   LABSPEC 1.026 08/17/2024 0231   PHURINE 6.0 08/17/2024 0231   GLUCOSEU >=500 (A) 08/17/2024 0231   HGBUR SMALL (A) 08/17/2024 0231   BILIRUBINUR NEGATIVE 08/17/2024 0231   BILIRUBINUR Negative 02/21/2017 1342   KETONESUR 5 (A) 08/17/2024 0231   PROTEINUR NEGATIVE 08/17/2024 0231   UROBILINOGEN 1.0 11/15/2014 0612   NITRITE NEGATIVE 08/17/2024 0231   LEUKOCYTESUR TRACE (A) 08/17/2024 0231   Results for orders placed or performed during the  hospital encounter of 07/09/21  Resp Panel by RT-PCR (Flu A&B, Covid) Nasopharyngeal Swab     Status: None   Collection Time: 07/09/21  8:37 PM   Specimen: Nasopharyngeal Swab; Nasopharyngeal(NP) swabs  in vial transport medium  Result Value Ref Range Status   SARS Coronavirus 2 by RT PCR NEGATIVE NEGATIVE Final    Comment: (NOTE) SARS-CoV-2 target nucleic acids are NOT DETECTED.  The SARS-CoV-2 RNA is generally detectable in upper respiratory specimens during the acute phase of infection. The lowest concentration of SARS-CoV-2 viral copies this assay can detect is 138 copies/mL. A negative result does not preclude SARS-Cov-2 infection and should not be used as the sole basis for treatment or other patient management decisions. A negative result may occur with  improper specimen collection/handling, submission of specimen other than nasopharyngeal swab, presence of viral mutation(s) within the areas targeted by this assay, and inadequate number of viral copies(<138 copies/mL). A negative result must be combined  with clinical observations, patient history, and epidemiological information. The expected result is Negative.  Fact Sheet for Patients:  bloggercourse.com  Fact Sheet for Healthcare Providers:  seriousbroker.it  This test is no t yet approved or cleared by the [place]  FDA and  has been authorized for detection and/or diagnosis of SARS-CoV-2 by FDA under an Emergency Use Authorization (EUA). This EUA will remain  in effect (meaning this test can be used) for the duration of the COVID-19 declaration under Section 564(b)(1) of the Act, 21 U.S.C.section 360bbb-3(b)(1), unless the authorization is terminated  or revoked sooner.       Influenza A by PCR NEGATIVE NEGATIVE Final   Influenza B by PCR NEGATIVE NEGATIVE Final    Comment: (NOTE) The Xpert Xpress SARS-CoV-2/FLU/RSV plus assay is intended as an aid in the diagnosis of influenza from Nasopharyngeal swab specimens and should not be used as a sole basis for treatment. Nasal washings and aspirates are unacceptable for Xpert Xpress SARS-CoV-2/FLU/RSV testing.  Fact Sheet for Patients: bloggercourse.com  Fact Sheet for Healthcare Providers: seriousbroker.it  This test is not yet approved or cleared by the [place]  FDA and has been authorized for detection and/or diagnosis of SARS-CoV-2 by FDA under an Emergency Use Authorization (EUA). This EUA will remain in effect (meaning this test can be used) for the duration of the COVID-19 declaration under Section 564(b)(1) of the Act, 21 U.S.C. section 360bbb-3(b)(1), unless the authorization is terminated or revoked.  Performed at [facility], [address], [place], [place] [address]     Radiologic Imaging: DG Chest 1 View Result Date: 08/17/2024 EXAM: PA AND LATERAL (2) VIEW(S) XRAY OF THE CHEST 08/17/2024 08:00:00 AM COMPARISON: PA and lateral  radiographs of the chest dated 12/03/2022. CLINICAL HISTORY: CHF (congestive heart failure) (HCC) FINDINGS: LINES, TUBES AND DEVICES: Left chest AICD in place. LUNGS AND PLEURA: No focal pulmonary opacity. No pleural effusion. No pneumothorax. HEART AND MEDIASTINUM: Mild cardiomegaly. CABG markers noted. Left chest AICD in place. BONES AND SOFT TISSUES: Nondisplaced median sternotomy wires. No acute osseous abnormality. IMPRESSION: 1. Mild cardiomegaly with left chest AICD in place, CABG markers noted, and nondisplaced median sternotomy wires. Electronically signed by: Evalene Coho MD 08/17/2024 08:20 AM EST RP Workstation: [id number]   CT Renal Stone Study Result Date: 08/17/2024 EXAM: CT UROGRAM 08/17/2024 03:10:20 AM TECHNIQUE: CT of the abdomen and pelvis was performed without the administration of intravenous contrast as per CT urogram protocol. Multiplanar reformatted images as well as MIP urogram images are provided for review. Automated exposure  control, iterative reconstruction, and/or weight based adjustment of the mA/kV was utilized to reduce the radiation dose to as low as reasonably achievable. COMPARISON: 01/15/2017 CLINICAL HISTORY: Left flank pain, nephrolithiasis. FINDINGS: LOWER CHEST: Cardiac pacer leads noted. LIVER: The liver is unremarkable. GALLBLADDER AND BILE DUCTS: Cholelithiasis without superimposed pericholecystic inflammatory change. No intra or extrahepatic biliary ductal dilation. SPLEEN: No acute abnormality. PANCREAS: No acute abnormality. ADRENAL GLANDS: No acute abnormality. KIDNEYS, URETERS AND BLADDER: The kidneys are normal in size and position. Moderate left hydronephrosis and marked perinephric stranding secondary to an obstructing 7 x 11 mm calculus at the left ureterovesicular junction protruding into the lateral lumen. Extensive perinephric stranding may relate to pyelonephritis or rupture, superimposed infection could appear similarly and correlation with  urinalysis and urine culture is recommended for further management. Superimposed 3 mm nonobstructing calculus within the lower pole of the left kidney. Conglomerate nonobstructing calculi within the lower pole of the right kidney measuring up to 14 mm. No ureteral calculi in the right. No hydronephrosis on the right. The bladder is otherwise unremarkable. GI AND BOWEL: Mild sigmoid diverticulosis without superimposed acute inflammatory change. The stomach, small bowel, and large bowel are otherwise unremarkable. Appendix normal. PERITONEUM AND RETROPERITONEUM: No ascites. No free air. VASCULATURE: Mild aortoiliac atherosclerotic calcification. No aortic aneurysm. LYMPH NODES: No lymphadenopathy. REPRODUCTIVE ORGANS: No acute abnormality. BONES AND SOFT TISSUES: Osseous structures are age appropriate. No acute bone abnormality. No lytic or blastic bone lesion. No focal soft tissue abnormality. IMPRESSION: 1. Obstructing 7 x 11 mm calculus at the left ureterovesicular junction causing moderate left hydronephrosis and marked perinephric stranding, possibly related to forniceal rupture or superimposed infection; recommend urinalysis and urine culture for further management. 2. Nonobstructing 3 mm calculus within the lower pole of the left kidney. 3. Conglomerate nonobstructing calculi within the lower pole of the right kidney measuring up to 14 mm, without right hydronephrosis or right ureteral calculi. 4. Cholelithiasis. 5. Mild sigmoid diverticulosis. Electronically signed by: Dorethia Molt MD 08/17/2024 04:38 AM EST RP Workstation: [id number]   Assessment & Plan:  63 y.o. male with PMH CAD, diabetes, and nephrolithiasis currently admitted with an obstructing 7 x 11 mm left UVJ stone with pyuria and leukocytosis.  He is comfortable today, no indication for urgent intervention at this time.  In the absence of fevers, low suspicion for systemic infection.    I offered him left ureteroscopy with laser lithotripsy  and stent placement with Dr. Twylla tomorrow and he agreed.  We discussed that that if there are any intraoperative signs of infection, no attempt will be made to treat his stone and Dr. Twylla will only place a ureteral stent to allow for urinary decompression and source control of any infection.  He expressed understanding.  Recommendations: - Okay for diet today from the urologic perspective - Continue IV antibiotics, supportive care, and pain control per primary team - N.p.o. at midnight tonight, will plan for left ureteroscopy with laser lithotripsy and stent placement with Dr. Twylla tomorrow - Please contact urology if he spontaneously passes the stone prior to scheduled procedure  Thank you for involving me in this patient's care, I will continue to follow along.  Wynne Rozak, PA-C 08/17/2024 1:10 PM      [1]  Current Meds  Medication Sig   aspirin  EC 81 MG tablet Take 1 tablet (81 mg total) by mouth daily.   ezetimibe  (ZETIA ) 10 MG tablet TAKE 1 TABLET BY MOUTH EVERY DAY   JARDIANCE 10 MG TABS tablet  Take 10 mg by mouth daily.   metFORMIN  (GLUCOPHAGE -XR) 500 MG 24 hr tablet Take 1-2 tablets by mouth 2 (two) times daily. Take 2 tablet by mouth in the morning and 1 tablet by mouth in the evening   rosuvastatin  (CRESTOR ) 40 MG tablet TAKE 1 TABLET BY MOUTH EVERY DAY   sacubitril -valsartan  (ENTRESTO ) 24-26 MG Take 1 tablet by mouth 2 (two) times daily.  [2]  Allergies Allergen Reactions   Coconut (Cocos Nucifera) Hives   "

## 2024-08-17 NOTE — H&P (View-Only) (Signed)
 "   Urology Consult  I have been asked to see the patient by Dr. Laurita, for evaluation and management of left-sided obstructive uropathy with possible UTI.  Chief Complaint: Left flank pain  History of Present Illness: Seth Riddle is a 63 y.o. year old male with PMH CAD, diabetes, and nephrolithiasis admitted overnight with renal colic due to a 10 mm left UVJ stone with pyuria.  CT stone study on presentation notable for an obstructing 7 x 11 mm left UVJ stone with moderate left hydronephrosis and left perinephric stranding.  There is a nonobstructing 3 mm left lower pole stone and multiple nonobstructing right lower pole stones.   Admission labs notable for UA with no nitrites, 21-50 RBC/hpf, 11-20 WBC/hpf, and rare bacteria; white count 12.5; and creatinine 1.22 (baseline 1.1).  He has been afebrile, VSS.  Urine culture pending, on antibiotics as below.  He is accompanied today by his parents at the bedside.  He reports that his pain has resolved.  He has no acute concerns today.  He is known to our practice, having previously been a patient of Dr. Penne and undergone ureteroscopy with her in 2018.  Anti-infectives (From admission, onward)    Start     Dose/Rate Route Frequency Ordered Stop   08/17/24 1000  cefTRIAXone  (ROCEPHIN ) 1 g in sodium chloride  0.9 % 100 mL IVPB        1 g 200 mL/hr over 30 Minutes Intravenous Every 24 hours 08/17/24 0912     08/17/24 0430  cefTRIAXone  (ROCEPHIN ) 1 g in sodium chloride  0.9 % 100 mL IVPB        1 g 200 mL/hr over 30 Minutes Intravenous  Once 08/17/24 0417 08/17/24 0507        Past Medical History:  Diagnosis Date   Coronary artery disease    Diabetes mellitus without complication (HCC)    Dyslipidemia (high LDL; low HDL)    Family history of premature CAD    Headache    History of kidney stones    Low HDL (under 40)    MI (myocardial infarction) (HCC) 2016   PONV (postoperative nausea and vomiting)    as a child eye surgery     Past Surgical History:  Procedure Laterality Date   CORONARY ARTERY BYPASS GRAFT N/A 11/15/2014   Procedure: CORONARY ARTERY BYPASS GRAFTING (CABG), ON PUMP, TIMES THREE, USING LEFT INTERNAL MAMMARY ARTERY, RIGHT GREATER SAPHENOUS VEIN HARVESTED ENDOSCOPICALLY;  Surgeon: Elspeth JAYSON Millers, MD;  Location: [facility] OR;  Service: Open Heart Surgery;  Laterality: N/A;   CYSTOSCOPY W/ URETERAL STENT PLACEMENT Right 02/04/2017   Procedure: CYSTOSCOPY WITH STENT REPLACEMENT;  Surgeon: Penne Knee, MD;  Location: [facility] ORS;  Service: Urology;  Laterality: Right;   CYSTOSCOPY W/ URETERAL STENT REMOVAL Left 02/04/2017   Procedure: CYSTOSCOPY WITH STENT REMOVAL;  Surgeon: Penne Knee, MD;  Location: [facility] ORS;  Service: Urology;  Laterality: Left;   CYSTOSCOPY WITH STENT PLACEMENT Bilateral 01/17/2017   Procedure: CYSTOSCOPY WITH STENT PLACEMENT;  Surgeon: Penne Knee, MD;  Location: [facility] ORS;  Service: Urology;  Laterality: Bilateral;   EYE SURGERY     multiple eye surgeries as a child   ICD IMPLANT N/A 12/03/2022   Procedure: ICD IMPLANT;  Surgeon: Cindie Ole DASEN, MD;  Location: [facility] INVASIVE CV LAB;  Service: Cardiovascular;  Laterality: N/A;   INTRA-AORTIC BALLOON PUMP INSERTION N/A 11/15/2014   Procedure: INTRA-AORTIC BALLOON PUMP INSERTION;  Surgeon: Debby DELENA Sor, MD;  Location: [facility] CATH LAB;  Service: Cardiovascular;  Laterality: N/A;   LEFT HEART CATH AND CORONARY ANGIOGRAPHY N/A 07/10/2021   Procedure: LEFT HEART CATH AND CORONARY ANGIOGRAPHY;  Surgeon: Mady Bruckner, MD;  Location: [facility] INVASIVE CV LAB;  Service: Cardiovascular;  Laterality: N/A;   LEFT HEART CATHETERIZATION WITH CORONARY ANGIOGRAM N/A 11/14/2014   Procedure: LEFT HEART CATHETERIZATION WITH CORONARY ANGIOGRAM;  Surgeon: Debby DELENA Sor, MD;  Location: [facility] CATH LAB;  Service: Cardiovascular;  Laterality: N/A;   PERCUTANEOUS CORONARY STENT INTERVENTION (PCI-S) N/A 11/15/2014   Procedure: PERCUTANEOUS CORONARY STENT  INTERVENTION (PCI-S);  Surgeon: Debby DELENA Sor, MD;  Location: [facility] CATH LAB;  Service: Cardiovascular;  Laterality: N/A;   STOMACH SURGERY     at 54 weeks old   TEE WITHOUT CARDIOVERSION N/A 11/15/2014   Procedure: TRANSESOPHAGEAL ECHOCARDIOGRAM (TEE);  Surgeon: Elspeth JAYSON Millers, MD;  Location: [facility] OR;  Service: Open Heart Surgery;  Laterality: N/A;   URETEROSCOPY WITH HOLMIUM LASER LITHOTRIPSY Bilateral 01/17/2017   Procedure: URETEROSCOPY WITH HOLMIUM LASER LITHOTRIPSY;  Surgeon: Penne Knee, MD;  Location: [facility] ORS;  Service: Urology;  Laterality: Bilateral;   URETEROSCOPY WITH HOLMIUM LASER LITHOTRIPSY Right 02/04/2017   Procedure: URETEROSCOPY WITH HOLMIUM LASER LITHOTRIPSY;  Surgeon: Penne Knee, MD;  Location: [facility] ORS;  Service: Urology;  Laterality: Right;    Home Medications:  Active Medications[1]  Allergies: Allergies[2]  Family History  Problem Relation Age of Onset   Heart disease Mother    Stroke Mother    Nephrolithiasis Mother    Heart attack Father    Stroke Father    Kidney cancer Neg Hx    Bladder Cancer Neg Hx    Prostate cancer Neg Hx     Social History:  reports that he has never smoked. He has never used smokeless tobacco. He reports current alcohol use. He reports that he does not use drugs.  ROS: A complete review of systems was performed.  All systems are negative except for pertinent findings as noted.  Physical Exam:  Vital signs in last 24 hours: Temp:  [98.2 F (36.8 C)-98.8 F (37.1 C)] 98.2 F (36.8 C) (12/30 0914) Pulse Rate:  [70-84] 70 (12/30 0914) Resp:  [17-18] 18 (12/30 0914) BP: (97-148)/(57-95) 97/57 (12/30 0914) SpO2:  [95 %-96 %] 96 % (12/30 0914) Constitutional:  Alert and oriented, no acute distress HEENT: [place] AT, moist mucus membranes Cardiovascular: No clubbing, cyanosis, or edema Respiratory: Normal respiratory effort Skin: No rashes, bruises or suspicious lesions Neurologic: Grossly intact, no focal deficits, moving  all 4 extremities Psychiatric: Normal mood and affect  Laboratory Data:  Recent Labs    08/17/24 0231  WBC 12.5*  HGB 15.5  HCT 43.7   Recent Labs    08/17/24 0231  NA 134*  K 4.1  CL 97*  CO2 24  GLUCOSE 369*  BUN 19  CREATININE 1.22  CALCIUM  9.9   Urinalysis    Component Value Date/Time   COLORURINE YELLOW (A) 08/17/2024 0231   APPEARANCEUR CLEAR (A) 08/17/2024 0231   APPEARANCEUR Cloudy (A) 02/21/2017 1342   LABSPEC 1.026 08/17/2024 0231   PHURINE 6.0 08/17/2024 0231   GLUCOSEU >=500 (A) 08/17/2024 0231   HGBUR SMALL (A) 08/17/2024 0231   BILIRUBINUR NEGATIVE 08/17/2024 0231   BILIRUBINUR Negative 02/21/2017 1342   KETONESUR 5 (A) 08/17/2024 0231   PROTEINUR NEGATIVE 08/17/2024 0231   UROBILINOGEN 1.0 11/15/2014 0612   NITRITE NEGATIVE 08/17/2024 0231   LEUKOCYTESUR TRACE (A) 08/17/2024 0231   Results for orders placed or performed during the  hospital encounter of 07/09/21  Resp Panel by RT-PCR (Flu A&B, Covid) Nasopharyngeal Swab     Status: None   Collection Time: 07/09/21  8:37 PM   Specimen: Nasopharyngeal Swab; Nasopharyngeal(NP) swabs  in vial transport medium  Result Value Ref Range Status   SARS Coronavirus 2 by RT PCR NEGATIVE NEGATIVE Final    Comment: (NOTE) SARS-CoV-2 target nucleic acids are NOT DETECTED.  The SARS-CoV-2 RNA is generally detectable in upper respiratory specimens during the acute phase of infection. The lowest concentration of SARS-CoV-2 viral copies this assay can detect is 138 copies/mL. A negative result does not preclude SARS-Cov-2 infection and should not be used as the sole basis for treatment or other patient management decisions. A negative result may occur with  improper specimen collection/handling, submission of specimen other than nasopharyngeal swab, presence of viral mutation(s) within the areas targeted by this assay, and inadequate number of viral copies(<138 copies/mL). A negative result must be combined  with clinical observations, patient history, and epidemiological information. The expected result is Negative.  Fact Sheet for Patients:  bloggercourse.com  Fact Sheet for Healthcare Providers:  seriousbroker.it  This test is no t yet approved or cleared by the [place]  FDA and  has been authorized for detection and/or diagnosis of SARS-CoV-2 by FDA under an Emergency Use Authorization (EUA). This EUA will remain  in effect (meaning this test can be used) for the duration of the COVID-19 declaration under Section 564(b)(1) of the Act, 21 U.S.C.section 360bbb-3(b)(1), unless the authorization is terminated  or revoked sooner.       Influenza A by PCR NEGATIVE NEGATIVE Final   Influenza B by PCR NEGATIVE NEGATIVE Final    Comment: (NOTE) The Xpert Xpress SARS-CoV-2/FLU/RSV plus assay is intended as an aid in the diagnosis of influenza from Nasopharyngeal swab specimens and should not be used as a sole basis for treatment. Nasal washings and aspirates are unacceptable for Xpert Xpress SARS-CoV-2/FLU/RSV testing.  Fact Sheet for Patients: bloggercourse.com  Fact Sheet for Healthcare Providers: seriousbroker.it  This test is not yet approved or cleared by the [place]  FDA and has been authorized for detection and/or diagnosis of SARS-CoV-2 by FDA under an Emergency Use Authorization (EUA). This EUA will remain in effect (meaning this test can be used) for the duration of the COVID-19 declaration under Section 564(b)(1) of the Act, 21 U.S.C. section 360bbb-3(b)(1), unless the authorization is terminated or revoked.  Performed at [facility], [address], [place], [place] [address]     Radiologic Imaging: DG Chest 1 View Result Date: 08/17/2024 EXAM: PA AND LATERAL (2) VIEW(S) XRAY OF THE CHEST 08/17/2024 08:00:00 AM COMPARISON: PA and lateral  radiographs of the chest dated 12/03/2022. CLINICAL HISTORY: CHF (congestive heart failure) (HCC) FINDINGS: LINES, TUBES AND DEVICES: Left chest AICD in place. LUNGS AND PLEURA: No focal pulmonary opacity. No pleural effusion. No pneumothorax. HEART AND MEDIASTINUM: Mild cardiomegaly. CABG markers noted. Left chest AICD in place. BONES AND SOFT TISSUES: Nondisplaced median sternotomy wires. No acute osseous abnormality. IMPRESSION: 1. Mild cardiomegaly with left chest AICD in place, CABG markers noted, and nondisplaced median sternotomy wires. Electronically signed by: Evalene Coho MD 08/17/2024 08:20 AM EST RP Workstation: [id number]   CT Renal Stone Study Result Date: 08/17/2024 EXAM: CT UROGRAM 08/17/2024 03:10:20 AM TECHNIQUE: CT of the abdomen and pelvis was performed without the administration of intravenous contrast as per CT urogram protocol. Multiplanar reformatted images as well as MIP urogram images are provided for review. Automated exposure  control, iterative reconstruction, and/or weight based adjustment of the mA/kV was utilized to reduce the radiation dose to as low as reasonably achievable. COMPARISON: 01/15/2017 CLINICAL HISTORY: Left flank pain, nephrolithiasis. FINDINGS: LOWER CHEST: Cardiac pacer leads noted. LIVER: The liver is unremarkable. GALLBLADDER AND BILE DUCTS: Cholelithiasis without superimposed pericholecystic inflammatory change. No intra or extrahepatic biliary ductal dilation. SPLEEN: No acute abnormality. PANCREAS: No acute abnormality. ADRENAL GLANDS: No acute abnormality. KIDNEYS, URETERS AND BLADDER: The kidneys are normal in size and position. Moderate left hydronephrosis and marked perinephric stranding secondary to an obstructing 7 x 11 mm calculus at the left ureterovesicular junction protruding into the lateral lumen. Extensive perinephric stranding may relate to pyelonephritis or rupture, superimposed infection could appear similarly and correlation with  urinalysis and urine culture is recommended for further management. Superimposed 3 mm nonobstructing calculus within the lower pole of the left kidney. Conglomerate nonobstructing calculi within the lower pole of the right kidney measuring up to 14 mm. No ureteral calculi in the right. No hydronephrosis on the right. The bladder is otherwise unremarkable. GI AND BOWEL: Mild sigmoid diverticulosis without superimposed acute inflammatory change. The stomach, small bowel, and large bowel are otherwise unremarkable. Appendix normal. PERITONEUM AND RETROPERITONEUM: No ascites. No free air. VASCULATURE: Mild aortoiliac atherosclerotic calcification. No aortic aneurysm. LYMPH NODES: No lymphadenopathy. REPRODUCTIVE ORGANS: No acute abnormality. BONES AND SOFT TISSUES: Osseous structures are age appropriate. No acute bone abnormality. No lytic or blastic bone lesion. No focal soft tissue abnormality. IMPRESSION: 1. Obstructing 7 x 11 mm calculus at the left ureterovesicular junction causing moderate left hydronephrosis and marked perinephric stranding, possibly related to forniceal rupture or superimposed infection; recommend urinalysis and urine culture for further management. 2. Nonobstructing 3 mm calculus within the lower pole of the left kidney. 3. Conglomerate nonobstructing calculi within the lower pole of the right kidney measuring up to 14 mm, without right hydronephrosis or right ureteral calculi. 4. Cholelithiasis. 5. Mild sigmoid diverticulosis. Electronically signed by: Dorethia Molt MD 08/17/2024 04:38 AM EST RP Workstation: [id number]   Assessment & Plan:  63 y.o. male with PMH CAD, diabetes, and nephrolithiasis currently admitted with an obstructing 7 x 11 mm left UVJ stone with pyuria and leukocytosis.  He is comfortable today, no indication for urgent intervention at this time.  In the absence of fevers, low suspicion for systemic infection.    I offered him left ureteroscopy with laser lithotripsy  and stent placement with Dr. Twylla tomorrow and he agreed.  We discussed that that if there are any intraoperative signs of infection, no attempt will be made to treat his stone and Dr. Twylla will only place a ureteral stent to allow for urinary decompression and source control of any infection.  He expressed understanding.  Recommendations: - Okay for diet today from the urologic perspective - Continue IV antibiotics, supportive care, and pain control per primary team - N.p.o. at midnight tonight, will plan for left ureteroscopy with laser lithotripsy and stent placement with Dr. Twylla tomorrow - Please contact urology if he spontaneously passes the stone prior to scheduled procedure  Thank you for involving me in this patient's care, I will continue to follow along.  Wynne Rozak, PA-C 08/17/2024 1:10 PM      [1]  Current Meds  Medication Sig   aspirin  EC 81 MG tablet Take 1 tablet (81 mg total) by mouth daily.   ezetimibe  (ZETIA ) 10 MG tablet TAKE 1 TABLET BY MOUTH EVERY DAY   JARDIANCE 10 MG TABS tablet  Take 10 mg by mouth daily.   metFORMIN  (GLUCOPHAGE -XR) 500 MG 24 hr tablet Take 1-2 tablets by mouth 2 (two) times daily. Take 2 tablet by mouth in the morning and 1 tablet by mouth in the evening   rosuvastatin  (CRESTOR ) 40 MG tablet TAKE 1 TABLET BY MOUTH EVERY DAY   sacubitril -valsartan  (ENTRESTO ) 24-26 MG Take 1 tablet by mouth 2 (two) times daily.  [2]  Allergies Allergen Reactions   Coconut (Cocos Nucifera) Hives   "

## 2024-08-17 NOTE — ED Provider Notes (Signed)
 "  [facility] Provider Note    Event Date/Time   First MD Initiated Contact with Patient 08/17/24 [phone]     (approximate)   History   Flank Pain   HPI  Seth Riddle is a 63 y.o. male   Past medical history of recurrent kidney stones in the past requiring lithotripsy, diabetes, CAD, dyslipidemia, here with acute onset left lower quadrant pain around 11 PM while resting last night.  No trauma no obvious inciting event.  No associated urinary symptoms and was in his regular state of health otherwise prior.  9 out of 10 pain.  Waxing and waning.  Radiating toward groin     Independent Historian contributed to assessment above: His family members are at bedside to corroborate information above  External Medical Documents Reviewed: Previous outpatient notes      Physical Exam   Triage Vital Signs: ED Triage Vitals [08/17/24 0229]  Encounter Vitals Group     BP (!) 148/95     Girls Systolic BP Percentile      Girls Diastolic BP Percentile      Boys Systolic BP Percentile      Boys Diastolic BP Percentile      Pulse Rate 82     Resp 17     Temp 98.7 F (37.1 C)     Temp Source Axillary     SpO2 96 %     Weight      Height      Head Circumference      Peak Flow      Pain Score 9     Pain Loc      Pain Education      Exclude from Growth Chart     Most recent vital signs: Vitals:   08/17/24 0500 08/17/24 0530  BP: 117/70 116/83  Pulse: 84 81  Resp:  18  Temp:    SpO2: 95% 95%    General: Awake, no distress.  CV:  Good peripheral perfusion.  Resp:  Normal effort.  Abd:  No distention.  Other:  Benign abdominal exam nonperitoneal nonfocal.  No significant CVA tenderness.  Vital signs reviewed and within normal limits, afebrile.   ED Results / Procedures / Treatments   Labs (all labs ordered are listed, but only abnormal results are displayed) Labs Reviewed  CBC WITH DIFFERENTIAL/PLATELET - Abnormal; Notable for the  following components:      Result Value   WBC 12.5 (*)    Neutro Abs 10.3 (*)    All other components within normal limits  BASIC METABOLIC PANEL WITH GFR - Abnormal; Notable for the following components:   Sodium 134 (*)    Chloride 97 (*)    Glucose, Bld 369 (*)    All other components within normal limits  URINALYSIS, ROUTINE W REFLEX MICROSCOPIC - Abnormal; Notable for the following components:   Color, Urine YELLOW (*)    APPearance CLEAR (*)    Glucose, UA >=500 (*)    Hgb urine dipstick SMALL (*)    Ketones, ur 5 (*)    Leukocytes,Ua TRACE (*)    Bacteria, UA RARE (*)    All other components within normal limits  URINE CULTURE     I ordered and reviewed the above labs they are notable for white blood cell count 12.5.  Rare bacteria and trace leukocytes on urinalysis.   RADIOLOGY I independently reviewed and interpreted CT scan of the abdomen and see inflammatory changes surrounding  the left kidney as well as a hyperdensity in the distal ureters I also reviewed radiologist's formal read.   PROCEDURES:  Critical Care performed: No  Procedures   MEDICATIONS ORDERED IN ED: Medications  morphine  (PF) 4 MG/ML injection 4 mg (has no administration in time range)  sodium chloride  0.9 % bolus 500 mL (0 mLs Intravenous Stopped 08/17/24 0529)  ondansetron  (ZOFRAN ) injection 4 mg (4 mg Intravenous Given 08/17/24 0438)  ketorolac  (TORADOL ) 15 MG/ML injection 15 mg (15 mg Intravenous Given 08/17/24 0440)  cefTRIAXone  (ROCEPHIN ) 1 g in sodium chloride  0.9 % 100 mL IVPB (0 g Intravenous Stopped 08/17/24 0507)    External physician / consultants:  I spoke with Dr. Twylla of urology regarding care plan for this patient.   IMPRESSION / MDM / ASSESSMENT AND PLAN / ED COURSE  I reviewed the triage vital signs and the nursing notes.                                Patient's presentation is most consistent with acute presentation with potential threat to life or bodily  function.  Differential diagnosis includes, but is not limited to, renal colic, obstructive uropathy, pyelonephritis, intra-abdominal infection   MDM:    Patient with recurrent kidney stones here with left-sided lower abdominal pain with evidence of a large 7 x 11 mm obstructive ureterolithiasis with inflammatory changes around the kidney suspicious for either infection or forniceal rupture.  He also has some rare bacteria and leukocytes in the urine suspicious for urinary tract infection but certainly does not appear septic and pain is pretty well-controlled with initial pain medications.  Given the size and upstream effects on CT imaging, plan will be for admission and urology has been consulted and aware.  Given pain medications.  Started on Rocephin .  Urine culture sent.  Admit.       FINAL CLINICAL IMPRESSION(S) / ED DIAGNOSES   Final diagnoses:  Obstructive uropathy  Ureterolithiasis  Pyelonephritis     Rx / DC Orders   ED Discharge Orders     None        Note:  This document was prepared using Dragon voice recognition software and may include unintentional dictation errors.    Cyrena Mylar, MD 08/17/24 [phone]  "

## 2024-08-17 NOTE — ED Triage Notes (Signed)
 Pt reports left flank pain that began earlier tonight, pt reports hx kidney stones. Pain radiates into groin. Pt denies dysuria or difficulty urinating.

## 2024-08-17 NOTE — Plan of Care (Signed)
" °  Problem: Education: Goal: Knowledge of General Education information will improve Description: Including pain rating scale, medication(s)/side effects and non-pharmacologic comfort measures Outcome: Progressing   Problem: Clinical Measurements: Goal: Respiratory complications will improve Outcome: Progressing   Problem: Activity: Goal: Risk for activity intolerance will decrease Outcome: Progressing   Problem: Elimination: Goal: Will not experience complications related to bowel motility Outcome: Progressing   Problem: Elimination: Goal: Will not experience complications related to urinary retention Outcome: Progressing   Problem: Pain Managment: Goal: General experience of comfort will improve and/or be controlled Outcome: Progressing   "

## 2024-08-17 NOTE — H&P (Signed)
 " History and Physical    Seth Riddle FMW:[id number] DOB: 03/23/1961 DOA: 08/17/2024  PCP: Pcp, No (Confirm with patient/family/NH records and if not entered, this has to be entered at [facility] point of entry) Patient coming from: Home  I have personally briefly reviewed patient's old medical records in [facility]  Chief Complaint: Feeling better  HPI: Seth Riddle is a 63 y.o. male with medical history significant of CAD, ischemic cardiomyopathy, chronic HFrEF with LVEF 30%, AICD, IDDM, presented with sudden onset of left-sided flank pain.  Last night patient suddenly woke up with 10/10 sharp like left flank pain radiating to left groin area, he denies any dysuria, no fever or chills.  No diarrhea.  Denies chest pain shortness of breath.  He had similar pain before when he had kidney stone in 2018. ED Course: Afebrile, nontachycardic blood pressure 135/85 O2 saturation 95% on room air.  CT abdominal pelvis showed left obstructing ureteral stone with moderate hydronephrosis.  UA showed 2+ WBC and 3+ RBC, blood work showed WBC 12.5 hemoglobin 15.5 BUN 19 creatinine 1.2 glucose 369.  Review of Systems: As per HPI otherwise 14 point review of systems negative.   Past Medical History:  Diagnosis Date   Coronary artery disease    Diabetes mellitus without complication (HCC)    Dyslipidemia (high LDL; low HDL)    Family history of premature CAD    Headache    History of kidney stones    Low HDL (under 40)    MI (myocardial infarction) (HCC) 2016   PONV (postoperative nausea and vomiting)    as a child eye surgery    Past Surgical History:  Procedure Laterality Date   CORONARY ARTERY BYPASS GRAFT N/A 11/15/2014   Procedure: CORONARY ARTERY BYPASS GRAFTING (CABG), ON PUMP, TIMES THREE, USING LEFT INTERNAL MAMMARY ARTERY, RIGHT GREATER SAPHENOUS VEIN HARVESTED ENDOSCOPICALLY;  Surgeon: Elspeth JAYSON Millers, MD;  Location: [facility] OR;  Service: Open Heart Surgery;  Laterality: N/A;    CYSTOSCOPY W/ URETERAL STENT PLACEMENT Right 02/04/2017   Procedure: CYSTOSCOPY WITH STENT REPLACEMENT;  Surgeon: Penne Knee, MD;  Location: [facility] ORS;  Service: Urology;  Laterality: Right;   CYSTOSCOPY W/ URETERAL STENT REMOVAL Left 02/04/2017   Procedure: CYSTOSCOPY WITH STENT REMOVAL;  Surgeon: Penne Knee, MD;  Location: [facility] ORS;  Service: Urology;  Laterality: Left;   CYSTOSCOPY WITH STENT PLACEMENT Bilateral 01/17/2017   Procedure: CYSTOSCOPY WITH STENT PLACEMENT;  Surgeon: Penne Knee, MD;  Location: [facility] ORS;  Service: Urology;  Laterality: Bilateral;   EYE SURGERY     multiple eye surgeries as a child   ICD IMPLANT N/A 12/03/2022   Procedure: ICD IMPLANT;  Surgeon: Cindie Ole DASEN, MD;  Location[facility] INVAS[facility] CV LAB;  Service: Cardiovascular;  Laterality: N/A;   INTRA-AORTIC BALLOON PUMP INSERTION N/A 11/15/2014   Procedure: INTRA-AORTIC BALLOON PUMP INSERTION;  Surgeon: Debby DELENA Sor, MD;  Location[facility] CATH LAB;  Service: Cardiovascular;  Laterality: N/A;   LEFT HEART CATH AND CORONARY ANGIOGRAPHY N/A 07/10/2021   Procedure: LEFT HEART CATH AND CORONARY ANGIOGRAPHY;  Surgeon: Mady Bruckner, MD;  Location: [facility] INVASIVE CV LAB;  Service: Cardiovascular;  Laterality: N/A;   LEFT HEART CATHETERIZATION WITH CORONARY ANGIOGRAM N/A 11/14/2014   Procedure: LEFT HEART CATHETERIZATION WITH CORONARY ANGIOGRAM;  Surgeon: Debby DELENA Sor, MD;  Location: [facility] CATH LAB;  Service: Cardiovascular;  Laterality: N/A;   PERCUTANEOUS CORONARY STENT INTERVENTION (PCI-S) N/A 11/15/2014   Procedure: PERCUTANEOUS CORONARY STENT INTERVENTION (PCI-S);  Surgeon: Debby  DELENA Sor, MD;  Location: [facility] CATH LAB;  Service: Cardiovascular;  Laterality: N/A;   STOMACH SURGERY     at 42 weeks old   TEE WITHOUT CARDIOVERSION N/A 11/15/2014   Procedure: TRANSESOPHAGEAL ECHOCARDIOGRAM (TEE);  Surgeon: Elspeth JAYSON Millers, MD;  Location: [facility] OR;  Service: Open Heart Surgery;  Laterality: N/A;   URETEROSCOPY WITH  HOLMIUM LASER LITHOTRIPSY Bilateral 01/17/2017   Procedure: URETEROSCOPY WITH HOLMIUM LASER LITHOTRIPSY;  Surgeon: Penne Knee, MD;  Location: [facility] ORS;  Service: Urology;  Laterality: Bilateral;   URETEROSCOPY WITH HOLMIUM LASER LITHOTRIPSY Right 02/04/2017   Procedure: URETEROSCOPY WITH HOLMIUM LASER LITHOTRIPSY;  Surgeon: Penne Knee, MD;  Location: [facility] ORS;  Service: Urology;  Laterality: Right;     reports that he has never smoked. He has never used smokeless tobacco. He reports current alcohol use. He reports that he does not use drugs.  Allergies[1]  Family History  Problem Relation Age of Onset   Heart disease Mother    Stroke Mother    Nephrolithiasis Mother    Heart attack Father    Stroke Father    Kidney cancer Neg Hx    Bladder Cancer Neg Hx    Prostate cancer Neg Hx      Prior to Admission medications  Medication Sig Start Date End Date Taking? Authorizing Provider  aspirin  EC 81 MG tablet Take 1 tablet (81 mg total) by mouth daily. 07/11/21  Yes Tobie Yetta HERO, MD  ezetimibe  (ZETIA ) 10 MG tablet TAKE 1 TABLET BY MOUTH EVERY DAY 02/24/23  Yes Hammock, Sheri, NP  JARDIANCE 10 MG TABS tablet Take 10 mg by mouth daily.   Yes [provider]  metFORMIN  (GLUCOPHAGE -XR) 500 MG 24 hr tablet Take 1-2 tablets by mouth 2 (two) times daily. Take 2 tablet by mouth in the morning and 1 tablet by mouth in the evening   Yes [provider]  rosuvastatin  (CRESTOR ) 40 MG tablet TAKE 1 TABLET BY MOUTH EVERY DAY 11/21/22  Yes Hammock, Sheri, NP  sacubitril -valsartan  (ENTRESTO ) 24-26 MG Take 1 tablet by mouth 2 (two) times daily. 06/28/22  Yes Gerard Frederick, NP    Physical Exam: Vitals:   08/17/24 0432 08/17/24 0500 08/17/24 0530 08/17/24 0914  BP:  117/70 116/83 (!) 97/57  Pulse:  84 81 70  Resp:   18 18  Temp: 98.8 F (37.1 C)   98.2 F (36.8 C)  TempSrc: Oral     SpO2:  95% 95% 96%    Constitutional: NAD, calm, comfortable Vitals:   08/17/24 0432  08/17/24 0500 08/17/24 0530 08/17/24 0914  BP:  117/70 116/83 (!) 97/57  Pulse:  84 81 70  Resp:   18 18  Temp: 98.8 F (37.1 C)   98.2 F (36.8 C)  TempSrc: Oral     SpO2:  95% 95% 96%   Eyes: PERRL, lids and conjunctivae normal ENMT: Mucous membranes are moist. Posterior pharynx clear of any exudate or lesions.Normal dentition.  Neck: normal, supple, no masses, no thyromegaly Respiratory: clear to auscultation bilaterally, no wheezing, no crackles. Normal respiratory effort. No accessory muscle use.  Cardiovascular: Regular rate and rhythm, no murmurs / rubs / gallops. No extremity edema. 2+ pedal pulses. No carotid bruits.  Abdomen: Left flank tenderness, no masses palpated. No hepatosplenomegaly. Bowel sounds positive.  Musculoskeletal: no clubbing / cyanosis. No joint deformity upper and lower extremities. Good ROM, no contractures. Normal muscle tone.  Skin: no rashes, lesions, ulcers. No induration Neurologic: CN 2-12 grossly intact. Sensation  intact, DTR normal. Strength 5/5 in all 4.  Psychiatric: Normal judgment and insight. Alert and oriented x 3. Normal mood.     Labs on Admission: I have personally reviewed following labs and imaging studies  CBC: Recent Labs  Lab 08/17/24 0231  WBC 12.5*  NEUTROABS 10.3*  HGB 15.5  HCT 43.7  MCV 86.0  PLT 160   Basic Metabolic Panel: Recent Labs  Lab 08/17/24 0231  NA 134*  K 4.1  CL 97*  CO2 24  GLUCOSE 369*  BUN 19  CREATININE 1.22  CALCIUM  9.9   GFR: CrCl cannot be calculated (Unknown ideal weight.). Liver Function Tests: No results for input(s): AST, ALT, ALKPHOS, BILITOT, PROT, ALBUMIN  in the last 168 hours. No results for input(s): LIPASE, AMYLASE in the last 168 hours. No results for input(s): AMMONIA in the last 168 hours. Coagulation Profile: No results for input(s): INR, PROTIME in the last 168 hours. Cardiac Enzymes: No results for input(s): CKTOTAL, CKMB, CKMBINDEX,  TROPONINI in the last 168 hours. BNP (last 3 results) No results for input(s): PROBNP in the last 8760 hours. HbA1C: No results for input(s): HGBA1C in the last 72 hours. CBG: Recent Labs  Lab 08/17/24 0926  GLUCAP 292*   Lipid Profile: No results for input(s): CHOL, HDL, LDLCALC, TRIG, CHOLHDL, LDLDIRECT in the last 72 hours. Thyroid Function Tests: No results for input(s): TSH, T4TOTAL, FREET4, T3FREE, THYROIDAB in the last 72 hours. Anemia Panel: No results for input(s): VITAMINB12, FOLATE, FERRITIN, TIBC, IRON, RETICCTPCT in the last 72 hours. Urine analysis:    Component Value Date/Time   COLORURINE YELLOW (A) 08/17/2024 0231   APPEARANCEUR CLEAR (A) 08/17/2024 0231   APPEARANCEUR Cloudy (A) 02/21/2017 1342   LABSPEC 1.026 08/17/2024 0231   PHURINE 6.0 08/17/2024 0231   GLUCOSEU >=500 (A) 08/17/2024 0231   HGBUR SMALL (A) 08/17/2024 0231   BILIRUBINUR NEGATIVE 08/17/2024 0231   BILIRUBINUR Negative 02/21/2017 1342   KETONESUR 5 (A) 08/17/2024 0231   PROTEINUR NEGATIVE 08/17/2024 0231   UROBILINOGEN 1.0 11/15/2014 0612   NITRITE NEGATIVE 08/17/2024 0231   LEUKOCYTESUR TRACE (A) 08/17/2024 0231    Radiological Exams on Admission: DG Chest 1 View Result Date: 08/17/2024 EXAM: PA AND LATERAL (2) VIEW(S) XRAY OF THE CHEST 08/17/2024 08:00:00 AM COMPARISON: PA and lateral radiographs of the chest dated 12/03/2022. CLINICAL HISTORY: CHF (congestive heart failure) (HCC) FINDINGS: LINES, TUBES AND DEVICES: Left chest AICD in place. LUNGS AND PLEURA: No focal pulmonary opacity. No pleural effusion. No pneumothorax. HEART AND MEDIASTINUM: Mild cardiomegaly. CABG markers noted. Left chest AICD in place. BONES AND SOFT TISSUES: Nondisplaced median sternotomy wires. No acute osseous abnormality. IMPRESSION: 1. Mild cardiomegaly with left chest AICD in place, CABG markers noted, and nondisplaced median sternotomy wires. Electronically signed by:  Evalene Coho MD 08/17/2024 08:20 AM EST RP Workstation: [id number]   CT Renal Stone Study Result Date: 08/17/2024 EXAM: CT UROGRAM 08/17/2024 03:10:20 AM TECHNIQUE: CT of the abdomen and pelvis was performed without the administration of intravenous contrast as per CT urogram protocol. Multiplanar reformatted images as well as MIP urogram images are provided for review. Automated exposure control, iterative reconstruction, and/or weight based adjustment of the mA/kV was utilized to reduce the radiation dose to as low as reasonably achievable. COMPARISON: 01/15/2017 CLINICAL HISTORY: Left flank pain, nephrolithiasis. FINDINGS: LOWER CHEST: Cardiac pacer leads noted. LIVER: The liver is unremarkable. GALLBLADDER AND BILE DUCTS: Cholelithiasis without superimposed pericholecystic inflammatory change. No intra or extrahepatic biliary ductal dilation. SPLEEN: No acute abnormality.  PANCREAS: No acute abnormality. ADRENAL GLANDS: No acute abnormality. KIDNEYS, URETERS AND BLADDER: The kidneys are normal in size and position. Moderate left hydronephrosis and marked perinephric stranding secondary to an obstructing 7 x 11 mm calculus at the left ureterovesicular junction protruding into the lateral lumen. Extensive perinephric stranding may relate to pyelonephritis or rupture, superimposed infection could appear similarly and correlation with urinalysis and urine culture is recommended for further management. Superimposed 3 mm nonobstructing calculus within the lower pole of the left kidney. Conglomerate nonobstructing calculi within the lower pole of the right kidney measuring up to 14 mm. No ureteral calculi in the right. No hydronephrosis on the right. The bladder is otherwise unremarkable. GI AND BOWEL: Mild sigmoid diverticulosis without superimposed acute inflammatory change. The stomach, small bowel, and large bowel are otherwise unremarkable. Appendix normal. PERITONEUM AND RETROPERITONEUM: No ascites. No  free air. VASCULATURE: Mild aortoiliac atherosclerotic calcification. No aortic aneurysm. LYMPH NODES: No lymphadenopathy. REPRODUCTIVE ORGANS: No acute abnormality. BONES AND SOFT TISSUES: Osseous structures are age appropriate. No acute bone abnormality. No lytic or blastic bone lesion. No focal soft tissue abnormality. IMPRESSION: 1. Obstructing 7 x 11 mm calculus at the left ureterovesicular junction causing moderate left hydronephrosis and marked perinephric stranding, possibly related to forniceal rupture or superimposed infection; recommend urinalysis and urine culture for further management. 2. Nonobstructing 3 mm calculus within the lower pole of the left kidney. 3. Conglomerate nonobstructing calculi within the lower pole of the right kidney measuring up to 14 mm, without right hydronephrosis or right ureteral calculi. 4. Cholelithiasis. 5. Mild sigmoid diverticulosis. Electronically signed by: Dorethia Molt MD 08/17/2024 04:38 AM EST RP Workstation: [id number]    EKG: None  Assessment/Plan Principal Problem:   Acute unilateral obstructive uropathy Active Problems:   Obstructive uropathy  (please populate well all problems here in Problem List. (For example, if patient is on BP meds at home and you resume or decide to hold them, it is a problem that needs to be her. Same for CAD, COPD, HLD and so on)  Left sided obstructive uropathy Complicated UTI with hematuria - Urology Dr. Twylla is on consult - Continue symptomatic management as needed Zofran  and morphine  - Continue ceftriaxone , urine culture is pending. - Flomax   Chronic HFrEF - Euvolemic BP stable, - Avoid NSAIDs - Continue aspirin , Entresto  and statin  IIDM with hyperglycemia - Patient is n.p.o., hold off p.o. diabetic medications - Start SSI  DVT prophylaxis: Lovenox  Code Status: Full code Family Communication: None at bedside Disposition Plan: Patient septic with left-sided obstructive uropathy requiring emergency  urology procedure, complicated UTI requiring IV antibiotics, expect more than 2 midnight hospital stay. Consults called: Urology Admission status: MedSurg admission   Cort ONEIDA Mana MD [facility] Pager [phone]  08/17/2024, 11:35 AM       [1]  Allergies Allergen Reactions   Coconut (Cocos Nucifera) Hives   "

## 2024-08-17 NOTE — Plan of Care (Signed)

## 2024-08-18 ENCOUNTER — Other Ambulatory Visit: Payer: Self-pay | Admitting: Urology

## 2024-08-18 ENCOUNTER — Inpatient Hospital Stay: Admitting: Certified Registered Nurse Anesthetist

## 2024-08-18 ENCOUNTER — Encounter
Admission: EM | Disposition: A | Discharge status: Home / Self Care | Payer: Self-pay | Source: Home / Self Care | Attending: Internal Medicine

## 2024-08-18 ENCOUNTER — Inpatient Hospital Stay

## 2024-08-18 DIAGNOSIS — N179 Acute kidney failure, unspecified: Secondary | ICD-10-CM

## 2024-08-18 DIAGNOSIS — I5022 Chronic systolic (congestive) heart failure: Secondary | ICD-10-CM | POA: Diagnosis not present

## 2024-08-18 DIAGNOSIS — E1165 Type 2 diabetes mellitus with hyperglycemia: Secondary | ICD-10-CM

## 2024-08-18 DIAGNOSIS — E785 Hyperlipidemia, unspecified: Secondary | ICD-10-CM

## 2024-08-18 DIAGNOSIS — E669 Obesity, unspecified: Secondary | ICD-10-CM

## 2024-08-18 DIAGNOSIS — N201 Calculus of ureter: Secondary | ICD-10-CM | POA: Diagnosis not present

## 2024-08-18 DIAGNOSIS — N139 Obstructive and reflux uropathy, unspecified: Secondary | ICD-10-CM | POA: Diagnosis not present

## 2024-08-18 HISTORY — PX: CYSTOSCOPY/URETEROSCOPY/HOLMIUM LASER/STENT PLACEMENT: SHX6546

## 2024-08-18 LAB — CBC
HCT: 40.6 % (ref 39.0–52.0)
Hemoglobin: 13.8 g/dL (ref 13.0–17.0)
MCH: 30.3 pg (ref 26.0–34.0)
MCHC: 34 g/dL (ref 30.0–36.0)
MCV: 89 fL (ref 80.0–100.0)
Platelets: 147 K/uL — ABNORMAL LOW (ref 150–400)
RBC: 4.56 MIL/uL (ref 4.22–5.81)
RDW: 11.8 % (ref 11.5–15.5)
WBC: 8.9 K/uL (ref 4.0–10.5)
nRBC: 0 % (ref 0.0–0.2)

## 2024-08-18 LAB — BASIC METABOLIC PANEL WITH GFR
Anion gap: 8 (ref 5–15)
BUN: 23 mg/dL (ref 8–23)
CO2: 26 mmol/L (ref 22–32)
Calcium: 9.2 mg/dL (ref 8.9–10.3)
Chloride: 100 mmol/L (ref 98–111)
Creatinine, Ser: 1.56 mg/dL — ABNORMAL HIGH (ref 0.61–1.24)
GFR, Estimated: 50 mL/min — ABNORMAL LOW
Glucose, Bld: 279 mg/dL — ABNORMAL HIGH (ref 70–99)
Potassium: 4.1 mmol/L (ref 3.5–5.1)
Sodium: 135 mmol/L (ref 135–145)

## 2024-08-18 LAB — GLUCOSE, CAPILLARY
Glucose-Capillary: 257 mg/dL — ABNORMAL HIGH (ref 70–99)
Glucose-Capillary: 201 mg/dL — ABNORMAL HIGH (ref 70–99)
Glucose-Capillary: 173 mg/dL — ABNORMAL HIGH (ref 70–99)
Glucose-Capillary: 166 mg/dL — ABNORMAL HIGH (ref 70–99)
Glucose-Capillary: 250 mg/dL — ABNORMAL HIGH (ref 70–99)

## 2024-08-18 LAB — URINE CULTURE: Culture: NO GROWTH

## 2024-08-18 SURGERY — CYSTOSCOPY/URETEROSCOPY/HOLMIUM LASER/STENT PLACEMENT
Anesthesia: General | Site: Ureter | Laterality: Left

## 2024-08-18 MED ORDER — ROCURONIUM BROMIDE 10 MG/ML (PF) SYRINGE
PREFILLED_SYRINGE | INTRAVENOUS | Status: AC
  Filled 2024-08-18: qty 10

## 2024-08-18 MED ORDER — FENTANYL CITRATE (PF) 100 MCG/2ML IJ SOLN: 25.0000 ug | INTRAMUSCULAR | Status: DC | PRN

## 2024-08-18 MED ORDER — LIDOCAINE HCL (PF) 2 % IJ SOLN
INTRAMUSCULAR | Status: AC
  Filled 2024-08-18: qty 5

## 2024-08-18 MED ORDER — OXYBUTYNIN CHLORIDE 5 MG PO TABS: ORAL_TABLET | ORAL | 0 refills | Status: DC

## 2024-08-18 MED ORDER — ONDANSETRON HCL 4 MG/2ML IJ SOLN
INTRAMUSCULAR | Status: AC
  Filled 2024-08-18: qty 2

## 2024-08-18 MED ORDER — TAMSULOSIN HCL 0.4 MG PO CAPS: 0.4000 mg | ORAL_CAPSULE | Freq: Every day | ORAL | 0 refills | Status: DC

## 2024-08-18 MED ORDER — SUGAMMADEX SODIUM 200 MG/2ML IV SOLN
INTRAVENOUS | Status: DC | PRN
  Administered 2024-08-18: 100 mg via INTRAVENOUS
  Administered 2024-08-18: 200 mg via INTRAVENOUS

## 2024-08-18 MED ORDER — TAMSULOSIN HCL 0.4 MG PO CAPS: 0.4000 mg | ORAL_CAPSULE | Freq: Every day | ORAL | 0 refills | Status: AC

## 2024-08-18 MED ORDER — 0.9 % SODIUM CHLORIDE (POUR BTL) OPTIME
TOPICAL | Status: DC | PRN
  Administered 2024-08-18: 500 mL

## 2024-08-18 MED ORDER — NOREPINEPHRINE 4 MG/250ML-% IV SOLN
INTRAVENOUS | Status: DC | PRN
  Administered 2024-08-18: 2 ug/min via INTRAVENOUS

## 2024-08-18 MED ORDER — ACETAMINOPHEN 10 MG/ML IV SOLN
INTRAVENOUS | Status: AC
  Filled 2024-08-18: qty 100

## 2024-08-18 MED ORDER — OXYCODONE HCL 5 MG PO TABS
ORAL_TABLET | ORAL | Status: AC
  Filled 2024-08-18: qty 1

## 2024-08-18 MED ORDER — INSULIN GLARGINE 100 UNIT/ML SOLOSTAR PEN: 10.0000 [IU] | PEN_INJECTOR | Freq: Every day | SUBCUTANEOUS | 0 refills | Status: AC

## 2024-08-18 MED ORDER — ACETAMINOPHEN 10 MG/ML IV SOLN
INTRAVENOUS | Status: DC | PRN
  Administered 2024-08-18: 1000 mg via INTRAVENOUS

## 2024-08-18 MED ORDER — FENTANYL CITRATE (PF) 100 MCG/2ML IJ SOLN
INTRAMUSCULAR | Status: DC | PRN
  Administered 2024-08-18: 100 ug via INTRAVENOUS

## 2024-08-18 MED ORDER — CEPHALEXIN 500 MG PO CAPS: 500.0000 mg | ORAL_CAPSULE | Freq: Three times a day (TID) | ORAL | 0 refills | Status: AC

## 2024-08-18 MED ORDER — OXYCODONE HCL 5 MG PO TABS
5.0000 mg | ORAL_TABLET | Freq: Once | ORAL | Status: AC | PRN
  Administered 2024-08-18: 5 mg via ORAL

## 2024-08-18 MED ORDER — INSULIN PEN NEEDLE 32G X 4 MM MISC: 1.0000 | Freq: Every day | 0 refills | Status: AC

## 2024-08-18 MED ORDER — MIDAZOLAM HCL 2 MG/2ML IJ SOLN
INTRAMUSCULAR | Status: AC
  Filled 2024-08-18: qty 2

## 2024-08-18 MED ORDER — MELATONIN 5 MG PO TABS
5.0000 mg | ORAL_TABLET | Freq: Every day | ORAL | Status: DC
  Administered 2024-08-18: 5 mg via ORAL
  Filled 2024-08-18: qty 1

## 2024-08-18 MED ORDER — IOHEXOL 180 MG/ML  SOLN
INTRAMUSCULAR | Status: DC | PRN
  Administered 2024-08-18: 20 mL

## 2024-08-18 MED ORDER — ASPIRIN 81 MG PO TBEC
81.0000 mg | DELAYED_RELEASE_TABLET | Freq: Every day | ORAL | Status: DC
  Administered 2024-08-19: 81 mg via ORAL
  Filled 2024-08-18: qty 1

## 2024-08-18 MED ORDER — PROPOFOL 10 MG/ML IV BOLUS
INTRAVENOUS | Status: AC
  Filled 2024-08-18: qty 20

## 2024-08-18 MED ORDER — PHENYLEPHRINE 80 MCG/ML (10ML) SYRINGE FOR IV PUSH (FOR BLOOD PRESSURE SUPPORT)
PREFILLED_SYRINGE | INTRAVENOUS | Status: DC | PRN
  Administered 2024-08-18 (×2): 80 ug via INTRAVENOUS

## 2024-08-18 MED ORDER — LIDOCAINE HCL (CARDIAC) PF 100 MG/5ML IV SOSY
PREFILLED_SYRINGE | INTRAVENOUS | Status: DC | PRN
  Administered 2024-08-18: 100 mg via INTRAVENOUS

## 2024-08-18 MED ORDER — MIDAZOLAM HCL (PF) 2 MG/2ML IJ SOLN
INTRAMUSCULAR | Status: DC | PRN
  Administered 2024-08-18: 2 mg via INTRAVENOUS

## 2024-08-18 MED ORDER — PROPOFOL 10 MG/ML IV BOLUS
INTRAVENOUS | Status: DC | PRN
  Administered 2024-08-18: 50 mg via INTRAVENOUS
  Administered 2024-08-18: 125 ug/kg/min via INTRAVENOUS

## 2024-08-18 MED ORDER — INSULIN GLARGINE 100 UNIT/ML ~~LOC~~ SOLN
10.0000 [IU] | Freq: Every day | SUBCUTANEOUS | Status: DC
  Administered 2024-08-18: 10 [IU] via SUBCUTANEOUS
  Filled 2024-08-18: qty 0.1

## 2024-08-18 MED ORDER — PROPOFOL 1000 MG/100ML IV EMUL
INTRAVENOUS | Status: AC
  Filled 2024-08-18: qty 100

## 2024-08-18 MED ORDER — FENTANYL CITRATE (PF) 100 MCG/2ML IJ SOLN
INTRAMUSCULAR | Status: AC
  Filled 2024-08-18: qty 2

## 2024-08-18 MED ORDER — SODIUM CHLORIDE 0.9 % IR SOLN
Status: DC | PRN
  Administered 2024-08-18: 3000 mL

## 2024-08-18 MED ORDER — OXYBUTYNIN CHLORIDE 5 MG PO TABS: ORAL_TABLET | ORAL | 0 refills | Status: AC

## 2024-08-18 MED ORDER — OXYCODONE HCL 5 MG/5ML PO SOLN: 5.0000 mg | Freq: Once | ORAL | Status: AC | PRN

## 2024-08-18 MED ORDER — NOREPINEPHRINE 4 MG/250ML-% IV SOLN
INTRAVENOUS | Status: AC
  Filled 2024-08-18: qty 250

## 2024-08-18 MED ORDER — ROCURONIUM BROMIDE 100 MG/10ML IV SOLN
INTRAVENOUS | Status: DC | PRN
  Administered 2024-08-18: 60 mg via INTRAVENOUS

## 2024-08-18 MED ORDER — SODIUM CHLORIDE 0.9 % IV SOLN: INTRAVENOUS | Status: DC | PRN

## 2024-08-18 SURGICAL SUPPLY — 21 items
BAG DRAIN SIEMENS DORNER NS (MISCELLANEOUS) ×1 IMPLANT
BASKET ZERO TIP 1.9FR (BASKET) IMPLANT
BRUSH SCRUB EZ 4% CHG (MISCELLANEOUS) ×1 IMPLANT
CATH URET FLEX-TIP 2 LUMEN 10F (CATHETERS) IMPLANT
CATH URETL OPEN END 6X70 (CATHETERS) IMPLANT
DRAPE UTILITY 15X26 TOWEL STRL (DRAPES) ×1 IMPLANT
FIBER LASER MOSES 200 DFL (Laser) IMPLANT
GLOVE BIOGEL PI IND STRL 7.5 (GLOVE) ×1 IMPLANT
GOWN STRL REUS W/ TWL LRG LVL3 (GOWN DISPOSABLE) ×1 IMPLANT
GOWN STRL REUS W/ TWL XL LVL3 (GOWN DISPOSABLE) ×1 IMPLANT
GUIDEWIRE STR DUAL SENSOR (WIRE) ×1 IMPLANT
KIT TURNOVER CYSTO (KITS) ×1 IMPLANT
PACK CYSTO AR (MISCELLANEOUS) ×1 IMPLANT
SHEATH NAVIGATOR HD 12/14X36 (SHEATH) IMPLANT
SOL .9 NS 3000ML IRR UROMATIC (IV SOLUTION) ×1 IMPLANT
SOLN STERILE WATER 500 ML (IV SOLUTION) ×1 IMPLANT
STENT URET 6FRX24 CONTOUR (STENTS) IMPLANT
STENT URET 6FRX26 CONTOUR (STENTS) IMPLANT
SURGILUBE 2OZ TUBE FLIPTOP (MISCELLANEOUS) ×1 IMPLANT
TUBING THERMACLEAR UROLOGY (TUBING) ×1 IMPLANT
VALVE UROSEAL ADJ ENDO (VALVE) IMPLANT

## 2024-08-18 NOTE — TOC CM/SW Note (Signed)
 Transition of Care [facility]) - Inpatient Brief Assessment   Patient Details  Name: Seth Riddle MRN: [id number] Date of Birth: 07/31/1961  [facility] of [facility]) CM/SW Contact:    Lauraine JAYSON Carpen, LCSW Phone Number: 08/18/2024, 1:25 PM   Clinical Narrative: CSW reviewed chart. No PCP. Added list to AVS. No other TOC needs identified so far. CSW will continue to follow progress. Please place [facility] consult if any needs arise.  Transition of Care Asessment: Insurance and Status: Insurance coverage has been reviewed Patient has primary care physician: No Home environment has been reviewed: Single family home Prior level of function:: Not documented Prior/Current Home Services: No current home services Social Drivers of Health Review: SDOH reviewed no interventions necessary Readmission risk has been reviewed: Yes Transition of care needs: no transition of care needs at this time

## 2024-08-18 NOTE — Assessment & Plan Note (Addendum)
 Patient seen before going to the operating room today.  Urology to do procedure today.  Empiric Rocephin .

## 2024-08-18 NOTE — Assessment & Plan Note (Signed)
 Class I obesity with a BMI of 34.17

## 2024-08-18 NOTE — Assessment & Plan Note (Signed)
 Hemoglobin A1c elevated at 11.3.  Will start Lantus this evening.  On Jardiance and metformin  at home.

## 2024-08-18 NOTE — Anesthesia Postprocedure Evaluation (Signed)
"   Anesthesia Post Note  Patient: Seth Riddle  Procedure(s) Performed: CYSTOSCOPY/URETEROSCOPY/HOLMIUM LASER/STENT PLACEMENT (Left: Ureter)  Patient location during evaluation: PACU Anesthesia Type: General Level of consciousness: awake and alert Pain management: pain level controlled Vital Signs Assessment: post-procedure vital signs reviewed and stable Respiratory status: spontaneous breathing, nonlabored ventilation, respiratory function stable and patient connected to nasal cannula oxygen Cardiovascular status: blood pressure returned to baseline and stable Postop Assessment: no apparent nausea or vomiting Anesthetic complications: no   No notable events documented.   Last Vitals:  Vitals:   08/18/24 1432 08/18/24 1436  BP: (!) 89/62 (!) 82/62  Pulse: 73 68  Resp: 13 20  Temp:    SpO2: 97% 92%    Last Pain:  Vitals:   08/18/24 1417  TempSrc:   PainSc: 5                  Seth Riddle      "

## 2024-08-18 NOTE — Discharge Instructions (Addendum)
 Some PCP options in [place] area- not a comprehensive list  [facility]- [phone] [facility]- [phone] [facility]- [phone] [facility]- [phone] [facility]- [phone] Nichole Molly- [phone]  or [facility] Physician Referral Line [phone]    DISCHARGE INSTRUCTIONS FOR KIDNEY STONE/URETERAL STENT   MEDICATIONS:  1. Resume all your other meds from home.  2.  AZO (over-the-counter) can help with the burning/stinging when you urinate. 3.  Tamsulosin  and oxybutynin  are for/bladder irritation, Rxs were sent to your pharmacy if needed.  ACTIVITY:  1. May resume regular activities in 48 hours. 2. Drink plenty of water  3. Continue to walk at home - you can still get blood clots when you are at home, so keep active, but don't over do it.  4. May return to work/school tomorrow or when you feel ready    SIGNS/SYMPTOMS TO CALL:  Common postoperative symptoms include urinary frequency, urgency, bladder spasm and blood in the urine  Please call us  if you have a fever greater than 101.5, uncontrolled nausea/vomiting, uncontrolled pain, dizziness, unable to urinate, excessively bloody urine, chest pain, shortness of breath, leg swelling, leg pain, or any other concerns or questions.   You can reach us  at [phone].   FOLLOW-UP:  1. You will be contacted for an appointment for stent removal in 7-10 days

## 2024-08-18 NOTE — Anesthesia Procedure Notes (Addendum)
 Procedure Name: Intubation Date/Time: 08/18/2024 12:54 PM  Performed by: Lorrene Camelia LABOR, CRNAPre-anesthesia Checklist: Patient identified, Patient being monitored, Emergency Drugs available and Suction available Patient Re-evaluated:Patient Re-evaluated prior to induction Oxygen Delivery Method: Circle system utilized Preoxygenation: Pre-oxygenation with 100% oxygen Induction Type: IV induction Ventilation: Mask ventilation without difficulty and Oral airway inserted - appropriate to patient size Laryngoscope Size: McGrath and 4 Grade View: Grade II Tube type: Oral Tube size: 7.5 mm Number of attempts: 1 Airway Equipment and Method: Stylet Placement Confirmation: ETT inserted through vocal cords under direct vision, positive ETCO2 and breath sounds checked- equal and bilateral Secured at: 21 cm Tube secured with: Tape Dental Injury: Teeth and Oropharynx as per pre-operative assessment

## 2024-08-18 NOTE — Assessment & Plan Note (Signed)
On Crestor and Zetia

## 2024-08-18 NOTE — Interval H&P Note (Signed)
 History and Physical Interval Note:  08/18/2024 12:23 PM  Seth Riddle  has presented today for surgery, with the diagnosis of Acute unilateral obstructive uropathy.  The various methods of treatment have been discussed with the patient and family. After consideration of risks, benefits and other options for treatment, the patient has consented to  Procedures: CYSTOSCOPY/URETEROSCOPY/HOLMIUM LASER/STENT PLACEMENT (Left) as a surgical intervention.  The patient's history has been reviewed, patient examined, no change in status, stable for surgery.  I have reviewed the patient's chart and labs.  Questions were answered to the patient's satisfaction.    CV:RRR Lungs:clear  Seth Riddle

## 2024-08-18 NOTE — Anesthesia Preprocedure Evaluation (Signed)
 "                                  Anesthesia Evaluation  Patient identified by MRN, date of birth, ID band Patient awake    Reviewed: Allergy & Precautions, NPO status , Patient's Chart, lab work & pertinent test results  History of Anesthesia Complications (+) PONV and history of anesthetic complications  Airway Mallampati: III  TM Distance: >3 FB Neck ROM: full    Dental   Pulmonary    Pulmonary exam normal        Cardiovascular hypertension, + CAD, + Past MI and +CHF  Normal cardiovascular exam+ Cardiac Defibrillator   IMPRESSIONS     1. Left ventricular ejection fraction, by estimation, is 30 to 35%. The  left ventricle has moderately decreased function. The left ventricle  demonstrates mild global hypokinesis, with severe hypokinesis of the mid  to distal walls including  anterior/anteroseptal/lateral and apical regions. The left ventricular  internal cavity size was mildly dilated. Left ventricular diastolic  parameters are consistent with Grade I diastolic dysfunction (impaired  relaxation).   2. Right ventricular systolic function is normal. The right ventricular  size is normal. Tricuspid regurgitation signal is inadequate for assessing  PA pressure.   3. The mitral valve is normal in structure. No evidence of mitral valve  regurgitation. No evidence of mitral stenosis.   4. The aortic valve is tricuspid. Aortic valve regurgitation is not  visualized. No aortic stenosis is present.   5. The inferior vena cava is normal in size with greater than 50%  respiratory variability, suggesting right atrial pressure of 3 mmHg.      Neuro/Psych  Headaches  negative psych ROS   GI/Hepatic negative GI ROS, Neg liver ROS,,,  Endo/Other  negative endocrine ROSdiabetes, Type 2    Renal/GU ARFRenal disease     Musculoskeletal   Abdominal   Peds  Hematology negative hematology ROS (+)   Anesthesia Other Findings Past Medical History: No date:  Coronary artery disease No date: Diabetes mellitus without complication (HCC) No date: Dyslipidemia (high LDL; low HDL) No date: Family history of premature CAD No date: Headache No date: History of kidney stones No date: Low HDL (under 40) 2016: MI (myocardial infarction) (HCC) No date: PONV (postoperative nausea and vomiting)     Comment:  as a child eye surgery  Past Surgical History: 11/15/2014: CORONARY ARTERY BYPASS GRAFT; N/A     Comment:  Procedure: CORONARY ARTERY BYPASS GRAFTING (CABG), ON               PUMP, TIMES THREE, USING LEFT INTERNAL MAMMARY ARTERY,               RIGHT GREATER SAPHENOUS VEIN HARVESTED ENDOSCOPICALLY;                Surgeon: Elspeth JAYSON Millers, MD;  Location: [facility] OR;                Service: Open Heart Surgery;  Laterality: N/A; 02/04/2017: CYSTOSCOPY W/ URETERAL STENT PLACEMENT; Right     Comment:  Procedure: CYSTOSCOPY WITH STENT REPLACEMENT;  Surgeon:               Penne Knee, MD;  Location: [facility] ORS;  Service:               Urology;  Laterality: Right; 02/04/2017: CYSTOSCOPY W/ URETERAL STENT REMOVAL; Left  Comment:  Procedure: CYSTOSCOPY WITH STENT REMOVAL;  Surgeon:               Penne Knee, MD;  Location: [facility] ORS;  Service:               Urology;  Laterality: Left; 01/17/2017: CYSTOSCOPY WITH STENT PLACEMENT; Bilateral     Comment:  Procedure: CYSTOSCOPY WITH STENT PLACEMENT;  Surgeon:               Penne Knee, MD;  Location: [facility] ORS;  Service:               Urology;  Laterality: Bilateral; No date: EYE SURGERY     Comment:  multiple eye surgeries as a child 12/03/2022: ICD IMPLANT; N/A     Comment:  Procedure: ICD IMPLANT;  Surgeon: Cindie Ole DASEN,               MD;  Location: [facility] INVASIVE CV LAB;  Service:               Cardiovascular;  Laterality: N/A; 11/15/2014: INTRA-AORTIC BALLOON PUMP INSERTION; N/A     Comment:  Procedure: INTRA-AORTIC BALLOON PUMP INSERTION;                Surgeon: Debby DELENA Sor, MD;  Location:  [facility] CATH LAB;                Service: Cardiovascular;  Laterality: N/A; 07/10/2021: LEFT HEART CATH AND CORONARY ANGIOGRAPHY; N/A     Comment:  Procedure: LEFT HEART CATH AND CORONARY ANGIOGRAPHY;                Surgeon: Mady Bruckner, MD;  Location: [facility] INVASIVE               CV LAB;  Service: Cardiovascular;  Laterality: N/A; 11/14/2014: LEFT HEART CATHETERIZATION WITH CORONARY ANGIOGRAM; N/A     Comment:  Procedure: LEFT HEART CATHETERIZATION WITH CORONARY               ANGIOGRAM;  Surgeon: Debby DELENA Sor, MD;  Location: [facility]               CATH LAB;  Service: Cardiovascular;  Laterality: N/A; 11/15/2014: PERCUTANEOUS CORONARY STENT INTERVENTION (PCI-S); N/A     Comment:  Procedure: PERCUTANEOUS CORONARY STENT INTERVENTION               (PCI-S);  Surgeon: Debby DELENA Sor, MD;  Location: [facility] CATH              LAB;  Service: Cardiovascular;  Laterality: N/A; No date: STOMACH SURGERY     Comment:  at 26 weeks old 11/15/2014: TEE WITHOUT CARDIOVERSION; N/A     Comment:  Procedure: TRANSESOPHAGEAL ECHOCARDIOGRAM (TEE);                Surgeon: Elspeth JAYSON Millers, MD;  Location: [facility] OR;                Service: Open Heart Surgery;  Laterality: N/A; 01/17/2017: URETEROSCOPY WITH HOLMIUM LASER LITHOTRIPSY; Bilateral     Comment:  Procedure: URETEROSCOPY WITH HOLMIUM LASER LITHOTRIPSY;               Surgeon: Penne Knee, MD;  Location: [facility] ORS;                Service: Urology;  Laterality: Bilateral; 02/04/2017: URETEROSCOPY WITH HOLMIUM LASER LITHOTRIPSY; Right     Comment:  Procedure: URETEROSCOPY WITH HOLMIUM LASER LITHOTRIPSY;  Surgeon: Penne Knee, MD;  Location: [facility] ORS;                Service: Urology;  Laterality: Right;  BMI    Body Mass Index: 34.17 kg/m      Reproductive/Obstetrics negative OB ROS                              Anesthesia Physical Anesthesia Plan  ASA: 4  Anesthesia Plan: General LMA   Post-op Pain Management:  Minimal or no pain anticipated   Induction: Intravenous  PONV Risk Score and Plan: 3 and Dexamethasone , Ondansetron  and Treatment may vary due to age or medical condition  Airway Management Planned: LMA  Additional Equipment:   Intra-op Plan:   Post-operative Plan: Extubation in OR  Informed Consent: I have reviewed the patients History and Physical, chart, labs and discussed the procedure including the risks, benefits and alternatives for the proposed anesthesia with the patient or authorized representative who has indicated his/her understanding and acceptance.     Dental Advisory Given  Plan Discussed with: Anesthesiologist, CRNA and Surgeon  Anesthesia Plan Comments: (Patient consented for risks of anesthesia including but not limited to:  - adverse reactions to medications - damage to eyes, teeth, lips or other oral mucosa - nerve damage due to positioning  - sore throat or hoarseness - Damage to heart, brain, nerves, lungs, other parts of body or loss of life  Patient voiced understanding and assent.)        Anesthesia Quick Evaluation  "

## 2024-08-18 NOTE — Assessment & Plan Note (Signed)
 Currently euvolemic.  Patient on Entresto  and Jardiance.

## 2024-08-18 NOTE — Transfer of Care (Signed)
 Immediate Anesthesia Transfer of Care Note  Patient: Seth Riddle  Procedure(s) Performed: CYSTOSCOPY/URETEROSCOPY/HOLMIUM LASER/STENT PLACEMENT (Left: Ureter)  Patient Location: PACU  Anesthesia Type:General  Level of Consciousness: drowsy and patient cooperative  Airway & Oxygen Therapy: Patient Spontanous Breathing and Patient connected to face mask oxygen  Post-op Assessment: Report given to RN and Post -op Vital signs reviewed and stable  Post vital signs: Reviewed and stable  Last Vitals:  Vitals Value Taken Time  BP 112/68 08/18/24 13:58  Temp 36.1 C 08/18/24 13:58  Pulse 84 08/18/24 13:58  Resp 19 08/18/24 13:58  SpO2 96 % 08/18/24 13:58    Last Pain:  Vitals:   08/18/24 1358  TempSrc: Tympanic  PainSc:          Complications: No notable events documented.

## 2024-08-18 NOTE — Assessment & Plan Note (Signed)
 Likely secondary to obstructing stone.  Follow-up as outpatient.

## 2024-08-18 NOTE — Op Note (Signed)
" ° °  Preoperative diagnosis:  Left ureteral calculus  Postoperative diagnosis:  Left ureteral calculus  Procedure:  Cystoscopy Left ureteroscopy and stone removal Ureteroscopic laser lithotripsy Left ureteral stent placement (81F/26 cm) Left retrograde pyelography with interpretation  Surgeon: Glendia C. Sharvil Hoey, M.D.  Anesthesia: General  Complications: None  Intraoperative findings:  Cystoscopy: Urethra normal in caliber without stricture; prostate with mild-moderate lateral lobe enlargement.  Bladder mucosa without solid or papillary lesions; left UO edematous with crowning stone Ureteroscopy: Crowning stone as above Left retrograde pyelography post procedure showed no filling defects, stone fragments or contrast extravasation.  Moderate left hydronephrosis/hydroureter  EBL: Minimal  Specimens: Calculus fragments for analysis   Indication: Seth Riddle is a 63 y.o. male with an 11 mm left UVJ calculus with renal colic admitted for pain control and possible infection.  Urine culture subsequently negative after reviewing the management options for treatment, the patient elected to proceed with the above surgical procedure(s). We have discussed the potential benefits and risks of the procedure, side effects of the proposed treatment, the likelihood of the patient achieving the goals of the procedure, and any potential problems that might occur during the procedure or recuperation. Informed consent has been obtained.  Description of procedure:  The patient was taken to the operating room and general anesthesia was induced.  The patient was placed in the dorsal lithotomy position, prepped and draped in the usual sterile fashion, and preoperative antibiotics were administered. A preoperative time-out was performed.   A 21 French cystoscope was lubricated, placed per urethra and advanced into the bladder under direct vision with findings as described above.    Attention was  directed to the left ureteral orifice and the calculus was pushed back into the ureter with the tip of the ureteroscope.  A 0.038 Sensor wire was then advanced up the ureter into the renal pelvis under fluoroscopic guidance.  A 4.5 Fr semirigid ureteroscope was then advanced into the ureter next to the guidewire and the calculus was identified.  The stone was then fragmented with a 200 m Moses holmium laser fiber at a setting of 0.3 J/80 hz.   All fragments were then removed from the ureter with a zero tip nitinol basket.  Reinspection of the ureter to the UPJ revealed no remaining visible stones or fragments.   Retrograde pyelogram was performed with findings as described above.  A 6 F/26 cm Contour ureteral stent without tether was placed under fluoroscopic guidance.  The wire was then removed with an adequate stent curl noted in an upper pole calyx as well as in the bladder.  The bladder was then emptied and the procedure ended.  The patient appeared to tolerate the procedure well and without complications.  After anesthetic reversal the patient was transported to the PACU in stable condition.   Plan: Okay for discharge from urologic standpoint today if comfortable Will schedule cystoscopy with stent removal in office 7-10 days   Glendia Barba, MD  "

## 2024-08-18 NOTE — Plan of Care (Signed)

## 2024-08-18 NOTE — Progress Notes (Signed)
 " Progress Note   Patient: Seth Riddle FMW:[id number] DOB: 19-Sep-1960 DOA: 08/17/2024     1 DOS: the patient was seen and examined on 08/18/2024   Brief hospital course: 63 y.o. male with medical history significant of CAD, ischemic cardiomyopathy, chronic HFrEF with LVEF 30%, AICD, IDDM, presented with sudden onset of left-sided flank pain.   Last night patient suddenly woke up with 10/10 sharp like left flank pain radiating to left groin area, he denies any dysuria, no fever or chills.  No diarrhea.  Denies chest pain shortness of breath.  He had similar pain before when he had kidney stone in 2018. ED Course: Afebrile, nontachycardic blood pressure 135/85 O2 saturation 95% on room air.  CT abdominal pelvis showed left obstructing ureteral stone with moderate hydronephrosis.  UA showed 2+ WBC and 3+ RBC, blood work showed WBC 12.5 hemoglobin 15.5 BUN 19 creatinine 1.2 glucose 369.  12/31.  Patient felt better prior to procedure this morning.  Diagnosed with obstructing kidney stone.  Assessment and Plan: * Acute unilateral obstructive uropathy Patient seen before going to the operating room today.  Urology to do procedure today.  Empiric Rocephin .  AKI (acute kidney injury) Likely secondary to obstructing stone  Uncontrolled type 2 diabetes mellitus with hyperglycemia, without long-term current use of insulin  (HCC) Hemoglobin A1c elevated at 11.3.  Will start Lantus this evening.  On Jardiance and metformin  at home.  Chronic heart failure with reduced ejection fraction (HFrEF, <= 40%) (HCC) Currently euvolemic.  Patient on Entresto  and Jardiance.  Obesity (BMI 30-39.9) Class I obesity with a BMI of 34.17  Hyperlipidemia, unspecified On Crestor  and Zetia         Subjective: Patient came in with severe pain found to have an obstructing kidney stone.  This morning felt okay.  To the operating room today by Dr. Twylla  Physical Exam: Vitals:   08/18/24 0423 08/18/24 0741  08/18/24 0750 08/18/24 1207  BP: 94/66 (!) 85/54 (!) 116/95 93/65  Pulse: 74 78 74 77  Resp: 18 18  16   Temp: 98.6 F (37 C) 98.3 F (36.8 C)  (!) 97 F (36.1 C)  TempSrc:  Oral  Temporal  SpO2: 94% 96% 93% 94%  Weight:      Height:       Physical Exam HENT:     Head: Normocephalic.     Mouth/Throat:     Pharynx: No oropharyngeal exudate.  Eyes:     General: Lids are normal.     Conjunctiva/sclera: Conjunctivae normal.  Cardiovascular:     Rate and Rhythm: Normal rate and regular rhythm.     Heart sounds: Normal heart sounds, S1 normal and S2 normal.  Pulmonary:     Breath sounds: No decreased breath sounds, wheezing, rhonchi or rales.  Abdominal:     Palpations: Abdomen is soft.     Tenderness: There is no abdominal tenderness.  Musculoskeletal:     Right lower leg: No swelling.     Left lower leg: No swelling.  Skin:    General: Skin is warm.     Findings: No rash.  Neurological:     Mental Status: He is alert and oriented to person, place, and time.     Data Reviewed: Creatinine 1.56, glucose 279, electrolytes normal range, platelet count 147, hemoglobin 13.8, white blood cell count 8.9   Disposition: Status is: Inpatient Remains inpatient appropriate because: Operating room today  Planned Discharge Destination: Home    Time spent: 28 minutes  Author: Charlie Patterson, MD 08/18/2024 1:29 PM  For on call review [email or web address].  "

## 2024-08-18 NOTE — Hospital Course (Signed)
 63 y.o. male with medical history significant of CAD, ischemic cardiomyopathy, chronic HFrEF with LVEF 30%, AICD, IDDM, presented with sudden onset of left-sided flank pain.   Last night patient suddenly woke up with 10/10 sharp like left flank pain radiating to left groin area, he denies any dysuria, no fever or chills.  No diarrhea.  Denies chest pain shortness of breath.  He had similar pain before when he had kidney stone in 2018. ED Course: Afebrile, nontachycardic blood pressure 135/85 O2 saturation 95% on room air.  CT abdominal pelvis showed left obstructing ureteral stone with moderate hydronephrosis.  UA showed 2+ WBC and 3+ RBC, blood work showed WBC 12.5 hemoglobin 15.5 BUN 19 creatinine 1.2 glucose 369.  12/31.  Patient felt better prior to procedure this morning.  Diagnosed with obstructing kidney stone.

## 2024-08-18 NOTE — Inpatient Diabetes Management (Addendum)
 Inpatient Diabetes Program Recommendations  AACE/ADA: New Consensus Statement on Inpatient Glycemic Control (2015)  Target Ranges:  Prepandial:   less than 140 mg/dL      Peak postprandial:   less than 180 mg/dL (1-2 hours)      Critically ill patients:  140 - 180 mg/dL    Latest Reference Range & Units 08/17/24 09:28  Hemoglobin A1C 4.8 - 5.6 % 11.3 (H)  277 mg/dl  (H): Data is abnormally high   Latest Reference Range & Units 08/17/24 09:26 08/17/24 12:31 08/17/24 16:48 08/17/24 20:29 08/18/24 07:40  Glucose-Capillary 70 - 99 mg/dL 707 (H)  10 units Novolog   205 (H)  5 units Novolog   213 (H)  5 units Novolog   195 (H) 257 (H)  (H): Data is abnormally high    Admit with: Acute unilateral obstructive uropathy/ Complicated UTI with hematuria   History: DM2  Home DM Meds: Jardiance 10 mg daily        Metformin  1000 mg AM/ 500 mg PM  Current Orders: Novolog  Moderate Correction Scale/ SSI (0-15 units) TID AC    NPO this AM for left ureteroscopy with laser lithotripsy and stent placement    MD- Note CBG 257 this AM  Please consider starting weight based basal insulin :  Lantus 10 units daily (~0.1 units/kg)  Current A1c shows poor glucose control at home  Met w/ pt at bedside this AM prior to procedure.  Mom and friend at bedside.  Pt told me he has been eating very poorly over the holidays.  Admits to drinking 6 or more cans of regular coke per day, apple cider, and other sugary beverages.  Has also been eating lots of cake and other sweets over the holidays.  Confirmed with me that he did visit the providers at [facility] and they gave him Rxs to start Jardiance and Metformin  at home about 2 weeks ago.  Per pt report, he has started the meds but has had a poor diet.  States he has a CBG meter at home but can't find it.    Spoke with patient about his current A1c of 11.3%.  Explained what an A1c is and what it measures.  Reminded patient that his goal A1c is 7% or less per  ADA standards to prevent both acute and long-term complications.  Explained to patient the extreme importance of good glucose control at home.  Encouraged patient to check his CBGs at least BID at home (fasting and another check within the day) and to record all CBGs in a logbook for his PCP to review.  Reviewed with pt the long term and short term effects of hyperglycemia, Reviewed goal CBGs for home.  Also discussed DM diet information with patient.  Encouraged patient to avoid beverages with sugar (regular soda, sweet tea, lemonade, fruit juice) and to consume mostly water.  Discussed what foods contain carbohydrates and how carbohydrates affect the body's blood sugar levels.  Encouraged patient to be careful with his portion sizes (especially grains, starchy vegetables, and fruits).  Noticed that pt has a large bag of peanut m&ms in his bed.  I asked him about the candy and his visitor spoke up and told me she brought them to him and didn't realize he shouldn't have candy--visit took the candy and put it in her bag.   --Will follow patient during hospitalization--  Adina Rudolpho Arrow RN, MSN, CDCES Diabetes Coordinator Inpatient Glycemic Control Team Team Pager: [phone] (8a-5p)

## 2024-08-19 ENCOUNTER — Encounter: Payer: Self-pay | Admitting: Urology

## 2024-08-19 DIAGNOSIS — N139 Obstructive and reflux uropathy, unspecified: Secondary | ICD-10-CM | POA: Diagnosis not present

## 2024-08-19 DIAGNOSIS — N179 Acute kidney failure, unspecified: Secondary | ICD-10-CM | POA: Diagnosis not present

## 2024-08-19 DIAGNOSIS — E1165 Type 2 diabetes mellitus with hyperglycemia: Secondary | ICD-10-CM | POA: Diagnosis not present

## 2024-08-19 DIAGNOSIS — I5022 Chronic systolic (congestive) heart failure: Secondary | ICD-10-CM | POA: Diagnosis not present

## 2024-08-19 LAB — GLUCOSE, CAPILLARY: Glucose-Capillary: 237 mg/dL — ABNORMAL HIGH (ref 70–99)

## 2024-08-19 NOTE — Discharge Summary (Signed)
 " Physician Discharge Summary   Patient: Seth Riddle MRN: [id number] DOB: [birth year]  Admit date:     08/17/2024  Discharge date: 08/19/2024  Discharge Physician: Charlie Patterson   PCP: Pcp, No   Recommendations at discharge:   Schedule yourself with a primary care physician.  Names and numbers given. Follow-up Dr. Twylla urology 1 week or so  Discharge Diagnoses: Principal Problem:   Acute unilateral obstructive uropathy Active Problems:   AKI (acute kidney injury)   Uncontrolled type 2 diabetes mellitus with hyperglycemia, without long-term current use of insulin  (HCC)   Chronic heart failure with reduced ejection fraction (HFrEF, <= 40%) (HCC)   Obesity (BMI 30-39.9)   Hyperlipidemia, unspecified    Hospital Course: 64 y.o. male with medical history significant of CAD, ischemic cardiomyopathy, chronic HFrEF with LVEF 30%, AICD, IDDM, presented with sudden onset of left-sided flank pain.   Last night patient suddenly woke up with 10/10 sharp like left flank pain radiating to left groin area, he denies any dysuria, no fever or chills.  No diarrhea.  Denies chest pain shortness of breath.  He had similar pain before when he had kidney stone in 2018. ED Course: Afebrile, nontachycardic blood pressure 135/85 O2 saturation 95% on room air.  CT abdominal pelvis showed left obstructing ureteral stone with moderate hydronephrosis.  UA showed 2+ WBC and 3+ RBC, blood work showed WBC 12.5 hemoglobin 15.5 BUN 19 creatinine 1.2 glucose 369.  12/31.  Patient felt better prior to procedure this morning.  Diagnosed with obstructing kidney stone.  Dr. Twylla took to the operating room for cystoscopy, left ureteroscopy and stone removal, ureteroscopic laser lithotripsy, left ureteral stent and left retrograde pyelogram with interpretation. 08/19/2024.  Patient still has a little abdominal pain has a little darker urine and some burning with urination.  Urine culture showing no growth but  prescribed antibiotics upon discharge.  Assessment and Plan: * Acute unilateral obstructive uropathy Patient given empiric Rocephin  here will switch over to Keflex upon going home.  Flomax  daily and as needed Ditropan  for bladder spasms.  AKI (acute kidney injury) Likely secondary to obstructing stone.  Follow-up as outpatient.  Uncontrolled type 2 diabetes mellitus with hyperglycemia, without long-term current use of insulin  (HCC) Hemoglobin A1c elevated at 11.3.  Continue low-dose Lantus in the evening.  On Jardiance and metformin  at home.  Patient will cut out sodas and I think his sugars will improve.  Chronic heart failure with reduced ejection fraction (HFrEF, <= 40%) (HCC) Currently euvolemic.  Patient on Entresto  and Jardiance.  Obesity (BMI 30-39.9) Class I obesity with a BMI of 34.17  Hyperlipidemia, unspecified On Crestor  and Zetia          Consultants: Urology Procedures performed: Cystoscopy, stent placement and lithotripsy with stone extraction. Disposition: Home Diet recommendation:  Cardiac and Carb modified diet DISCHARGE MEDICATION: Allergies as of 08/19/2024       Reactions   Coconut (cocos Nucifera) Hives        Medication List     TAKE these medications    aspirin  EC 81 MG tablet Take 1 tablet (81 mg total) by mouth daily.   cephALEXin 500 MG capsule Commonly known as: KEFLEX Take 1 capsule (500 mg total) by mouth 3 (three) times daily for 7 days.   Entresto  24-26 MG Generic drug: sacubitril -valsartan  Take 1 tablet by mouth 2 (two) times daily.   ezetimibe  10 MG tablet Commonly known as: ZETIA  TAKE 1 TABLET BY MOUTH EVERY DAY   insulin  glargine  100 UNIT/ML Solostar Pen Commonly known as: LANTUS Inject 10 Units into the skin at bedtime.   Insulin  Pen Needle 32G X 4 MM Misc 1 Dose by Does not apply route daily.   Jardiance 10 MG Tabs tablet Generic drug: empagliflozin Take 10 mg by mouth daily.   metFORMIN  500 MG 24 hr  tablet Commonly known as: GLUCOPHAGE -XR Take 1-2 tablets by mouth 2 (two) times daily. Take 2 tablet by mouth in the morning and 1 tablet by mouth in the evening   oxybutynin  5 MG tablet Commonly known as: DITROPAN  1 tab tid prn frequency,urgency, bladder spasm   rosuvastatin  40 MG tablet Commonly known as: CRESTOR  TAKE 1 TABLET BY MOUTH EVERY DAY   tamsulosin  0.4 MG Caps capsule Commonly known as: FLOMAX  Take 1 capsule (0.4 mg total) by mouth daily after breakfast.        Follow-up Information     Stoioff, Scott C, MD Follow up in 1 week(s).   Specialty: Urology Contact information: [address] [place] [address] [phone]         set up with a medical doctor Follow up in 2 week(s).                 Discharge Exam: Filed Weights   08/17/24 1733  Weight: 111.1 kg   Physical Exam HENT:     Head: Normocephalic.     Mouth/Throat:     Pharynx: No oropharyngeal exudate.  Eyes:     General: Lids are normal.     Conjunctiva/sclera: Conjunctivae normal.  Cardiovascular:     Rate and Rhythm: Normal rate and regular rhythm.     Heart sounds: Normal heart sounds, S1 normal and S2 normal.  Pulmonary:     Breath sounds: No decreased breath sounds, wheezing, rhonchi or rales.  Abdominal:     Palpations: Abdomen is soft.     Tenderness: There is no abdominal tenderness.  Musculoskeletal:     Right lower leg: No swelling.     Left lower leg: No swelling.  Skin:    General: Skin is warm.     Findings: No rash.  Neurological:     Mental Status: He is alert and oriented to person, place, and time.      Condition at discharge: stable  The results of significant diagnostics from this hospitalization (including imaging, microbiology, ancillary and laboratory) are listed below for reference.   Imaging Studies: DG OR UROLOGY CYSTO IMAGE ([facility] ONLY) Result Date: 08/18/2024 There is no interpretation for this exam.  This order is for  images obtained during a surgical procedure.  Please See Surgeries Tab for more information regarding the procedure.   DG Chest 1 View Result Date: 08/17/2024 EXAM: PA AND LATERAL (2) VIEW(S) XRAY OF THE CHEST 08/17/2024 08:00:00 AM COMPARISON: PA and lateral radiographs of the chest dated 12/03/2022. CLINICAL HISTORY: CHF (congestive heart failure) (HCC) FINDINGS: LINES, TUBES AND DEVICES: Left chest AICD in place. LUNGS AND PLEURA: No focal pulmonary opacity. No pleural effusion. No pneumothorax. HEART AND MEDIASTINUM: Mild cardiomegaly. CABG markers noted. Left chest AICD in place. BONES AND SOFT TISSUES: Nondisplaced median sternotomy wires. No acute osseous abnormality. IMPRESSION: 1. Mild cardiomegaly with left chest AICD in place, CABG markers noted, and nondisplaced median sternotomy wires. Electronically signed by: Evalene Coho MD 08/17/2024 08:20 AM EST RP Workstation: [id number]   CT Renal Stone Study Result Date: 08/17/2024 EXAM: CT UROGRAM 08/17/2024 03:10:20 AM TECHNIQUE: CT of the abdomen and pelvis  was performed without the administration of intravenous contrast as per CT urogram protocol. Multiplanar reformatted images as well as MIP urogram images are provided for review. Automated exposure control, iterative reconstruction, and/or weight based adjustment of the mA/kV was utilized to reduce the radiation dose to as low as reasonably achievable. COMPARISON: 01/15/2017 CLINICAL HISTORY: Left flank pain, nephrolithiasis. FINDINGS: LOWER CHEST: Cardiac pacer leads noted. LIVER: The liver is unremarkable. GALLBLADDER AND BILE DUCTS: Cholelithiasis without superimposed pericholecystic inflammatory change. No intra or extrahepatic biliary ductal dilation. SPLEEN: No acute abnormality. PANCREAS: No acute abnormality. ADRENAL GLANDS: No acute abnormality. KIDNEYS, URETERS AND BLADDER: The kidneys are normal in size and position. Moderate left hydronephrosis and marked perinephric stranding  secondary to an obstructing 7 x 11 mm calculus at the left ureterovesicular junction protruding into the lateral lumen. Extensive perinephric stranding may relate to pyelonephritis or rupture, superimposed infection could appear similarly and correlation with urinalysis and urine culture is recommended for further management. Superimposed 3 mm nonobstructing calculus within the lower pole of the left kidney. Conglomerate nonobstructing calculi within the lower pole of the right kidney measuring up to 14 mm. No ureteral calculi in the right. No hydronephrosis on the right. The bladder is otherwise unremarkable. GI AND BOWEL: Mild sigmoid diverticulosis without superimposed acute inflammatory change. The stomach, small bowel, and large bowel are otherwise unremarkable. Appendix normal. PERITONEUM AND RETROPERITONEUM: No ascites. No free air. VASCULATURE: Mild aortoiliac atherosclerotic calcification. No aortic aneurysm. LYMPH NODES: No lymphadenopathy. REPRODUCTIVE ORGANS: No acute abnormality. BONES AND SOFT TISSUES: Osseous structures are age appropriate. No acute bone abnormality. No lytic or blastic bone lesion. No focal soft tissue abnormality. IMPRESSION: 1. Obstructing 7 x 11 mm calculus at the left ureterovesicular junction causing moderate left hydronephrosis and marked perinephric stranding, possibly related to forniceal rupture or superimposed infection; recommend urinalysis and urine culture for further management. 2. Nonobstructing 3 mm calculus within the lower pole of the left kidney. 3. Conglomerate nonobstructing calculi within the lower pole of the right kidney measuring up to 14 mm, without right hydronephrosis or right ureteral calculi. 4. Cholelithiasis. 5. Mild sigmoid diverticulosis. Electronically signed by: Dorethia Molt MD 08/17/2024 04:38 AM EST RP Workstation: [id number]    Microbiology: Results for orders placed or performed during the hospital encounter of 08/17/24  Urine Culture  (for pregnant, neutropenic or urologic patients or patients with an indwelling urinary catheter)     Status: None   Collection Time: 08/17/24  2:31 AM   Specimen: Urine, Clean Catch  Result Value Ref Range Status   Specimen Description   Final    URINE, CLEAN CATCH Performed at [facility], [address], [place], [place] [address]    Special Requests   Final    NONE Performed at [facility], [address], [place], [place] [address]    Culture   Final    NO GROWTH Performed at [facility], [address], [place], [place] [address]    Report Status 08/18/2024 FINAL  Final    Labs: CBC: Recent Labs  Lab 08/17/24 0231 08/18/24 0548  WBC 12.5* 8.9  NEUTROABS 10.3*  --   HGB 15.5 13.8  HCT 43.7 40.6  MCV 86.0 89.0  PLT 160 147*   Basic Metabolic Panel: Recent Labs  Lab 08/17/24 0231 08/18/24 0548  NA 134* 135  K 4.1 4.1  CL 97* 100  CO2 24 26  GLUCOSE 369* 279*  BUN 19 23  CREATININE 1.22 1.56*  CALCIUM   9.9 9.2   Liver Function Tests: No results for input(s): AST, ALT, ALKPHOS, BILITOT, PROT, ALBUMIN  in the last 168 hours. CBG: Recent Labs  Lab 08/18/24 1142 08/18/24 1357 08/18/24 1657 08/18/24 2144 08/19/24 0727  GLUCAP 201* 173* 166* 250* 237*    Discharge time spent: greater than 30 minutes.  Signed: Charlie Patterson, MD [facility] 08/19/2024 "

## 2024-08-20 NOTE — Inpatient Diabetes Management (Signed)
 Received Secure Chat from RN on nursing unit that pt was discharged home new to Insulin  pen 08/20/2023 and was not shown how to use the insulin  pen.  Pt had questions about the pen.  I called pt by phone today and verbally reviewed with him how to use an insulin  pen at home, importance of timing of the Lantus insulin , storage of insulin , etc.  Verbally Reviewed all steps of insulin  pen including attachment of needle, 2-unit air shot, dialing up dose, giving injection, rotation of injection sites, removing needle, disposal of sharps, storage of unused insulin , disposal of insulin  etc.  Patient stated comprehension and very thankful for call.  Reviewed troubleshooting with insulin  pen.  Also reviewed Signs/Symptoms of Hypoglycemia with patient and how to treat Hypoglycemia at home.  Also emailed 2 educational sources to pt (one pdf written document and one video on step by step use of insulin  pen) to pt.     Adina Rudolpho Arrow RN, MSN, CDCES Diabetes Coordinator Inpatient Glycemic Control Team Team Pager: [phone] (8a-5p)

## 2024-08-25 ENCOUNTER — Telehealth: Payer: Self-pay | Admitting: Cardiology

## 2024-08-25 ENCOUNTER — Encounter: Payer: Self-pay | Admitting: Emergency Medicine

## 2024-08-25 NOTE — Telephone Encounter (Signed)
 After 3 failed attempts at trying to reach patient, certified letter mailed to patient in hopes he will call to schedule.

## 2024-08-25 NOTE — Telephone Encounter (Signed)
-----   Message from [place] sent at 08/16/2024  2:30 PM EST ----- Call x3; lvmtcb to schedule patient from recall w/ EP APP in [place] for overdue f/u (was due 02/2024), was lost to f/u after cancellation of remotes 05/2023 ----- Message ----- From: Shona Charlott HERO Sent: 07/14/2024  10:37 AM EST To: Charlott HERO Shona  Call x2; lvmtcb to schedule patient from recall w/ EP APP in [place] for overdue f/u (was due 02/2024), was lost to f/u after cancellation of remotes 05/2023 ----- Message ----- From: Shona Charlott HERO Sent: 07/09/2024  12:02 PM EST To: Charlott HERO Shona  Call x1; lvmtcb to schedule patient from recall w/ EP APP in [place] for overdue f/u (was due 02/2024), was lost to f/u after cancellation of remotes 05/2023 ----- Message ----- From: Lesia Ozell Barter, PA-C Sent: 06/29/2024   1:36 PM EST To: Charlott HERO Shona   Afternoon!  This pt cancelled his remotes 05/2023 and has been lost to follow up.     Can we see if he still intends to follow with us , and schedule him to be seen in [place] if so?  Thank you!

## 2024-08-28 LAB — STONE ANALYSIS
Calcium Oxalate Dihydrate: 10 %
Calcium Oxalate Monohydrate: 90 %
Weight Calculi: 25 mg

## 2024-08-31 ENCOUNTER — Encounter: Admitting: Urology

## 2024-09-03 ENCOUNTER — Encounter: Admitting: Urology
